# Patient Record
Sex: Male | Born: 1960 | Race: Black or African American | Hispanic: No | Marital: Married | Smoking: Never smoker
Health system: Southern US, Community
[De-identification: ages and names within clinical notes are randomized; demographics above are authoritative.]

## PROBLEM LIST (undated history)

## (undated) DIAGNOSIS — F419 Anxiety disorder, unspecified: Secondary | ICD-10-CM

## (undated) DIAGNOSIS — I10 Essential (primary) hypertension: Secondary | ICD-10-CM

## (undated) DIAGNOSIS — E669 Obesity, unspecified: Secondary | ICD-10-CM

## (undated) DIAGNOSIS — E785 Hyperlipidemia, unspecified: Secondary | ICD-10-CM

## (undated) DIAGNOSIS — E119 Type 2 diabetes mellitus without complications: Secondary | ICD-10-CM

## (undated) DIAGNOSIS — E291 Testicular hypofunction: Secondary | ICD-10-CM

## (undated) DIAGNOSIS — F329 Major depressive disorder, single episode, unspecified: Secondary | ICD-10-CM

## (undated) HISTORY — PX: HERNIA REPAIR: SHX51

## (undated) HISTORY — DX: Hyperlipidemia, unspecified: E78.5

## (undated) HISTORY — DX: Testicular hypofunction: E29.1

## (undated) HISTORY — DX: Essential (primary) hypertension: I10

## (undated) HISTORY — DX: Anxiety disorder, unspecified: F41.9

## (undated) HISTORY — DX: Major depressive disorder, single episode, unspecified: F32.9

## (undated) HISTORY — PX: VASECTOMY: SHX75

---
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
## 2001-06-22 ENCOUNTER — Emergency Department (HOSPITAL_COMMUNITY): Admission: EM | Admit: 2001-06-22 | Discharge: 2001-06-22 | Payer: Self-pay | Admitting: Emergency Medicine

## 2001-07-15 ENCOUNTER — Emergency Department (HOSPITAL_COMMUNITY): Admission: EM | Admit: 2001-07-15 | Discharge: 2001-07-15 | Payer: Self-pay | Admitting: *Deleted

## 2001-07-15 ENCOUNTER — Encounter: Payer: Self-pay | Admitting: *Deleted

## 2002-10-01 ENCOUNTER — Emergency Department (HOSPITAL_COMMUNITY): Admission: EM | Admit: 2002-10-01 | Discharge: 2002-10-01 | Payer: Self-pay | Admitting: Emergency Medicine

## 2003-01-02 ENCOUNTER — Encounter: Payer: Self-pay | Admitting: Emergency Medicine

## 2003-01-02 ENCOUNTER — Emergency Department (HOSPITAL_COMMUNITY): Admission: EM | Admit: 2003-01-02 | Discharge: 2003-01-02 | Payer: Self-pay | Admitting: Emergency Medicine

## 2003-02-26 ENCOUNTER — Emergency Department (HOSPITAL_COMMUNITY): Admission: EM | Admit: 2003-02-26 | Discharge: 2003-02-26 | Payer: Self-pay | Admitting: Emergency Medicine

## 2003-04-08 ENCOUNTER — Ambulatory Visit (HOSPITAL_COMMUNITY): Admission: RE | Admit: 2003-04-08 | Discharge: 2003-04-08 | Payer: Self-pay | Admitting: General Surgery

## 2004-07-09 ENCOUNTER — Ambulatory Visit (HOSPITAL_COMMUNITY): Admission: RE | Admit: 2004-07-09 | Discharge: 2004-07-09 | Payer: Self-pay | Admitting: Family Medicine

## 2005-06-20 DIAGNOSIS — F32A Depression, unspecified: Secondary | ICD-10-CM

## 2005-06-20 HISTORY — DX: Depression, unspecified: F32.A

## 2005-11-18 ENCOUNTER — Emergency Department (HOSPITAL_COMMUNITY): Admission: EM | Admit: 2005-11-18 | Discharge: 2005-11-18 | Payer: Self-pay | Admitting: Emergency Medicine

## 2005-11-21 ENCOUNTER — Emergency Department (HOSPITAL_COMMUNITY): Admission: EM | Admit: 2005-11-21 | Discharge: 2005-11-21 | Payer: Self-pay | Admitting: Emergency Medicine

## 2006-03-24 ENCOUNTER — Emergency Department (HOSPITAL_COMMUNITY): Admission: EM | Admit: 2006-03-24 | Discharge: 2006-03-24 | Payer: Self-pay | Admitting: Emergency Medicine

## 2008-08-21 ENCOUNTER — Emergency Department (HOSPITAL_COMMUNITY): Admission: EM | Admit: 2008-08-21 | Discharge: 2008-08-22 | Payer: Self-pay | Admitting: Emergency Medicine

## 2009-08-18 ENCOUNTER — Ambulatory Visit: Payer: Self-pay | Admitting: Family Medicine

## 2009-08-18 DIAGNOSIS — F418 Other specified anxiety disorders: Secondary | ICD-10-CM | POA: Insufficient documentation

## 2009-08-18 DIAGNOSIS — F324 Major depressive disorder, single episode, in partial remission: Secondary | ICD-10-CM | POA: Insufficient documentation

## 2009-08-18 DIAGNOSIS — E782 Mixed hyperlipidemia: Secondary | ICD-10-CM | POA: Insufficient documentation

## 2009-08-18 DIAGNOSIS — I10 Essential (primary) hypertension: Secondary | ICD-10-CM | POA: Insufficient documentation

## 2009-09-14 ENCOUNTER — Encounter: Payer: Self-pay | Admitting: Physician Assistant

## 2009-09-14 LAB — CONVERTED CEMR LAB
ALT: 44 units/L (ref 0–53)
AST: 31 units/L (ref 0–37)
Albumin: 4.2 g/dL (ref 3.5–5.2)
Alkaline Phosphatase: 101 units/L (ref 39–117)
BUN: 11 mg/dL (ref 6–23)
CO2: 26 meq/L (ref 19–32)
Calcium: 9.3 mg/dL (ref 8.4–10.5)
Chloride: 103 meq/L (ref 96–112)
Cholesterol: 200 mg/dL (ref 0–200)
Creatinine, Ser: 1.05 mg/dL (ref 0.40–1.50)
Glucose, Bld: 112 mg/dL — ABNORMAL HIGH (ref 70–99)
HCT: 41.5 % (ref 39.0–52.0)
HDL: 36 mg/dL — ABNORMAL LOW (ref >39)
Hemoglobin: 13.6 g/dL (ref 13.0–17.0)
LDL Cholesterol: 150 mg/dL — ABNORMAL HIGH (ref 0–99)
MCHC: 32.8 g/dL (ref 30.0–36.0)
MCV: 94.7 fL (ref 78.0–100.0)
PSA: 0.56 ng/mL (ref 0.10–4.00)
PSA: NORMAL ng/mL
Platelets: 322 10*3/uL (ref 150–400)
Potassium: 4.3 meq/L (ref 3.5–5.3)
RBC: 4.38 M/uL (ref 4.22–5.81)
RDW: 12.9 % (ref 11.5–15.5)
Sex Hormone Binding: 25 nmol/L (ref 13–71)
Sodium: 139 meq/L (ref 135–145)
TSH: 2.406 microintl units/mL (ref 0.350–4.500)
Testosterone Free: 84.5 pg/mL (ref 47.0–244.0)
Testosterone-% Free: 2.3 % (ref 1.6–2.9)
Testosterone: 361.66 ng/dL (ref 350–890)
Total Bilirubin: 0.6 mg/dL (ref 0.3–1.2)
Total CHOL/HDL Ratio: 5.6
Total Protein: 7.5 g/dL (ref 6.0–8.3)
Triglycerides: 68 mg/dL (ref <150)
VLDL: 14 mg/dL (ref 0–40)
Vit D, 25-Hydroxy: 13 ng/mL — ABNORMAL LOW (ref 30–89)
WBC: 6.4 10*3/uL (ref 4.0–10.5)

## 2009-09-15 ENCOUNTER — Ambulatory Visit: Payer: Self-pay | Admitting: Family Medicine

## 2009-09-15 DIAGNOSIS — E1165 Type 2 diabetes mellitus with hyperglycemia: Secondary | ICD-10-CM | POA: Insufficient documentation

## 2009-09-15 DIAGNOSIS — E669 Obesity, unspecified: Secondary | ICD-10-CM

## 2009-09-15 DIAGNOSIS — E1169 Type 2 diabetes mellitus with other specified complication: Secondary | ICD-10-CM

## 2009-09-15 DIAGNOSIS — E291 Testicular hypofunction: Secondary | ICD-10-CM

## 2009-12-14 ENCOUNTER — Encounter: Payer: Self-pay | Admitting: Physician Assistant

## 2009-12-14 LAB — CONVERTED CEMR LAB
Hgb A1c MFr Bld: 5.3 % (ref <5.7)
Testosterone: 343.29 ng/dL — ABNORMAL LOW (ref 350–890)

## 2009-12-16 ENCOUNTER — Ambulatory Visit: Payer: Self-pay | Admitting: Family Medicine

## 2009-12-16 LAB — CONVERTED CEMR LAB
HDL: 41 mg/dL (ref >39)
LDL Cholesterol: 171 mg/dL — ABNORMAL HIGH (ref 0–99)
Total CHOL/HDL Ratio: 5.8
Triglycerides: 128 mg/dL (ref <150)

## 2010-02-15 ENCOUNTER — Ambulatory Visit: Payer: Self-pay | Admitting: Family Medicine

## 2010-03-18 ENCOUNTER — Ambulatory Visit: Payer: Self-pay | Admitting: Family Medicine

## 2010-03-19 LAB — CONVERTED CEMR LAB
AST: 24 units/L (ref 0–37)
CO2: 25 meq/L (ref 19–32)
Calcium: 9.4 mg/dL (ref 8.4–10.5)
Creatinine, Ser: 1.01 mg/dL (ref 0.40–1.50)
Glucose, Bld: 118 mg/dL — ABNORMAL HIGH (ref 70–99)
HDL: 44 mg/dL (ref >39)
Sodium: 140 meq/L (ref 135–145)
Total Bilirubin: 0.6 mg/dL (ref 0.3–1.2)
Triglycerides: 75 mg/dL (ref <150)

## 2010-07-02 ENCOUNTER — Ambulatory Visit
Admission: RE | Admit: 2010-07-02 | Discharge: 2010-07-02 | Payer: Self-pay | Source: Home / Self Care | Attending: Family Medicine | Admitting: Family Medicine

## 2010-07-02 ENCOUNTER — Encounter: Payer: Self-pay | Admitting: Family Medicine

## 2010-07-20 NOTE — Assessment & Plan Note (Signed)
Summary: follow up - room 2   Vital Signs:  Patient profile:   50 year old male Height:      68 inches Weight:      189.25 pounds BMI:     28.88 O2 Sat:      100 % on Room air Pulse rate:   98 / minute Resp:     16 per minute BP sitting:   136 / 80  (left arm)  Vitals Entered By: Adella Hare LPN (December 16, 2009 9:08 AM)  Serial Vital Signs/Assessments:  Time      Position  BP       Pulse  Resp  Temp     By                     154/92                         Esperanza Sheets PA  CC: follow-up visit Is Patient Diabetic? No Pain Assessment Patient in pain? no        CC:  follow-up visit.  History of Present Illness: Pt is here today for a check up and to discuss recent lab test results.  Hx of depression.  He feels that it has worsened in the last 2-3 weeks.  Feeling sad and no interest or energy to do anything.  Not sleeping well.  With Xanax only sleeps about 3 hrs. He states he was on a higher dose in the past & could sleep about 7 hrs.  No SI or HI thoughts.  Hx of hypogonadism. Was not able to start testosterone prescription due to expense with copay.  Hx of hyperlipidemia and BP elevation.  He has been trying to eat healthier and exercise more regularly.  He does admit to eating alot of fried fish.     Current Medications (verified): 1)  Vitamin D (Ergocalciferol) 50000 Unit Caps (Ergocalciferol) .... Take 1 Weekly 2)  Testim 1 % Gel (Testosterone) .... Apply 5 Grams ( 1 Pkt) Once Daily As Directed. 3)  Alprazolam 0.5 Mg Tabs (Alprazolam) .... Take 1 Tab At [facility] As Needed For Anxiety & Insomnia 4)  Effexor Xr 150 Mg Xr24h-Cap (Venlafaxine Hcl) .... Take 1 Daily  Allergies (verified): No Known Drug Allergies  Past History:  Past medical history reviewed for relevance to current acute and chronic problems.  Past Medical History: Depression Hyperlipidemia Hypertension Hypogonadism  Review of Systems General:  Complains of fatigue. CV:  Denies chest pain or  discomfort and palpitations. Resp:  Denies cough and shortness of breath. GU:  Complains of decreased libido. Psych:  Complains of depression; denies anxiety, suicidal thoughts/plans, and thoughts /plans of harming others.  Physical Exam  General:  Well-developed,well-nourished,in no acute distress; alert,appropriate and cooperative throughout examination Head:  Normocephalic and atraumatic without obvious abnormalities. No apparent alopecia or balding. Ears:  External ear exam shows no significant lesions or deformities.  Otoscopic examination reveals clear canals, tympanic membranes are intact bilaterally without bulging, retraction, inflammation or discharge. Hearing is grossly normal bilaterally. Nose:  External nasal examination shows no deformity or inflammation. Nasal mucosa are pink and moist without lesions or exudates. Mouth:  Oral mucosa and oropharynx without lesions or exudates.  poor dentition and teeth missing.   Neck:  No deformities, masses, or tenderness noted. Lungs:  Normal respiratory effort, chest expands symmetrically. Lungs are clear to auscultation, no crackles or wheezes. Heart:  Normal rate and regular rhythm.  S1 and S2 normal without gallop, murmur, click, rub or other extra sounds. Cervical Nodes:  No lymphadenopathy noted Psych:  Cognition and judgment appear intact. Alert and cooperative with normal attention span and concentration. No apparent delusions, illusions, hallucinations   Impression & Recommendations:  Problem # 1:  DEPRESSION (ICD-311) Assessment Deteriorated  The following medications were removed from the medication list:    Alprazolam 0.5 Mg Tabs (Alprazolam) .[name]... Take 1 tab at hs as needed for anxiety & insomnia    Effexor Xr 150 Mg Xr24h-cap (Venlafaxine hcl) .[name]... Take 1 daily His updated medication list for this problem includes:    Effexor Xr 75 Mg Xr24h-cap (Venlafaxine hcl) .[name]... Take 3 daily for depression    Alprazolam 1 Mg Tabs  (Alprazolam) .[name]... Take 1 at bedime  Problem # 2:  HYPOGONADISM (ICD-257.2) Assessment: Unchanged  Orders: T-Testosterone; Total [phone])  Problem # 3:  IMPAIRED FASTING GLUCOSE (ICD-790.21) Assessment: Improved  Problem # 4:  HYPERTENSION (ICD-401.9) Assessment: Comment Only  Orders: T-Comprehensive Metabolic Panel ([id number])  BP today: 136/80 Prior BP: 140/82 (09/15/2009)  Labs Reviewed: K+: 4.3 (09/14/2009) Creat: : 1.05 (09/14/2009)   Chol: 200 (09/14/2009)   HDL: 36 (09/14/2009)   LDL: 150 (09/14/2009)   TG: 68 (09/14/2009)  Problem # 5:  HYPERLIPIDEMIA (ICD-272.4) Assessment: Deteriorated  His updated medication list for this problem includes:    Simvastatin 20 Mg Tabs (Simvastatin) .[name]... Take 1 at bedtime for cholesterol  Orders: T-Lipid Profile [phone]) T-Comprehensive Metabolic Panel [phone])  Labs Reviewed: SGOT: 31 (09/14/2009)   SGPT: 44 (09/14/2009)   HDL:36 (09/14/2009)  LDL:150 (09/14/2009)  Chol:200 (09/14/2009)  Trig:68 (09/14/2009)  Complete Medication List: 1)  Simvastatin 20 Mg Tabs (Simvastatin) .... Take 1 at bedtime for cholesterol 2)  Effexor Xr 75 Mg Xr24h-cap (Venlafaxine hcl) .... Take 3 daily for depression 3)  Alprazolam 1 Mg Tabs (Alprazolam) .... Take 1 at bedime 4)  Depo-testosterone 100 Mg/ml Oil (Testosterone cypionate) .... Inject every two to four weeks as directed  Patient Instructions: 1)  Please schedule a follow-up appointment in 2 months. 2)  I have increased your Effexor XR dose to a total of 225 mg a day.  You will need to take 3 of the 75 mg tabs as prescribed. 3)  I have started you on Simvastatin for cholesterol.  Continue to follow a low fat diet. 4)  I have prescribed Depo Testosterone.  You may come to the office with the medication for an injection every 2-4 weeks as discussed. 5)  You will need to have lab work done in 3 months fasting. 6)  You may start over the counter Vitamin D [phone]  international units once daily. Prescriptions: DEPO-TESTOSTERONE 100 MG/ML OIL (TESTOSTERONE CYPIONATE) inject every two to four weeks as directed  #1 vial x 1   Entered by:   Adella Hare LPN   Authorized by:   Esperanza Sheets PA   Signed by:   Adella Hare LPN on 28/41/3244   Method used:   Print then Give to Patient   RxID:   [id number] ALPRAZOLAM 1 MG TABS (ALPRAZOLAM) take 1 at bedime  #30 x 2   Entered and Authorized by:   Esperanza Sheets PA   Signed by:   Esperanza Sheets PA on 12/16/2009   Method used:   Printed then faxed to ...       [facility] [address] ([facility]ail)       [address]       [place]  [place], [place]  [address]       Ph: [phone]       Fax: [phone]   RxID:   [id number] EFFEXOR XR 75 MG XR24H-CAP (VENLAFAXINE HCL) take 3 daily for depression  #90 x 3   Entered and Authorized by:   Esperanza Sheets PA   Signed by:   Esperanza Sheets PA on 12/16/2009   Method used:   Electronically to        [facility] (retail)       [address]       [place], [place]  [address]       Ph: [phone]       Fax: [phone]   RxID:   [id number] SIMVASTATIN 20 MG TABS (SIMVASTATIN) take 1 at bedtime for cholesterol  #30 x 3   Entered and Authorized by:   Esperanza Sheets PA   Signed by:   Esperanza Sheets PA on 12/16/2009   Method used:   Electronically to        [facility] (retail)       [address]       [place], [place]  [address]       Ph: [phone]       Fax: [phone]   RxID:   [id number]

## 2010-07-20 NOTE — Assessment & Plan Note (Signed)
Summary: new patient- room 1   Vital Signs:  Patient profile:   50 year old male Weight:      199.75 pounds O2 Sat:      98 % Pulse rate:   73 / minute Resp:     16 per minute BP sitting:   120 / 82  (left arm)  Vitals Entered By: Adella Hare LPN (August 18, 1476 8:34 AM) CC: new patient Is Patient Diabetic? No Pain Assessment Patient in pain? no        CC:  new patient.  History of Present Illness: Pt here to establish with new PCP.  Has been a few yrs since he has been to the Dr.  Rock Nephew c/o depression.  Has worsened since lost job & death of son few yrs ago.  Financial difficulties.  Was on antidepressents in past & did help.  Denies suiciadal plans.  Has difficulty sleeping.  Probs falling asleep & then awakens during the night.  Doesn't enjoy things that he use to.  Doesn't want to get out of bed in the mornings & this is not like him.  Doesn't want to go anywhere or do anything.  Pt also has a hxof high chol & HTN.  Has been out of meds x few yrs.  Denies chest pain, difficulty breathing,  or palpitations.        Habits & Providers  Alcohol-Tobacco-Diet     Tobacco Status: never  Exercise-Depression-Behavior     Does Patient Exercise: no     Drug Use: no  Current Medications (verified): 1)  None  Allergies (verified): No Known Drug Allergies  Past History:  Past medical, surgical, family and social histories (including risk factors) reviewed, and no changes noted (except as noted below).  Past Medical History: Depression Hyperlipidemia Hypertension  Past Surgical History: Hernia repair  Family History: Reviewed history and no changes required. mother living- HTN, Heart disease father deceased- health status unknown 4 brothers- health status unknown 3 sisters- health status unknown  Social History: Reviewed history and no changes required. Unemployed Married 30 years One child deceased Never Smoked Alcohol use-no Drug use-no Regular  exercise-no Smoking Status:  never Drug Use:  no Does Patient Exercise:  no  Review of Systems General:  Denies fatigue and weakness. CV:  Denies chest pain or discomfort, palpitations, shortness of breath with exertion, and swelling of feet. Resp:  Denies cough and shortness of breath. Psych:  Complains of anxiety, depression, and easily tearful; denies suicidal thoughts/plans, thoughts of violence, and thoughts /plans of harming others.  Physical Exam  General:  Well-developed,well-nourished,in no acute distress; alert,appropriate and cooperative throughout examination Head:  Normocephalic and atraumatic without obvious abnormalities. No apparent alopecia or balding. Ears:  External ear exam shows no significant lesions or deformities.  Otoscopic examination reveals clear canals, tympanic membranes are intact bilaterally without bulging, retraction, inflammation or discharge. Hearing is grossly normal bilaterally. Nose:  External nasal examination shows no deformity or inflammation. Nasal mucosa are pink and moist without lesions or exudates. Mouth:  Oral mucosa and oropharynx without lesions or exudates.  Teeth in good repair. Neck:  No deformities, masses, or tenderness noted. Lungs:  Normal respiratory effort, chest expands symmetrically. Lungs are clear to auscultation, no crackles or wheezes. Heart:  Normal rate and regular rhythm. S1 and S2 normal without gallop, murmur, click, rub or other extra sounds. Abdomen:  Bowel sounds positive,abdomen soft and non-tender without masses, organomegaly or hernias noted. Cervical Nodes:  No lymphadenopathy  noted Psych:  Cognition and judgment appear intact. Alert and cooperative with normal attention span and concentration. No apparent delusions, illusions, hallucinations   Impression & Recommendations:  Problem # 1:  DEPRESSION (ICD-311)  Orders: T-TSH ([id number]) T-Testosterone, Free and Total [phone])  His updated  medication list for this problem includes:    Venlafaxine Hcl 75 Mg Xr24h-tab (Venlafaxine hcl) .[name]... 1/2 tab daily x 6 days, then 1 daily thereafter  Problem # 2:  ESSENTIAL HYPERTENSION, BENIGN (ICD-401.1) Assessment: Improved  Orders: T-Comprehensive Metabolic Panel ([id number])  Problem # 3:  HYPERLIPIDEMIA (ICD-272.4)  Orders: T-Comprehensive Metabolic Panel ([id number]) T-Lipid Profile ([id number])  Complete Medication List: 1)  Venlafaxine Hcl 75 Mg Xr24h-tab (Venlafaxine hcl) .... 1/2 tab daily x 6 days, then 1 daily thereafter  Other Orders: T-CBC No Diff ([id number]) T-PSA ([id number]) T-Vitamin D (25-Hydroxy) (51884-16606)  Patient Instructions: 1)  Follow up appt 4-6 weeks.  Will do a physical at that time. 2)  Have lab work completed before your next appt.  This should be done fasting. 3)  Restart your antidepressants. 4)  We will follow up on your blood pressure.  It was very good today. Prescriptions: VENLAFAXINE HCL 75 MG XR24H-TAB (VENLAFAXINE HCL) 1/2 tab daily x 6 days, then 1 daily thereafter  #30 x 1   Entered and Authorized by:   Esperanza Sheets PA   Signed by:   Esperanza Sheets PA on 08/18/2009   Method used:   Electronically to        [facility] (retail)       [address]       [place], [place]  [address]       Ph: [phone]       Fax: [phone]   RxID:   [id number]

## 2010-07-20 NOTE — Assessment & Plan Note (Signed)
Summary: PHY   Vital Signs:  Patient profile:   50 year old male Height:      68 inches Weight:      193.50 pounds BMI:     29.53 O2 Sat:      98 % Pulse rate:   70 / minute Resp:     16 per minute BP sitting:   140 / 82  (left arm) Cuff size:   large  Vitals Entered By: Everitt Amber LPN (September 15, 2009 8:18 AM)  Nutrition Counseling: Patient's BMI is greater than 25 and therefore counseled on weight management options. CC: Physical, c/o having some problems sleeping (falling asleep)   CC:  Physical and c/o having some problems sleeping (falling asleep).  History of Present Illness: Pt is here today for his physical.  Had labs done & need to review.  Started Venlaxafine after prev visit.  Has noted some improv of sadness/ depression.  Also having probs with sleep.  Diff falling asleep & awakens q 2-3 hrs & hard to fall back asleep. "Toss & turn".  Eye exam > couple yrs Dentist - no Tetnus - uncertain, but yrs ago.  Current Medications (verified): 1)  Venlafaxine Hcl 75 Mg Xr24h-Tab (Venlafaxine Hcl) .... 1/2 Tab Daily X 6 Days, Then 1 Daily Thereafter  Allergies (verified): No Known Drug Allergies  Past History:  Past medical, surgical, family and social histories (including risk factors) reviewed for relevance to current acute and chronic problems.  Past Medical History: Reviewed history from 08/18/2009 and no changes required. Depression Hyperlipidemia Hypertension  Past Surgical History: Reviewed history from 08/18/2009 and no changes required. Hernia repair  Family History: Reviewed history from 08/18/2009 and no changes required. mother living- HTN, Heart disease father deceased- health status unknown 4 brothers- health status unknown 3 sisters- health status unknown  Social History: Reviewed history from 08/18/2009 and no changes required. Unemployed Married 30 years One child deceased Never Smoked Alcohol use-no Drug use-no Regular  exercise-no  Review of Systems General:  Complains of fatigue and sleep disorder. Eyes:  Denies blurring and double vision. ENT:  Denies earache, nasal congestion, postnasal drainage, ringing in ears, sinus pressure, and sore throat. CV:  Denies chest pain or discomfort and palpitations. Resp:  Denies cough and shortness of breath. GI:  Complains of constipation; denies abdominal pain, bloody stools, change in bowel habits, dark tarry stools, diarrhea, indigestion, nausea, and vomiting. GU:  Denies decreased libido, discharge, dysuria, erectile dysfunction, nocturia, and urinary frequency. MS:  Denies joint pain, joint redness, low back pain, and mid back pain. Derm:  Denies rash. Neuro:  Denies headaches, numbness, sensation of room spinning, tremors, and weakness; PT HAS BEEN GETTING TWITCHING IN HIS MUSCLES FOR A FEW MOS.  VARIES LOCATION IN UE & LE BILAT.[name] Psych:  Complains of anxiety, depression, suicidal thoughts/plans, and thoughts /plans of harming others. Endo:  Denies excessive thirst. Allergy:  Denies seasonal allergies.  Physical Exam  General:  Well-developed,well-nourished,in no acute distress; alert,appropriate and cooperative throughout examination Head:  Normocephalic and atraumatic without obvious abnormalities. No apparent alopecia or balding. Ears:  External ear exam shows no significant lesions or deformities.  Otoscopic examination reveals clear canals, tympanic membranes are intact bilaterally without bulging, retraction, inflammation or discharge. Hearing is grossly normal bilaterally. Nose:  External nasal examination shows no deformity or inflammation. Nasal mucosa are pink and moist without lesions or exudates. Mouth:  Oral mucosa and oropharynx without lesions or exudates.  teeth missing.   Neck:  No  deformities, masses, or tenderness noted.no thyromegaly.   Chest Wall:  no deformities and no masses.   Lungs:  Normal respiratory effort, chest expands  symmetrically. Lungs are clear to auscultation, no crackles or wheezes. Heart:  Normal rate and regular rhythm. S1 and S2 normal without gallop, murmur, click, rub or other extra sounds. Abdomen:  Bowel sounds positive,abdomen soft and non-tender without masses, organomegaly or hernias noted. Rectal:  No external abnormalities noted. Normal sphincter tone. No rectal masses or tenderness. Genitalia:  Testes bilaterally descended without nodularity, tenderness or masses. No scrotal masses or lesions. No penis lesions or urethral discharge. Prostate:  Prostate gland firm and smooth, no enlargement, nodularity, tenderness, mass, asymmetry or induration. Msk:  normal ROM, no joint tenderness, and no muscle atrophy.   Pulses:  R posterior tibial normal and L posterior tibial normal.   Extremities:  No clubbing, cyanosis, edema, or deformity noted with normal full range of motion of all joints.   Neurologic:  alert & oriented X3, cranial nerves II-XII intact, strength normal in all extremities, sensation intact to light touch, gait normal, and DTRs symmetrical and normal.   Skin:  Intact without suspicious lesions or rashes Cervical Nodes:  No lymphadenopathy noted Psych:  Cognition and judgment appear intact. Alert and cooperative with normal attention span and concentration. No apparent delusions, illusions, hallucinations   Impression & Recommendations:  Problem # 1:  Preventive Health Care (ICD-V70.0) Encouraged pt to sched appt with dentist & eye dr.  Jeannine Kitten # 2:  HYPERTENSION (ICD-401.9) Assessment: Comment Only Pt not currently on meds.  BP today: 140/82 Prior BP: 120/82 (08/18/2009)  Problem # 3:  HYPERLIPIDEMIA (ICD-272.4) Assessment: New Chol diet info given. Discussed low fat diet, exercise & wt loss.  Problem # 4:  IMPAIRED FASTING GLUCOSE (ICD-790.21) Assessment: New Will recheck labs in4 wks. Discussed diet, exercise & wt loss.  Orders: T- Hemoglobin A1C  ([id number]) Glucose-[facility] ([address]11-91478)  Problem # 5:  HYPOGONADISM (ICD-257.2) Assessment: New  Orders: T-Testosterone; Total [phone])  Problem # 6:  DEPRESSION (ICD-311) Assessment: Improved  The following medications were removed from the medication list:    Venlafaxine Hcl 75 Mg Xr24h-tab (Venlafaxine hcl) .[name]... 1/2 tab daily x 6 days, then 1 daily thereafter His updated medication list for this problem includes:    Alprazolam 0.5 Mg Tabs (Alprazolam) .[name]... Take 1 tab at hs as needed for anxiety & insomnia    Effexor Xr 150 Mg Xr24h-cap (Venlafaxine hcl) .[name]... Take 1 daily  Complete Medication List: 1)  Vitamin D (ergocalciferol) 50000 Unit Caps (Ergocalciferol) .... Take 1 weekly 2)  Testim 1 % Gel (Testosterone) .... Apply 5 grams ( 1 pkt) once daily as directed. 3)  Alprazolam 0.5 Mg Tabs (Alprazolam) .... Take 1 tab at hs as needed for anxiety & insomnia 4)  Effexor Xr 150 Mg Xr24h-cap (Venlafaxine hcl) .... Take 1 daily  Other Orders: Tdap => 60yrs IM [phone]) Admin 1st Vaccine ([id number]) Admin 1st Vaccine [facility]) [phone]) Hemoccult Guaiac-1 spec.(in office) [phone])  Preventive Care Screening  Last Tetanus Booster:    Date:  09/15/2009    Results:  Tdap  PSA:    Date:  09/14/2009    Results:  normal ng/mL   Patient Instructions: 1)  Please schedule a follow-up appointment in 3 months. 2)  It is important that you exercise regularly at least 20 minutes 5 times a week. If you develop chest pain, have severe difficulty breathing, or feel very tired , stop exercising immediately and seek medical  attention. 3)  You need to lose weight. Consider a lower calorie diet and regular exercise.  4)  I havel increased your Venlaxafine dose. 5)  I have refilled Xanax to help you sleep.  As discussed I am hoping that this will only be for the short term 6)  I have prescribed Vitamin D for you to take once a week. 7)  I have prescribed Testosterone cream for you to use once  daily. 8)  Follow a low fat/chol diet.  Handouts were given to you. 9)  Have blood work done in 4-6 weeks fasting.  This is to recheck you blood sugar & testosterone levels. 10)  We will check you chol levels again in approx 3 mos.  I will give you an order for this in the future. 11)  You have received a Tetnus vaccine today. (Tdap) 12)  Please schedule an appt with the dentist and eye dr for evaluation. 13)  If you muscle twitching returns.  I will want to refer you to a neurologist for further evaluation. Prescriptions: EFFEXOR XR 150 MG XR24H-CAP (VENLAFAXINE HCL) take 1 daily  #30 x 5   Entered and Authorized by:   Esperanza Sheets PA   Signed by:   Esperanza Sheets PA on 09/15/2009   Method used:   Electronically to        [facility] (retail)       [address]       [place], [place]  [address]       Ph: [phone]       Fax: [phone]   RxID:   [id number] ALPRAZOLAM 0.5 MG TABS (ALPRAZOLAM) take 1 tab at [facility] as needed for anxiety & insomnia  #30 x 1   Entered and Authorized by:   Esperanza Sheets PA   Signed by:   Esperanza Sheets PA on 09/15/2009   Method used:   Printed then faxed to ...       [facility] (retail)       [address]       [place], [place]  [address]       Ph: [phone]       Fax: [phone]   RxID:   [id number] TESTIM 1 % GEL (TESTOSTERONE) apply 5 grams ( 1 pkt) once daily as directed.  #30 pts x 1   Entered and Authorized by:   Esperanza Sheets PA   Signed by:   Esperanza Sheets PA on 09/15/2009   Method used:   Printed then faxed to ...       [facility] [address] ([facility]       [address]       [place], [place]  [address]       Ph: [phone]       Fax: [phone]   RxID:   [id number] VITAMIN D (ERGOCALCIFEROL) 50000 UNIT CAPS (ERGOCALCIFEROL) take 1 weekly  #4 x 3   Entered by:   Everitt Amber LPN   Authorized by:   Esperanza Sheets PA   Signed by:   Esperanza Sheets PA on 09/15/2009   Method used:   Electronically to        [facility] (retail)       [address]       [place], [place]  [address]       Ph: [phone]       Fax: [phone]   RxID:   [id number]    Tetanus/Td Vaccine    Vaccine Type: Tdap    Site: right deltoid    Mfr: Sanofi Pasteur    Dose: 0.5 ml    Route: IM    Given by: Adella Hare LPN    Exp. Date: 09/25/2011    Lot #: [id number]    VIS given: 05/08/07 version given September 15, 2009.  Preventive Care Screening  Last Tetanus Booster:    Date:  09/15/2009    Results:  Tdap  PSA:    Date:  09/14/2009    Results:  normal ng/mL   Prevention & Chronic Care Immunizations   Influenza vaccine: Not documented   Influenza vaccine due: 02/18/2010    Tetanus booster: 09/15/2009: Tdap    Pneumococcal vaccine: Not documented  Other Screening   PSA: normal  (09/14/2009)   Smoking status: never  (08/18/2009)  Lipids   Total Cholesterol: Not documented   LDL: Not documented   LDL Direct: Not documented   HDL: Not documented   Triglycerides: Not documented    SGOT (AST): Not documented   SGPT (ALT): Not documented   Alkaline phosphatase: Not documented   Total bilirubin: Not documented  Hypertension   Last Blood Pressure: 140 / 82  (09/15/2009)   Serum creatinine: Not documented   Serum potassium Not documented  Self-Management Support :    Hypertension self-management support: Not documented    Lipid self-management support: Not documented    Appended Document: PHY Hemoccult Neg.

## 2010-07-20 NOTE — Assessment & Plan Note (Signed)
Summary: follow up- room 1   Vital Signs:  Patient profile:   50 year old male Height:      68 inches Weight:      190.25 pounds BMI:     29.03 O2 Sat:      100 % on Room air Pulse rate:   63 / minute Resp:     16 per minute BP sitting:   120 / 80  (left arm)  Vitals Entered By: Adella Hare LPN (March 18, 2010 10:40 AM) CC: follow-up visit Is Patient Diabetic? No Pain Assessment Patient in pain? no        CC:  follow-up visit.  History of Present Illness: John [name] presents today for f/u of his HTN.  Started Lisinopril.  Is taking daily without problems. No chest pain or palpitatations.  No HA.  He feels that he is still doing well with his depression and decreased energy. He is still exercising regularly.  Is taking Alprazolam at HS & is sleeping well.  States he had his labs drawn this am. Hx of hyperlipidemia and taking med daily as prescribed.    Current Medications (verified): 1)  Simvastatin 20 Mg Tabs (Simvastatin) .... Take 1 At Bedtime For Cholesterol 2)  Alprazolam 1 Mg Tabs (Alprazolam) .... Take 1 At Bedime 3)  Lisinopril 10 Mg Tabs (Lisinopril) .... Take 1 Daily For Blood Pressure  Allergies (verified): No Known Drug Allergies  Past History:  Past medical history reviewed for relevance to current acute and chronic problems.  Past Medical History: Reviewed history from 12/16/2009 and no changes required. Depression Hyperlipidemia Hypertension Hypogonadism  Review of Systems General:  Denies chills and fever. CV:  Denies chest pain or discomfort, lightheadness, and palpitations. Resp:  Denies cough and shortness of breath. Neuro:  Denies headaches.  Physical Exam  General:  Well-developed,well-nourished,in no acute distress; alert,appropriate and cooperative throughout examination Head:  Normocephalic and atraumatic without obvious abnormalities. No apparent alopecia or balding. Ears:  External ear exam shows no significant lesions or  deformities.  Otoscopic examination reveals clear canals, tympanic membranes are intact bilaterally without bulging, retraction, inflammation or discharge. Hearing is grossly normal bilaterally. Nose:  External nasal examination shows no deformity or inflammation. Nasal mucosa are pink and moist without lesions or exudates. Mouth:  Oral mucosa and oropharynx without lesions or exudates.  edentulous.   Neck:  No deformities, masses, or tenderness noted. Lungs:  Normal respiratory effort, chest expands symmetrically. Lungs are clear to auscultation, no crackles or wheezes. Heart:  Normal rate and regular rhythm. S1 and S2 normal without gallop, murmur, click, rub or other extra sounds. Cervical Nodes:  No lymphadenopathy noted Psych:  Cognition and judgment appear intact. Alert and cooperative with normal attention span and concentration. No apparent delusions, illusions, hallucinations   Impression & Recommendations:  Problem # 1:  HYPERTENSION (ICD-401.9) Assessment Improved  His updated medication list for this problem includes:    Lisinopril 10 Mg Tabs (Lisinopril) .[name]... Take 1 daily for blood pressure  BP today: 120/80 Prior BP: 148/80 (02/15/2010)  Labs Reviewed: K+: 4.3 (09/14/2009) Creat: : 1.05 (09/14/2009)   Chol: 238 (12/14/2009)   HDL: 41 (12/14/2009)   LDL: 171 (12/14/2009)   TG: 128 (12/14/2009)  Problem # 2:  DEPRESSION (ICD-311) Assessment: Comment Only  His updated medication list for this problem includes:    Alprazolam 1 Mg Tabs (Alprazolam) .[name]... Take 1 at bedime  Problem # 3:  HYPERLIPIDEMIA (ICD-272.4) Assessment: Comment Only  His updated medication list for  this problem includes:    Simvastatin 20 Mg Tabs (Simvastatin) .[name]... Take 1 at bedtime for cholesterol  Complete Medication List: 1)  Simvastatin 20 Mg Tabs (Simvastatin) .... Take 1 at bedtime for cholesterol 2)  Alprazolam 1 Mg Tabs (Alprazolam) .... Take 1 at bedime 3)  Lisinopril 10 Mg Tabs  (Lisinopril) .... Take 1 daily for blood pressure  Other Orders: Influenza Vaccine MCR ([id number])  Patient Instructions: 1)  Please schedule a follow-up appointment in 3 months. 2)  I have refilled your medications for you. 3)  Continue exercising regularly. Prescriptions: ALPRAZOLAM 1 MG TABS (ALPRAZOLAM) take 1 at bedime  #30 x 2   Entered and Authorized by:   Esperanza Sheets PA   Signed by:   Esperanza Sheets PA on 03/18/2010   Method used:   Printed then faxed to ...       [facility]       [address]       [place], [place]  [address]       Ph: [phone]       Fax: [phone]   RxID:   [id number] SIMVASTATIN 20 MG TABS (SIMVASTATIN) take 1 at bedtime for cholesterol  #30 x 3   Entered and Authorized by:   Esperanza Sheets PA   Signed by:   Esperanza Sheets PA on 03/18/2010   Method used:   Electronically to        [facility] (retail)       [address]       [place], [place]  [address]       Ph: [phone]       Fax: [phone]   RxID:   [id number] LISINOPRIL 10 MG TABS (LISINOPRIL) take 1 daily for blood pressure  #30 Tablet x 5   Entered and Authorized by:   Esperanza Sheets PA   Signed by:   Esperanza Sheets PA on 03/18/2010   Method used:   Electronically to        [facility] (retail)       [address]       [place], [place]  [address]       Ph: [phone]       Fax: [phone]   RxID:   [id number]    Influenza Vaccine    Vaccine Type: Fluvax MCR    Site: right deltoid    Mfr: novartis    Dose: 0.5 ml    Route: IM    Given by: Adella Hare LPN    Exp. Date: 10/2010    Lot #: [id number]    VIS given: 01/12/10 version given March 18, 2010.

## 2010-07-20 NOTE — Assessment & Plan Note (Signed)
Summary: follow up- room 2   Vital Signs:  Patient profile:   50 year old male Height:      68 inches Weight:      190.25 pounds BMI:     29.03 O2 Sat:      100 % on Room air Pulse rate:   88 / minute Resp:     16 per minute BP sitting:   148 / 80  (left arm)  Vitals Entered By: Adella Hare LPN (February 15, 2010 10:10 AM)  Serial Vital Signs/Assessments:  Time      Position  BP       Pulse  Resp  Temp     By                     172/94                         Esperanza Sheets PA  CC: follow-up visit Is Patient Diabetic? No Pain Assessment Patient in pain? no      Comments did not bring meds to ov   CC:  follow-up visit.  History of Present Illness: Pt presents today for check up.  He states that he if feeling much better physically and emotionally.  He had his teeth pulled and this helped alot.  He is waiting for dentures. Not sure where or when he will be able to get these.  He states he is unable to afford the Effexor so he stopped it.  But feels that his depression is much better. States he is staying active, and sleeping well. Uses Alprazolam at night to help with sleep.  Exercises daily.  Pt was unable to get Testosterone cream or Depotestosterone inj due to insurance.  States his energy is better now and he wishes to wait on this.  Hx of BP elevation and hyperlipidemia.  Is taking his chol med as prescribed.  Due in one mos for f/u labs.  No prev BP meds, was tring lifestyle modification.      Allergies (verified): No Known Drug Allergies  Past History:  Past medical history reviewed for relevance to current acute and chronic problems.  Past Medical History: Reviewed history from 12/16/2009 and no changes required. Depression Hyperlipidemia Hypertension Hypogonadism  Review of Systems General:  Denies chills, fever, and loss of appetite. ENT:  Denies earache and nasal congestion. CV:  Denies chest pain or discomfort and palpitations. Resp:  Denies  cough. Psych:  Denies anxiety, depression, suicidal thoughts/plans, and thoughts /plans of harming others.  Physical Exam  General:  Well-developed,well-nourished,in no acute distress; alert,appropriate and cooperative throughout examination Head:  Normocephalic and atraumatic without obvious abnormalities. No apparent alopecia or balding. Ears:  External ear exam shows no significant lesions or deformities.  Otoscopic examination reveals clear canals, tympanic membranes are intact bilaterally without bulging, retraction, inflammation or discharge. Hearing is grossly normal bilaterally. Nose:  External nasal examination shows no deformity or inflammation. Nasal mucosa are pink and moist without lesions or exudates. Mouth:  Oral mucosa and oropharynx without lesions or exudates.  edentulous.   Neck:  No deformities, masses, or tenderness noted. Lungs:  Normal respiratory effort, chest expands symmetrically. Lungs are clear to auscultation, no crackles or wheezes. Heart:  Normal rate and regular rhythm. S1 and S2 normal without gallop, murmur, click, rub or other extra sounds.   Impression & Recommendations:  Problem # 1:  HYPERTENSION (ICD-401.9) Assessment Deteriorated  His updated medication  list for this problem includes:    Lisinopril 10 Mg Tabs (Lisinopril) .[name]... Take 1 daily for blood pressure  Orders: T-Comprehensive Metabolic Panel ([id number])  Problem # 2:  DEPRESSION (ICD-311) Assessment: Improved  The following medications were removed from the medication list:    Effexor Xr 75 Mg Xr24h-cap (Venlafaxine hcl) .[name]... Take 3 daily for depression His updated medication list for this problem includes:    Alprazolam 1 Mg Tabs (Alprazolam) .[name]... Take 1 at bedime  Problem # 3:  HYPERLIPIDEMIA (ICD-272.4) Assessment: Comment Only  His updated medication list for this problem includes:    Simvastatin 20 Mg Tabs (Simvastatin) .[name]... Take 1 at bedtime for  cholesterol  Orders: T-Comprehensive Metabolic Panel [id number]) T-Lipid Profile ([id number])  Problem # 4:  HYPOGONADISM (ICD-257.2) Assessment: Comment Only No current treatment due to ins coverage issues. Pt declines current treatment.  Complete Medication List: 1)  Simvastatin 20 Mg Tabs (Simvastatin) .... Take 1 at bedtime for cholesterol 2)  Alprazolam 1 Mg Tabs (Alprazolam) .... Take 1 at bedime 3)  Lisinopril 10 Mg Tabs (Lisinopril) .... Take 1 daily for blood pressure  Patient Instructions: 1)  Please schedule a follow-up appointment in 1 month. 2)  I have prescribed Lisinopril for your blood pressure. 3)  I have ordered blood work.  Have this drawn fasting in 1 mos before your next appt. 4)  Continue taking your cholesterol medication. Prescriptions: LISINOPRIL 10 MG TABS (LISINOPRIL) take 1 daily for blood pressure  #30 x 0   Entered and Authorized by:   Esperanza Sheets PA   Signed by:   Esperanza Sheets PA on 02/15/2010   Method used:   Electronically to        [facility] (retail)       [address]       [place], [place]  [address]       Ph: [phone]       Fax: [phone]   RxID:   [id number]

## 2010-07-22 NOTE — Assessment & Plan Note (Signed)
Summary: f up   Vital Signs:  Patient profile:   50 year old male Height:      68 inches Weight:      198.75 pounds BMI:     30.33 O2 Sat:      97 % on Room air Pulse rate:   82 / minute Pulse rhythm:   regular Resp:     16 per minute BP sitting:   170 / 100  (left arm) Cuff size:   large  Vitals Entered By: Everitt Amber LPN (July 02, 2010 11:10 AM)  Nutrition Counseling: Patient's BMI is greater than 25 and therefore counseled on weight management options.  O2 Flow:  Room air CC: Follow up chronic problems   CC:  Follow up chronic problems.  History of Present Illness: Reports  that he has been doing well, he is here for f/u and to establishe care with me Denies recent fever or chills. Denies sinus pressure, nasal congestion , ear pain or sore throat. Denies chest congestion, or cough productive of sputum. Denies chest pain, palpitations, PND, orthopnea or leg swelling. Denies abdominal pain, nausea, vomitting, diarrhea or constipation. Denies change in bowel movements or bloody stool. Denies dysuria , frequency, incontinence or hesitancy. Denies  joint pain, swelling, or reduced mobility. Denies headaches, vertigo, seizures. Denies depression, anxiety or insomnia. Denies  rash, lesions, or itch.     Current Medications (verified): 1)  Simvastatin 20 Mg Tabs (Simvastatin) .... Take 1 At Bedtime For Cholesterol 2)  Alprazolam 1 Mg Tabs (Alprazolam) .... Take 1 At Bedime 3)  Lisinopril 10 Mg Tabs (Lisinopril) .... Take 1 Daily For Blood Pressure  Allergies (verified): No Known Drug Allergies  Past History:  Past medical, surgical, family and social histories (including risk factors) reviewed for relevance to current acute and chronic problems.  Past Medical History: Depression  since 2007 Hyperlipidemia  since 2011 Hypertension  since 2011 Hypogonadism  Past Surgical History: Reviewed history from 08/18/2009 and no changes required. Hernia  repair  Family History: Reviewed history from 08/18/2009 and no changes required. mother living- HTN, Heart disease father deceased- health status unknown 4 brothers- health status unknown 3 sisters- health status unknown  Social History: Reviewed history from 08/18/2009 and no changes required. employed in the [employer] x 3 months Married 30 years One child deceased at age 14, unknown cause, since 2007  Never Smoked Alcohol use-no Drug use-no Regular exercise-no  Review of Systems      See HPI General:  Complains of fatigue. Eyes:  Denies discharge, eye pain, and red eye. MS:  Denies joint pain, low back pain, mid back pain, and stiffness. Endo:  Denies cold intolerance, excessive hunger, excessive thirst, and excessive urination. Heme:  Denies abnormal bruising and bleeding. Allergy:  Denies hives or rash and itching eyes.  Physical Exam  General:  Well-developed,well-nourished,in no acute distress; alert,appropriate and cooperative throughout examination HEENT: No facial asymmetry,  EOMI, No sinus tenderness, TM's Clear, oropharynx  pink and moist.   Chest: Clear to auscultation bilaterally.  CVS: S1, S2, No murmurs, No S3.   Abd: Soft, Nontender.  MS: Adequate ROM spine, hips, shoulders and knees.  Ext: No edema.   CNS: CN 2-12 intact, power tone and sensation normal throughout.   Skin: Intact, no visible lesions or rashes.  Psych: Good eye contact, normal affect.  Memory intact, not anxious or depressed appearing.    Impression & Recommendations:  Problem # 1:  IMPAIRED FASTING GLUCOSE (ICD-790.21) Assessment Comment Only  Orders: T- Hemoglobin A1C ([id number]) Pt advised to reduce carbohydrate intake, espescially sweets, and to start regular physical activity, at least 30 minutes 5 days weekly, to enable weight loss, and reduce the risk of becoming diabetic    Labs Reviewed: Creat: 1.01 (03/18/2010)     Problem # 2:  HYPERTENSION  (ICD-401.9) Assessment: Deteriorated  The following medications were removed from the medication list:    Lisinopril 10 Mg Tabs (Lisinopril) .[name]... Take 1 daily for blood pressure His updated medication list for this problem includes:    Triamterene-hctz 37.5-25 Mg Tabs (Triamterene-hctz) .[name]... Take 1 tablet by mouth once a day Patient advised to follow low sodium diet rich in fruit and vegetables, and to commit to at least 30 minutes 5 days per week of regular exercise , to improve blood presure control.   BP today: 170/100 Prior BP: 120/80 (03/18/2010)  Labs Reviewed: K+: 4.5 (03/18/2010) Creat: : 1.01 (03/18/2010)   Chol: 185 (03/18/2010)   HDL: 44 (03/18/2010)   LDL: 126 (03/18/2010)   TG: 75 (03/18/2010)  Problem # 3:  HYPERLIPIDEMIA (ICD-272.4) Assessment: Comment Only  His updated medication list for this problem includes:    Simvastatin 20 Mg Tabs (Simvastatin) .[name]... Take 1 at bedtime for cholesterol Low fat dietdiscussed and encouraged  Orders: T-Hepatic Function [id number]) T-Lipid Profile [id number])  Labs Reviewed: SGOT: 24 (03/18/2010)   SGPT: 46 (03/18/2010)   HDL:44 (03/18/2010), 41 (12/14/2009)  LDL:126 (03/18/2010), 171 (12/14/2009)  Chol:185 (03/18/2010), 238 (12/14/2009)  Trig:75 (03/18/2010), 128 (12/14/2009)  Complete Medication List: 1)  Simvastatin 20 Mg Tabs (Simvastatin) .... Take 1 at bedtime for cholesterol 2)  Alprazolam 1 Mg Tabs (Alprazolam) .... Take 1 at bedime 3)  Triamterene-hctz 37.5-25 Mg Tabs (Triamterene-hctz) .... Take 1 tablet by mouth once a day 4)  Fluoxetine Hcl 10 Mg Caps (Fluoxetine hcl) .... Take 1 capsule by mouth once a day  Other Orders: T-Basic Metabolic Panel [id number])  Patient Instructions: 1)  f/U 5 weeks 2)  bP is too high. 3)  pls stop lisinopril effective tomorrow. 4)  New med for bp start today, is sent to your pharmacy. 5)  pls follow a low fat diet. 6)  new med for depression 7)  BMP prior to visit,  ICD-9: 8)  Hepatic Panel prior to visit, ICD-9: 9)  Lipid Panel prior to visit, ICD-9:  fasting today 10)  HbgA1C prior to visit, ICD-9: 11)  You will be given the dASH diet, pls try and follow Prescriptions: FLUOXETINE HCL 10 MG CAPS (FLUOXETINE HCL) Take 1 capsule by mouth once a day  #30 x 2   Entered and Authorized by:   Syliva Overman MD   Signed by:   Syliva Overman MD on 07/02/2010   Method used:   Printed then faxed to ...       [facility] (retail)       [address]       [place], [place]  [address]       Ph: [phone]       Fax: [phone]   RxID:   [id number] TRIAMTERENE-HCTZ 37.5-25 MG TABS (TRIAMTERENE-HCTZ) Take 1 tablet by mouth once a day  #30 x 1   Entered and Authorized by:   Syliva Overman MD   Signed by:   Syliva Overman MD on 07/02/2010   Method used:   Electronically to        [facility] (retail)  [address]       [place], [place]  [address]       Ph: [phone]       Fax: [phone]   RxID:   [id number]    Orders Added: 1)  Est. Patient Level IV [[address]] 2)  T-Basic Metabolic Panel [id number] 3)  T-Hepatic Function [[id number]] 4)  T-Lipid Profile [[id number]] 5)  T- Hemoglobin A1C [[id number]]

## 2010-07-22 NOTE — Letter (Signed)
Summary: D/C [facility]  D/C [facility]   Imported By: Lind Guest 07/05/2010 15:02:35  _____________________________________________________________________  External Attachment:    Type:   Image     Comment:   External Document

## 2010-08-11 ENCOUNTER — Encounter: Payer: Self-pay | Admitting: Family Medicine

## 2010-08-11 ENCOUNTER — Ambulatory Visit: Payer: Self-pay | Admitting: Family Medicine

## 2010-08-12 LAB — CONVERTED CEMR LAB
BUN: 16 mg/dL (ref 6–23)
Chloride: 98 meq/L (ref 96–112)
Cholesterol: 202 mg/dL — ABNORMAL HIGH (ref 0–200)
Glucose, Bld: 95 mg/dL (ref 70–99)
Indirect Bilirubin: 0.8 mg/dL (ref 0.0–0.9)
LDL Cholesterol: 137 mg/dL — ABNORMAL HIGH (ref 0–99)
Potassium: 4.6 meq/L (ref 3.5–5.3)
Sodium: 138 meq/L (ref 135–145)
Total CHOL/HDL Ratio: 4.4

## 2010-08-17 NOTE — Letter (Signed)
Summary: 1st no show  1st no show   Imported By: Lind Guest 08/12/2010 07:55:29  _____________________________________________________________________  External Attachment:    Type:   Image     Comment:   External Document

## 2010-08-25 ENCOUNTER — Ambulatory Visit (INDEPENDENT_AMBULATORY_CARE_PROVIDER_SITE_OTHER): Payer: PRIVATE HEALTH INSURANCE | Admitting: Family Medicine

## 2010-08-25 ENCOUNTER — Encounter: Payer: Self-pay | Admitting: Family Medicine

## 2010-08-25 DIAGNOSIS — I10 Essential (primary) hypertension: Secondary | ICD-10-CM

## 2010-08-25 DIAGNOSIS — F329 Major depressive disorder, single episode, unspecified: Secondary | ICD-10-CM

## 2010-08-25 DIAGNOSIS — E785 Hyperlipidemia, unspecified: Secondary | ICD-10-CM

## 2010-09-07 NOTE — Assessment & Plan Note (Signed)
Summary: office visit   Vital Signs:  Patient profile:   51 year old male Height:      68 inches Weight:      193 pounds BMI:     29.45 O2 Sat:      97 % Pulse rate:   80 / minute Pulse rhythm:   regular Resp:     16 per minute BP sitting:   130 / 92  (left arm) Cuff size:   large  Vitals Entered By: Everitt Amber LPN (August 24, 1608 9:06 AM)  Nutrition Counseling: Patient's BMI is greater than 25 and therefore counseled on weight management options. CC: Follow up chronic problems   Primary Care Provider:  Syliva Overman MD  CC:  Follow up chronic problems.  History of Present Illness: Reports  that he is doing well. he has had no adverse side effects on his bP med, and reports feelingbetter overall Denies recent fever or chills. Denies sinus pressure, nasal congestion , ear pain or sore throat. Denies chest congestion, or cough productive of sputum. Denies chest pain, palpitations, PND, orthopnea or leg swelling. Denies abdominal pain, nausea, vomitting, diarrhea or constipation. Denies change in bowel movements or bloody stool. Denies dysuria , frequency, incontinence or hesitancy. Denies  joint pain, swelling, or reduced mobility. Denies headaches, vertigo, seizures. Denies depression, anxiety or insomnia. Denies  rash, lesions, or itch.     Current Medications (verified): 1)  Simvastatin 20 Mg Tabs (Simvastatin) .... Take 1 At Bedtime For Cholesterol 2)  Alprazolam 1 Mg Tabs (Alprazolam) .... Take 1 At Bedime 3)  Triamterene-Hctz 37.5-25 Mg Tabs (Triamterene-Hctz) .... Take 1 Tablet By Mouth Once A Day 4)  Fluoxetine Hcl 10 Mg Caps (Fluoxetine Hcl) .... Take 1 Capsule By Mouth Once A Day  Allergies (verified): No Known Drug Allergies  Review of Systems      See HPI General:  Denies chills, fatigue, and fever. Eyes:  Denies discharge, eye pain, and red eye. CV:  Denies chest pain or discomfort, difficulty breathing while lying down, lightheadness,  palpitations, shortness of breath with exertion, and swelling of feet. Psych:  Complains of anxiety and depression; denies mental problems, suicidal thoughts/plans, thoughts of violence, and unusual visions or sounds; improved on medications. Endo:  Denies cold intolerance, excessive hunger, excessive thirst, and excessive urination. Heme:  Denies abnormal bruising, bleeding, enlarge lymph nodes, and fevers. Allergy:  Denies hives or rash, itching eyes, persistent infections, and seasonal allergies.  Physical Exam  General:  Well-developed,well-nourished,in no acute distress; alert,appropriate and cooperative throughout examination HEENT: No facial asymmetry,  EOMI, No sinus tenderness, TM's Clear, oropharynx  pink and moist.   Chest: Clear to auscultation bilaterally.  CVS: S1, S2, No murmurs, No S3.   Abd: Soft, Nontender.  MS: Adequate ROM spine, hips, shoulders and knees.  Ext: No edema.   CNS: CN 2-12 intact, power tone and sensation normal throughout.   Skin: Intact, no visible lesions or rashes.  Psych: Good eye contact, normal affect.  Memory intact, not anxious or depressed appearing.    Impression & Recommendations:  Problem # 1:  HYPERTENSION (ICD-401.9) Assessment Improved  His updated medication list for this problem includes:    Triamterene-hctz 37.5-25 Mg Tabs (Triamterene-hctz) .[name]... Take 1 tablet by mouth once a day Patient advised to follow low sodium diet rich in fruit and vegetables, and to commit to at least 30 minutes 5 days per week of regular exercise , to improve blood presure control.   Orders: EKG  w/ Interpretation (93000)  BP today: 130/92 Prior BP: 170/100 (07/02/2010)  Labs Reviewed: K+: 4.6 (08/10/2010) Creat: : 1.14 (08/10/2010)   Chol: 202 (08/10/2010)   HDL: 46 (08/10/2010)   LDL: 137 (08/10/2010)   TG: 96 (08/10/2010)  Problem # 2:  DEPRESSION (ICD-311) Assessment: Improved  His updated medication list for this problem includes:     Alprazolam 1 Mg Tabs (Alprazolam) .[name]... Take 1 at bedime    Fluoxetine Hcl 10 Mg Caps (Fluoxetine hcl) .[name]... Take 1 capsule by mouth once a day  Problem # 3:  IMPAIRED FASTING GLUCOSE (ICD-790.21) Assessment: Deteriorated Pt advised to reduce carbohydrate intake, espescially sweets, and to start regular physical activity, at least 30 minutes 5 days weekly, to enable weight loss, and reduce the risk of becoming diabetic  hBa1c 5.7  Problem # 4:  HYPERLIPIDEMIA (ICD-272.4) Assessment: Deteriorated  The following medications were removed from the medication list:    Simvastatin 20 Mg Tabs (Simvastatin) .[name]... Take 1 at bedtime for cholesterol His updated medication list for this problem includes:    Pravastatin Sodium 80 Mg Tabs (Pravastatin sodium) .[name]... Take 1 tab by mouth at bedtime stop the simvastatin once completed Low fat dietdiscussed and encouraged  Orders: T-Lipid Profile ([id number]) T-Hepatic Function [phone])  Labs Reviewed: SGOT: 32 (08/10/2010)   SGPT: 47 (08/10/2010)   HDL:46 (08/10/2010), 44 (03/18/2010)  LDL:137 (08/10/2010), 126 (03/18/2010)  Chol:202 (08/10/2010), 185 (03/18/2010)  Trig:96 (08/10/2010), 75 (03/18/2010)  Complete Medication List: 1)  Alprazolam 1 Mg Tabs (Alprazolam) .... Take 1 at bedime 2)  Triamterene-hctz 37.5-25 Mg Tabs (Triamterene-hctz) .... Take 1 tablet by mouth once a day 3)  Fluoxetine Hcl 10 Mg Caps (Fluoxetine hcl) .... Take 1 capsule by mouth once a day 4)  Pravastatin Sodium 80 Mg Tabs (Pravastatin sodium) .... Take 1 tab by mouth at bedtime stop the simvastatin once completed  Other Orders: T-Basic Metabolic Panel [phone]) T-PSA [phone]) T-Vitamin D (25-Hydroxy) ([phone]) Gastroenterology Referral (GI)  Patient Instructions: 1)  CPE in 4 months. 2)  Congrats omn increased exercise and weight loss. 3)  Your bP is better, and i am happy you feel better. 4)  Stop simvastatin when next due, new med for  cholesterol , pravastatin. 5)  BMP prior to visit, ICD-9: 6)  Hepatic Panel prior to visit, ICD-9: 7)  Lipid Panel prior to visit, ICD-9:   fasting in 3.5 months. 8)  PSA prior to visit, ICD-9: 9)  Vitamin D 10)  You are being referred for a colonscopy Prescriptions: PRAVASTATIN SODIUM 80 MG TABS (PRAVASTATIN SODIUM) Take 1 tab by mouth at bedtime stop the simvastatin once completed  #30 x 3   Entered and Authorized by:   Syliva Overman MD   Signed by:   Syliva Overman MD on 08/25/2010   Method used:   Printed then faxed to ...       [facility] (retail)       [address]       [place], [place]  [address]       Ph: [phone]       Fax: [phone]   RxID:   [id number]    Orders Added: 1)  Est. Patient Level IV [99214] 2)  T-Basic Metabolic Panel [phone] 3)  T-Lipid Profile [[id number]] 4)  T-Hepatic Function [[id number]] 5)  T-PSA [[id number]] 6)  T-Vitamin D (25-Hydroxy) [phone] 7)  EKG w/ Interpretation [93000] 8)  Gastroenterology Referral [GI]

## 2010-09-13 ENCOUNTER — Other Ambulatory Visit: Payer: Self-pay | Admitting: Family Medicine

## 2010-09-26 ENCOUNTER — Other Ambulatory Visit: Payer: Self-pay | Admitting: Family Medicine

## 2010-09-30 LAB — DIFFERENTIAL
Basophils Absolute: 0 10*3/uL (ref 0.0–0.1)
Basophils Relative: 0 % (ref 0–1)
Monocytes Absolute: 0.6 10*3/uL (ref 0.1–1.0)
Monocytes Relative: 8 % (ref 3–12)
Neutro Abs: 3.9 10*3/uL (ref 1.7–7.7)

## 2010-09-30 LAB — CBC
HCT: 40.9 % (ref 39.0–52.0)
Hemoglobin: 13.9 g/dL (ref 13.0–17.0)
MCV: 94.6 fL (ref 78.0–100.0)
Platelets: 288 10*3/uL (ref 150–400)
WBC: 7.1 10*3/uL (ref 4.0–10.5)

## 2010-09-30 LAB — BASIC METABOLIC PANEL
BUN: 7 mg/dL (ref 6–23)
Calcium: 9.6 mg/dL (ref 8.4–10.5)
Chloride: 102 mEq/L (ref 96–112)
Creatinine, Ser: 0.83 mg/dL (ref 0.4–1.5)
GFR calc Af Amer: >60 mL/min (ref >60)
Glucose, Bld: 106 mg/dL — ABNORMAL HIGH (ref 70–99)
Sodium: 138 mEq/L (ref 135–145)

## 2010-09-30 LAB — URINALYSIS, ROUTINE W REFLEX MICROSCOPIC
Hgb urine dipstick: NEGATIVE
Protein, ur: NEGATIVE mg/dL

## 2010-11-05 NOTE — Op Note (Signed)
   NAME:  John [name], John [name]                         ACCOUNT NO.:  [id number]   MEDICAL RECORD NO.:  [id number]                   PATIENT TYPE:  AMB   LOCATION:  DAY                                  FACILITY:  [facility]   PHYSICIAN:  Jerolyn Shin C. Katrinka Blazing, M.D.                DATE OF BIRTH:  [birth year]   DATE OF PROCEDURE:  DATE OF DISCHARGE:                                 OPERATIVE REPORT   PREOPERATIVE DIAGNOSIS:  Incarcerated umbilical hernia.   POSTOPERATIVE DIAGNOSIS:  Incarcerated umbilical hernia.   PROCEDURE:  Umbilical hernia repair.   SURGEON:  Dirk Dress. Katrinka Blazing, M.D.   DESCRIPTION OF PROCEDURE:  Under general LMA anesthesia, the abdomen was  prepped and draped in the a sterile field.   An infraumbilical curvilinear incision was made.  The incision was extended  down to the hernia sac.  Using blunt dissection, the sac was dissected from  the surrounding tissue circumferentially.  The sac was then dissected from  the overlying skin and subcutaneous tissue.  The edges of the sac were  incised, and the fat that was in the sac and the sac was then pushed into  the peritoneal cavity.  The fascial edges were very good.  The fascia was  closed transversely using interrupted sutures of 0 Prolene.  About six  suture ties were made.  This was felt to be a good closure.  There was not a  lot of tension.  The umbilical skin and subcutaneous tissue was then sutured  down to the fascia with 3-0 Biosyn.  The dead space was closed with  interrupted 3-0 Biosyn.  The skin was closed with subcuticular 4-0 Vicryl.  The wound was locally infiltrated with 0.5% Marcaine.  A cotton ball was  placed in the umbilicus and was covered with 4 x 4 and OpSite.   The patient tolerated the procedure well.  He was awakened from anesthesia  unremarkable, transferred to a bed, and taken to the postanesthetic care  unit.      ___________________________________________                                            Dirk Dress. Katrinka Blazing, M.D.   LCS/MEDQ  D:  04/08/2003  T:  04/08/2003  Job:  [id number]

## 2010-11-05 NOTE — H&P (Signed)
   NAME:  John [name], John [name]                         ACCOUNT NO.:  [id number]   MEDICAL RECORD NO.:  [id number]                   PATIENT TYPE:  AMB   LOCATION:  DAY                                  FACILITY:  [facility]   PHYSICIAN:  Jerolyn Shin C. Katrinka Blazing, M.D.                DATE OF BIRTH:  10/10/1960   DATE OF ADMISSION:  DATE OF DISCHARGE:                                HISTORY & PHYSICAL   HISTORY OF PRESENT ILLNESS:  [age]-year-old male with history of  gradual enlargement of an umbilical hernia.  He has had significant  tenderness over the past four months.  He now has tenderness with simple  touching of the area.  He has an obvious incarceration of an umbilical  hernia and is scheduled for repair.   PAST HISTORY:  Past history is positive for hypertension, panic attacks and  hyperlipidemia.   SURGERY:  None.   MEDICATIONS:  1. Xanax 1 mg nightly.  2. Terazosin 5 mg daily.  3. Hyzaar 50/12.5 mg daily.   REVIEW OF SYSTEMS:  Review of systems is negative otherwise.   PHYSICAL EXAMINATION:  VITAL SIGNS:  Blood pressure 150/90, pulse 66,  respirations 16, weight 193 pounds.  HEENT:  Unremarkable.  NECK:  Neck is supple.  CHEST:  Chest clear.  HEART:  Regular rate and rhythm without murmur, gallop or rub.  ABDOMEN:  Abdomen soft and nontender except for a large umbilical hernia  with suspected incarcerated peritoneal fat with significant tenderness.  There is no erythema.  There are no hyperactive bowel sounds.  EXTREMITIES:  No cyanosis, clubbing or edema.  NEUROLOGIC:  No focal deficits.   IMPRESSION:  1. Umbilical hernia with incarceration.  2. Hypertension.  3.     History of panic disorder.  4. Hyperlipidemia.   PLAN:  Umbilical hernia repair.    ___________________________________________                                         Dirk Dress. Katrinka Blazing, M.D.   LCS/MEDQ  D:  04/08/2003  T:  04/08/2003  Job:  [id number]

## 2010-11-24 ENCOUNTER — Other Ambulatory Visit: Payer: Self-pay | Admitting: Family Medicine

## 2010-11-24 LAB — BASIC METABOLIC PANEL
CO2: 32 mEq/L (ref 19–32)
Chloride: 98 mEq/L (ref 96–112)
Potassium: 4.3 mEq/L (ref 3.5–5.3)
Sodium: 137 mEq/L (ref 135–145)

## 2010-11-24 LAB — HEPATIC FUNCTION PANEL
ALT: 54 U/L — ABNORMAL HIGH (ref 0–53)
Albumin: 4.6 g/dL (ref 3.5–5.2)
Alkaline Phosphatase: 68 U/L (ref 39–117)
Indirect Bilirubin: 0.7 mg/dL (ref 0.0–0.9)
Total Bilirubin: 0.9 mg/dL (ref 0.3–1.2)

## 2010-11-24 LAB — LIPID PANEL: LDL Cholesterol: 164 mg/dL — ABNORMAL HIGH (ref 0–99)

## 2010-11-24 LAB — PSA: PSA: 0.56 ng/mL (ref <=4.00)

## 2010-11-26 LAB — HEPATITIS PANEL, ACUTE
HCV Ab: NEGATIVE
Hep A IgM: NEGATIVE

## 2010-12-29 ENCOUNTER — Encounter: Payer: Self-pay | Admitting: Family Medicine

## 2010-12-29 ENCOUNTER — Other Ambulatory Visit: Payer: Self-pay | Admitting: Family Medicine

## 2011-01-04 ENCOUNTER — Ambulatory Visit (INDEPENDENT_AMBULATORY_CARE_PROVIDER_SITE_OTHER): Payer: PRIVATE HEALTH INSURANCE | Admitting: Family Medicine

## 2011-01-04 ENCOUNTER — Encounter: Payer: Self-pay | Admitting: Family Medicine

## 2011-01-04 ENCOUNTER — Telehealth: Payer: Self-pay | Admitting: Family Medicine

## 2011-01-04 VITALS — BP 160/90 | HR 64 | Resp 16 | Ht 68.0 in | Wt 201.1 lb

## 2011-01-04 DIAGNOSIS — Z1211 Encounter for screening for malignant neoplasm of colon: Secondary | ICD-10-CM

## 2011-01-04 DIAGNOSIS — F3289 Other specified depressive episodes: Secondary | ICD-10-CM

## 2011-01-04 DIAGNOSIS — I10 Essential (primary) hypertension: Secondary | ICD-10-CM

## 2011-01-04 DIAGNOSIS — E785 Hyperlipidemia, unspecified: Secondary | ICD-10-CM

## 2011-01-04 DIAGNOSIS — Z0000 Encounter for general adult medical examination without abnormal findings: Secondary | ICD-10-CM

## 2011-01-04 DIAGNOSIS — R195 Other fecal abnormalities: Secondary | ICD-10-CM

## 2011-01-04 DIAGNOSIS — F329 Major depressive disorder, single episode, unspecified: Secondary | ICD-10-CM

## 2011-01-04 LAB — POC HEMOCCULT BLD/STL (OFFICE/1-CARD/DIAGNOSTIC): Fecal Occult Blood, POC: POSITIVE

## 2011-01-04 MED ORDER — ALPRAZOLAM 1 MG PO TABS: 1.0000 mg | ORAL_TABLET | Freq: Every evening | ORAL | Status: DC | PRN

## 2011-01-04 NOTE — Telephone Encounter (Signed)
Med sent as requested 

## 2011-01-04 NOTE — Patient Instructions (Signed)
F/u in 3 months.  It is vital that you take your prescribed meds every day, your bP is high.  You have hidden blood in your stool and will be referred  for a colonscopy  Fasting lipid, hepatic and chem 7 in 3 months, BEFORE next visit.  EKG today

## 2011-01-05 NOTE — Assessment & Plan Note (Signed)
Uncontrolled. Medication compliance addressed. Commitment to regular exercise, and healthy  eating habits with portion control discussed. DASH diet, and low fat diet discussed, and literature offered. No changes in medication at this time. Non compliance is the problem

## 2011-01-05 NOTE — Assessment & Plan Note (Signed)
Deteriorated and uncontrolled, low fat diet discussed and encouraged, pt needs to take med as prescribed also

## 2011-01-05 NOTE — Assessment & Plan Note (Signed)
New diagnosis, pt needs screening colonoscopy, this was discussed, and he understands

## 2011-01-05 NOTE — Assessment & Plan Note (Signed)
Improved on medication, not suicidal or homicidal, he will continue same meds

## 2011-01-05 NOTE — Progress Notes (Signed)
  Subjective:    Patient ID: John [name], male    DOB: 1960/10/15, 50 y.o.   MRN: [id number]  HPI The PT is here for annual exam and re-evaluation of chronic medical conditions, medication management and review of any recent  recent lab and radiology data.  He has been off of his BP meds for approx 1 week, states he had no money to buy them, his BP is elevated, and he is asymptomatic. The risks of doing this were discussed, and he is encouraged to be faithful in taking his medication. Preventive health is updated, specifically  Cancer screening, Osteoporosis screening and Immunization.   Questions or concerns regarding consultations or procedures which the PT has had in the interim are  addressed. The PT denies any adverse reactions to current medications since the last visit.  There are no new concerns.      Review of Systems Denies recent fever or chills. Denies sinus pressure, nasal congestion, ear pain or sore throat. Denies chest congestion, productive cough or wheezing. Denies chest pains, palpitations, paroxysmal nocturnal dyspnea, orthopnea and leg swelling Denies abdominal pain, nausea, vomiting,diarrhea or constipation.  Denies rectal bleeding or change in bowel movement. Denies dysuria, frequency, hesitancy or incontinence. Denies joint pain, swelling and limitation in mobility. Denies headaches, seizure, numbness, or tingling. Denies depression, anxiety or insomnia. Denies skin break down or rash.        Objective:   Physical Exam Pleasant well nourished male, alert and oriented x 3, in no cardio-pulmonary distress. Afebrile. HEENT No facial trauma or asymetry.   EOMI, PERTL, fundoscopic exam is negative for hemorhages or exudates. External ears normal, tympanic membranes clear. Oropharynx moist, no exudate,poor dentition. Neck: supple, no adenopathy,JVD or thyromegaly.No bruits.  Chest: Clear to ascultation bilaterally.No crackles or wheezes. Non tender to  palpation  Breast: No asymetry,no masses. No nipple discharge or inversion. No axillary or supraclavicular adenopathy  Cardiovascular system; Heart sounds normal,  S1 and  S2 ,no S3.  No murmur, or thrill. Apical beat not displaced Peripheral pulses normal.  Abdomen: Soft, non tender, no organomegaly or masses. No bruits. Bowel sounds normal. No guarding, tenderness or rebound.  Rectal:  No mass. guaic positive  stool. Prostate smooth and firm  GU: No penile lesion or discharge. No testicular mass.  Musculoskeletal exam: Full ROM of spine, hips , shoulders and knees. No deformity ,swelling or crepitus noted. No muscle wasting or atrophy.   Neurologic: Cranial nerves 2 to 12 intact. Power, tone ,sensation and reflexes normal throughout. No disturbance in gait. No tremor.  Skin: Intact, no ulceration, erythema , scaling or rash noted. Pigmentation normal throughout  Psych; Normal mood and affect. Judgement and concentration normal         Assessment & Plan:

## 2011-01-06 ENCOUNTER — Encounter: Payer: Self-pay | Admitting: Family Medicine

## 2011-01-13 ENCOUNTER — Ambulatory Visit (INDEPENDENT_AMBULATORY_CARE_PROVIDER_SITE_OTHER): Payer: PRIVATE HEALTH INSURANCE | Admitting: Gastroenterology

## 2011-01-13 ENCOUNTER — Encounter: Payer: Self-pay | Admitting: Gastroenterology

## 2011-01-13 DIAGNOSIS — Z1211 Encounter for screening for malignant neoplasm of colon: Secondary | ICD-10-CM

## 2011-01-13 NOTE — Progress Notes (Signed)
Cc to PCP 

## 2011-01-13 NOTE — Progress Notes (Signed)
Referring Provider: Syliva Overman, MD Primary Care Physician:  Syliva Overman, MD, MD Primary Gastroenterologist:  Dr. Darrick Penna   Chief Complaint  Patient presents with  . Rectal Bleeding    HPI:   John [name] is a 50 year old African-American male who presents today for a visit prior to initial screening colonoscopy. During a routine physical with Dr. Lodema Hong, it was noted he was heme +. He denies any melena or brbpr. He has had no change in bowel habits, although he does feel occasionally constipated. He averages once per day. Denies bloating or abdominal pain. No N/V. He believes he weighed around 200 when he saw Dr. Lodema Hong. Today on our scales, he is 192. He does exercise. Denies any weakness or fatigue. He has no FH of colon cancer.   Past Medical History  Diagnosis Date  . Depression 2007  . Hyperlipidemia   . Hypertension   . Hypogonadism male   . Anxiety     Past Surgical History  Procedure Date  . Hernia repair     ventral hernia repair     Current Outpatient Prescriptions  Medication Sig Dispense Refill  . ALPRAZolam (XANAX) 1 MG tablet Take 1 tablet (1 mg total) by mouth at bedtime as needed for sleep.  30 tablet  3  . FLUoxetine (PROZAC) 10 MG capsule take 1 capsule by mouth once daily  30 capsule  0  . simvastatin (ZOCOR) 20 MG tablet Take 20 mg by mouth at bedtime. Take one tablet by mouth at bedtime for Cholesterol       . triamterene-hydrochlorothiazide (MAXZIDE-25) 37.5-25 MG per tablet Take 1 tablet by mouth daily. Take one tablet by mouth once a day         Allergies as of 01/13/2011  . (No Known Allergies)    Family History  Problem Relation Age of Onset  . Heart failure Mother     living   . Hypertension Mother   . Colon cancer Neg Hx     History   Social History  . Marital Status: Married    Spouse Name: N/A    Number of Children: 1  . Years of Education: N/A   Occupational History  . Dispensing optician    Social History Main Topics    . Smoking status: Never Smoker   . Smokeless tobacco: Not on file  . Alcohol Use: Yes     occasional beer socially.  . Drug Use: No  . Sexually Active: Not on file    Review of Systems: Gen: Denies any fever, chills, loss of appetite, fatigue, self-reports 8 lbs lost since visit with PCP. Exercising.  CV: Denies chest pain, heart palpitations, syncope, peripheral edema. Resp: Denies shortness of breath with rest, cough, wheezing GI: Denies dysphagia or odynophagia. Denies hematemesis, fecal incontinence, or jaundice.  GU : Denies urinary burning, urinary frequency, urinary incontinence.  MS: Denies joint pain, muscle weakness, cramps, limited movement Derm: Denies rash, itching, dry skin Psych: Denies depression, anxiety, confusion or memory loss  Heme: Denies bruising, bleeding, and enlarged lymph nodes.  Physical Exam: BP 157/87  Pulse 69  Temp(Src) 97.9 F (36.6 C) (Temporal)  Ht 5\' 8"  (1.727 m)  Wt 192 lb 12.8 oz (87.454 kg)  BMI 29.32 kg/m2 General:   Alert and oriented. Well-developed, well-nourished, pleasant and cooperative. Head:  Normocephalic and atraumatic. Eyes:  Conjunctiva pink, sclera clear, no icterus.   Conjunctiva pink. Ears:  Normal auditory acuity. Nose:  No deformity, discharge,  or lesions. Mouth:  No deformity or lesions, mucosa pink and moist.  Neck:  Supple, without mass or thyromegaly. Lungs:  Clear to auscultation bilaterally, without wheezing, rales, or rhonchi.  Heart:  S1, S2 present without murmurs noted.  Abdomen:  +BS, soft, non-tender and non-distended. Without mass or HSM. No rebound or guarding. No hernias noted. Rectal:  Deferred  Msk:  Symmetrical without gross deformities. Normal posture. Extremities:  Without clubbing or edema. Neurologic:  Alert and  oriented x4;  grossly normal neurologically. Skin:  Intact, warm and dry without significant lesions or rashes Cervical Nodes:  No significant cervical adenopathy. Psych:  Alert and  cooperative. Normal mood and affect.

## 2011-01-13 NOTE — Assessment & Plan Note (Addendum)
50 year old African-American male presenting for initial screening colonoscopy, recently found to be heme + on routine physical exam. No abdominal pain, significant change in bowel habits. No brbpr or melena. Pt tends to deal with intermittent constipation, otherwise without complaints. Will proceed with colonoscopy and further recommendations to follow. As of note, he is devoid of any UGI symptoms at all.   Proceed with colonoscopy with Dr. Darrick Penna in the near future. The risks, benefits, and alternatives have been discussed in detail with the patient. They state understanding and desire to proceed.

## 2011-01-13 NOTE — Patient Instructions (Addendum)
We have set you up for a colonoscopy with Dr. Darrick Penna.  No changes in medications.   Further recommendations to follow.

## 2011-01-19 MED ORDER — SODIUM CHLORIDE 0.45 % IV SOLN
Freq: Once | INTRAVENOUS | Status: AC
  Administered 2011-01-20: 1000 mL via INTRAVENOUS

## 2011-01-20 ENCOUNTER — Encounter: Payer: PRIVATE HEALTH INSURANCE | Admitting: Gastroenterology

## 2011-01-20 ENCOUNTER — Ambulatory Visit (HOSPITAL_COMMUNITY)
Admission: RE | Admit: 2011-01-20 | Discharge: 2011-01-20 | Disposition: A | Discharge status: Home / Self Care | Payer: PRIVATE HEALTH INSURANCE | Source: Ambulatory Visit | Attending: Gastroenterology | Admitting: Gastroenterology

## 2011-01-20 ENCOUNTER — Encounter (HOSPITAL_COMMUNITY): Payer: Self-pay | Admitting: *Deleted

## 2011-01-20 ENCOUNTER — Encounter (HOSPITAL_COMMUNITY)
Admission: RE | Disposition: A | Discharge status: Home / Self Care | Payer: Self-pay | Source: Ambulatory Visit | Attending: Gastroenterology

## 2011-01-20 DIAGNOSIS — Z1211 Encounter for screening for malignant neoplasm of colon: Secondary | ICD-10-CM | POA: Insufficient documentation

## 2011-01-20 DIAGNOSIS — K648 Other hemorrhoids: Secondary | ICD-10-CM | POA: Insufficient documentation

## 2011-01-20 DIAGNOSIS — E785 Hyperlipidemia, unspecified: Secondary | ICD-10-CM | POA: Insufficient documentation

## 2011-01-20 DIAGNOSIS — Z79899 Other long term (current) drug therapy: Secondary | ICD-10-CM | POA: Insufficient documentation

## 2011-01-20 DIAGNOSIS — I10 Essential (primary) hypertension: Secondary | ICD-10-CM | POA: Insufficient documentation

## 2011-01-20 HISTORY — PX: COLONOSCOPY: SHX5424

## 2011-01-20 SURGERY — COLONOSCOPY
Anesthesia: Moderate Sedation

## 2011-01-20 MED ORDER — MEPERIDINE HCL 100 MG/ML IJ SOLN
INTRAMUSCULAR | Status: DC | PRN
  Administered 2011-01-20: 50 mg via INTRAVENOUS
  Administered 2011-01-20: 25 mg via INTRAVENOUS
  Administered 2011-01-20: 50 mg via INTRAVENOUS

## 2011-01-20 MED ORDER — MEPERIDINE HCL 100 MG/ML IJ SOLN
INTRAMUSCULAR | Status: AC
  Filled 2011-01-20: qty 2

## 2011-01-20 MED ORDER — PROMETHAZINE HCL 25 MG/ML IJ SOLN
INTRAMUSCULAR | Status: DC | PRN
  Administered 2011-01-20: 12.5 mg via INTRAVENOUS

## 2011-01-20 MED ORDER — PROMETHAZINE HCL 25 MG/ML IJ SOLN
INTRAMUSCULAR | Status: AC
  Filled 2011-01-20: qty 1

## 2011-01-20 MED ORDER — STERILE WATER FOR IRRIGATION IR SOLN
Status: DC | PRN
  Administered 2011-01-20: 14:00:00

## 2011-01-20 MED ORDER — MIDAZOLAM HCL 5 MG/5ML IJ SOLN
INTRAMUSCULAR | Status: AC
  Filled 2011-01-20: qty 10

## 2011-01-20 MED ORDER — MIDAZOLAM HCL 5 MG/5ML IJ SOLN
INTRAMUSCULAR | Status: DC | PRN
  Administered 2011-01-20 (×3): 2 mg via INTRAVENOUS

## 2011-01-20 MED ORDER — HYDROCORTISONE ACETATE 25 MG RE SUPP: 25.0000 mg | Freq: Two times a day (BID) | RECTAL | Status: AC

## 2011-01-20 NOTE — Interval H&P Note (Signed)
History and Physical Interval Note:  heme pos stools, no overt gib  01/20/2011   1:54 PM   [name]  has presented today for surgery, with the diagnosis of screening   The various methods of treatment have been discussed with the patient and family. After consideration of risks, benefits and other options for treatment, the patient has consented to  Procedure(s): COLONOSCOPY as a surgical intervention .  I have reviewed the patients' chart and labs.  Questions were answered to the patient's satisfaction.     Jonette Eva  MD

## 2011-01-20 NOTE — H&P (Signed)
Reason for Visit     Rectal Bleeding        Current Vitals       Recorded User        01/13/2011  8:24 AM  John [name], NT           BP Pulse Temp (Src) Resp Ht Wt    157/87  69  97.9 F (36.6 C) (Temporal)  N/A  5\' 8"  (1.727 m)  192 lb 12.8 oz (87.454 kg)       BMI SpO2 PF    29.32 kg/m2  N/A  N/A          Progress Notes     John Halls, NP  01/13/2011  1:52 PM  Signed   Referring Provider: Syliva Overman, MD Primary Care Physician:  John Overman, MD, MD Primary Gastroenterologist:  Dr. Darrick [name]     Chief Complaint   Patient presents with   .  Rectal Bleeding      HPI:    John [name] is a 50 year old African-American male who presents today for a visit prior to initial screening colonoscopy. During a routine physical with Dr. Lodema [name], it was noted he was heme +. He denies any melena or brbpr. He has had no change in bowel habits, although he does feel occasionally constipated. He averages once per day. Denies bloating or abdominal pain. No N/V. He believes he weighed around 200 when he saw Dr. Lodema [name]. Today on our scales, he is 192. He does exercise. Denies any weakness or fatigue. He has no FH of colon cancer.     Past Medical History   Diagnosis  Date   .  Depression  2007   .  Hyperlipidemia     .  Hypertension     .  Hypogonadism male     .  Anxiety         Past Surgical History   Procedure  Date   .  Hernia repair         ventral hernia repair        Current Outpatient Prescriptions   Medication  Sig  Dispense  Refill   .  ALPRAZolam (XANAX) 1 MG tablet  Take 1 tablet (1 mg total) by mouth at bedtime as needed for sleep.   30 tablet   3   .  FLUoxetine (PROZAC) 10 MG capsule  take 1 capsule by mouth once daily   30 capsule   0   .  simvastatin (ZOCOR) 20 MG tablet  Take 20 mg by mouth at bedtime. Take one tablet by mouth at bedtime for Cholesterol          .  triamterene-hydrochlorothiazide (MAXZIDE-25) 37.5-25 MG per tablet  Take 1  tablet by mouth daily. Take one tablet by mouth once a day              Allergies as of 01/13/2011   .  (No Known Allergies)       Family History   Problem  Relation  Age of Onset   .  Heart failure  Mother         living    .  Hypertension  Mother     .  Colon cancer  Neg Hx         History       Social History   .  Marital Status:  Married       Spouse Name:  N/A  Number of Children:  1   .  Years of Education:  N/A       Occupational History   .  Dispensing optician         Social History Main Topics   .  Smoking status:  Never Smoker    .  Smokeless tobacco:  Not on file   .  Alcohol Use:  Yes         occasional beer socially.   .  Drug Use:  No   .  Sexually Active:  Not on file        Review of Systems: Gen: Denies any fever, chills, loss of appetite, fatigue, self-reports 8 lbs lost since visit with PCP. Exercising.  CV: Denies chest pain, heart palpitations, syncope, peripheral edema. Resp: Denies shortness of breath with rest, cough, wheezing GI: Denies dysphagia or odynophagia. Denies hematemesis, fecal incontinence, or jaundice.   GU : Denies urinary burning, urinary frequency, urinary incontinence.   MS: Denies joint pain, muscle weakness, cramps, limited movement Derm: Denies rash, itching, dry skin Psych: Denies depression, anxiety, confusion or memory loss   Heme: Denies bruising, bleeding, and enlarged lymph nodes.   Physical Exam: BP 157/87  Pulse 69  Temp(Src) 97.9 F (36.6 C) (Temporal)  Ht 5\' 8"  (1.727 m)  Wt 192 lb 12.8 oz (87.454 kg)  BMI 29.32 kg/m2 General:   Alert and oriented. Well-developed, well-nourished, pleasant and cooperative. Head:  Normocephalic and atraumatic. Eyes:  Conjunctiva pink, sclera clear, no icterus.   Conjunctiva pink. Ears:  Normal auditory acuity. Nose:  No deformity, discharge,  or lesions. Mouth:  No deformity or lesions, mucosa pink and moist.   Neck:  Supple, without mass or thyromegaly. Lungs:   Clear to auscultation bilaterally, without wheezing, rales, or rhonchi.   Heart:  S1, S2 present without murmurs noted.   Abdomen:  +BS, soft, non-tender and non-distended. Without mass or HSM. No rebound or guarding. No hernias noted. Rectal:  Deferred   Msk:  Symmetrical without gross deformities. Normal posture. Extremities:  Without clubbing or edema. Neurologic:  Alert and  oriented x4;  grossly normal neurologically. Skin:  Intact, warm and dry without significant lesions or rashes Cervical Nodes:  No significant cervical adenopathy. Psych:  Alert and cooperative. Normal mood and affect.       Glendora Score  01/13/2011  2:17 PM  Signed Cc to PCP        Encounter for screening colonoscopy - John Halls, NP  01/13/2011  1:52 PM  Addendum 49 year old African-American male presenting for initial screening colonoscopy, recently found to be heme + on routine physical exam. No abdominal pain, significant change in bowel habits. No brbpr or melena. Pt tends to deal with intermittent constipation, otherwise without complaints. Will proceed with colonoscopy and further recommendations to follow. As of note, he is devoid of any UGI symptoms at all.    Proceed with colonoscopy with Dr. Darrick [name] in the near future. The risks, benefits, and alternatives have been discussed in detail with the patient. They state understanding and desire to proceed.

## 2011-01-24 NOTE — Progress Notes (Signed)
MAY NEED PHENERGAN. AGREE

## 2011-01-26 ENCOUNTER — Encounter (HOSPITAL_COMMUNITY): Payer: Self-pay | Admitting: Gastroenterology

## 2011-02-02 ENCOUNTER — Other Ambulatory Visit: Payer: Self-pay | Admitting: Family Medicine

## 2011-04-12 ENCOUNTER — Encounter: Payer: Self-pay | Admitting: Family Medicine

## 2011-04-13 ENCOUNTER — Encounter: Payer: Self-pay | Admitting: Family Medicine

## 2011-04-13 ENCOUNTER — Ambulatory Visit (INDEPENDENT_AMBULATORY_CARE_PROVIDER_SITE_OTHER): Payer: PRIVATE HEALTH INSURANCE | Admitting: Family Medicine

## 2011-04-13 DIAGNOSIS — I10 Essential (primary) hypertension: Secondary | ICD-10-CM

## 2011-04-13 DIAGNOSIS — Z23 Encounter for immunization: Secondary | ICD-10-CM

## 2011-04-13 DIAGNOSIS — R5383 Other fatigue: Secondary | ICD-10-CM

## 2011-04-13 DIAGNOSIS — F329 Major depressive disorder, single episode, unspecified: Secondary | ICD-10-CM

## 2011-04-13 DIAGNOSIS — R7301 Impaired fasting glucose: Secondary | ICD-10-CM

## 2011-04-13 DIAGNOSIS — E785 Hyperlipidemia, unspecified: Secondary | ICD-10-CM

## 2011-04-13 MED ORDER — ALPRAZOLAM 1 MG PO TABS: 1.0000 mg | ORAL_TABLET | Freq: Every evening | ORAL | Status: DC | PRN

## 2011-04-13 MED ORDER — INFLUENZA VAC TYPES A & B PF IM SUSP: 0.5000 mL | Freq: Once | INTRAMUSCULAR | Status: DC

## 2011-04-13 NOTE — Patient Instructions (Signed)
F/U early to mid January  Flu vaccine today.  You need to reduce calories and exercise more ,because you should try to lose even 6 pounds before you get back  PLEASE remember to take all meds every day.  No TV or radio during sleep  It is important that you exercise regularly at least 30 minutes 5 times a week. If you develop chest pain, have severe difficulty breathing, or feel very tired, stop exercising immediately and seek medical attention  A healthy diet is rich in fruit, vegetables and whole grains. Poultry fish, nuts and beans are a healthy choice for protein rather then red meat. A low sodium diet and drinking 64 ounces of water daily is generally recommended. Oils and sweet should be limited. Carbohydrates especially for those who are diabetic or overweight, should be limited to 34-45 gram per meal. It is important to eat on a regular schedule, at least 3 times daily. Snacks should be primarily fruits, vegetables or nuts.  Fasting lipid, hepatic, chem 7, TSH in January

## 2011-04-13 NOTE — Progress Notes (Signed)
  Subjective:    Patient ID: John [name], male    DOB: Jun 03, 1961, 50 y.o.   MRN: [id number]  HPI The PT is here for follow up and re-evaluation of chronic medical conditions, medication management and review of any available recent lab and radiology data.  Preventive health is updated, specifically  Cancer screening and Immunization.   Questions or concerns regarding consultations or procedures which the PT has had in the interim are  addressed. The PT denies any adverse reactions to current medications since the last visit. He often forgets to take  His medication unfortunately.repors out of BP med , had no money Three weeks ago he became very depressed,had a son who died around this time and this is a recurrent problem at this time of the year.He  also c/o insomnia, difficulty both falling and staying asleep      Review of Systems See HPI Denies recent fever or chills. Denies sinus pressure, nasal congestion, ear pain or sore throat. Denies chest congestion, productive cough or wheezing. Denies chest pains, palpitations and leg swelling Denies abdominal pain, nausea, vomiting,diarrhea or constipation.   Denies dysuria, frequency, hesitancy or incontinence. Denies joint pain, swelling and limitation in mobility. Denies headaches, seizures, numbness, or tingling.  Denies skin break down or rash.        Objective:   Physical Exam  Patient alert and oriented and in no cardiopulmonary distress.  HEENT: No facial asymmetry, EOMI, no sinus tenderness,  oropharynx pink and moist.  Neck supple no adenopathy.  Chest: Clear to auscultation bilaterally.  CVS: S1, S2 no murmurs, no S3.  ABD: Soft non tender. Bowel sounds normal.  Ext: No edema  MS: Adequate ROM spine, shoulders, hips and knees.  Skin: Intact, no ulcerations or rash noted.  Psych: Good eye contact, normal affect. Memory intact not anxious or depressed appearing.  CNS: CN 2-12 intact, power, tone and  sensation normal throughout.       Assessment & Plan:

## 2011-04-25 NOTE — Assessment & Plan Note (Signed)
Uncontrolled due to non compliance,i mportance of same stressed 

## 2011-04-25 NOTE — Assessment & Plan Note (Signed)
Low carb diet discussed and encouraged

## 2011-04-25 NOTE — Assessment & Plan Note (Signed)
Hyperlipidemia:Low fat diet discussed and encouraged.  Due to elevated liver enzymes pt needs to rely primarily on diet at this time, needs rept labs asap

## 2011-04-25 NOTE — Assessment & Plan Note (Signed)
Improved on medication, pt to continue same 

## 2011-06-07 ENCOUNTER — Other Ambulatory Visit: Payer: Self-pay | Admitting: Family Medicine

## 2011-06-30 ENCOUNTER — Other Ambulatory Visit: Payer: Self-pay

## 2011-06-30 MED ORDER — ALPRAZOLAM 1 MG PO TABS: 1.0000 mg | ORAL_TABLET | Freq: Every evening | ORAL | Status: DC | PRN

## 2011-07-06 ENCOUNTER — Other Ambulatory Visit: Payer: Self-pay | Admitting: Family Medicine

## 2011-07-08 ENCOUNTER — Encounter: Payer: Self-pay | Admitting: Family Medicine

## 2011-07-12 ENCOUNTER — Encounter: Payer: Self-pay | Admitting: Family Medicine

## 2011-07-12 ENCOUNTER — Ambulatory Visit (INDEPENDENT_AMBULATORY_CARE_PROVIDER_SITE_OTHER): Payer: PRIVATE HEALTH INSURANCE | Admitting: Family Medicine

## 2011-07-12 VITALS — BP 140/82 | HR 96 | Resp 18 | Ht 68.0 in | Wt 196.1 lb

## 2011-07-12 DIAGNOSIS — I10 Essential (primary) hypertension: Secondary | ICD-10-CM

## 2011-07-12 DIAGNOSIS — F3289 Other specified depressive episodes: Secondary | ICD-10-CM

## 2011-07-12 DIAGNOSIS — E785 Hyperlipidemia, unspecified: Secondary | ICD-10-CM

## 2011-07-12 DIAGNOSIS — F329 Major depressive disorder, single episode, unspecified: Secondary | ICD-10-CM

## 2011-07-12 DIAGNOSIS — R7301 Impaired fasting glucose: Secondary | ICD-10-CM

## 2011-07-12 MED ORDER — PRAVASTATIN SODIUM 80 MG PO TABS: 80.0000 mg | ORAL_TABLET | Freq: Every day | ORAL | Status: DC

## 2011-07-12 MED ORDER — FLUOXETINE HCL 10 MG PO CAPS: 10.0000 mg | ORAL_CAPSULE | Freq: Every day | ORAL | Status: DC

## 2011-07-12 NOTE — Patient Instructions (Addendum)
CPE in 5.5 months.   Congrats on weight loss and regular exercise, pls keep it up.  It is important that you exercise regularly at least 30 minutes 5 times a week. If you develop chest pain, have severe difficulty breathing, or feel very tired, stop exercising immediately and seek medical attention  A healthy diet is rich in fruit, vegetables and whole grains. Poultry fish, nuts and beans are a healthy choice for protein rather then red meat. A low sodium diet and drinking 64 ounces of water daily is generally recommended. Oils and sweet should be limited. Carbohydrates especially for those who are diabetic or overweight, should be limited to 34-45 gram per meal. It is important to eat on a regular schedule, at least 3 times daily. Snacks should be primarily fruits, vegetables or nuts.  Weight loss goal of approx 12 pounds in the next 5.5 months.  Blood pressure is improved though still not at goal,BPis 140/80.  Increase fruit and veg, daily exercise and weight loss will correct this.  Continue all current medication

## 2011-07-13 LAB — BASIC METABOLIC PANEL
BUN: 15 mg/dL (ref 6–23)
CO2: 31 mEq/L (ref 19–32)
Glucose, Bld: 97 mg/dL (ref 70–99)
Potassium: 3.8 mEq/L (ref 3.5–5.3)
Sodium: 137 mEq/L (ref 135–145)

## 2011-07-13 LAB — HEPATIC FUNCTION PANEL
ALT: 41 U/L (ref 0–53)
AST: 34 U/L (ref 0–37)
Bilirubin, Direct: 0.2 mg/dL (ref 0.0–0.3)
Indirect Bilirubin: 0.6 mg/dL (ref 0.0–0.9)
Total Bilirubin: 0.8 mg/dL (ref 0.3–1.2)

## 2011-07-13 LAB — LIPID PANEL
Cholesterol: 201 mg/dL — ABNORMAL HIGH (ref 0–200)
Total CHOL/HDL Ratio: 4.3 Ratio
VLDL: 17 mg/dL (ref 0–40)

## 2011-07-13 LAB — TSH: TSH: 2.268 u[IU]/mL (ref 0.350–4.500)

## 2011-07-17 NOTE — Assessment & Plan Note (Signed)
Improved though still not at goal, no med change, lifestyle change only

## 2011-07-17 NOTE — Progress Notes (Signed)
  Subjective:    Patient ID: John [name], male    DOB: 1961/02/24, 51 y.o.   MRN: [id number]  HPI The PT is here for follow up and re-evaluation of chronic medical conditions, medication management and review of any available recent lab and radiology data.  Preventive health is updated, specifically  Cancer screening and Immunization.   Questions or concerns regarding consultations or procedures which the PT has had in the interim are  addressed. The PT denies any adverse reactions to current medications since the last visit.  There are no new concerns.  There are no specific complaints       Review of Systems See HPI Denies recent fever or chills. Denies sinus pressure, nasal congestion, ear pain or sore throat. Denies chest congestion, productive cough or wheezing. Denies chest pains, palpitations and leg swelling Denies abdominal pain, nausea, vomiting,diarrhea or constipation.   Denies dysuria, frequency, hesitancy or incontinence. Denies joint pain, swelling and limitation in mobility. Denies headaches, seizures, numbness, or tingling. Denies depression, anxiety or insomnia. Denies skin break down or rash.        Objective:   Physical Exam Patient alert and oriented and in no cardiopulmonary distress.  HEENT: No facial asymmetry, EOMI, no sinus tenderness,  oropharynx pink and moist.  Neck supple no adenopathy.  Chest: Clear to auscultation bilaterally.  CVS: S1, S2 no murmurs, no S3.  ABD: Soft non tender. Bowel sounds normal.  Ext: No edema  MS: Adequate ROM spine, shoulders, hips and knees.  Skin: Intact, no ulcerations or rash noted.  Psych: Good eye contact, normal affect. Memory intact not anxious or depressed appearing.  CNS: CN 2-12 intact, power, tone and sensation normal throughout.        Assessment & Plan:

## 2011-07-17 NOTE — Assessment & Plan Note (Signed)
Controlled on medication continue same 

## 2011-07-17 NOTE — Assessment & Plan Note (Signed)
Improved, also liver enz normal continue meds

## 2011-07-17 NOTE — Assessment & Plan Note (Signed)
Importance lo lifestyle change to stave off diabetes is discussed, pt is engaged with this and doing well

## 2011-07-21 ENCOUNTER — Other Ambulatory Visit: Payer: Self-pay | Admitting: Family Medicine

## 2011-08-22 ENCOUNTER — Other Ambulatory Visit: Payer: Self-pay | Admitting: Family Medicine

## 2011-08-25 ENCOUNTER — Other Ambulatory Visit: Payer: Self-pay | Admitting: Family Medicine

## 2011-12-22 ENCOUNTER — Other Ambulatory Visit: Payer: Self-pay | Admitting: Family Medicine

## 2012-01-09 ENCOUNTER — Encounter: Payer: Self-pay | Admitting: Family Medicine

## 2012-01-09 ENCOUNTER — Ambulatory Visit (INDEPENDENT_AMBULATORY_CARE_PROVIDER_SITE_OTHER): Payer: PRIVATE HEALTH INSURANCE | Admitting: Family Medicine

## 2012-01-09 VITALS — BP 140/82 | HR 64 | Resp 16 | Ht 68.0 in | Wt 197.0 lb

## 2012-01-09 DIAGNOSIS — I10 Essential (primary) hypertension: Secondary | ICD-10-CM

## 2012-01-09 DIAGNOSIS — F329 Major depressive disorder, single episode, unspecified: Secondary | ICD-10-CM

## 2012-01-09 DIAGNOSIS — Z1211 Encounter for screening for malignant neoplasm of colon: Secondary | ICD-10-CM

## 2012-01-09 DIAGNOSIS — Z0000 Encounter for general adult medical examination without abnormal findings: Secondary | ICD-10-CM

## 2012-01-09 DIAGNOSIS — R7301 Impaired fasting glucose: Secondary | ICD-10-CM

## 2012-01-09 DIAGNOSIS — E785 Hyperlipidemia, unspecified: Secondary | ICD-10-CM

## 2012-01-09 DIAGNOSIS — Z125 Encounter for screening for malignant neoplasm of prostate: Secondary | ICD-10-CM

## 2012-01-09 LAB — COMPREHENSIVE METABOLIC PANEL
ALT: 21 U/L (ref 0–53)
AST: 22 U/L (ref 0–37)
Albumin: 4.6 g/dL (ref 3.5–5.2)
Alkaline Phosphatase: 61 U/L (ref 39–117)
Potassium: 4.6 mEq/L (ref 3.5–5.3)
Sodium: 140 mEq/L (ref 135–145)
Total Bilirubin: 0.8 mg/dL (ref 0.3–1.2)
Total Protein: 7.7 g/dL (ref 6.0–8.3)

## 2012-01-09 LAB — CBC
HCT: 41.4 % (ref 39.0–52.0)
Hemoglobin: 14.3 g/dL (ref 13.0–17.0)
MCH: 31.9 pg (ref 26.0–34.0)
MCHC: 34.5 g/dL (ref 30.0–36.0)
RDW: 13 % (ref 11.5–15.5)

## 2012-01-09 LAB — LIPID PANEL
LDL Cholesterol: 169 mg/dL — ABNORMAL HIGH (ref 0–99)
VLDL: 29 mg/dL (ref 0–40)

## 2012-01-09 LAB — HEMOGLOBIN A1C
Hgb A1c MFr Bld: 5.6 % (ref <5.7)
Mean Plasma Glucose: 114 mg/dL (ref <117)

## 2012-01-09 MED ORDER — TRIAMTERENE-HCTZ 37.5-25 MG PO TABS: 1.0000 | ORAL_TABLET | Freq: Every day | ORAL | Status: DC

## 2012-01-09 MED ORDER — ALPRAZOLAM 1 MG PO TABS: 1.0000 mg | ORAL_TABLET | Freq: Every evening | ORAL | Status: DC | PRN

## 2012-01-09 NOTE — Assessment & Plan Note (Signed)
systolic elevated, increase fruit and veg, lower sodium, regular exercise and weight loss stressed, no med change

## 2012-01-09 NOTE — Assessment & Plan Note (Signed)
Hyperlipidemia:Low fat diet discussed and encouraged.  Updated lab today 

## 2012-01-09 NOTE — Assessment & Plan Note (Signed)
Controlled, no change in medication  

## 2012-01-09 NOTE — Progress Notes (Signed)
  Subjective:    Patient ID: John [name], male    DOB: 07-31-1960, 51 y.o.   MRN: [id number]  HPI The PT is here for annual exam, and re-evaluation of chronic medical conditions, medication management and review of any available recent lab and radiology data.  Preventive health is updated, specifically  Cancer screening and Immunization.   Questions or concerns regarding consultations or procedures which the PT has had in the interim are  addressed. The PT denies any adverse reactions to current medications since the last visit.  There are no new concerns.  There are no specific complaints       Review of Systems See HPI Denies recent fever or chills. Denies sinus pressure, nasal congestion, ear pain or sore throat. Denies chest congestion, productive cough or wheezing. Denies chest pains, palpitations and leg swelling Denies abdominal pain, nausea, vomiting,diarrhea or constipation.   Denies dysuria, frequency, hesitancy or incontinence. Denies joint pain, swelling and limitation in mobility. Denies headaches, seizures, numbness, or tingling. Denies depression, anxiety or insomnia. Denies skin break down or rash.        Objective:   Physical Exam Pleasant well nourished male, alert and oriented x 3, in no cardio-pulmonary distress. Afebrile. HEENT No facial trauma or asymetry. Sinuses non tender. EOMI, PERTL, fundoscopic exam is negative for hemorhages or exudates. External ears normal, tympanic membranes clear. Oropharynx moist, no exudate, good dentition. Neck: supple, no adenopathy,JVD or thyromegaly.No bruits.  Chest: Clear to ascultation bilaterally.No crackles or wheezes. Non tender to palpation  Breast: No asymetry,no masses. No nipple discharge or inversion. No axillary or supraclavicular adenopathy  Cardiovascular system; Heart sounds normal,  S1 and  S2 ,no S3.  No murmur, or thrill. Apical beat not displaced Peripheral pulses  normal.  Abdomen: Soft, non tender, no organomegaly or masses. No bruits. Bowel sounds normal. No guarding, tenderness or rebound.  Rectal:  No mass. guaiac negative stool. Prostate smooth and firm, not enlarged, no nodules   Musculoskeletal exam: Full ROM of spine, hips , shoulders and knees. No deformity ,swelling or crepitus noted. No muscle wasting or atrophy.   Neurologic: Cranial nerves 2 to 12 intact. Power, tone ,sensation and reflexes normal throughout. No disturbance in gait. No tremor.  Skin: Intact, no ulceration, erythema , scaling or rash noted. Pigmentation normal throughout  Psych; Normal mood and affect. Judgement and concentration normal          Assessment & Plan:

## 2012-01-09 NOTE — Assessment & Plan Note (Signed)
Discussed with pt the need for weight loss, regular physical activity, dietary change   Weight loss goal to improve blood pressure established  at 6 pounds

## 2012-01-09 NOTE — Patient Instructions (Addendum)
F/u in 6 month  Please stop smoking cigarettes, this will improve your health , and reduce your risk of stroke and heart disease and cancer.  CBc, fasting lipid, cmp, HBA1C and TSh and PSA today also vit D  Fasting lipid and cmp in  5 months and 2 weeks.  Blood pressure slightly high, cut back on [pasta, bread and potato, increase vegetables. You need to work on weight loss.  It is important that you exercise regularly at least 30 minutes 7 times a week. If you develop chest pain, have severe difficulty breathing, or feel very tired, stop exercising immediately and seek medical attention   Please start aspirin 81mg  daily for protection against heart disease

## 2012-01-10 LAB — VITAMIN D 25 HYDROXY (VIT D DEFICIENCY, FRACTURES): Vit D, 25-Hydroxy: 35 ng/mL (ref 30–89)

## 2012-01-10 MED ORDER — ROSUVASTATIN CALCIUM 20 MG PO TABS
20.0000 mg | ORAL_TABLET | Freq: Every day | ORAL | Status: DC
Start: 2012-01-10 — End: 2013-01-12

## 2012-01-10 NOTE — Addendum Note (Signed)
Addended by: Abner Greenspan on: 01/10/2012 02:03 PM   Modules accepted: Orders, Medications

## 2012-04-12 ENCOUNTER — Emergency Department (HOSPITAL_COMMUNITY)
Admission: EM | Admit: 2012-04-12 | Discharge: 2012-04-12 | Disposition: A | Discharge status: Home / Self Care | Payer: PRIVATE HEALTH INSURANCE | Attending: Emergency Medicine | Admitting: Emergency Medicine

## 2012-04-12 ENCOUNTER — Encounter (HOSPITAL_COMMUNITY): Payer: Self-pay | Admitting: Emergency Medicine

## 2012-04-12 ENCOUNTER — Emergency Department (HOSPITAL_COMMUNITY): Payer: PRIVATE HEALTH INSURANCE

## 2012-04-12 DIAGNOSIS — F329 Major depressive disorder, single episode, unspecified: Secondary | ICD-10-CM | POA: Insufficient documentation

## 2012-04-12 DIAGNOSIS — F411 Generalized anxiety disorder: Secondary | ICD-10-CM | POA: Insufficient documentation

## 2012-04-12 DIAGNOSIS — M65839 Other synovitis and tenosynovitis, unspecified forearm: Secondary | ICD-10-CM | POA: Insufficient documentation

## 2012-04-12 DIAGNOSIS — M779 Enthesopathy, unspecified: Secondary | ICD-10-CM

## 2012-04-12 DIAGNOSIS — I10 Essential (primary) hypertension: Secondary | ICD-10-CM | POA: Insufficient documentation

## 2012-04-12 DIAGNOSIS — Z8659 Personal history of other mental and behavioral disorders: Secondary | ICD-10-CM | POA: Insufficient documentation

## 2012-04-12 DIAGNOSIS — M65849 Other synovitis and tenosynovitis, unspecified hand: Secondary | ICD-10-CM | POA: Diagnosis not present

## 2012-04-12 DIAGNOSIS — F17200 Nicotine dependence, unspecified, uncomplicated: Secondary | ICD-10-CM | POA: Diagnosis not present

## 2012-04-12 DIAGNOSIS — Z79899 Other long term (current) drug therapy: Secondary | ICD-10-CM | POA: Insufficient documentation

## 2012-04-12 DIAGNOSIS — E785 Hyperlipidemia, unspecified: Secondary | ICD-10-CM | POA: Diagnosis not present

## 2012-04-12 DIAGNOSIS — E291 Testicular hypofunction: Secondary | ICD-10-CM | POA: Diagnosis not present

## 2012-04-12 DIAGNOSIS — F3289 Other specified depressive episodes: Secondary | ICD-10-CM | POA: Insufficient documentation

## 2012-04-12 DIAGNOSIS — M79609 Pain in unspecified limb: Secondary | ICD-10-CM | POA: Diagnosis present

## 2012-04-12 MED ORDER — NAPROXEN 500 MG PO TABS: 500.0000 mg | ORAL_TABLET | Freq: Two times a day (BID) | ORAL | Status: DC

## 2012-04-12 NOTE — ED Provider Notes (Signed)
History     CSN: [id number]  Arrival date & time 04/12/12  4098   First MD Initiated Contact with Patient 04/12/12 [phone]      Chief Complaint  Patient presents with  . Hand Pain    (Consider location/radiation/quality/duration/timing/severity/associated sxs/prior treatment) HPI Comments: John [name] since with acute on chronic right hand and thumb pain which has worsened since yesterday evening.  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  John [name] denies any injury and has tried no treatment prior to arrival.  John [name] denies weakness in his thumb or fingers and has had no numbness or other deficit.  The history is provided by the patient.    Past Medical History  Diagnosis Date  . Depression 2007  . Hyperlipidemia   . Hypertension   . Hypogonadism male   . Anxiety     Past Surgical History  Procedure Date  . Hernia repair     ventral hernia repair   . Colonoscopy 01/20/2011    Procedure: COLONOSCOPY;  Surgeon: Arlyce Harman, MD;  Location: [facility];  Service: Endoscopy;  Laterality: N/A;    Family History  Problem Relation Age of Onset  . Heart failure Mother     living   . Hypertension Mother   . Colon cancer Neg Hx     History  Substance Use Topics  . Smoking status: Current Some Day Smoker -- 0.2 packs/day    Types: Cigarettes  . Smokeless tobacco: Not on file  . Alcohol Use: 0.6 oz/week    1 Cans of beer per week     occasional beer socially.      Review of Systems  Constitutional: Negative for fever.  Musculoskeletal: Positive for arthralgias. Negative for joint swelling.  Skin: Negative for wound.  Neurological: Negative for weakness and numbness.    Allergies  Review of patient's allergies indicates no known allergies.  Home Medications   Current Outpatient  Rx  Name Route Sig Dispense Refill  . ALPRAZOLAM 1 MG PO TABS Oral Take 1 tablet (1 mg total) by mouth at bedtime as needed for sleep. 30 tablet 4  . FLUOXETINE HCL 10 MG PO CAPS Oral Take 1 capsule (10 mg total) by mouth daily. 30 capsule 5  . ROSUVASTATIN CALCIUM 20 MG PO TABS Oral Take 1 tablet (20 mg total) by mouth at bedtime. D/c pravastatin 30 tablet 11  . TRIAMTERENE-HCTZ 37.5-25 MG PO TABS Oral Take 1 each (1 tablet total) by mouth daily. 30 tablet 4  . NAPROXEN 500 MG PO TABS Oral Take 1 tablet (500 mg total) by mouth 2 (two) times daily. 30 tablet 0    BP 133/67  Pulse 76  Temp 97.9 F (36.6 C) (Oral)  Resp 18  Wt 185 lb (83.915 kg)  SpO2 98%  Physical Exam  Constitutional: John [name] appears well-developed and well-nourished.  HENT:  Head: Atraumatic.  Neck: Normal range of motion.  Cardiovascular:       Pulses equal bilaterally  Musculoskeletal: John [name] exhibits tenderness. John [name] exhibits no edema.       Right hand: John [name] exhibits tenderness and swelling. John [name] exhibits normal two-point discrimination, normal capillary refill and no deformity. normal sensation noted. Normal strength noted. John [name] exhibits no thumb/finger opposition.       Pain with resisted right  thumb extension.  Tender to palpation along extensor tendon with slight edema only appreciable when compared to left thumb.  No erythema, skin intact.  Neurological: John [name] is alert. John [name] has normal strength. John [name] displays normal reflexes. No sensory deficit.       Equal strength  Skin: Skin is warm and dry.  Psychiatric: John [name] has a normal mood and affect.    ED Course  Procedures (including critical care time)  Labs Reviewed - No data to display Dg Hand Complete Right  04/12/2012  *RADIOLOGY REPORT*  Clinical Data: 51 year old male pain at the base of the thumb radiating to the second metacarpal area.  RIGHT HAND - COMPLETE 3+ VIEW  Comparison: None.  Findings: Bone mineralization is within normal limits.  Distal radius and ulna appear  intact.  Carpal bone alignment within normal limits.  Carpal joint spaces preserved.  Metacarpals intact.  MCP and distal joint spaces within normal limits.  Chronic deformity of the fourth distal phalanx tuft.  No acute fracture or dislocation.  IMPRESSION: No acute osseous abnormality identified about the right hand.   Original Report Authenticated By: Harley Hallmark, M.D.      1. Tendonitis       MDM  Patients labs and/or radiological studies were reviewed during the medical decision making and disposition process. Patient placed in right thumb spica splint.  John [name] was encouraged to use ice therapy followed by heat therapy and was prescribed Naprosyn.  Work restrictions given for the next week with instructions to followup with his PCP for recheck in one week for further management if his symptoms are not improved.        Burgess Amor, PA 04/12/12 1011

## 2012-04-12 NOTE — ED Notes (Signed)
Has had trouble with hand in past, last night hand started to swell and increased pain

## 2012-04-13 NOTE — ED Provider Notes (Signed)
Medical screening examination/treatment/procedure(s) were performed by non-physician practitioner and as supervising physician I was immediately available for consultation/collaboration.   Joseeduardo Brix III, MD 04/13/12 1654 

## 2012-06-03 ENCOUNTER — Other Ambulatory Visit: Payer: Self-pay | Admitting: Family Medicine

## 2012-07-05 ENCOUNTER — Other Ambulatory Visit: Payer: Self-pay | Admitting: Family Medicine

## 2012-07-11 ENCOUNTER — Encounter: Payer: Self-pay | Admitting: Family Medicine

## 2012-07-11 ENCOUNTER — Ambulatory Visit (INDEPENDENT_AMBULATORY_CARE_PROVIDER_SITE_OTHER): Payer: PRIVATE HEALTH INSURANCE | Admitting: Family Medicine

## 2012-07-11 VITALS — BP 142/90 | HR 86 | Resp 18 | Ht 68.0 in | Wt 188.0 lb

## 2012-07-11 DIAGNOSIS — M792 Neuralgia and neuritis, unspecified: Secondary | ICD-10-CM | POA: Insufficient documentation

## 2012-07-11 DIAGNOSIS — I10 Essential (primary) hypertension: Secondary | ICD-10-CM

## 2012-07-11 DIAGNOSIS — Z125 Encounter for screening for malignant neoplasm of prostate: Secondary | ICD-10-CM

## 2012-07-11 DIAGNOSIS — E785 Hyperlipidemia, unspecified: Secondary | ICD-10-CM

## 2012-07-11 DIAGNOSIS — F329 Major depressive disorder, single episode, unspecified: Secondary | ICD-10-CM

## 2012-07-11 DIAGNOSIS — M5412 Radiculopathy, cervical region: Secondary | ICD-10-CM

## 2012-07-11 DIAGNOSIS — R0789 Other chest pain: Secondary | ICD-10-CM

## 2012-07-11 LAB — COMPREHENSIVE METABOLIC PANEL
Albumin: 5 g/dL (ref 3.5–5.2)
Alkaline Phosphatase: 80 U/L (ref 39–117)
BUN: 22 mg/dL (ref 6–23)
CO2: 30 mEq/L (ref 19–32)
Glucose, Bld: 95 mg/dL (ref 70–99)
Potassium: 4.2 mEq/L (ref 3.5–5.3)
Total Bilirubin: 0.7 mg/dL (ref 0.3–1.2)
Total Protein: 8.2 g/dL (ref 6.0–8.3)

## 2012-07-11 LAB — LIPID PANEL
Cholesterol: 181 mg/dL (ref 0–200)
HDL: 58 mg/dL (ref >39)
Total CHOL/HDL Ratio: 3.1 Ratio

## 2012-07-11 MED ORDER — METHYLPREDNISOLONE ACETATE 80 MG/ML IJ SUSP
80.0000 mg | Freq: Once | INTRAMUSCULAR | Status: AC
  Administered 2012-07-11: 80 mg via INTRAMUSCULAR

## 2012-07-11 MED ORDER — PREDNISONE (PAK) 5 MG PO TABS: 5.0000 mg | ORAL_TABLET | ORAL | Status: DC

## 2012-07-11 MED ORDER — ALPRAZOLAM 1 MG PO TABS: 1.0000 mg | ORAL_TABLET | Freq: Every evening | ORAL | Status: DC | PRN

## 2012-07-11 MED ORDER — KETOROLAC TROMETHAMINE 60 MG/2ML IJ SOLN
60.0000 mg | Freq: Once | INTRAMUSCULAR | Status: AC
  Administered 2012-07-11: 60 mg via INTRAMUSCULAR

## 2012-07-11 MED ORDER — IBUPROFEN 800 MG PO TABS: 800.0000 mg | ORAL_TABLET | Freq: Three times a day (TID) | ORAL | Status: DC | PRN

## 2012-07-11 NOTE — Progress Notes (Signed)
  Subjective:    Patient ID: John [name], male    DOB: 1960/08/09, 52 y.o.   MRN: [id number]  HPI 3 week h/o right chest pain radiating down right upper ext to fingers, occurs at rest.no associated nausea, diaphoresis or light headedness, no specific aggravating or relieving factors noted. Still smokes cigars , unwilling to quit though not a regular habit. Denies recent fever, chills , head or chest congestion Unfortunately admits to forgetting medications at times this is evident today, blood pressure is elevated   Review of Systems See HPI Denies recent fever or chills. Denies sinus pressure, nasal congestion, ear pain or sore throat. Denies chest congestion, productive cough or wheezing. Denies PND, orthopnea palpitations and leg swelling Denies abdominal pain, nausea, vomiting,diarrhea or constipation.   Denies dysuria, frequency, hesitancy or incontinence. Denies joint pain, swelling and limitation in mobility. Denies headaches, seizures, numbness, or tingling. Denies uncontrolled  depression, anxiety or insomnia. Denies skin break down or rash.        Objective:   Physical Exam  Patient alert and oriented and in no cardiopulmonary distress.  HEENT: No facial asymmetry, EOMI, no sinus tenderness,  oropharynx pink and moist.  Neck supple no adenopathy.  Chest: Clear to auscultation bilaterally.no reproducible chest wall pain  CVS: S1, S2 no murmurs, no S3.  ABD: Soft non tender. Bowel sounds normal.  Ext: No edema  MS: Adequate ROM spine, shoulders, hips and knees.  Skin: Intact, no ulcerations or rash noted.  Psych: Good eye contact, normal affect. Memory intact not anxious or depressed appearing.  CNS: CN 2-12 intact, power, tone and sensation normal throughout.       Assessment & Plan:

## 2012-07-11 NOTE — Assessment & Plan Note (Signed)
Controlled, no change in medication  

## 2012-07-11 NOTE — Patient Instructions (Addendum)
CPE July 30 or after.  You are being treated for nerve irritation causing chest pain, you will get toradol 60 mg and depo medrol 80 mg IM in the office, also medications are sent to your pharmacy.If pain persists, please call, you will be referred for further investigation.  EKG today shows no sign of heart damage. OK to take all meds at the same time, so you do not forget to take any.  Blood pressure today is high, and it is clear you are not taking the meds every day as prescribed   Lipid, cmp today  Fasting cbc, lipid, cmp PSA July 23 or after

## 2012-07-11 NOTE — Assessment & Plan Note (Signed)
New onset right chest and right upper extremity pain, anti inflammatories in office and prescribed. Pt to call back if unchanged for referral for further eval

## 2012-07-11 NOTE — Assessment & Plan Note (Signed)
Improved, no med change, commitment to taking daily is stressed. Hyperlipidemia:Low fat diet discussed and encouraged.

## 2012-07-11 NOTE — Assessment & Plan Note (Signed)
Uncontrolled, pt forgets to take medications at times DASH diet and commitment to daily physical activity for a minimum of 30 minutes discussed and encouraged, as a part of hypertension management. The importance of attaining a healthy weight is also discussed.

## 2012-07-11 NOTE — Assessment & Plan Note (Addendum)
Office EKG done, nSR, no ischemic changes, pt reassured, still advised to stop smoking entirely

## 2012-07-20 ENCOUNTER — Encounter: Payer: Self-pay | Admitting: Family Medicine

## 2012-10-19 ENCOUNTER — Other Ambulatory Visit: Payer: Self-pay | Admitting: Family Medicine

## 2012-12-21 ENCOUNTER — Other Ambulatory Visit: Payer: Self-pay | Admitting: Family Medicine

## 2013-01-12 ENCOUNTER — Other Ambulatory Visit: Payer: Self-pay | Admitting: Family Medicine

## 2013-01-16 ENCOUNTER — Encounter: Payer: Self-pay | Admitting: Family Medicine

## 2013-01-17 ENCOUNTER — Ambulatory Visit (INDEPENDENT_AMBULATORY_CARE_PROVIDER_SITE_OTHER): Payer: PRIVATE HEALTH INSURANCE | Admitting: Family Medicine

## 2013-01-17 ENCOUNTER — Encounter: Payer: Self-pay | Admitting: Family Medicine

## 2013-01-17 VITALS — BP 140/90 | HR 93 | Resp 16 | Ht 68.0 in | Wt 194.0 lb

## 2013-01-17 DIAGNOSIS — Z1211 Encounter for screening for malignant neoplasm of colon: Secondary | ICD-10-CM

## 2013-01-17 DIAGNOSIS — Z125 Encounter for screening for malignant neoplasm of prostate: Secondary | ICD-10-CM

## 2013-01-17 DIAGNOSIS — E785 Hyperlipidemia, unspecified: Secondary | ICD-10-CM

## 2013-01-17 DIAGNOSIS — G4700 Insomnia, unspecified: Secondary | ICD-10-CM | POA: Insufficient documentation

## 2013-01-17 DIAGNOSIS — Z0000 Encounter for general adult medical examination without abnormal findings: Secondary | ICD-10-CM

## 2013-01-17 DIAGNOSIS — R7301 Impaired fasting glucose: Secondary | ICD-10-CM

## 2013-01-17 DIAGNOSIS — I10 Essential (primary) hypertension: Secondary | ICD-10-CM

## 2013-01-17 DIAGNOSIS — F329 Major depressive disorder, single episode, unspecified: Secondary | ICD-10-CM

## 2013-01-17 LAB — POC HEMOCCULT BLD/STL (OFFICE/1-CARD/DIAGNOSTIC): Fecal Occult Blood, POC: NEGATIVE

## 2013-01-17 MED ORDER — PRAVASTATIN SODIUM 40 MG PO TABS: 40.0000 mg | ORAL_TABLET | Freq: Every evening | ORAL | Status: AC

## 2013-01-17 MED ORDER — ASPIRIN EC 81 MG PO TBEC: 81.0000 mg | DELAYED_RELEASE_TABLET | Freq: Every day | ORAL | Status: DC

## 2013-01-17 MED ORDER — FLUOXETINE HCL 10 MG PO CAPS: ORAL_CAPSULE | ORAL | Status: DC

## 2013-01-17 MED ORDER — TEMAZEPAM 7.5 MG PO CAPS: 7.5000 mg | ORAL_CAPSULE | Freq: Every evening | ORAL | Status: DC | PRN

## 2013-01-17 MED ORDER — ALPRAZOLAM 0.5 MG PO TABS: ORAL_TABLET | ORAL | Status: AC

## 2013-01-17 NOTE — Progress Notes (Signed)
  Subjective:    Patient ID: John [name], male    DOB: 1960-10-05, 52 y.o.   MRN: [id number]  HPI Pt in for annual exam C/o poor sleep, needing to use increased xanax, which he was started on years ago when he lost his son. Discussed sleep hygiene and switching to a more appropriate less addictive medication, and he is definitely interested. Also has been sleeping with the TV on which needs to be discontinued. Unable to afford crestor , so taking it inconsistently No regular commitment to exercise, needs to do this as his weight is increased and BP not at goal   Review of Systems See HPI Denies recent fever or chills. Denies sinus pressure, nasal congestion, ear pain or sore throat. Denies chest congestion, productive cough or wheezing. Denies chest pains, palpitations and leg swelling Denies abdominal pain, nausea, vomiting,diarrhea or constipation.   Denies dysuria, frequency, hesitancy or incontinence. Denies joint pain, swelling and limitation in mobility. Denies headaches, seizures, numbness, or tingling.  Denies skin break down or rash.        Objective:   Physical Exam Pleasant well nourished male, alert and oriented x 3, in no cardio-pulmonary distress. Afebrile. HEENT No facial trauma or asymetry. Sinuses non tender. EOMI, PERTL, fundoscopic exam is negative for hemorhages or exudates. External ears normal, tympanic membranes clear. Oropharynx moist, no exudate,no teeth Neck: supple, no adenopathy,JVD or thyromegaly.No bruits.  Chest: Clear to ascultation bilaterally.No crackles or wheezes. Non tender to palpation  Breast: No asymetry,no masses. No nipple discharge or inversion. No axillary or supraclavicular adenopathy  Cardiovascular system; Heart sounds normal,  S1 and  S2 ,no S3.  No murmur, or thrill. Apical beat not displaced Peripheral pulses normal.  Abdomen: Soft, non tender, no organomegaly or masses. No bruits. Bowel sounds normal. No  guarding, tenderness or rebound.  Rectal:  No mass. guaiac negative stool. Prostate smooth and firm  Musculoskeletal exam: Full ROM of spine, hips , shoulders and knees. No deformity ,swelling or crepitus noted. No muscle wasting or atrophy.   Neurologic: Cranial nerves 2 to 12 intact. Power, tone ,sensation and reflexes normal throughout. No disturbance in gait. No tremor.  Skin: Intact, no ulceration, erythema , scaling or rash noted. Pigmentation normal throughout  Psych; Normal mood and affect. Judgement and concentration normal         Assessment & Plan:

## 2013-01-17 NOTE — Patient Instructions (Addendum)
F/u in 4 month, call if you need me before  Fasting lipid, cmp, pSA and CBc today/asap  Fasting lipid and cmp in 4 month  Cholesterol pill is changed due to cost.Stop crestor, new is pravastatin.Please cut back on fried and fatty foods  New for sleep is restoril at bedtime, xanax dose is cut back and use only if needed.  Start aspirin 81mg  one daily to reduce risk of heart disease  Insomnia Insomnia is frequent trouble falling and/or staying asleep. Insomnia can be a long term problem or a short term problem. Both are common. Insomnia can be a short term problem when the wakefulness is related to a certain stress or worry. Long term insomnia is often related to ongoing stress during waking hours and/or poor sleeping habits. Overtime, sleep deprivation itself can make the problem worse. Every little thing feels more severe because you are overtired and your ability to cope is decreased. CAUSES   Stress, anxiety, and depression.  Poor sleeping habits.  Distractions such as TV in the bedroom.  Naps close to bedtime.  Engaging in emotionally charged conversations before bed.  Technical reading before sleep.  Alcohol and other sedatives. They may make the problem worse. They can hurt normal sleep patterns and normal dream activity.  Stimulants such as caffeine for several hours prior to bedtime.  Pain syndromes and shortness of breath can cause insomnia.  Exercise late at night.  Changing time zones may cause sleeping problems (jet lag). It is sometimes helpful to have someone observe your sleeping patterns. They should look for periods of not breathing during the night (sleep apnea). They should also look to see how long those periods last. If you live alone or observers are uncertain, you can also be observed at a sleep clinic where your sleep patterns will be professionally monitored. Sleep apnea requires a checkup and treatment. Give your caregivers your medical history. Give your  caregivers observations your family has made about your sleep.  SYMPTOMS   Not feeling rested in the morning.  Anxiety and restlessness at bedtime.  Difficulty falling and staying asleep. TREATMENT   Your caregiver may prescribe treatment for an underlying medical disorders. Your caregiver can give advice or help if you are using alcohol or other drugs for self-medication. Treatment of underlying problems will usually eliminate insomnia problems.  Medications can be prescribed for short time use. They are generally not recommended for lengthy use.  Over-the-counter sleep medicines are not recommended for lengthy use. They can be habit forming.  You can promote easier sleeping by making lifestyle changes such as:  Using relaxation techniques that help with breathing and reduce muscle tension.  Exercising earlier in the day.  Changing your diet and the time of your last meal. No night time snacks.  Establish a regular time to go to bed.  Counseling can help with stressful problems and worry.  Soothing music and white noise may be helpful if there are background noises you cannot remove.  Stop tedious detailed work at least one hour before bedtime. HOME CARE INSTRUCTIONS   Keep a diary. Inform your caregiver about your progress. This includes any medication side effects. See your caregiver regularly. Take note of:  Times when you are asleep.  Times when you are awake during the night.  The quality of your sleep.  How you feel the next day. This information will help your caregiver care for you.  Get out of bed if you are still awake after 15 minutes.  Read or do some quiet activity. Keep the lights down. Wait until you feel sleepy and go back to bed.  Keep regular sleeping and waking hours. Avoid naps.  Exercise regularly.  Avoid distractions at bedtime. Distractions include watching television or engaging in any intense or detailed activity like attempting to balance  the household checkbook.  Develop a bedtime ritual. Keep a familiar routine of bathing, brushing your teeth, climbing into bed at the same time each night, listening to soothing music. Routines increase the success of falling to sleep faster.  Use relaxation techniques. This can be using breathing and muscle tension release routines. It can also include visualizing peaceful scenes. You can also help control troubling or intruding thoughts by keeping your mind occupied with boring or repetitive thoughts like the old concept of counting sheep. You can make it more creative like imagining planting one beautiful flower after another in your backyard garden.  During your day, work to eliminate stress. When this is not possible use some of the previous suggestions to help reduce the anxiety that accompanies stressful situations. MAKE SURE YOU:   Understand these instructions.  Will watch your condition.  Will get help right away if you are not doing well or get worse. Document Released: 06/03/2000 Document Revised: 08/29/2011 Document Reviewed: 07/04/2007 [employer] Patient Information 2014 [place], [place].

## 2013-01-18 ENCOUNTER — Telehealth: Payer: Self-pay | Admitting: Family Medicine

## 2013-01-19 NOTE — Assessment & Plan Note (Signed)
Annual exam completed as documented Importance of regular exercise, with low fat, low carb diet and attention to weight loss discussed. Importance of sufficient good quality sleep also discussed

## 2013-01-19 NOTE — Assessment & Plan Note (Signed)
Hyperlipidemia:Low fat diet discussed and encouraged.  Updated lab needed. Change medication due to cost, has not been consistent in current script of crestor

## 2013-01-19 NOTE — Assessment & Plan Note (Signed)
Controlled, no change in medication  

## 2013-01-19 NOTE — Assessment & Plan Note (Signed)
Uncontrolled Sleep hygiene discussed. New is restoril, plan to d/c xanax

## 2013-01-19 NOTE — Assessment & Plan Note (Signed)
Uncontrolled, no med change DASH diet and commitment to daily physical activity for a minimum of 30 minutes discussed and encouraged, as a part of hypertension management. The importance of attaining a healthy weight is also discussed.  

## 2013-01-19 NOTE — Assessment & Plan Note (Signed)
Patient educated about the importance of limiting  Carbohydrate intake , the need to commit to daily physical activity for a minimum of 30 minutes , and to commit weight loss. The fact that changes in all these areas will reduce or eliminate all together the development of diabetes is stressed.   Updated lab needed 

## 2013-01-23 ENCOUNTER — Other Ambulatory Visit: Payer: Self-pay | Admitting: Family Medicine

## 2013-01-25 NOTE — Telephone Encounter (Signed)
Returned patient call and he has no further questions.

## 2013-01-30 ENCOUNTER — Telehealth: Payer: Self-pay | Admitting: Family Medicine

## 2013-01-30 NOTE — Telephone Encounter (Signed)
Is this ok?

## 2013-04-03 ENCOUNTER — Other Ambulatory Visit: Payer: Self-pay | Admitting: Family Medicine

## 2013-05-20 ENCOUNTER — Encounter (INDEPENDENT_AMBULATORY_CARE_PROVIDER_SITE_OTHER): Payer: Self-pay

## 2013-05-20 ENCOUNTER — Ambulatory Visit (INDEPENDENT_AMBULATORY_CARE_PROVIDER_SITE_OTHER): Payer: PRIVATE HEALTH INSURANCE | Admitting: Family Medicine

## 2013-05-20 ENCOUNTER — Encounter: Payer: Self-pay | Admitting: Family Medicine

## 2013-05-20 VITALS — BP 140/90 | HR 84 | Resp 16 | Wt 207.0 lb

## 2013-05-20 DIAGNOSIS — I10 Essential (primary) hypertension: Secondary | ICD-10-CM

## 2013-05-20 DIAGNOSIS — E785 Hyperlipidemia, unspecified: Secondary | ICD-10-CM

## 2013-05-20 DIAGNOSIS — Z23 Encounter for immunization: Secondary | ICD-10-CM

## 2013-05-20 DIAGNOSIS — E669 Obesity, unspecified: Secondary | ICD-10-CM

## 2013-05-20 DIAGNOSIS — F329 Major depressive disorder, single episode, unspecified: Secondary | ICD-10-CM

## 2013-05-20 DIAGNOSIS — G4700 Insomnia, unspecified: Secondary | ICD-10-CM

## 2013-05-20 MED ORDER — FLUOXETINE HCL (PMDD) 20 MG PO CAPS: 1.0000 | ORAL_CAPSULE | Freq: Every day | ORAL | Status: DC

## 2013-05-20 MED ORDER — TEMAZEPAM 15 MG PO CAPS: 15.0000 mg | ORAL_CAPSULE | Freq: Every evening | ORAL | Status: DC | PRN

## 2013-05-20 MED ORDER — ALPRAZOLAM 0.5 MG PO TABS: 0.5000 mg | ORAL_TABLET | Freq: Every evening | ORAL | Status: DC | PRN

## 2013-05-20 NOTE — Patient Instructions (Addendum)
F/u in 2 month, call if you need me before  Increase in fluoxetine to 20mg  daily, OK to take TWO 10mg  capsules daily till done  Increase in restoril to 15 mg daily, new script at pharmacy  Start daily exercise.  Star working on [employer] as discussed.  Yo are referred to therapy  Blood pressure elevated due to weight gain and inactivity, no change in medication , change eating as discussed  Flu vaccine today

## 2013-05-20 NOTE — Assessment & Plan Note (Addendum)
Uncontrolled, needs increased med dose. Refer to therapy, dose increase in medication, not suicidal or homicidal

## 2013-05-20 NOTE — Assessment & Plan Note (Signed)
Uncontrolled, no change in med dose  .DASH diet and commitment to daily physical activity for a minimum of 30 minutes discussed and encouraged, as a part of hypertension management. The importance of attaining a healthy weight is also discussed.

## 2013-05-20 NOTE — Progress Notes (Signed)
   Subjective:    Patient ID: John [name], male    DOB: Jan 01, 1961, 52 y.o.   MRN: [id number]  HPI The PT is here for follow up and re-evaluation of chronic medical conditions, medication management and review of any available recent lab and radiology data.  Preventive health is updated, specifically  Cancer screening and Immunization.   . The PT denies any adverse reactions to current medications since the last visit.  C/o deterioration in depression, was "fired" due to poor performance on the job approx 2.5 months ago, still recalls clearly the untimely death of his son and has never had therapy for this. Overeating, withdrawn and unable to focus, not suicidal or homicidal    Review of Systems See HPI Denies recent fever or chills. Denies sinus pressure, nasal congestion, ear pain or sore throat. Denies chest congestion, productive cough or wheezing. Denies chest pains, palpitations and leg swelling Denies abdominal pain, nausea, vomiting,diarrhea or constipation.   Denies dysuria, frequency, hesitancy or incontinence. Denies joint pain, swelling and limitation in mobility. Denies headaches, seizures, numbness, or tingling.  Denies skin break down or rash.        Objective:   Physical Exam  Patient alert and oriented and in no cardiopulmonary distress.  HEENT: No facial asymmetry, EOMI, no sinus tenderness,  oropharynx pink and moist.  Neck supple no adenopathy.  Chest: Clear to auscultation bilaterally.  CVS: S1, S2 no murmurs, no S3.  ABD: Soft non tender. Bowel sounds normal.  Ext: No edema  MS: Adequate ROM spine, shoulders, hips and knees.  Skin: Intact, no ulcerations or rash noted.  Psych: Good eye contact,flat  affect. Memory intact not anxious but  depressed appearing.  CNS: CN 2-12 intact, power, tone and sensation normal throughout.       Assessment & Plan:

## 2013-05-26 DIAGNOSIS — E6609 Other obesity due to excess calories: Secondary | ICD-10-CM | POA: Insufficient documentation

## 2013-05-26 NOTE — Assessment & Plan Note (Signed)
Deteriorated. Patient re-educated about  the importance of commitment to a  minimum of 150 minutes of exercise per week. The importance of healthy food choices with portion control discussed. Encouraged to start a food diary, count calories and to consider  joining a support group. Sample diet sheets offered. Goals set by the patient for the next several months.    

## 2013-05-26 NOTE — Assessment & Plan Note (Signed)
Uncontrolled, increase dose of restoril Sleep hygiene discussed also

## 2013-05-26 NOTE — Assessment & Plan Note (Signed)
Uncontrolled, updated lab needed Hyperlipidemia:Low fat diet discussed and encouraged.   

## 2013-06-18 ENCOUNTER — Ambulatory Visit (HOSPITAL_COMMUNITY): Payer: Self-pay | Admitting: Psychiatry

## 2013-07-21 ENCOUNTER — Other Ambulatory Visit: Payer: Self-pay | Admitting: Family Medicine

## 2013-07-22 ENCOUNTER — Ambulatory Visit (INDEPENDENT_AMBULATORY_CARE_PROVIDER_SITE_OTHER): Payer: PRIVATE HEALTH INSURANCE | Admitting: Family Medicine

## 2013-07-22 ENCOUNTER — Encounter (INDEPENDENT_AMBULATORY_CARE_PROVIDER_SITE_OTHER): Payer: Self-pay

## 2013-07-22 ENCOUNTER — Encounter: Payer: Self-pay | Admitting: Family Medicine

## 2013-07-22 VITALS — BP 160/100 | HR 82 | Resp 18 | Ht 68.0 in | Wt 206.0 lb

## 2013-07-22 DIAGNOSIS — F3289 Other specified depressive episodes: Secondary | ICD-10-CM

## 2013-07-22 DIAGNOSIS — I10 Essential (primary) hypertension: Secondary | ICD-10-CM

## 2013-07-22 DIAGNOSIS — E785 Hyperlipidemia, unspecified: Secondary | ICD-10-CM

## 2013-07-22 DIAGNOSIS — F329 Major depressive disorder, single episode, unspecified: Secondary | ICD-10-CM

## 2013-07-22 DIAGNOSIS — G4700 Insomnia, unspecified: Secondary | ICD-10-CM

## 2013-07-22 DIAGNOSIS — Z125 Encounter for screening for malignant neoplasm of prostate: Secondary | ICD-10-CM

## 2013-07-22 DIAGNOSIS — E669 Obesity, unspecified: Secondary | ICD-10-CM

## 2013-07-22 DIAGNOSIS — R7301 Impaired fasting glucose: Secondary | ICD-10-CM

## 2013-07-22 MED ORDER — TEMAZEPAM 30 MG PO CAPS: 30.0000 mg | ORAL_CAPSULE | Freq: Every evening | ORAL | Status: DC | PRN

## 2013-07-22 MED ORDER — TRIAMTERENE-HCTZ 75-50 MG PO TABS: 1.0000 | ORAL_TABLET | Freq: Every day | ORAL | Status: DC

## 2013-07-22 NOTE — Patient Instructions (Signed)
F/u in 3 month, call if you need me before  Dose increase in blood pressure medication, also in medication for sleep, collect new scripts today for both please.  I am happy that you are doing much better.  Please commit to exercise 7 days per week , for a minimum of 30 minutes daily  Please cut back on intake , you need to aim to lose 2 pounds per month, you have gained 20 pounds in the past year, and you need to lose this   Labs today, CBC, lipid, CMP, HBA1C TSH , PSA

## 2013-07-22 NOTE — Assessment & Plan Note (Signed)
Uncontrolled DASH diet and commitment to daily physical activity for a minimum of 30 minutes discussed and encouraged, as a part of hypertension management. The importance of attaining a healthy weight is also discussed.  

## 2013-08-06 NOTE — Assessment & Plan Note (Signed)
Uncontrolled. Sleep hygiene reviewed and written information offered also. Prescription sent for  medication needed.Dose increased

## 2013-08-06 NOTE — Assessment & Plan Note (Signed)
Patient educated about the importance of limiting  Carbohydrate intake , the need to commit to daily physical activity for a minimum of 30 minutes , and to commit weight loss. The fact that changes in all these areas will reduce or eliminate all together the development of diabetes is stressed.   Updated lab needed at/ before next visit.  

## 2013-08-06 NOTE — Progress Notes (Signed)
   Subjective:    Patient ID: Marye RoundGary K Tribby, male    DOB: Dec 30, 1960, 53 y.o.   MRN: [id number]  HPI The PT is here for follow up and re-evaluation of chronic medical conditions, medication management and review of any available recent lab and radiology data.  Preventive health is updated, specifically  Cancer screening and Immunization.    The PT denies any adverse reactions to current medications since the last visit. Very pleased with prozac, no adverse s/e, denies any symptoms of depression. Does state sleep is not being adequately addressed and verifies the need to continue bedtime xanax There are no new concerns.       Review of Systems See HPI Denies recent fever or chills. Denies sinus pressure, nasal congestion, ear pain or sore throat. Denies chest congestion, productive cough or wheezing. Denies chest pains, palpitations and leg swelling Denies abdominal pain, nausea, vomiting,diarrhea or constipation.   Denies dysuria, frequency, hesitancy or incontinence. Denies joint pain, swelling and limitation in mobility. Denies headaches, seizures, numbness, or tingling. Denies skin break down or rash.        Objective:   Physical Exam  BP 160/100  Pulse 82  Resp 18  Ht 5\' 8"  (1.727 m)  Wt 206 lb (93.441 kg)  BMI 31.33 kg/m2  SpO2 97% Patient alert and oriented and in no cardiopulmonary distress.  HEENT: No facial asymmetry, EOMI, no sinus tenderness,  oropharynx pink and moist.  Neck supple no adenopathy.  Chest: Clear to auscultation bilaterally.  CVS: S1, S2 no murmurs, no S3.  ABD: Soft non tender. Bowel sounds normal.  Ext: No edema  MS: Adequate ROM spine, shoulders, hips and knees.  Skin: Intact, no ulcerations or rash noted.  Psych: Good eye contact, normal affect. Memory intact not anxious or depressed appearing.  CNS: CN 2-12 intact, power, tone and sensation normal throughout.       Assessment & Plan:  HYPERTENSION Uncontrolled. DASH  diet and commitment to daily physical activity for a minimum of 30 minutes discussed and encouraged, as a part of hypertension management. The importance of attaining a healthy weight is also discussed.   IMPAIRED FASTING GLUCOSE Patient educated about the importance of limiting  Carbohydrate intake , the need to commit to daily physical activity for a minimum of 30 minutes , and to commit weight loss. The fact that changes in all these areas will reduce or eliminate all together the development of diabetes is stressed.   Updated lab needed at/ before next visit.   Insomnia Uncontrolled. Sleep hygiene reviewed and written information offered also. Prescription sent for  medication needed.Dose increased    Obesity, Class I, BMI 30-34.9 Deteriorated. Patient re-educated about  the importance of commitment to a  minimum of 150 minutes of exercise per week. The importance of healthy food choices with portion control discussed. Encouraged to start a food diary, count calories and to consider  joining a support group. Sample diet sheets offered. Goals set by the patient for the next several months.

## 2013-08-06 NOTE — Assessment & Plan Note (Signed)
Deteriorated. Patient re-educated about  the importance of commitment to a  minimum of 150 minutes of exercise per week. The importance of healthy food choices with portion control discussed. Encouraged to start a food diary, count calories and to consider  joining a support group. Sample diet sheets offered. Goals set by the patient for the next several months.    

## 2013-08-18 ENCOUNTER — Other Ambulatory Visit: Payer: Self-pay | Admitting: Family Medicine

## 2013-09-18 ENCOUNTER — Other Ambulatory Visit: Payer: Self-pay | Admitting: Family Medicine

## 2013-10-22 ENCOUNTER — Ambulatory Visit (INDEPENDENT_AMBULATORY_CARE_PROVIDER_SITE_OTHER): Payer: PRIVATE HEALTH INSURANCE | Admitting: Family Medicine

## 2013-10-22 ENCOUNTER — Encounter: Payer: Self-pay | Admitting: Family Medicine

## 2013-10-22 ENCOUNTER — Other Ambulatory Visit: Payer: Self-pay | Admitting: Family Medicine

## 2013-10-22 ENCOUNTER — Encounter (INDEPENDENT_AMBULATORY_CARE_PROVIDER_SITE_OTHER): Payer: Self-pay

## 2013-10-22 VITALS — BP 130/84 | HR 95 | Resp 16 | Ht 68.0 in | Wt 199.1 lb

## 2013-10-22 DIAGNOSIS — E785 Hyperlipidemia, unspecified: Secondary | ICD-10-CM | POA: Diagnosis not present

## 2013-10-22 DIAGNOSIS — F329 Major depressive disorder, single episode, unspecified: Secondary | ICD-10-CM | POA: Diagnosis not present

## 2013-10-22 DIAGNOSIS — I10 Essential (primary) hypertension: Secondary | ICD-10-CM | POA: Diagnosis not present

## 2013-10-22 DIAGNOSIS — R05 Cough: Secondary | ICD-10-CM

## 2013-10-22 DIAGNOSIS — F3289 Other specified depressive episodes: Secondary | ICD-10-CM | POA: Diagnosis not present

## 2013-10-22 DIAGNOSIS — R059 Cough, unspecified: Secondary | ICD-10-CM | POA: Diagnosis not present

## 2013-10-22 DIAGNOSIS — R7301 Impaired fasting glucose: Secondary | ICD-10-CM

## 2013-10-22 LAB — COMPREHENSIVE METABOLIC PANEL
ALT: 79 U/L — AB (ref 0–53)
AST: 62 U/L — AB (ref 0–37)
Albumin: 4.3 g/dL (ref 3.5–5.2)
Alkaline Phosphatase: 115 U/L (ref 39–117)
BUN: 18 mg/dL (ref 6–23)
CALCIUM: 9.9 mg/dL (ref 8.4–10.5)
CHLORIDE: 96 meq/L (ref 96–112)
CO2: 28 mEq/L (ref 19–32)
Creat: 1.64 mg/dL — ABNORMAL HIGH (ref 0.50–1.35)
Glucose, Bld: 167 mg/dL — ABNORMAL HIGH (ref 70–99)
Potassium: 3.7 mEq/L (ref 3.5–5.3)
Sodium: 134 mEq/L — ABNORMAL LOW (ref 135–145)
Total Bilirubin: 1.1 mg/dL (ref 0.2–1.2)
Total Protein: 7.8 g/dL (ref 6.0–8.3)

## 2013-10-22 LAB — LIPID PANEL
Cholesterol: 215 mg/dL — ABNORMAL HIGH (ref 0–200)
HDL: 36 mg/dL — ABNORMAL LOW (ref >39)
LDL Cholesterol: 143 mg/dL — ABNORMAL HIGH (ref 0–99)
Total CHOL/HDL Ratio: 6 Ratio
Triglycerides: 182 mg/dL — ABNORMAL HIGH (ref <150)
VLDL: 36 mg/dL (ref 0–40)

## 2013-10-22 MED ORDER — FLUOXETINE HCL 40 MG PO CAPS: 40.0000 mg | ORAL_CAPSULE | Freq: Every day | ORAL | Status: DC

## 2013-10-22 NOTE — Patient Instructions (Signed)
Annual exam mid to end July, call if you need me before  Increase fluoxetine to 40mg  daily, take two 20mg  capsules daily till done  Commit to exercise every day, 30 mins  I DO hope that you will soon find employment, keep looking  For cough, use Nyquil, if worsens in the next week with fever, chills, body aches, geren sputum call , I will prescribe an antibiotic, no indication for that now  You are doing better, congrats on weight loss, keep it up

## 2013-10-23 ENCOUNTER — Other Ambulatory Visit: Payer: Self-pay

## 2013-10-23 DIAGNOSIS — E785 Hyperlipidemia, unspecified: Secondary | ICD-10-CM

## 2013-10-23 LAB — HEPATITIS PANEL, ACUTE
HCV AB: NEGATIVE
Hep A IgM: NONREACTIVE
Hep B C IgM: NONREACTIVE
Hepatitis B Surface Ag: NEGATIVE

## 2013-10-23 MED ORDER — PRAVASTATIN SODIUM 80 MG PO TABS: 80.0000 mg | ORAL_TABLET | Freq: Every day | ORAL | Status: DC

## 2013-10-23 NOTE — Addendum Note (Signed)
Addended by: Kandis FantasiaSLADE, COURTNEY B on: 10/23/2013 11:48 AM   Modules accepted: Orders

## 2013-10-24 LAB — HEMOGLOBIN A1C
HEMOGLOBIN A1C: 7.6 % — AB (ref <5.7)
MEAN PLASMA GLUCOSE: 171 mg/dL — AB (ref <117)

## 2013-10-30 ENCOUNTER — Other Ambulatory Visit: Payer: Self-pay

## 2013-10-30 MED ORDER — GLIPIZIDE ER 5 MG PO TB24: 5.0000 mg | ORAL_TABLET | Freq: Every day | ORAL | Status: DC

## 2013-11-15 ENCOUNTER — Other Ambulatory Visit: Payer: Self-pay | Admitting: Family Medicine

## 2013-11-26 ENCOUNTER — Encounter: Payer: Self-pay | Admitting: Family Medicine

## 2013-11-26 ENCOUNTER — Other Ambulatory Visit: Payer: Self-pay

## 2013-11-26 ENCOUNTER — Ambulatory Visit (INDEPENDENT_AMBULATORY_CARE_PROVIDER_SITE_OTHER): Payer: PRIVATE HEALTH INSURANCE | Admitting: Family Medicine

## 2013-11-26 ENCOUNTER — Encounter (INDEPENDENT_AMBULATORY_CARE_PROVIDER_SITE_OTHER): Payer: Self-pay

## 2013-11-26 VITALS — BP 152/84 | HR 66 | Resp 18 | Wt 195.0 lb

## 2013-11-26 DIAGNOSIS — F341 Dysthymic disorder: Secondary | ICD-10-CM

## 2013-11-26 DIAGNOSIS — E785 Hyperlipidemia, unspecified: Secondary | ICD-10-CM

## 2013-11-26 DIAGNOSIS — F329 Major depressive disorder, single episode, unspecified: Secondary | ICD-10-CM

## 2013-11-26 DIAGNOSIS — F419 Anxiety disorder, unspecified: Secondary | ICD-10-CM

## 2013-11-26 DIAGNOSIS — E119 Type 2 diabetes mellitus without complications: Secondary | ICD-10-CM

## 2013-11-26 DIAGNOSIS — E1065 Type 1 diabetes mellitus with hyperglycemia: Secondary | ICD-10-CM

## 2013-11-26 DIAGNOSIS — Z23 Encounter for immunization: Secondary | ICD-10-CM

## 2013-11-26 DIAGNOSIS — I10 Essential (primary) hypertension: Secondary | ICD-10-CM

## 2013-11-26 DIAGNOSIS — E109 Type 1 diabetes mellitus without complications: Secondary | ICD-10-CM

## 2013-11-26 DIAGNOSIS — G4700 Insomnia, unspecified: Secondary | ICD-10-CM

## 2013-11-26 MED ORDER — ALPRAZOLAM 0.5 MG PO TABS: ORAL_TABLET | ORAL | Status: DC

## 2013-11-26 MED ORDER — TEMAZEPAM 30 MG PO CAPS: ORAL_CAPSULE | ORAL | Status: DC

## 2013-11-26 MED ORDER — TRIAMTERENE-HCTZ 75-50 MG PO TABS: 1.0000 | ORAL_TABLET | Freq: Every day | ORAL | Status: DC

## 2013-11-26 NOTE — Patient Instructions (Addendum)
F/u 08/12 or after, call if you need me before  Miocroalb and pneumonia vaccine today  You will get info to attend individual diabetic ed class and we will help as much as able also   We will help with getting test supplies, you need to test once daily  New extra medication for blood pressure benazepril 57m one daily, which also protects your kidneys  Fasting lipid, cmp and EGFR and HBa1c August 6 or after

## 2013-11-29 MED ORDER — BENAZEPRIL HCL 20 MG PO TABS: 20.0000 mg | ORAL_TABLET | Freq: Every day | ORAL | Status: DC

## 2013-11-30 LAB — MICROALBUMIN / CREATININE URINE RATIO
CREATININE, URINE: 255.6 mg/dL
MICROALB/CREAT RATIO: 4.6 mg/g (ref 0.0–30.0)
Microalb, Ur: 1.18 mg/dL (ref 0.00–1.89)

## 2013-12-02 ENCOUNTER — Telehealth: Payer: Self-pay

## 2013-12-02 NOTE — Telephone Encounter (Signed)
Patient aware that he should be taking maxzide and benazepril.

## 2014-01-29 ENCOUNTER — Ambulatory Visit: Payer: BC Managed Care – PPO | Admitting: Family Medicine

## 2014-01-31 ENCOUNTER — Encounter (INDEPENDENT_AMBULATORY_CARE_PROVIDER_SITE_OTHER): Payer: Self-pay

## 2014-01-31 ENCOUNTER — Ambulatory Visit (INDEPENDENT_AMBULATORY_CARE_PROVIDER_SITE_OTHER): Payer: BC Managed Care – PPO | Admitting: Family Medicine

## 2014-01-31 VITALS — BP 152/90 | HR 92 | Resp 14 | Ht 68.0 in | Wt 202.1 lb

## 2014-01-31 DIAGNOSIS — Z125 Encounter for screening for malignant neoplasm of prostate: Secondary | ICD-10-CM

## 2014-01-31 DIAGNOSIS — Z0000 Encounter for general adult medical examination without abnormal findings: Secondary | ICD-10-CM

## 2014-01-31 DIAGNOSIS — Z91199 Patient's noncompliance with other medical treatment and regimen due to unspecified reason: Secondary | ICD-10-CM

## 2014-01-31 DIAGNOSIS — E785 Hyperlipidemia, unspecified: Secondary | ICD-10-CM

## 2014-01-31 DIAGNOSIS — Z1211 Encounter for screening for malignant neoplasm of colon: Secondary | ICD-10-CM

## 2014-01-31 DIAGNOSIS — I10 Essential (primary) hypertension: Secondary | ICD-10-CM

## 2014-01-31 DIAGNOSIS — E1165 Type 2 diabetes mellitus with hyperglycemia: Secondary | ICD-10-CM

## 2014-01-31 DIAGNOSIS — F3289 Other specified depressive episodes: Secondary | ICD-10-CM

## 2014-01-31 DIAGNOSIS — Z9119 Patient's noncompliance with other medical treatment and regimen: Secondary | ICD-10-CM

## 2014-01-31 DIAGNOSIS — F329 Major depressive disorder, single episode, unspecified: Secondary | ICD-10-CM

## 2014-01-31 LAB — POC HEMOCCULT BLD/STL (OFFICE/1-CARD/DIAGNOSTIC): Fecal Occult Blood, POC: NEGATIVE

## 2014-01-31 MED ORDER — BENAZEPRIL HCL 10 MG PO TABS: 10.0000 mg | ORAL_TABLET | Freq: Every day | ORAL | Status: DC

## 2014-01-31 MED ORDER — LOVASTATIN 20 MG PO TABS: 20.0000 mg | ORAL_TABLET | Freq: Every day | ORAL | Status: DC

## 2014-01-31 MED ORDER — TEMAZEPAM 30 MG PO CAPS: ORAL_CAPSULE | ORAL | Status: DC

## 2014-01-31 MED ORDER — FLUOXETINE HCL 40 MG PO CAPS: 40.0000 mg | ORAL_CAPSULE | Freq: Every day | ORAL | Status: DC

## 2014-01-31 MED ORDER — ALPRAZOLAM 0.5 MG PO TABS: ORAL_TABLET | ORAL | Status: DC

## 2014-01-31 MED ORDER — TRIAMTERENE-HCTZ 75-50 MG PO TABS: 1.0000 | ORAL_TABLET | Freq: Every day | ORAL | Status: DC

## 2014-01-31 MED ORDER — ONETOUCH DELICA LANCETS 33G MISC: Status: DC

## 2014-01-31 MED ORDER — GLUCOSE BLOOD VI STRP: ORAL_STRIP | Status: DC

## 2014-01-31 MED ORDER — GLIPIZIDE 5 MG PO TABS: 5.0000 mg | ORAL_TABLET | Freq: Two times a day (BID) | ORAL | Status: DC

## 2014-01-31 NOTE — Patient Instructions (Addendum)
F/u early October, call if you need me before  You need to fill these prescriptions where you can pay $4 , we will print the scripts and give them to you They are for your blood pressure, blood sugar , and  Your cholesterol and depression. You DO nEED ALL of these  I am uncertain of the cost of the xanax and the restoril  Fasting lipid, cmp and eGFR, HBA1C and PSa early October , before f/u   It is important that you exercise regularly at least 30 minutes 5 times a week. If you develop chest pain, have severe difficulty breathing, or feel very tired, stop exercising immediately and seek medical attention

## 2014-01-31 NOTE — Assessment & Plan Note (Signed)
Annual exam as documented. Counseling done  re healthy lifestyle involving commitment to 150 minutes exercise per week, heart healthy diet, and attaining healthy weight.The importance of adequate sleep also discussed. Regular seat belt use and safe storage  of firearms if patient has them, is also discussed. Changes in health habits are decided on by the patient with goals and time frames  set for achieving them. Immunization and cancer screening needs are specifically addressed at this visit.  

## 2014-02-02 ENCOUNTER — Encounter: Payer: Self-pay | Admitting: Family Medicine

## 2014-02-02 DIAGNOSIS — F329 Major depressive disorder, single episode, unspecified: Secondary | ICD-10-CM | POA: Insufficient documentation

## 2014-02-02 DIAGNOSIS — Z91199 Patient's noncompliance with other medical treatment and regimen due to unspecified reason: Secondary | ICD-10-CM | POA: Insufficient documentation

## 2014-02-02 DIAGNOSIS — F419 Anxiety disorder, unspecified: Secondary | ICD-10-CM

## 2014-02-02 DIAGNOSIS — F32A Depression, unspecified: Secondary | ICD-10-CM | POA: Insufficient documentation

## 2014-02-02 DIAGNOSIS — Z9119 Patient's noncompliance with other medical treatment and regimen: Secondary | ICD-10-CM | POA: Insufficient documentation

## 2014-02-02 NOTE — Progress Notes (Signed)
   Subjective:    Patient ID: John [name], male    DOB: 1961/01/18, 53 y.o.   MRN: [id number]  HPI Pt brought in as a new diabetic, to review his meter and log book having been diagnosed for the first time, in May, 2015, with diabetes and started on meds He has been doing fairly well, states he feels better on meds but out of strips currently and will seek more affordable supplies. He denies polyuria, polydipsia or hypoglycemic episodes   Review of Systems See HPI Denies recent fever or chills. Denies sinus pressure, nasal congestion, ear pain or sore throat. Denies chest congestion, productive cough or wheezing. Denies chest pains, palpitations and leg swelling Denies abdominal pain, nausea, vomiting,diarrhea or constipation.   Denies dysuria, frequency, hesitancy or incontinence. Denies joint pain, swelling and limitation in mobility. Denies headaches, seizures, numbness, or tingling. Denies depression, uncontrolled  anxiety or insomnia. Denies skin break down or rash.        Objective:   Physical Exam BP 152/84  Pulse 66  Resp 18  Wt 195 lb (88.451 kg)  SpO2 96% Patient alert and oriented and in no cardiopulmonary distress.  HEENT: No facial asymmetry, EOMI,   oropharynx pink and moist.  Neck supple no JVD, no mass.  Chest: Clear to auscultation bilaterally.  CVS: S1, S2 no murmurs, no S3.Regular rate.  ABD: Soft non tender.   Ext: No edema  MS: Adequate ROM spine, shoulders, hips and knees.  Skin: Intact, no ulcerations or rash noted.  Psych: Good eye contact, normal affect. Memory intact not anxious or depressed appearing.  CNS: CN 2-12 intact, power,  normal throughout.no focal deficits noted.        Assessment & Plan:  Type 2 diabetes mellitus Improved blood sugars on medication , but pt needs to get supplies and renew medication, Also referred to diabetic ed Importance orf daily execise and carb countinng also discussed , both by myself and  nursing  HYPERTENSION Uncontrolled, add benazepril DASH diet and commitment to daily physical activity for a minimum of 30 minutes discussed and encouraged, as a part of hypertension management. The importance of attaining a healthy weight is also discussed.   Insomnia Sleep hygiene reviewed and written information offered also. Prescription sent for  medication needed.   Anxiety and depression Continue bedtime xanax, for help with sleep Sleep hygiene reviewed and written information offered also. Prescription sent for  medication needed.   HYPERLIPIDEMIA Uncontrolled Hyperlipidemia:Low fat diet discussed and encouraged.  Start statin therapy

## 2014-02-02 NOTE — Assessment & Plan Note (Signed)
non compliant with most of his chronic medications, this has occurred in the past , again I am redirecting pt to affordable source for these medciations

## 2014-02-02 NOTE — Assessment & Plan Note (Signed)
Uncontrolled, add benazepril DASH diet and commitment to daily physical activity for a minimum of 30 minutes discussed and encouraged, as a part of hypertension management. The importance of attaining a healthy weight is also discussed.

## 2014-02-02 NOTE — Assessment & Plan Note (Signed)
Sleep hygiene reviewed and written information offered also. Prescription sent for  medication needed.  

## 2014-02-02 NOTE — Assessment & Plan Note (Signed)
Improved blood sugars on medication , but pt needs to get supplies and renew medication, Also referred to diabetic ed Importance orf daily execise and carb countinng also discussed , both by myself and nursing

## 2014-02-02 NOTE — Progress Notes (Signed)
   Subjective:    Patient ID: John [name], male    DOB: [birth year], 53 y.o.   MRN: [id number]  HPI Patient is in for annual physical exam. He states he feels well, but unfortunately he has not been taking regular medications prescribed for hypertension, diabetes or hyperlipidemia, states he has not got the money to buy his medication. Time is spent by myself and nursing staff diecting him to affordable $4 meds which he is on , at a local pharmacy   Review of Systems See HPI     Objective:   Physical Exam  BP 152/90  Pulse 92  Resp 14  Ht 5\' 8"  (1.727 m)  Wt 202 lb 1.9 oz (91.681 kg)  BMI 30.74 kg/m2  SpO2 96%  Pleasant obese male, alert and oriented x 3, in no cardio-pulmonary distress. Afebrile. HEENT No facial trauma or asymetry. Sinuses non tender. EOMI, PERTL,  External ears normal, tympanic membranes clear. Oropharynx moist, no exudate,fairly  good dentition. Neck: supple, no adenopathy,JVD or thyromegaly.No bruits.  Chest: Clear to ascultation bilaterally.No crackles or wheezes. Non tender to palpation  Breast: No asymetry,no masses. No nipple discharge or inversion. No axillary or supraclavicular adenopathy  Cardiovascular system; Heart sounds normal,  S1 and  S2 ,no S3.  No murmur, or thrill. Apical beat not displaced Peripheral pulses normal.  Abdomen: Soft, non tender, no organomegaly or masses. No bruits. Bowel sounds normal. No guarding, tenderness or rebound.  Rectal:  Normal sphincter tone. No hemorrhoids or  masses. guaiac negative stool. Prostate smooth and firm    Musculoskeletal exam: Full ROM of spine, hips , shoulders and knees. No deformity ,swelling or crepitus noted. No muscle wasting or atrophy.   Neurologic: Cranial nerves 2 to 12 intact. Power, tone ,sensation and reflexes normal throughout. No disturbance in gait. No tremor.  Skin: Intact, no ulceration, erythema , scaling or rash noted. Pigmentation normal  throughout  Psych; Normal mood and affect. Judgement and concentration normal         Assessment & Plan:  Routine general medical examination at a health care facility Annual exam as documented. Counseling done  re healthy lifestyle involving commitment to 150 minutes exercise per week, heart healthy diet, and attaining healthy weight.The importance of adequate sleep also discussed. Regular seat belt use and safe storage  of firearms if patient has them, is also discussed. Changes in health habits are decided on by the patient with goals and time frames  set for achieving them. Immunization and cancer screening needs are specifically addressed at this visit.   Medical non-compliance non compliant with most of his chronic medications, this has occurred in the past , again I am redirecting pt to affordable source for these medciations

## 2014-02-02 NOTE — Assessment & Plan Note (Signed)
Uncontrolled Hyperlipidemia:Low fat diet discussed and encouraged.  Start statin therapy

## 2014-02-02 NOTE — Assessment & Plan Note (Signed)
Continue bedtime xanax, for help with sleep Sleep hygiene reviewed and written information offered also. Prescription sent for  medication needed.

## 2014-02-20 ENCOUNTER — Emergency Department (HOSPITAL_COMMUNITY): Payer: BC Managed Care – PPO

## 2014-02-20 ENCOUNTER — Emergency Department (HOSPITAL_COMMUNITY)
Admission: EM | Admit: 2014-02-20 | Discharge: 2014-02-20 | Disposition: A | Discharge status: Home / Self Care | Payer: BC Managed Care – PPO | Attending: Emergency Medicine | Admitting: Emergency Medicine

## 2014-02-20 ENCOUNTER — Encounter (HOSPITAL_COMMUNITY): Payer: Self-pay | Admitting: Emergency Medicine

## 2014-02-20 ENCOUNTER — Telehealth: Payer: Self-pay | Admitting: Family Medicine

## 2014-02-20 DIAGNOSIS — E119 Type 2 diabetes mellitus without complications: Secondary | ICD-10-CM | POA: Insufficient documentation

## 2014-02-20 DIAGNOSIS — R52 Pain, unspecified: Secondary | ICD-10-CM | POA: Diagnosis not present

## 2014-02-20 DIAGNOSIS — I10 Essential (primary) hypertension: Secondary | ICD-10-CM | POA: Diagnosis not present

## 2014-02-20 DIAGNOSIS — M7730 Calcaneal spur, unspecified foot: Secondary | ICD-10-CM | POA: Diagnosis not present

## 2014-02-20 DIAGNOSIS — Z7982 Long term (current) use of aspirin: Secondary | ICD-10-CM | POA: Insufficient documentation

## 2014-02-20 DIAGNOSIS — F411 Generalized anxiety disorder: Secondary | ICD-10-CM | POA: Insufficient documentation

## 2014-02-20 DIAGNOSIS — Z87891 Personal history of nicotine dependence: Secondary | ICD-10-CM | POA: Diagnosis not present

## 2014-02-20 DIAGNOSIS — Z79899 Other long term (current) drug therapy: Secondary | ICD-10-CM | POA: Diagnosis not present

## 2014-02-20 DIAGNOSIS — F329 Major depressive disorder, single episode, unspecified: Secondary | ICD-10-CM | POA: Diagnosis not present

## 2014-02-20 DIAGNOSIS — E785 Hyperlipidemia, unspecified: Secondary | ICD-10-CM | POA: Diagnosis not present

## 2014-02-20 DIAGNOSIS — M7732 Calcaneal spur, left foot: Secondary | ICD-10-CM

## 2014-02-20 DIAGNOSIS — F3289 Other specified depressive episodes: Secondary | ICD-10-CM | POA: Insufficient documentation

## 2014-02-20 DIAGNOSIS — M25569 Pain in unspecified knee: Secondary | ICD-10-CM | POA: Diagnosis not present

## 2014-02-20 DIAGNOSIS — M25562 Pain in left knee: Secondary | ICD-10-CM

## 2014-02-20 HISTORY — DX: Type 2 diabetes mellitus without complications: E11.9

## 2014-02-20 MED ORDER — IBUPROFEN 800 MG PO TABS
800.0000 mg | ORAL_TABLET | Freq: Once | ORAL | Status: AC
  Administered 2014-02-20: 800 mg via ORAL
  Filled 2014-02-20: qty 1

## 2014-02-20 MED ORDER — HYDROCODONE-ACETAMINOPHEN 5-325 MG PO TABS: ORAL_TABLET | ORAL | Status: DC

## 2014-02-20 MED ORDER — NAPROXEN 500 MG PO TABS: 500.0000 mg | ORAL_TABLET | Freq: Two times a day (BID) | ORAL | Status: DC

## 2014-02-20 NOTE — ED Provider Notes (Signed)
Medical screening examination/treatment/procedure(s) were performed by non-physician practitioner and as supervising physician I was immediately available for consultation/collaboration.   EKG Interpretation None        Blessin Kanno L Alyza Artiaga, MD 02/20/14 1533 

## 2014-02-20 NOTE — ED Notes (Signed)
PT c/o left ankle pain worsening x5 days. PT denies any injury. PT ambulatory during triage.

## 2014-02-20 NOTE — ED Notes (Signed)
PA at bedside.

## 2014-02-20 NOTE — Discharge Instructions (Signed)
Knee Pain Knee pain can be a result of an injury or other medical conditions. Treatment will depend on the cause of your pain. HOME CARE  Only take medicine as told by your doctor.  Keep a healthy weight. Being overweight can make the knee hurt more.  Stretch before exercising or playing sports.  If there is constant knee pain, change the way you exercise. Ask your doctor for advice.  Make sure shoes fit well. Choose the right shoe for the sport or activity.  Protect your knees. Wear kneepads if needed.  Rest when you are tired. GET HELP RIGHT AWAY IF:   Your knee pain does not stop.  Your knee pain does not get better.  Your knee joint feels hot to the touch.  You have a fever. MAKE SURE YOU:   Understand these instructions.  Will watch this condition.  Will get help right away if you are not doing well or get worse. Document Released: 09/02/2008 Document Revised: 08/29/2011 Document Reviewed: 09/02/2008 [facility] Patient Information 2015 [place], [place]. This information is not intended to replace advice given to you by your health care provider. Make sure you discuss any questions you have with your health care provider.  Patellofemoral Syndrome If you have had pain in the front of your knee for a long time, chances are good that you have patellofemoral syndrome. The word patella refers to the kneecap. Femoral (or femur) refers to the thigh bone. That is the bone the kneecap sits on. The kneecap is shaped like a triangle. Its job is to protect the knee and to improve the efficiency of your thigh muscles (quadriceps). The underside of the kneecap is made of smooth tissue (cartilage). This lets the kneecap slide up and down as the knee moves. Sometimes this cartilage becomes soft. Your healthcare provider may say the cartilage breaks down. That is patellofemoral syndrome. It can affect one knee, or both. The condition is sometimes called patellofemoral pain syndrome. That is  because the condition is painful. The pain usually gets worse with activity. Sitting for a long time with the knee bent also makes the pain worse. It usually gets better with rest and proper treatment. CAUSES  No one is sure why some people develop this problem and others do not. Runners often get it. One name for the condition is "runner's knee." However, some people run for years and never have knee pain. Certain things seem to make patellofemoral syndrome more likely. They include:  Moving out of alignment. The kneecap is supposed to move in a straight line when the thigh muscle pulls on it. Sometimes the kneecap moves in poor alignment. That can make the knee swell and hurt. Some experts believe it also wears down the cartilage.  Injury to the kneecap.  Strain on the knee. This may occur during sports activity. Soccer, running, skiing and cycling can put excess stress on the knee.  Being flat-footed or knock-kneed. SYMPTOMS   Knee pain.  Pain under the kneecap. This is usually a dull, aching pain.  Pain in the knee when doing certain things: squatting, kneeling, going up or down stairs.  Pain in the knee when you stand up after sitting down for awhile.  Tightness in the knee.  Loss of muscle strength in the thigh.  Swelling of the knee. DIAGNOSIS  Healthcare providers often send people with knee pain to an orthopedic caregiver. This person has special training to treat problems with bones and joints. To decide what is causing  your knee pain, your caregiver will probably:  Do a physical exam. This will probably include:  Asking about symptoms you have noticed.  Asking about your activities and any injuries.  Feeling your knee. Moving it. This will help test the knee's strength. It will also check alignment (whether the knee and leg are aligned normally).  Order some tests, such as:  Imaging tests. They create pictures of the inside of the knee. Tests may  include:  X-rays.  Computed tomography (CT) scan. This uses X-rays and a computer to show more detail.  Magnetic resonance imaging (MRI). This test uses magnets, radio waves and a computer to make pictures. TREATMENT   Medication is almost always used first. It can relieve pain. It also can reduce swelling. Non-steroidal anti-inflammatory medicines (called NSAIDs) are usually suggested. Sometimes a stronger form is needed. A stronger form would require a prescription.  Other treatment may be needed after the swelling goes down. Possibilities include:  Exercise. Certain exercises can make the muscles around the knee stronger which decreases the pressure on the knee cap. This includes the thigh muscle. Certain exercises also may be suggested to increase your flexibility.  A knee brace. This gives the knee extra support and helps align the movement of the knee cap.  Orthotics. These are special shoe inserts. They can help keep your leg and knee aligned.  Surgery is sometimes needed. This is rare. Options include:  Arthroscopy. The surgeon uses a special tool to remove any damaged pieces of the kneecap. Only a few small incisions (cuts) are needed.  Realignment. This is open surgery. The goals are to reduce pressure and fix the way the kneecap moves. HOME CARE INSTRUCTIONS   Take any medication prescribed by your healthcare provider. Follow the directions carefully.  If your knee is swollen:  Put ice or cold packs on it. Do this for 20 to 30 minutes, 3 to 4 times a day.  Keep the knee raised. Make sure it is supported. Put a pillow under it.  Rest your knee. For example, take the elevator instead of the stairs for awhile. Or, take a break from sports activity that strain your knee. Try walking or swimming instead.  Whenever you are active:  Use an elastic bandage on your knee. This gives it support.  After any activity, put ice or cold packs on your knees. Do this for about 10 to  20 minutes.  Make sure you wear shoes that give good support. Make sure they are not worn down. The heels should not slant in or out. SEEK MEDICAL CARE IF:   Knee pain gets worse. Or it does not go away, even after taking pain medicine.  Swelling does not go down.  Your thigh muscle becomes weak.  You have an oral temperature above 102 F (38.9 C). SEEK IMMEDIATE MEDICAL CARE IF:  You have an oral temperature above 102 F (38.9 C), not controlled by medicine. Document Released: 05/25/2009 Document Revised: 08/29/2011 Document Reviewed: 08/26/2013 [facility] Patient Information 2015 [place], [place]. This information is not intended to replace advice given to you by your health care provider. Make sure you discuss any questions you have with your health care provider.

## 2014-02-20 NOTE — ED Provider Notes (Signed)
CSN: [id number]     Arrival date & time 02/20/14  0847 History   First MD Initiated Contact with Patient 02/20/14 0848     Chief Complaint  Patient presents with  . Knee Pain   John [name] is a 53 y.o. male  who presents to the Emergency Department complaining of left knee pain for five days.  He states pain began in his left ankle and has now "spread" to his knee. Denies known injury or ankle pain at this time.   (Consider location/radiation/quality/duration/timing/severity/associated sxs/prior Treatment) Patient is a 53 y.o. male presenting with knee pain.  Knee Pain Location:  Knee Time since incident:  5 days Injury: no   Knee location:  L knee Pain details:    Quality:  Aching and throbbing   Radiates to:  Does not radiate   Severity:  Moderate   Onset quality:  Gradual   Timing:  Constant   Progression:  Unchanged Chronicity:  New Dislocation: no   Foreign body present:  No foreign bodies Prior injury to area:  No Relieved by:  Nothing Worsened by:  Flexion and bearing weight Ineffective treatments:  NSAIDs Associated symptoms: decreased ROM, stiffness and swelling   Associated symptoms: no back pain, no fever, no muscle weakness, no neck pain, no numbness and no tingling    John [name] is a 53 y.o. male   Past Medical History  Diagnosis Date  . Depression 2007  . Hyperlipidemia   . Hypertension   . Hypogonadism male   . Anxiety   . Diabetes mellitus without complication    Past Surgical History  Procedure Laterality Date  . Hernia repair      ventral hernia repair   . Colonoscopy  01/20/2011    Procedure: COLONOSCOPY;  Surgeon: Arlyce Harman, MD;  Location: [facility];  Service: Endoscopy;  Laterality: N/A;   Family History  Problem Relation Age of Onset  . Heart failure Mother     living   . Hypertension Mother   . Colon cancer Neg Hx    History  Substance Use Topics  . Smoking status: Former Smoker -- 0.25 packs/day    Types: Cigarettes   . Smokeless tobacco: Not on file  . Alcohol Use: 0.6 oz/week    1 Cans of beer per week     Comment: occasional beer socially.    Review of Systems  Constitutional: Negative for fever and chills.  Genitourinary: Negative for dysuria and difficulty urinating.  Musculoskeletal: Positive for arthralgias, joint swelling and stiffness. Negative for back pain and neck pain.  Skin: Negative for color change and wound.  Neurological: Negative for weakness and numbness.  All other systems reviewed and are negative.     Allergies  Review of patient's allergies indicates no known allergies.  Home Medications   Prior to Admission medications   Medication Sig Start Date End Date Taking? Authorizing Provider  ALPRAZolam Prudy Feeler) 0.5 MG tablet TAKE 1 TABLET AT BEDTIME AS NEEDED FOR ANXIETY 01/31/14   Kerri Perches, MD  benazepril (LOTENSIN) 10 MG tablet Take 1 tablet (10 mg total) by mouth daily. 01/31/14   Kerri Perches, MD  FLUoxetine (PROZAC) 40 MG capsule Take 1 capsule (40 mg total) by mouth daily. 01/31/14   Kerri Perches, MD  glipiZIDE (GLUCOTROL) 5 MG tablet Take 1 tablet (5 mg total) by mouth 2 (two) times daily before a meal. 01/31/14   Kerri Perches, MD  glucose blood test strip  Once daily testing (one touch verio) Dx 250.00 01/31/14   Kerri Perches, MD  lovastatin (MEVACOR) 20 MG tablet Take 1 tablet (20 mg total) by mouth at bedtime. 01/31/14   Kerri Perches, MD  [facility] DELICA LANCETS 33G MISC Once daily testing dx 250.00 01/31/14   Kerri Perches, MD  RA ASPIRIN EC ADULT LOW ST 81 MG EC tablet take 1 tablet by mouth once daily 09/18/13   Kerri Perches, MD  temazepam (RESTORIL) 30 MG capsule TAKE 1 CAPSULE BY MOUTH AT BEDTIME AS NEEDED FOR SLEEP 01/31/14   Kerri Perches, MD  triamterene-hydrochlorothiazide (MAXZIDE) 75-50 MG per tablet Take 1 tablet by mouth daily. 01/31/14 01/31/15  Kerri Perches, MD   BP 174/86  Pulse 86  Temp(Src) 98.7 F  (37.1 C) (Oral)  Resp 16  Ht  (1.727 m)  Wt 185 lb (83.915 kg)  BMI 28.14 kg/m2  SpO2 100% Physical Exam  Nursing note and vitals reviewed. Constitutional: He is oriented to person, place, and time. He appears well-developed and well-nourished. No distress.  HENT:  Head: Normocephalic and atraumatic.  Cardiovascular: Normal rate, regular rhythm, normal heart sounds and intact distal pulses.   No murmur heard. Pulmonary/Chest: Effort normal and breath sounds normal. No respiratory distress.  Musculoskeletal: Normal range of motion. He exhibits edema and tenderness.  ttp of the medial left knee. Mild edema and patellar crepitus.   No erythema, effusion, or step-off deformity.  DP pulse brisk, distal sensation intact. Compartments fo the left LE are soft.    Neurological: He is alert and oriented to person, place, and time. He exhibits normal muscle tone. Coordination normal.  Skin: Skin is warm and dry. No erythema.    ED Course  Procedures (including critical care time) Labs Review Labs Reviewed - No data to display  Imaging Review Dg Ankle Complete Left  02/20/2014   CLINICAL DATA:  Left ankle pain and swelling for 5 days  EXAM: LEFT ANKLE COMPLETE - 3+ VIEW  COMPARISON:  None.  FINDINGS: The ankle joint mortise is preserved. The talar dome is intact. There is no acute or healing fracture. There are minimal degenerative changes of the ankle. There is small plantar calcaneal spur. The soft tissues are unremarkable.  IMPRESSION: There is no acute bony abnormality of the left ankle. Mild degenerative changes are present.   Electronically Signed   By: David  [place]   On: 02/20/2014 09:50   Dg Knee Complete 4 Views Left  02/20/2014   CLINICAL DATA:  Atraumatic left knee pain and swelling  EXAM: LEFT KNEE - COMPLETE 4+ VIEW  COMPARISON:  None.  FINDINGS: The bones of the left knee are adequately mineralized. There is no acute or healing fracture. There is no dislocation. The soft tissues  are unremarkable.  IMPRESSION: There is no acute bony abnormality of the left knee.   Electronically Signed   By: David  [place]   On: 02/20/2014 09:49     EKG Interpretation None      MDM   Final diagnoses:  None   Knee immob applied, pain improved and remains NV intact.    Pt is well appearing and ambulatory.  Discussed XR findings with patient.  He agrees to symptomatic treatment with pain medications, anti-inflammatory and orthopedic f/u if not improving.   No concerning sx's for septic joint.      Maela Takeda L. Athleen Feltner, PA-C 02/20/14 1037

## 2014-02-22 NOTE — Telephone Encounter (Signed)
Pt was seen in the Ed for the problem

## 2014-03-19 ENCOUNTER — Other Ambulatory Visit: Payer: Self-pay

## 2014-03-19 MED ORDER — TEMAZEPAM 30 MG PO CAPS: ORAL_CAPSULE | ORAL | Status: DC

## 2014-04-23 ENCOUNTER — Other Ambulatory Visit: Payer: Self-pay | Admitting: Family Medicine

## 2014-04-28 ENCOUNTER — Other Ambulatory Visit: Payer: Self-pay

## 2014-04-28 ENCOUNTER — Other Ambulatory Visit: Payer: Self-pay | Admitting: Family Medicine

## 2014-04-28 MED ORDER — ALPRAZOLAM 0.5 MG PO TABS: ORAL_TABLET | ORAL | Status: DC

## 2014-05-07 LAB — COMPLETE METABOLIC PANEL WITH GFR
ALT: 33 U/L (ref 0–53)
AST: 30 U/L (ref 0–37)
Albumin: 4.1 g/dL (ref 3.5–5.2)
Alkaline Phosphatase: 65 U/L (ref 39–117)
BUN: 20 mg/dL (ref 6–23)
CALCIUM: 9.5 mg/dL (ref 8.4–10.5)
CHLORIDE: 102 meq/L (ref 96–112)
CO2: 27 mEq/L (ref 19–32)
CREATININE: 1.42 mg/dL — AB (ref 0.50–1.35)
GFR, EST AFRICAN AMERICAN: 64 mL/min
GFR, Est Non African American: 56 mL/min — ABNORMAL LOW
Glucose, Bld: 118 mg/dL — ABNORMAL HIGH (ref 70–99)
Potassium: 4 mEq/L (ref 3.5–5.3)
Sodium: 138 mEq/L (ref 135–145)
Total Bilirubin: 0.5 mg/dL (ref 0.2–1.2)
Total Protein: 7 g/dL (ref 6.0–8.3)

## 2014-05-07 LAB — LIPID PANEL
Cholesterol: 173 mg/dL (ref 0–200)
HDL: 37 mg/dL — AB (ref >39)
LDL CALC: 114 mg/dL — AB (ref 0–99)
Total CHOL/HDL Ratio: 4.7 Ratio
Triglycerides: 110 mg/dL (ref <150)
VLDL: 22 mg/dL (ref 0–40)

## 2014-05-07 LAB — HEMOGLOBIN A1C
HEMOGLOBIN A1C: 5.9 % — AB (ref <5.7)
Mean Plasma Glucose: 123 mg/dL — ABNORMAL HIGH (ref <117)

## 2014-05-08 ENCOUNTER — Encounter: Payer: Self-pay | Admitting: Family Medicine

## 2014-05-08 ENCOUNTER — Ambulatory Visit (INDEPENDENT_AMBULATORY_CARE_PROVIDER_SITE_OTHER): Payer: BC Managed Care – PPO

## 2014-05-08 ENCOUNTER — Ambulatory Visit (INDEPENDENT_AMBULATORY_CARE_PROVIDER_SITE_OTHER): Payer: BC Managed Care – PPO | Admitting: Family Medicine

## 2014-05-08 ENCOUNTER — Encounter (INDEPENDENT_AMBULATORY_CARE_PROVIDER_SITE_OTHER): Payer: Self-pay

## 2014-05-08 VITALS — BP 130/82 | HR 91 | Resp 16 | Ht 68.0 in | Wt 193.0 lb

## 2014-05-08 DIAGNOSIS — I10 Essential (primary) hypertension: Secondary | ICD-10-CM

## 2014-05-08 DIAGNOSIS — Z23 Encounter for immunization: Secondary | ICD-10-CM | POA: Insufficient documentation

## 2014-05-08 DIAGNOSIS — E119 Type 2 diabetes mellitus without complications: Secondary | ICD-10-CM

## 2014-05-08 DIAGNOSIS — F419 Anxiety disorder, unspecified: Secondary | ICD-10-CM

## 2014-05-08 DIAGNOSIS — E785 Hyperlipidemia, unspecified: Secondary | ICD-10-CM

## 2014-05-08 DIAGNOSIS — F418 Other specified anxiety disorders: Secondary | ICD-10-CM

## 2014-05-08 DIAGNOSIS — G5602 Carpal tunnel syndrome, left upper limb: Secondary | ICD-10-CM

## 2014-05-08 DIAGNOSIS — G4700 Insomnia, unspecified: Secondary | ICD-10-CM

## 2014-05-08 DIAGNOSIS — E669 Obesity, unspecified: Secondary | ICD-10-CM

## 2014-05-08 DIAGNOSIS — F329 Major depressive disorder, single episode, unspecified: Secondary | ICD-10-CM

## 2014-05-08 LAB — PSA: PSA: 1.19 ng/mL (ref <=4.00)

## 2014-05-08 MED ORDER — LOVASTATIN 40 MG PO TABS: 40.0000 mg | ORAL_TABLET | Freq: Every day | ORAL | Status: DC

## 2014-05-08 MED ORDER — GLIPIZIDE 5 MG PO TABS: 5.0000 mg | ORAL_TABLET | Freq: Every day | ORAL | Status: DC

## 2014-05-08 MED ORDER — FLUOXETINE HCL 20 MG PO TABS: 20.0000 mg | ORAL_TABLET | Freq: Every day | ORAL | Status: DC

## 2014-05-08 NOTE — Assessment & Plan Note (Addendum)
Improved , still not at goal, dose increase  Of lovastatin. Hyperlipidemia:Low fat diet discussed and encouraged.

## 2014-05-08 NOTE — Assessment & Plan Note (Signed)
Vaccine administered at visit.  

## 2014-05-08 NOTE — Progress Notes (Signed)
   Subjective:    Patient ID: John [name], male    DOB: June 09, 1961, 53 y.o.   MRN: [id number]  HPI The PT is here for follow up and re-evaluation of chronic medical conditions, medication management and review of any available recent lab and radiology data.  Preventive health is updated, specifically  Cancer screening and Immunization.    The PT denies any adverse reactions to current medications since the last visit.  Increased left hand pain and numbness awakens patient with tingling , denies weakness Denies polyuria, polydipsia, blurred vision , or hypoglycemic episodes.      Review of Systems See HPI Denies recent fever or chills. Denies sinus pressure, nasal congestion, ear pain or sore throat. Denies chest congestion, productive cough or wheezing. Denies chest pains, palpitations and leg swelling Denies abdominal pain, nausea, vomiting,diarrhea or constipation.   Denies dysuria, frequency, hesitancy or incontinence.  . Denies uncontrolled  depression, anxiety or insomnia. Denies skin break down or rash.        Objective:   Physical Exam BP 130/82 mmHg  Pulse 91  Resp 16  Ht 5\' 8"  (1.727 m)  Wt 193 lb (87.544 kg)  BMI 29.35 kg/m2  SpO2 100% Patient alert and oriented and in no cardiopulmonary distress.  HEENT: No facial asymmetry, EOMI,   oropharynx pink and moist.  Neck supple no JVD, no mass.  Chest: Clear to auscultation bilaterally.  CVS: S1, S2 no murmurs, no S3.Regular rate.  ABD: Soft non tender.   Ext: No edema  MS: Adequate ROM spine, shoulders, hips and knees.Left thenar wasting and positive tunnel's sign  Skin: Intact, no ulcerations or rash noted.  Psych: Good eye contact, normal affect. Memory intact not anxious or depressed appearing.  CNS: CN 2-12 intact, power,  normal throughout.no focal deficits noted.        Assessment & Plan:  Type 2 diabetes mellitus Improved, and over corrected, reduce dose to glipizide 5 mg one  daily Patient advised to reduce carb and sweets, commit to regular physical activity, take meds as prescribed, test blood as directed, and attempt to lose weight, to improve blood sugar control.   Hyperlipidemia LDL goal <100 Improved , still not at goal, dose increase  Of lovastatin. Hyperlipidemia:Low fat diet discussed and encouraged.    Need for prophylactic vaccination and inoculation against influenza Vaccine administered at visit.    Essential hypertension Controlled, no change in medication DASH diet and commitment to daily physical activity for a minimum of 30 minutes discussed and encouraged, as a part of hypertension management. The importance of attaining a healthy weight is also discussed.   Anxiety and depression Improved, needs to resume fluoxetine daily will start lower dose Reports full time employment which clearly is a positive Also briefly mentions he devastating effect of losing his only son several years ago  Insomnia Sleep hygiene reviewed and written information offered also. Prescription sent for  medication needed.   Obesity, Class I, BMI 30-34.9 Improved. Pt applauded on succesful weight loss through lifestyle change, and encouraged to continue same. Weight loss goal set for the next several months.   Carpal tunnel syndrome, left Already uses a  Splint, resumption of and SSRI will aid pain, no interest in surgical intervention or injection currently

## 2014-05-08 NOTE — Patient Instructions (Addendum)
F/u in 3.5 month, call if you need me before  Thankful you ar e working and feeling better  INCREASE lovastatin to 40mg daily (OJK to take TWO 20mg tabs every night till done)  DECREASE glipizide to 5 mg ONE DAILY  Flu vaccine today  RESUME fluoxetine 20mg one daily, may also help with the nerve pain in left hand   Fasting lipid, cmp and EGFr and HBA1c in 3.5 month  You are referred for eye exam 

## 2014-05-08 NOTE — Assessment & Plan Note (Addendum)
Improved, and over corrected, reduce dose to glipizide 5 mg one daily Patient advised to reduce carb and sweets, commit to regular physical activity, take meds as prescribed, test blood as directed, and attempt to lose weight, to improve blood sugar control.

## 2014-05-11 DIAGNOSIS — G5602 Carpal tunnel syndrome, left upper limb: Secondary | ICD-10-CM | POA: Insufficient documentation

## 2014-05-11 NOTE — Assessment & Plan Note (Signed)
Improved. Pt applauded on succesful weight loss through lifestyle change, and encouraged to continue same. Weight loss goal set for the next several months.  

## 2014-05-11 NOTE — Assessment & Plan Note (Signed)
Already uses a  Splint, resumption of and SSRI will aid pain, no interest in surgical intervention or injection currently

## 2014-05-11 NOTE — Assessment & Plan Note (Signed)
Controlled, no change in medication DASH diet and commitment to daily physical activity for a minimum of 30 minutes discussed and encouraged, as a part of hypertension management. The importance of attaining a healthy weight is also discussed.  

## 2014-05-11 NOTE — Assessment & Plan Note (Signed)
Improved, needs to resume fluoxetine daily will start lower dose Reports full time employment which clearly is a positive Also briefly mentions he devastating effect of losing his only son several years ago

## 2014-05-11 NOTE — Assessment & Plan Note (Signed)
Sleep hygiene reviewed and written information offered also. Prescription sent for  medication needed.  

## 2014-05-29 ENCOUNTER — Other Ambulatory Visit: Payer: Self-pay | Admitting: Family Medicine

## 2014-07-24 ENCOUNTER — Ambulatory Visit (INDEPENDENT_AMBULATORY_CARE_PROVIDER_SITE_OTHER): Payer: BLUE CROSS/BLUE SHIELD | Admitting: Family Medicine

## 2014-07-24 ENCOUNTER — Telehealth: Payer: Self-pay | Admitting: *Deleted

## 2014-07-24 ENCOUNTER — Encounter: Payer: Self-pay | Admitting: Family Medicine

## 2014-07-24 VITALS — BP 122/78 | HR 81 | Resp 16 | Ht 68.0 in | Wt 196.0 lb

## 2014-07-24 DIAGNOSIS — F418 Other specified anxiety disorders: Secondary | ICD-10-CM

## 2014-07-24 DIAGNOSIS — E119 Type 2 diabetes mellitus without complications: Secondary | ICD-10-CM

## 2014-07-24 DIAGNOSIS — E663 Overweight: Secondary | ICD-10-CM

## 2014-07-24 DIAGNOSIS — E785 Hyperlipidemia, unspecified: Secondary | ICD-10-CM

## 2014-07-24 DIAGNOSIS — G4700 Insomnia, unspecified: Secondary | ICD-10-CM

## 2014-07-24 DIAGNOSIS — I10 Essential (primary) hypertension: Secondary | ICD-10-CM

## 2014-07-24 MED ORDER — TEMAZEPAM 30 MG PO CAPS: ORAL_CAPSULE | ORAL | Status: DC

## 2014-07-24 MED ORDER — TRIAMTERENE-HCTZ 75-50 MG PO TABS: 1.0000 | ORAL_TABLET | Freq: Every day | ORAL | Status: DC

## 2014-07-24 MED ORDER — ALPRAZOLAM 0.5 MG PO TABS: ORAL_TABLET | ORAL | Status: DC

## 2014-07-24 NOTE — Progress Notes (Signed)
   Subjective:    Patient ID: John RoundGary K Minassian, male    DOB: [birth year], 54 y.o.   MRN: [id number]  HPI The PT is here for follow up and re-evaluation of chronic medical conditions, medication management and review of any available recent lab and radiology data.  Preventive health is updated, specifically  Cancer screening and Immunization.  Needs eye exam Pt specifically questioned about alcohol use , and he repeatedly states this is minimal The PT denies any adverse reactions to current medications since the last visit.  Had episdoe of nausea and breathing difficulty last night while on the job based on exposure to fumes, states he left work early but today at visit , he is asymptomatic I advised him to take up any health problems which are job associated with the company Doc    Review of Systems See HPI Denies recent fever or chills. Denies sinus pressure, nasal congestion, ear pain or sore throat. Denies chest congestion, productive cough or wheezing. Denies chest pains, palpitations and leg swelling Denies abdominal pain, nausea, vomiting,diarrhea or constipation.   Denies dysuria, frequency, hesitancy or incontinence. Denies joint pain, swelling and limitation in mobility. Denies headaches, seizures, numbness, or tingling. Denies depression, anxiety or insomnia. Denies skin break down or rash.        Objective:   Physical Exam BP 122/78 mmHg  Pulse 81  Resp 16  Ht 5\' 8"  (1.727 m)  Wt 196 lb (88.905 kg)  BMI 29.81 kg/m2  SpO2 97% Patient alert and oriented and in no cardiopulmonary distress.  HEENT: No facial asymmetry, EOMI,   oropharynx pink and moist.  Neck supple no JVD, no mass.  Chest: Clear to auscultation bilaterally.  CVS: S1, S2 no murmurs, no S3.Regular rate.  ABD: Soft non tender.   Ext: No edema  MS: Adequate ROM spine, shoulders, hips and knees.  Skin: Intact, no ulcerations or rash noted.  Psych: Good eye contact, normal affect. Memory intact  not anxious or depressed appearing.  CNS: CN 2-12 intact, power,  normal throughout.no focal deficits noted.       Assessment & Plan:  Hyperlipidemia LDL goal <100 Uncontrolled Hyperlipidemia:Low fat diet discussed and encouraged.  Updated lab needed at/ before next visit.    Essential hypertension Controlled, no change in medication DASH diet and commitment to daily physical activity for a minimum of 30 minutes discussed and encouraged, as a part of hypertension management. The importance of attaining a healthy weight is also discussed.    Depression with anxiety Controlled, no change in medication    Insomnia Sleep hygiene reviewed and written information offered also. Prescription sent for  medication needed.    Overweight (BMI 25.0-29.9) Unbchanged Patient re-educated about  the importance of commitment to a  minimum of 150 minutes of exercise per week. The importance of healthy food choices with portion control discussed. Encouraged to start a food diary, count calories and to consider  joining a support group. Sample diet sheets offered. Goals set by the patient for the next several months.

## 2014-07-24 NOTE — Telephone Encounter (Signed)
Error in opening chart

## 2014-07-24 NOTE — Patient Instructions (Addendum)
F/u June 17 or after , call if you need me before  You are doing well  I trust that you continue to improve  CBC, tSH, Fasting lipid,cmp and EGFR, hBA1C , microalb June 13 or after  It is important that you exercise regularly at least 30 minutes 5 times a week. If you develop chest pain, have severe difficulty breathing, or feel very tired, stop exercising immediately and seek medical attention

## 2014-07-26 NOTE — Assessment & Plan Note (Signed)
Controlled, no change in medication  

## 2014-07-26 NOTE — Assessment & Plan Note (Signed)
Uncontrolled. Hyperlipidemia:Low fat diet discussed and encouraged.  Updated lab needed at/ before next visit.  

## 2014-07-26 NOTE — Assessment & Plan Note (Signed)
Controlled, no change in medication DASH diet and commitment to daily physical activity for a minimum of 30 minutes discussed and encouraged, as a part of hypertension management. The importance of attaining a healthy weight is also discussed.  

## 2014-07-26 NOTE — Assessment & Plan Note (Signed)
Sleep hygiene reviewed and written information offered also. Prescription sent for  medication needed.  

## 2014-07-26 NOTE — Assessment & Plan Note (Signed)
Unbchanged Patient re-educated about  the importance of commitment to a  minimum of 150 minutes of exercise per week. The importance of healthy food choices with portion control discussed. Encouraged to start a food diary, count calories and to consider  joining a support group. Sample diet sheets offered. Goals set by the patient for the next several months.

## 2014-08-07 ENCOUNTER — Ambulatory Visit (INDEPENDENT_AMBULATORY_CARE_PROVIDER_SITE_OTHER): Payer: BLUE CROSS/BLUE SHIELD | Admitting: Family Medicine

## 2014-08-07 ENCOUNTER — Encounter: Payer: Self-pay | Admitting: Family Medicine

## 2014-08-07 VITALS — BP 134/80 | HR 74 | Resp 16 | Ht 68.0 in | Wt 198.0 lb

## 2014-08-07 DIAGNOSIS — E785 Hyperlipidemia, unspecified: Secondary | ICD-10-CM | POA: Diagnosis not present

## 2014-08-07 DIAGNOSIS — I10 Essential (primary) hypertension: Secondary | ICD-10-CM

## 2014-08-07 DIAGNOSIS — E663 Overweight: Secondary | ICD-10-CM

## 2014-08-07 DIAGNOSIS — G4700 Insomnia, unspecified: Secondary | ICD-10-CM

## 2014-08-07 DIAGNOSIS — F418 Other specified anxiety disorders: Secondary | ICD-10-CM | POA: Diagnosis not present

## 2014-08-07 MED ORDER — FLUOXETINE HCL 40 MG PO CAPS: 40.0000 mg | ORAL_CAPSULE | Freq: Every day | ORAL | Status: DC

## 2014-08-07 NOTE — Patient Instructions (Addendum)
F/u in 6 weeks, call if you need me before  Increase in fluoxetine dose to 40 mg daily, OK to take TWO daily  Please commit to regular exercise to help with depression  Work excuse for 2/16 to return 08/11/2014  You will be referred to psychiatry if the higher dose of medication does not helpp  I recommend marriage counselling

## 2014-08-07 NOTE — Progress Notes (Signed)
Subjective:    Patient ID: John [name], male    DOB: [birth year], 54 y.o.   MRN: [id number]  HPI Pt in with main c/o inability to function at work for past 2 days due to severe uncontrolled depression, Last worked 2/15, requests even short term leave to see if he can "get himself together"Denies suicidal or homicidal ideation, but has little interest in / drive to leave ever since son's death in 11-12-2003. Still resists therapy and psych help , but will call in if he changes his mind. Denies polyuria, polydipsia, blurred vision , or hypoglycemic episodes. No regular exercise, de motivated and fatigued all the time   Review of Systems See HPI Denies recent fever or chills. Denies sinus pressure, nasal congestion, ear pain or sore throat. Denies chest congestion, productive cough or wheezing. Denies chest pains, palpitations and leg swelling Denies abdominal pain, nausea, vomiting,diarrhea or constipation.   Denies dysuria, frequency, hesitancy or incontinence. Denies joint pain, swelling and limitation in mobility. Denies headaches, seizures, numbness, or tingling. Denies skin break down or rash.        Objective:   Physical Exam BP 134/80 mmHg  Pulse 74  Resp 16  Ht  (1.727 m)  Wt 198 lb (89.812 kg)  BMI 30.11 kg/m2  SpO2 98% Patient alert and oriented and in no cardiopulmonary distress.  HEENT: No facial asymmetry, EOMI,   oropharynx pink and moist.  Neck supple no JVD, no mass.  Chest: Clear to auscultation bilaterally.  CVS: S1, S2 no murmurs, no S3.Regular rate.  ABD: Soft non tender.   Ext: No edema  MS: Adequate ROM spine, shoulders, hips and knees.  Skin: Intact, no ulcerations or rash noted.  Psych: Good eye contact, flat affect, severely depressed appearing  CNS: CN 2-12 intact, power,  normal throughout.no focal deficits noted.        Assessment & Plan:  Depression with anxiety Not controlled, not actively suicidal or homicidal , but  ongoing deterioration in ability to function, work excuse for limited time, resists therapy and treatment at thsi time, though I believe that he needs this. If unable to return to work consistently, he agrees to see psychiatry Increase fluoxetine to 40 mg daily   Insomnia Sleep hygiene reviewed and written information offered also.Sub optimal control. Prescription sent for  medication needed.   Type 2 diabetes mellitus Patient educated about the importance of limiting  Carbohydrate intake , the need to commit to daily physical activity for a minimum of 30 minutes , and to commit weight loss. Controlled  Diabetic Labs Latest Ref Rng 09/17/2014 05/07/2014 11/29/2013 10/23/2013 10/22/2013  HbA1c <5.7 % 6.7(H) 5.9(H) - 7.6(H) -  Microalbumin <2.0 mg/dL 0.7 - 8.29 - -  Micro/Creat Ratio 0.0 - 30.0 mg/g 2.8 - 4.6 - -  Chol 0 - 200 mg/dL 562 130 - - 865(H)  HDL >=40 mg/dL 84(O) 96(E) - - 95(M)  Calc LDL 0 - 99 mg/dL 841(L) 244(W) - - 102(V)  Triglycerides <150 mg/dL 253 664 - - 403(K)  Creatinine 0.50 - 1.35 mg/dL 7.42(V) 9.56(L) - - 8.75(I)   BP/Weight 11/28/2014 10/31/2014 09/18/2014 08/07/2014 07/24/2014 05/08/2014 02/20/2014  Systolic BP 107 116 122 134 122 130 116  Diastolic BP 73 75 82 80 78 82 68  Wt. (Lbs) 200.4 200.4 196 198 196 193 185  BMI 30.48 30.48 29.81 30.11 29.81 29.35 28.14   Foot/eye exam completion dates 11/26/2013  Foot Form Completion Done  Essential hypertension Controlled, no change in medication DASH diet and commitment to daily physical activity for a minimum of 30 minutes discussed and encouraged, as a part of hypertension management. The importance of attaining a healthy weight is also discussed.  BP/Weight 11/28/2014 10/31/2014 09/18/2014 08/07/2014 07/24/2014 05/08/2014 02/20/2014  Systolic BP 107 116 122 134 122 130 116  Diastolic BP 73 75 82 80 78 82 68  Wt. (Lbs) 200.4 200.4 196 198 196 193 185  BMI 30.48 30.48 29.81 30.11 29.81 29.35 28.14         Hyperlipidemia LDL goal <100 Hyperlipidemia:Low fat diet discussed and encouraged.   Lipid Panel  Lab Results  Component Value Date   CHOL 189 09/17/2014   HDL 35* 09/17/2014   LDLCALC 131* 09/17/2014   TRIG 113 09/17/2014   CHOLHDL 5.4 09/17/2014   Uncontrolled, med adherence and lower fat diet needed     Overweight (BMI 25.0-29.9) Deteriorated. Patient re-educated about  the importance of commitment to a  minimum of 150 minutes of exercise per week.  The importance of healthy food choices with portion control discussed. Encouraged to start a food diary, count calories and to consider  joining a support group. Sample diet sheets offered. Goals set by the patient for the next several months.   Weight /BMI 11/28/2014 10/31/2014 09/18/2014  WEIGHT 200 lb 6.4 oz 200 lb 6.4 oz 196 lb  HEIGHT 5\' 8"  5\' 8"  5\' 8"   BMI 30.48 kg/m2 30.48 kg/m2 29.81 kg/m2    Current exercise per week 60 minutes.

## 2014-09-17 LAB — CBC WITH DIFFERENTIAL/PLATELET
BASOS PCT: 1 % (ref 0–1)
Basophils Absolute: 0.1 10*3/uL (ref 0.0–0.1)
Eosinophils Absolute: 0.3 10*3/uL (ref 0.0–0.7)
Eosinophils Relative: 4 % (ref 0–5)
HEMATOCRIT: 37.9 % — AB (ref 39.0–52.0)
Hemoglobin: 12.7 g/dL — ABNORMAL LOW (ref 13.0–17.0)
LYMPHS ABS: 3.2 10*3/uL (ref 0.7–4.0)
Lymphocytes Relative: 46 % (ref 12–46)
MCH: 30.8 pg (ref 26.0–34.0)
MCHC: 33.5 g/dL (ref 30.0–36.0)
MCV: 92 fL (ref 78.0–100.0)
MONOS PCT: 8 % (ref 3–12)
MPV: 10.3 fL (ref 8.6–12.4)
Monocytes Absolute: 0.6 10*3/uL (ref 0.1–1.0)
Neutro Abs: 2.9 10*3/uL (ref 1.7–7.7)
Neutrophils Relative %: 41 % — ABNORMAL LOW (ref 43–77)
Platelets: 314 10*3/uL (ref 150–400)
RBC: 4.12 MIL/uL — ABNORMAL LOW (ref 4.22–5.81)
RDW: 13 % (ref 11.5–15.5)
WBC: 7 10*3/uL (ref 4.0–10.5)

## 2014-09-18 ENCOUNTER — Telehealth: Payer: Self-pay | Admitting: *Deleted

## 2014-09-18 ENCOUNTER — Ambulatory Visit (INDEPENDENT_AMBULATORY_CARE_PROVIDER_SITE_OTHER): Payer: BLUE CROSS/BLUE SHIELD | Admitting: Family Medicine

## 2014-09-18 ENCOUNTER — Encounter: Payer: Self-pay | Admitting: Family Medicine

## 2014-09-18 VITALS — BP 122/82 | HR 82 | Resp 16 | Ht 68.0 in | Wt 196.0 lb

## 2014-09-18 DIAGNOSIS — G4700 Insomnia, unspecified: Secondary | ICD-10-CM

## 2014-09-18 DIAGNOSIS — I10 Essential (primary) hypertension: Secondary | ICD-10-CM | POA: Diagnosis not present

## 2014-09-18 DIAGNOSIS — F418 Other specified anxiety disorders: Secondary | ICD-10-CM

## 2014-09-18 DIAGNOSIS — E119 Type 2 diabetes mellitus without complications: Secondary | ICD-10-CM

## 2014-09-18 DIAGNOSIS — E785 Hyperlipidemia, unspecified: Secondary | ICD-10-CM

## 2014-09-18 DIAGNOSIS — E663 Overweight: Secondary | ICD-10-CM

## 2014-09-18 LAB — LIPID PANEL
Cholesterol: 189 mg/dL (ref 0–200)
HDL: 35 mg/dL — ABNORMAL LOW (ref >=40)
LDL Cholesterol: 131 mg/dL — ABNORMAL HIGH (ref 0–99)
Total CHOL/HDL Ratio: 5.4 Ratio
Triglycerides: 113 mg/dL (ref <150)
VLDL: 23 mg/dL (ref 0–40)

## 2014-09-18 LAB — COMPLETE METABOLIC PANEL WITH GFR
ALK PHOS: 71 U/L (ref 39–117)
ALT: 26 U/L (ref 0–53)
AST: 20 U/L (ref 0–37)
Albumin: 4.3 g/dL (ref 3.5–5.2)
BILIRUBIN TOTAL: 0.7 mg/dL (ref 0.2–1.2)
BUN: 29 mg/dL — ABNORMAL HIGH (ref 6–23)
CO2: 27 mEq/L (ref 19–32)
Calcium: 9.7 mg/dL (ref 8.4–10.5)
Chloride: 98 mEq/L (ref 96–112)
Creat: 1.93 mg/dL — ABNORMAL HIGH (ref 0.50–1.35)
GFR, Est African American: 44 mL/min — ABNORMAL LOW
GFR, Est Non African American: 38 mL/min — ABNORMAL LOW
Glucose, Bld: 147 mg/dL — ABNORMAL HIGH (ref 70–99)
Potassium: 4.2 mEq/L (ref 3.5–5.3)
SODIUM: 136 meq/L (ref 135–145)
Total Protein: 7.3 g/dL (ref 6.0–8.3)

## 2014-09-18 LAB — MICROALBUMIN / CREATININE URINE RATIO
Creatinine, Urine: 247.1 mg/dL
Microalb Creat Ratio: 2.8 mg/g (ref 0.0–30.0)
Microalb, Ur: 0.7 mg/dL (ref <2.0)

## 2014-09-18 LAB — TSH: TSH: 2.908 u[IU]/mL (ref 0.350–4.500)

## 2014-09-18 LAB — HEMOGLOBIN A1C
Hgb A1c MFr Bld: 6.7 % — ABNORMAL HIGH (ref <5.7)
Mean Plasma Glucose: 146 mg/dL — ABNORMAL HIGH (ref <117)

## 2014-09-18 MED ORDER — ALPRAZOLAM 0.5 MG PO TABS: ORAL_TABLET | ORAL | Status: DC

## 2014-09-18 MED ORDER — FLUOXETINE HCL 40 MG PO CAPS: 40.0000 mg | ORAL_CAPSULE | Freq: Every day | ORAL | Status: DC

## 2014-09-18 MED ORDER — TEMAZEPAM 30 MG PO CAPS: ORAL_CAPSULE | ORAL | Status: DC

## 2014-09-18 MED ORDER — GLIPIZIDE 5 MG PO TABS: 5.0000 mg | ORAL_TABLET | Freq: Every day | ORAL | Status: DC

## 2014-09-18 MED ORDER — BENAZEPRIL HCL 10 MG PO TABS: 10.0000 mg | ORAL_TABLET | Freq: Every day | ORAL | Status: DC

## 2014-09-18 NOTE — Patient Instructions (Signed)
F/u in 4.5 month, call if you need me before  You are referred to Dr Harrington Challenger , psychiatrist , for help with depression  Dose of fluoxetine is 44m daily, take two 262mtablets daily till done  Start fishing, doing exercise daily, going back [place] [employer] the activities that will help you to feel better, and improve  ENSURE 64 ounces water daily , No pain medication other than tylenol please  Non fast chem 7 and EGFR in 4 weeks  Hope you feel better soon

## 2014-09-18 NOTE — Telephone Encounter (Signed)
Will fax in sleep meds

## 2014-09-18 NOTE — Telephone Encounter (Signed)
Pt called requesting to speak with the nurse he spoke with this morning. Please advise

## 2014-09-20 NOTE — Assessment & Plan Note (Signed)
Deteriorated , not suicidal or ho,micidal, but unable to focus and concentrate has lost his 4th job, and states the temporary employment agency who had been working him, have told him that he is too great a liability and will no longer work with him. He has had significant decline in function since the untimely death of his son in his early 3520's over 5 years ago Pt to start fluoxetine at the dose recommended at last visit. He is also referred to psychiatry  He is encouraged to attempt to engage in loife as much as possible to help himself also

## 2014-09-20 NOTE — Progress Notes (Signed)
John [name]     MRN: [id number]      DOB: 1960-12-20   HPI John [name] is here for follow up and re-evaluation of chronic medical conditions, medication management and review of any available recent lab and radiology data.  Preventive health is updated, specifically  Cancer screening and Immunization.    The PT denies any adverse reactions to current medications since the last visit.  States he recently lost his job due to inability to focus, states this is the 4th job he has lost since the loss of his son, his depression has not been adequately treated. Unfortunately, though should have had dose increased at last visit, he never filled the new script so he has remained on same inadequate dose He denies suicidal or homicidal ideation or hallucinations, reports increased increased social withdrawal. Acknowledges the fact that he needs more help than current for mental health as he has maintained a downhill spiral over past several years. 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 Denies polyuria, polydipsia, blurred vision , or hypoglycemic episodes.   ROS Denies recent fever or chills. Denies sinus pressure, nasal congestion, ear pain or sore throat. Denies chest congestion, productive cough or wheezing. Denies chest pains, palpitations and leg swelling Denies abdominal pain, nausea, vomiting,diarrhea or constipation.   Denies dysuria, frequency, hesitancy or incontinence. Denies joint pain, swelling and limitation in mobility. Denies headaches, seizures, numbness, or tingling. Denies skin break down or rash.   PE  BP 122/82 mmHg  Pulse 82  Resp 16  Ht  (1.727 m)  Wt 196 lb (88.905 kg)  BMI 29.81 kg/m2  SpO2 100%  Patient alert and oriented and in no cardiopulmonary distress.Flat affect, very slow speech, low voice, no animation during visit  HEENT: No facial asymmetry, EOMI,   oropharynx pink and moist.  Neck supple no JVD, no  mass.  Chest: Clear to auscultation bilaterally.  CVS: S1, S2 no murmurs, no S3.Regular rate.  ABD: Soft non tender.   Ext: No edema  MS: Adequate ROM spine, shoulders, hips and knees.  Skin: Intact, no ulcerations or rash noted.  Psych: Good eye contact,flat affect. Memory intact very t  depressed appearing.  CNS: CN 2-12 intact, power,  normal throughout.no focal deficits noted.   Assessment & Plan   Depression with anxiety Deteriorated , not suicidal or ho,micidal, but unable to focus and concentrate has lost his 4th job, and states the temporary employment agency who had been working him, have told him that he is too great a liability and will no longer work with him. He has had significant decline in function since the untimely death of his son in his early 52's over 5 years ago Pt to start fluoxetine at the dose recommended at last visit. He is also referred to psychiatry  He is encouraged to attempt to engage in loife as much as possible to help himself also    Type 2 diabetes mellitus Controlled, no change in medication Patient educated about the importance of limiting  Carbohydrate intake , the need to commit to daily physical activity for a minimum of 30 minutes , and to commit weight loss. The fact that changes in all these areas will reduce or eliminate all together the development of diabetes is stressed.   Diabetic Labs Latest Ref Rng 09/17/2014 05/07/2014 11/29/2013 10/23/2013 10/22/2013  HbA1c <5.7 % 6.7(H) 5.9(H) - 7.6(H) -  Microalbumin <2.0 mg/dL 0.7 - 6.29 - -  Micro/Creat Ratio 0.0 - 30.0 mg/g 2.8 - 4.6 - -  Chol 0 - 200 mg/dL 098189 119173 - - 147(W215(H)  HDL >=40 mg/dL 29(F35(L) 62(Z37(L) - - 30(Q36(L)  Calc LDL 0 - 99 mg/dL 657(Q131(H) 469(G114(H) - - 295(M143(H)  Triglycerides <150 mg/dL 841113 324110 - - 401(U182(H)  Creatinine 0.50 - 1.35 mg/dL 2.72(Z1.93(H) 3.66(Y1.42(H) - - 4.03(K1.64(H)   BP/Weight 09/18/2014 08/07/2014 07/24/2014 05/08/2014 02/20/2014 01/31/2014 11/26/2013  Systolic BP 122 134 122 130 116 152 152   Diastolic BP 82 80 78 82 68 90 84  Wt. (Lbs) 196 198 196 193 185 202.12 195  BMI 29.81 30.11 29.81 29.35 28.14 30.74 29.66   Foot/eye exam completion dates 11/26/2013  Foot Form Completion Done        Hyperlipidemia LDL goal <100 UnControlled, no change in medication. Hyperlipidemia:Low fat diet discussed and encouraged. Updated lab needed at/ before next visit.     CMP Latest Ref Rng 09/17/2014 05/07/2014 10/22/2013  Glucose 70 - 99 mg/dL 742(V147(H) 956(L118(H) 875(I167(H)  BUN 6 - 23 mg/dL 43(P29(H) 20 18  Creatinine 0.50 - 1.35 mg/dL 2.95(J1.93(H) 8.84(Z1.42(H) 6.60(Y1.64(H)  Sodium 135 - 145 mEq/L 136 138 134(L)  Potassium 3.5 - 5.3 mEq/L 4.2 4.0 3.7  Chloride 96 - 112 mEq/L 98 102 96  CO2 19 - 32 mEq/L 27 27 28   Calcium 8.4 - 10.5 mg/dL 9.7 9.5 9.9  Total Protein 6.0 - 8.3 g/dL 7.3 7.0 7.8  Total Bilirubin 0.2 - 1.2 mg/dL 0.7 0.5 1.1  Alkaline Phos 39 - 117 U/L 71 65 115  AST 0 - 37 U/L 20 30 62(H)  ALT 0 - 53 U/L 26 33 79(H)    Lipid Panel     Component Value Date/Time   CHOL 189 09/17/2014 0834   TRIG 113 09/17/2014 0834   HDL 35* 09/17/2014 0834   CHOLHDL 5.4 09/17/2014 0834   VLDL 23 09/17/2014 0834   LDLCALC 131* 09/17/2014 0834         Insomnia Sleep hygiene reviewed and written information offered also. Prescription sent for  medication needed.    Overweight (BMI 25.0-29.9) Unchanged. Patient re-educated about  the importance of commitment to a  minimum of 150 minutes of exercise per week.  The importance of healthy food choices with portion control discussed. Encouraged to start a food diary, count calories and to consider  joining a support group. Sample diet sheets offered. Goals set by the patient for the next several months.   Wt Readings from Last 3 Encounters:  09/18/14 196 lb (88.905 kg)  08/07/14 198 lb (89.812 kg)  07/24/14 196 lb (88.905 kg)    Body mass index is 29.81 kg/(m^2).  Current exercise per week 30 minutes.

## 2014-09-20 NOTE — Assessment & Plan Note (Signed)
Controlled, no change in medication Patient educated about the importance of limiting  Carbohydrate intake , the need to commit to daily physical activity for a minimum of 30 minutes , and to commit weight loss. The fact that changes in all these areas will reduce or eliminate all together the development of diabetes is stressed.   Diabetic Labs Latest Ref Rng 09/17/2014 05/07/2014 11/29/2013 10/23/2013 10/22/2013  HbA1c <5.7 % 6.7(H) 5.9(H) - 7.6(H) -  Microalbumin <2.0 mg/dL 0.7 - 8.291.18 - -  Micro/Creat Ratio 0.0 - 30.0 mg/g 2.8 - 4.6 - -  Chol 0 - 200 mg/dL 562189 130173 - - 865(H215(H)  HDL >=40 mg/dL 84(O35(L) 96(E37(L) - - 95(M36(L)  Calc LDL 0 - 99 mg/dL 841(L131(H) 244(W114(H) - - 102(V143(H)  Triglycerides <150 mg/dL 253113 664110 - - 403(K182(H)  Creatinine 0.50 - 1.35 mg/dL 7.42(V1.93(H) 9.56(L1.42(H) - - 8.75(I1.64(H)   BP/Weight 09/18/2014 08/07/2014 07/24/2014 05/08/2014 02/20/2014 01/31/2014 11/26/2013  Systolic BP 122 134 122 130 116 152 152  Diastolic BP 82 80 78 82 68 90 84  Wt. (Lbs) 196 198 196 193 185 202.12 195  BMI 29.81 30.11 29.81 29.35 28.14 30.74 29.66   Foot/eye exam completion dates 11/26/2013  Foot Form Completion Done

## 2014-09-20 NOTE — Assessment & Plan Note (Addendum)
UnControlled, no change in medication. Hyperlipidemia:Low fat diet discussed and encouraged. Updated lab needed at/ before next visit.     CMP Latest Ref Rng 09/17/2014 05/07/2014 10/22/2013  Glucose 70 - 99 mg/dL 409(W147(H) 119(J118(H) 478(G167(H)  BUN 6 - 23 mg/dL 95(A29(H) 20 18  Creatinine 0.50 - 1.35 mg/dL 2.13(Y1.93(H) 8.65(H1.42(H) 8.46(N1.64(H)  Sodium 135 - 145 mEq/L 136 138 134(L)  Potassium 3.5 - 5.3 mEq/L 4.2 4.0 3.7  Chloride 96 - 112 mEq/L 98 102 96  CO2 19 - 32 mEq/L 27 27 28   Calcium 8.4 - 10.5 mg/dL 9.7 9.5 9.9  Total Protein 6.0 - 8.3 g/dL 7.3 7.0 7.8  Total Bilirubin 0.2 - 1.2 mg/dL 0.7 0.5 1.1  Alkaline Phos 39 - 117 U/L 71 65 115  AST 0 - 37 U/L 20 30 62(H)  ALT 0 - 53 U/L 26 33 79(H)    Lipid Panel     Component Value Date/Time   CHOL 189 09/17/2014 0834   TRIG 113 09/17/2014 0834   HDL 35* 09/17/2014 0834   CHOLHDL 5.4 09/17/2014 0834   VLDL 23 09/17/2014 0834   LDLCALC 131* 09/17/2014 0834

## 2014-09-20 NOTE — Assessment & Plan Note (Addendum)
Unchanged. Patient re-educated about  the importance of commitment to a  minimum of 150 minutes of exercise per week.  The importance of healthy food choices with portion control discussed. Encouraged to start a food diary, count calories and to consider  joining a support group. Sample diet sheets offered. Goals set by the patient for the next several months.   Wt Readings from Last 3 Encounters:  09/18/14 196 lb (88.905 kg)  08/07/14 198 lb (89.812 kg)  07/24/14 196 lb (88.905 kg)    Body mass index is 29.81 kg/(m^2).  Current exercise per week 30 minutes.

## 2014-09-20 NOTE — Assessment & Plan Note (Signed)
Sleep hygiene reviewed and written information offered also. Prescription sent for  medication needed.  

## 2014-09-21 DIAGNOSIS — R05 Cough: Secondary | ICD-10-CM | POA: Insufficient documentation

## 2014-09-21 DIAGNOSIS — R059 Cough, unspecified: Secondary | ICD-10-CM | POA: Insufficient documentation

## 2014-09-21 NOTE — Assessment & Plan Note (Signed)
Uncontrolled, no med change Hyperlipidemia:Low fat diet discussed and encouraged.  CMP Latest Ref Rng 09/17/2014 05/07/2014 10/22/2013  Glucose 70 - 99 mg/dL 324(M147(H) 010(U118(H) 725(D167(H)  BUN 6 - 23 mg/dL 66(Y29(H) 20 18  Creatinine 0.50 - 1.35 mg/dL 4.03(K1.93(H) 7.42(V1.42(H) 9.56(L1.64(H)  Sodium 135 - 145 mEq/L 136 138 134(L)  Potassium 3.5 - 5.3 mEq/L 4.2 4.0 3.7  Chloride 96 - 112 mEq/L 98 102 96  CO2 19 - 32 mEq/L 27 27 28   Calcium 8.4 - 10.5 mg/dL 9.7 9.5 9.9  Total Protein 6.0 - 8.3 g/dL 7.3 7.0 7.8  Total Bilirubin 0.2 - 1.2 mg/dL 0.7 0.5 1.1  Alkaline Phos 39 - 117 U/L 71 65 115  AST 0 - 37 U/L 20 30 62(H)  ALT 0 - 53 U/L 26 33 79(H)    Lipid Panel     Component Value Date/Time   CHOL 189 09/17/2014 0834   TRIG 113 09/17/2014 0834   HDL 35* 09/17/2014 0834   CHOLHDL 5.4 09/17/2014 0834   VLDL 23 09/17/2014 0834   LDLCALC 131* 09/17/2014 0834

## 2014-09-21 NOTE — Assessment & Plan Note (Signed)
Due to increased allergy symptoms, no symptoms or signs of infection, pt to call if worsens

## 2014-09-21 NOTE — Assessment & Plan Note (Signed)
Controlled, no change in medication DASH diet and commitment to daily physical activity for a minimum of 30 minutes discussed and encouraged, as a part of hypertension management. The importance of attaining a healthy weight is also discussed.  BP/Weight 09/18/2014 08/07/2014 07/24/2014 05/08/2014 02/20/2014 01/31/2014 11/26/2013  Systolic BP 122 134 122 130 116 152 152  Diastolic BP 82 80 78 82 68 90 84  Wt. (Lbs) 196 198 196 193 185 202.12 195  BMI 29.81 30.11 29.81 29.35 28.14 30.74 29.66    CMP Latest Ref Rng 09/17/2014 05/07/2014 10/22/2013  Glucose 70 - 99 mg/dL 409(W147(H) 119(J118(H) 478(G167(H)  BUN 6 - 23 mg/dL 95(A29(H) 20 18  Creatinine 0.50 - 1.35 mg/dL 2.13(Y1.93(H) 8.65(H1.42(H) 8.46(N1.64(H)  Sodium 135 - 145 mEq/L 136 138 134(L)  Potassium 3.5 - 5.3 mEq/L 4.2 4.0 3.7  Chloride 96 - 112 mEq/L 98 102 96  CO2 19 - 32 mEq/L 27 27 28   Calcium 8.4 - 10.5 mg/dL 9.7 9.5 9.9  Total Protein 6.0 - 8.3 g/dL 7.3 7.0 7.8  Total Bilirubin 0.2 - 1.2 mg/dL 0.7 0.5 1.1  Alkaline Phos 39 - 117 U/L 71 65 115  AST 0 - 37 U/L 20 30 62(H)  ALT 0 - 53 U/L 26 33 79(H)

## 2014-09-21 NOTE — Assessment & Plan Note (Signed)
Improved but still not adequately treated, increase fluoxetine to 40mg  daily Not suicidal or homicidal, improved level of function

## 2014-09-21 NOTE — Progress Notes (Signed)
Subjective:    Patient ID: John [name], male    DOB: 1960/10/23, 54 y.o.   MRN: [id number]  HPI The PT is here for follow up and re-evaluation of chronic medical conditions, medication management and review of any available recent lab and radiology data.  Preventive health is updated, specifically  Cancer screening and Immunization.   Questions or concerns regarding consultations or procedures which the PT has had in the interim are  addressed. The PT denies any adverse reactions to current medications since the last visit.  5 day h/o cough with clear sputum and increased post nasal drainage, ne fever or chills, uses nyquil for relief, Cough is worse when lying down     Review of Systems See HPI Denies recent fever or chills. Denies sinus pressure, nasal congestion, ear pain or sore throat.  Denies chest pains, palpitations and leg swelling Denies abdominal pain, nausea, vomiting,diarrhea or constipation.   Denies dysuria, frequency, hesitancy or incontinence. Denies joint pain, swelling and limitation in mobility. Denies headaches, seizures, numbness, or tingling. depression, anxiety and insomnia are improved on medication. Denies skin break down or rash.        Objective:   Physical Exam  BP 130/84 mmHg  Pulse 95  Resp 16  Ht  (1.727 m)  Wt 199 lb 1.9 oz (90.32 kg)  BMI 30.28 kg/m2  SpO2 96% Patient alert and oriented and in no cardiopulmonary distress.  HEENT: No facial asymmetry, EOMI,   oropharynx pink and moist.  Neck supple no JVD, no mass.  Chest: Clear to auscultation bilaterally.  CVS: S1, S2 no murmurs, no S3.Regular rate.  ABD: Soft non tender.   Ext: No edema  MS: Adequate ROM spine, shoulders, hips and knees.  Skin: Intact, no ulcerations or rash noted.  Psych: Good eye contact, normal affect. Memory intact not anxious or depressed appearing.  CNS: CN 2-12 intact, power,  normal throughout.no focal deficits noted.         Assessment & Plan:  Depression with anxiety Improved but still not adequately treated, increase fluoxetine to  daily Not suicidal or homicidal, improved level of function   Hyperlipidemia LDL goal <100 Uncontrolled, no med change Hyperlipidemia:Low fat diet discussed and encouraged.  CMP Latest Ref Rng 09/17/2014 05/07/2014 10/22/2013  Glucose 70 - 99 mg/dL 914(N) 829(F) 621(H)  BUN 6 - 23 mg/dL 08(M) 20 18  Creatinine 0.50 - 1.35 mg/dL 5.78(I) 6.96(E) 9.52(W)  Sodium 135 - 145 mEq/L 136 138 134(L)  Potassium 3.5 - 5.3 mEq/L 4.2 4.0 3.7  Chloride 96 - 112 mEq/L 98 102 96  CO2 19 - 32 mEq/L Calcium 8.4 - 10.5 mg/dL 9.7 9.5 9.9  Total Protein 6.0 - 8.3 g/dL 7.3 7.0 7.8  Total Bilirubin 0.2 - 1.2 mg/dL 0.7 0.5 1.1  Alkaline Phos 39 - 117 U/L 71 65 115  AST 0 - 37 U/L 20 30 62(H)  ALT 0 - 53 U/L 26 33 79(H)    Lipid Panel     Component Value Date/Time   CHOL 189 09/17/2014 0834   TRIG 113 09/17/2014 0834   HDL 35* 09/17/2014 0834   CHOLHDL 5.4 09/17/2014 0834   VLDL 23 09/17/2014 0834   LDLCALC 131* 09/17/2014 0834         Essential hypertension Controlled, no change in medication DASH diet and commitment to daily physical activity for a minimum of 30 minutes discussed and encouraged, as a part of hypertension management. The importance  of attaining a healthy weight is also discussed.  BP/Weight 09/18/2014 08/07/2014 07/24/2014 05/08/2014 02/20/2014 01/31/2014 11/26/2013  Systolic BP 122 134 122 130 116 152 152  Diastolic BP 82 80 78 82 68 90 84  Wt. (Lbs) 196 198 196 193 185 202.12 195  BMI 29.81 30.11 29.81 29.35 28.14 30.74 29.66    CMP Latest Ref Rng 09/17/2014 05/07/2014 10/22/2013  Glucose 70 - 99 mg/dL 956(O147(H) 130(Q118(H) 657(Q167(H)  BUN 6 - 23 mg/dL 46(N29(H) 20 18  Creatinine 0.50 - 1.35 mg/dL 6.29(B1.93(H) 2.84(X1.42(H) 3.24(M1.64(H)  Sodium 135 - 145 mEq/L 136 138 134(L)  Potassium 3.5 - 5.3 mEq/L 4.2 4.0 3.7  Chloride 96 - 112 mEq/L 98 102 96  CO2 19 - 32 mEq/L 27 27 28    Calcium 8.4 - 10.5 mg/dL 9.7 9.5 9.9  Total Protein 6.0 - 8.3 g/dL 7.3 7.0 7.8  Total Bilirubin 0.2 - 1.2 mg/dL 0.7 0.5 1.1  Alkaline Phos 39 - 117 U/L 71 65 115  AST 0 - 37 U/L 20 30 62(H)  ALT 0 - 53 U/L 26 33 79(H)        Cough Due to increased allergy symptoms, no symptoms or signs of infection, pt to call if worsens

## 2014-10-31 ENCOUNTER — Ambulatory Visit (INDEPENDENT_AMBULATORY_CARE_PROVIDER_SITE_OTHER): Payer: BLUE CROSS/BLUE SHIELD | Admitting: Psychiatry

## 2014-10-31 ENCOUNTER — Encounter (HOSPITAL_COMMUNITY): Payer: Self-pay | Admitting: Psychiatry

## 2014-10-31 VITALS — BP 116/75 | HR 66 | Ht 68.0 in | Wt 200.4 lb

## 2014-10-31 DIAGNOSIS — F4310 Post-traumatic stress disorder, unspecified: Secondary | ICD-10-CM | POA: Diagnosis not present

## 2014-10-31 DIAGNOSIS — F329 Major depressive disorder, single episode, unspecified: Secondary | ICD-10-CM

## 2014-10-31 DIAGNOSIS — F322 Major depressive disorder, single episode, severe without psychotic features: Secondary | ICD-10-CM

## 2014-10-31 MED ORDER — VENLAFAXINE HCL ER 150 MG PO CP24
300.0000 mg | ORAL_CAPSULE | Freq: Every day | ORAL | Status: DC
Start: 2014-10-31 — End: 2014-12-25

## 2014-10-31 NOTE — Patient Instructions (Signed)
Stop Fluoxetine Start Venlafaxine 150 mg, 2 in the am
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
## 2014-11-24 ENCOUNTER — Ambulatory Visit (HOSPITAL_COMMUNITY): Payer: BLUE CROSS/BLUE SHIELD | Admitting: Psychiatry

## 2014-11-28 ENCOUNTER — Ambulatory Visit (INDEPENDENT_AMBULATORY_CARE_PROVIDER_SITE_OTHER): Payer: BLUE CROSS/BLUE SHIELD | Admitting: Psychiatry

## 2014-11-28 ENCOUNTER — Encounter (HOSPITAL_COMMUNITY): Payer: Self-pay | Admitting: Psychiatry

## 2014-11-28 VITALS — BP 107/73 | HR 103 | Ht 68.0 in | Wt 200.4 lb

## 2014-11-28 DIAGNOSIS — F322 Major depressive disorder, single episode, severe without psychotic features: Secondary | ICD-10-CM | POA: Diagnosis not present

## 2014-11-28 DIAGNOSIS — F4310 Post-traumatic stress disorder, unspecified: Secondary | ICD-10-CM | POA: Diagnosis not present

## 2014-11-28 MED ORDER — ZOLPIDEM TARTRATE ER 12.5 MG PO TBCR: 12.5000 mg | EXTENDED_RELEASE_TABLET | Freq: Every day | ORAL | Status: DC

## 2014-11-28 MED ORDER — ALPRAZOLAM 0.5 MG PO TABS: 0.5000 mg | ORAL_TABLET | Freq: Every day | ORAL | Status: DC
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
## 2014-12-01 NOTE — Assessment & Plan Note (Addendum)
Not controlled, not actively suicidal or homicidal , but ongoing deterioration in ability to function, work excuse for limited time, resists therapy and treatment at thsi time, though I believe that he needs this. If unable to return to work consistently, he agrees to see psychiatry Increase fluoxetine to 40 mg daily

## 2014-12-01 NOTE — Assessment & Plan Note (Signed)
Controlled, no change in medication DASH diet and commitment to daily physical activity for a minimum of 30 minutes discussed and encouraged, as a part of hypertension management. The importance of attaining a healthy weight is also discussed.  BP/Weight 11/28/2014 10/31/2014 09/18/2014 08/07/2014 07/24/2014 05/08/2014 02/20/2014  Systolic BP 107 116 122 134 122 130 116  Diastolic BP 73 75 82 80 78 82 68  Wt. (Lbs) 200.4 200.4 196 198 196 193 185  BMI 30.48 30.48 29.81 30.11 29.81 29.35 28.14

## 2014-12-01 NOTE — Assessment & Plan Note (Signed)
Hyperlipidemia:Low fat diet discussed and encouraged.   Lipid Panel  Lab Results  Component Value Date   CHOL 189 09/17/2014   HDL 35* 09/17/2014   LDLCALC 131* 09/17/2014   TRIG 113 09/17/2014   CHOLHDL 5.4 09/17/2014   Uncontrolled, med adherence and lower fat diet needed

## 2014-12-01 NOTE — Assessment & Plan Note (Signed)
Deteriorated. Patient re-educated about  the importance of commitment to a  minimum of 150 minutes of exercise per week.  The importance of healthy food choices with portion control discussed. Encouraged to start a food diary, count calories and to consider  joining a support group. Sample diet sheets offered. Goals set by the patient for the next several months.   Weight /BMI 11/28/2014 10/31/2014 09/18/2014  WEIGHT 200 lb 6.4 oz 200 lb 6.4 oz 196 lb  HEIGHT 5\' 8"  5\' 8"  5\' 8"   BMI 30.48 kg/m2 30.48 kg/m2 29.81 kg/m2    Current exercise per week 60 minutes.

## 2014-12-01 NOTE — Assessment & Plan Note (Signed)
Sleep hygiene reviewed and written information offered also.Sub optimal control. Prescription sent for  medication needed.

## 2014-12-01 NOTE — Assessment & Plan Note (Signed)
Patient educated about the importance of limiting  Carbohydrate intake , the need to commit to daily physical activity for a minimum of 30 minutes , and to commit weight loss. Controlled  Diabetic Labs Latest Ref Rng 09/17/2014 05/07/2014 11/29/2013 10/23/2013 10/22/2013  HbA1c <5.7 % 6.7(H) 5.9(H) - 7.6(H) -  Microalbumin <2.0 mg/dL 0.7 - 1.57 - -  Micro/Creat Ratio 0.0 - 30.0 mg/g 2.8 - 4.6 - -  Chol 0 - 200 mg/dL 262 035 - - 597(C)  HDL >=40 mg/dL 16(L) 84(T) - - 36(I)  Calc LDL 0 - 99 mg/dL 680(H) 212(Y) - - 482(N)  Triglycerides <150 mg/dL 003 704 - - 888(B)  Creatinine 0.50 - 1.35 mg/dL 1.69(I) 5.03(U) - - 8.82(C)   BP/Weight 11/28/2014 10/31/2014 09/18/2014 08/07/2014 07/24/2014 05/08/2014 02/20/2014  Systolic BP 107 116 122 134 122 130 116  Diastolic BP 73 75 82 80 78 82 68  Wt. (Lbs) 200.4 200.4 196 198 196 193 185  BMI 30.48 30.48 29.81 30.11 29.81 29.35 28.14   Foot/eye exam completion dates 11/26/2013  Foot Form Completion Done

## 2014-12-03 ENCOUNTER — Ambulatory Visit (HOSPITAL_COMMUNITY): Payer: BLUE CROSS/BLUE SHIELD | Admitting: Psychiatry

## 2014-12-08 ENCOUNTER — Ambulatory Visit: Payer: Self-pay | Admitting: Family Medicine

## 2014-12-11 ENCOUNTER — Telehealth (HOSPITAL_COMMUNITY): Payer: Self-pay | Admitting: *Deleted

## 2014-12-11 NOTE — Telephone Encounter (Signed)
Prior authorization for Ambien CR received. Called [phone] spoke with Irving Burton who states it will be sent for review and decision faxed.

## 2014-12-12 NOTE — Telephone Encounter (Signed)
noted 

## 2014-12-13 ENCOUNTER — Other Ambulatory Visit: Payer: Self-pay | Admitting: Family Medicine

## 2014-12-25 ENCOUNTER — Ambulatory Visit (INDEPENDENT_AMBULATORY_CARE_PROVIDER_SITE_OTHER): Payer: BLUE CROSS/BLUE SHIELD | Admitting: Psychiatry

## 2014-12-25 ENCOUNTER — Encounter (HOSPITAL_COMMUNITY): Payer: Self-pay | Admitting: Psychiatry

## 2014-12-25 VITALS — BP 135/85 | HR 76 | Ht 68.0 in | Wt 206.6 lb

## 2014-12-25 DIAGNOSIS — F329 Major depressive disorder, single episode, unspecified: Secondary | ICD-10-CM | POA: Diagnosis not present

## 2014-12-25 DIAGNOSIS — F322 Major depressive disorder, single episode, severe without psychotic features: Secondary | ICD-10-CM

## 2014-12-25 DIAGNOSIS — F4310 Post-traumatic stress disorder, unspecified: Secondary | ICD-10-CM | POA: Diagnosis not present

## 2014-12-25 MED ORDER — TRAZODONE HCL 100 MG PO TABS: 100.0000 mg | ORAL_TABLET | Freq: Every day | ORAL | Status: DC

## 2014-12-25 MED ORDER — VENLAFAXINE HCL ER 150 MG PO CP24: 300.0000 mg | ORAL_CAPSULE | Freq: Every day | ORAL | Status: DC
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
## 2015-01-19 ENCOUNTER — Encounter: Payer: Self-pay | Admitting: Family Medicine

## 2015-01-19 ENCOUNTER — Ambulatory Visit (INDEPENDENT_AMBULATORY_CARE_PROVIDER_SITE_OTHER): Payer: BLUE CROSS/BLUE SHIELD | Admitting: Family Medicine

## 2015-01-19 VITALS — BP 108/70 | HR 90 | Resp 18 | Ht 68.0 in | Wt 202.0 lb

## 2015-01-19 DIAGNOSIS — G4700 Insomnia, unspecified: Secondary | ICD-10-CM

## 2015-01-19 DIAGNOSIS — E119 Type 2 diabetes mellitus without complications: Secondary | ICD-10-CM

## 2015-01-19 DIAGNOSIS — E785 Hyperlipidemia, unspecified: Secondary | ICD-10-CM | POA: Diagnosis not present

## 2015-01-19 DIAGNOSIS — I10 Essential (primary) hypertension: Secondary | ICD-10-CM

## 2015-01-19 DIAGNOSIS — E663 Overweight: Secondary | ICD-10-CM

## 2015-01-19 DIAGNOSIS — Z114 Encounter for screening for human immunodeficiency virus [HIV]: Secondary | ICD-10-CM

## 2015-01-19 DIAGNOSIS — F418 Other specified anxiety disorders: Secondary | ICD-10-CM

## 2015-01-19 NOTE — Assessment & Plan Note (Signed)
John [name] is reminded of the importance of commitment to daily physical activity for 30 minutes or more, as able and the need to limit carbohydrate intake to 30 to 60 grams per meal to help with blood sugar control.   The need to take medication as prescribed, test blood sugar as directed, and to call between visits if there is a concern that blood sugar is uncontrolled is also discussed.   John [name] is reminded of the importance of daily foot exam, annual eye examination, and good blood sugar, blood pressure and cholesterol control.  Diabetic Labs Latest Ref Rng 09/17/2014 05/07/2014 11/29/2013 10/23/2013 10/22/2013  HbA1c <5.7 % 6.7(H) 5.9(H) - 7.6(H) -  Microalbumin <2.0 mg/dL 0.7 - 1.61 - -  Micro/Creat Ratio 0.0 - 30.0 mg/g 2.8 - 4.6 - -  Chol 0 - 200 mg/dL 096 045 - - 409(W)  HDL >=40 mg/dL 11(B) 14(N) - - 82(N)  Calc LDL 0 - 99 mg/dL 562(Z) 308(M) - - 578(I)  Triglycerides <150 mg/dL 696 295 - - 284(X)  Creatinine 0.50 - 1.35 mg/dL 3.24(M) 0.10(U) - - 7.25(D)   BP/Weight 01/19/2015 09/18/2014 08/07/2014 07/24/2014 05/08/2014 02/20/2014 01/31/2014  Systolic BP 108 122 134 122 130 116 152  Diastolic BP 70 82 80 78 82 68 90  Wt. (Lbs) 202 196 198 196 193 185 202.12  BMI 30.72 29.81 30.11 29.81 29.35 28.14 30.74  Some encounter information is confidential and restricted. Go to Review Flowsheets activity to see all data.   Foot/eye exam completion dates 11/26/2013  Foot Form Completion Done   Updated lab needed at/ before next visit.

## 2015-01-19 NOTE — Assessment & Plan Note (Signed)
Hyperlipidemia:Low fat diet discussed and encouraged.   Lipid Panel  Lab Results  Component Value Date   CHOL 189 09/17/2014   HDL 35* 09/17/2014   LDLCALC 131* 09/17/2014   TRIG 113 09/17/2014   CHOLHDL 5.4 09/17/2014   Updated lab needed at/ before next visit.

## 2015-01-19 NOTE — Assessment & Plan Note (Signed)
Improved wit medication

## 2015-01-19 NOTE — Patient Instructions (Addendum)
Physical exam in 4 month, call if you need me before  Please commit to daily exercise and weight loss, you have gained 10 pounds!  Labs today  Thanks for choosing [facility], we consider it a privelige to serve you.   You are referred for eye exam

## 2015-01-19 NOTE — Assessment & Plan Note (Signed)
Controlled, no change in medication DASH diet and commitment to daily physical activity for a minimum of 30 minutes discussed and encouraged, as a part of hypertension management. The importance of attaining a healthy weight is also discussed.  BP/Weight 01/19/2015 09/18/2014 08/07/2014 07/24/2014 05/08/2014 02/20/2014 01/31/2014  Systolic BP 108 122 134 122 130 116 152  Diastolic BP 70 82 80 78 82 68 90  Wt. (Lbs) 202 196 198 196 193 185 202.12  BMI 30.72 29.81 30.11 29.81 29.35 28.14 30.74  Some encounter information is confidential and restricted. Go to Review Flowsheets activity to see all data.

## 2015-01-19 NOTE — Assessment & Plan Note (Signed)
Deteriorated. Patient re-educated about  the importance of commitment to a  minimum of 150 minutes of exercise per week.  The importance of healthy food choices with portion control discussed. Encouraged to start a food diary, count calories and to consider  joining a support group. Sample diet sheets offered. Goals set by the patient for the next several months.   Weight /BMI 01/19/2015 09/18/2014 08/07/2014  WEIGHT 202 lb 196 lb 198 lb  HEIGHT     BMI 30.72 kg/m2 29.81 kg/m2 30.11 kg/m2  Some encounter information is confidential and restricted. Go to Review Flowsheets activity to see all data.    Current exercise per week 0 minutes.

## 2015-01-26 ENCOUNTER — Other Ambulatory Visit: Payer: Self-pay | Admitting: Family Medicine

## 2015-02-07 NOTE — Progress Notes (Signed)
AMAHD John [name]     MRN: [id number]      DOB: September 13, 1960   HPI [name] is here for follow up and re-evaluation of chronic medical conditions, medication management and review of any available recent lab and radiology data.  Preventive health is updated, specifically  Cancer screening and Immunization.   Questions or concerns regarding consultations or procedures which the PT has had in the interim are  addressed. The PT denies any adverse reactions to current medications since the last visit.  There are no new concerns.  There are no specific complaints  Denies polyuria, polydipsia, blurred vision , or hypoglycemic episodes. Still not  Exercising and has continued to gain weight  ROS Denies recent fever or chills. Denies sinus pressure, nasal congestion, ear pain or sore throat. Denies chest congestion, productive cough or wheezing. Denies chest pains, palpitations and leg swelling Denies abdominal pain, nausea, vomiting,diarrhea or constipation.   Denies dysuria, frequency, hesitancy or incontinence. Denies joint pain, swelling and limitation in mobility. Denies headaches, seizures, numbness, or tingling. C/o depression, anxiety and  Insomnia.Not suicidal or homicidal, being treted by psych denies any significant improvement in level of function, still feels incapable of returning to wpork Denies skin break down or rash.   PE  BP 108/70 mmHg  Pulse 90  Resp 18  Ht 5\' 8"  (1.727 m)  Wt 202 lb (91.627 kg)  BMI 30.72 kg/m2  SpO2 97%  Patient alert and oriented and in no cardiopulmonary distress.  HEENT: No facial asymmetry, EOMI,   oropharynx pink and moist.  Neck supple no JVD, no mass.  Chest: Clear to auscultation bilaterally.  CVS: S1, S2 no murmurs, no S3.Regular rate.  ABD: Soft non tender.   Ext: No edema  MS: Adequate ROM spine, shoulders, hips and knees.  Skin: Intact, no ulcerations or rash noted.  Psych: Good eye contact, flat affect. Memory intact  not anxious mildly  depressed appearing.  CNS: CN 2-12 intact, power,  normal throughout.no focal deficits noted.   Assessment & Plan   Essential hypertension Controlled, no change in medication DASH diet and commitment to daily physical activity for a minimum of 30 minutes discussed and encouraged, as a part of hypertension management. The importance of attaining a healthy weight is also discussed.  BP/Weight 01/19/2015 09/18/2014 08/07/2014 07/24/2014 05/08/2014 02/20/2014 01/31/2014  Systolic BP 108 122 134 122 130 116 152  Diastolic BP 70 82 80 78 82 68 90  Wt. (Lbs) 202 196 198 196 193 185 202.12  BMI 30.72 29.81 30.11 29.81 29.35 28.14 30.74  Some encounter information is confidential and restricted. Go to Review Flowsheets activity to see all data.        Type 2 diabetes mellitus [name] is reminded of the importance of commitment to daily physical activity for 30 minutes or more, as able and the need to limit carbohydrate intake to 30 to 60 grams per meal to help with blood sugar control.   The need to take medication as prescribed, test blood sugar as directed, and to call between visits if there is a concern that blood sugar is uncontrolled is also discussed.   [name] is reminded of the importance of daily foot exam, annual eye examination, and good blood sugar, blood pressure and cholesterol control.  Diabetic Labs Latest Ref Rng 09/17/2014 05/07/2014 11/29/2013 10/23/2013 10/22/2013  HbA1c <5.7 % 6.7(H) 5.9(H) - 7.6(H) -  Microalbumin <2.0 mg/dL 0.7 - 4.09 - -  Micro/Creat Ratio 0.0 -  30.0 mg/g 2.8 - 4.6 - -  Chol 0 - 200 mg/dL 962 952 - - 841(L)  HDL >=40 mg/dL 24(M) 01(U) - - 27(O)  Calc LDL 0 - 99 mg/dL 536(U) 440(H) - - 474(Q)  Triglycerides <150 mg/dL 595 638 - - 756(E)  Creatinine 0.50 - 1.35 mg/dL 3.32(R) 5.18(A) - - 4.16(S)   BP/Weight 01/19/2015 09/18/2014 08/07/2014 07/24/2014 05/08/2014 02/20/2014 01/31/2014  Systolic BP 108 122 134 122 130 116 152  Diastolic BP  70 82 80 78 82 68 90  Wt. (Lbs) 202 196 198 196 193 185 202.12  BMI 30.72 29.81 30.11 29.81 29.35 28.14 30.74  Some encounter information is confidential and restricted. Go to Review Flowsheets activity to see all data.   Foot/eye exam completion dates 11/26/2013  Foot Form Completion Done   Updated lab needed at/ before next visit.      Hyperlipidemia LDL goal <100 Hyperlipidemia:Low fat diet discussed and encouraged.   Lipid Panel  Lab Results  Component Value Date   CHOL 189 09/17/2014   HDL 35* 09/17/2014   LDLCALC 131* 09/17/2014   TRIG 113 09/17/2014   CHOLHDL 5.4 09/17/2014   Updated lab needed at/ before next visit.      Insomnia Improved wit medication  Overweight (BMI 25.0-29.9) Deteriorated. Patient re-educated about  the importance of commitment to a  minimum of 150 minutes of exercise per week.  The importance of healthy food choices with portion control discussed. Encouraged to start a food diary, count calories and to consider  joining a support group. Sample diet sheets offered. Goals set by the patient for the next several months.   Weight /BMI 01/19/2015 09/18/2014 08/07/2014  WEIGHT 202 lb 196 lb 198 lb  HEIGHT     BMI 30.72 kg/m2 29.81 kg/m2 30.11 kg/m2  Some encounter information is confidential and restricted. Go to Review Flowsheets activity to see all data.    Current exercise per week 0 minutes.   Depression with anxiety Not suicidal or homicidal, remains demotivated with low energy, being treated by psych. Not able to work currently and seems resigned to this being an indefinite situation

## 2015-02-07 NOTE — Assessment & Plan Note (Signed)
Not suicidal or homicidal, remains demotivated with low energy, being treated by psych. Not able to work currently and seems resigned to this being an indefinite situation

## 2015-02-09 ENCOUNTER — Other Ambulatory Visit: Payer: Self-pay | Admitting: Family Medicine

## 2015-02-09 ENCOUNTER — Ambulatory Visit (INDEPENDENT_AMBULATORY_CARE_PROVIDER_SITE_OTHER): Payer: BLUE CROSS/BLUE SHIELD | Admitting: Psychiatry

## 2015-02-09 ENCOUNTER — Encounter (HOSPITAL_COMMUNITY): Payer: Self-pay | Admitting: Psychiatry

## 2015-02-09 VITALS — BP 153/85 | HR 75 | Ht 68.0 in | Wt 205.0 lb

## 2015-02-09 DIAGNOSIS — F322 Major depressive disorder, single episode, severe without psychotic features: Secondary | ICD-10-CM

## 2015-02-09 DIAGNOSIS — F4310 Post-traumatic stress disorder, unspecified: Secondary | ICD-10-CM

## 2015-02-09 DIAGNOSIS — F329 Major depressive disorder, single episode, unspecified: Secondary | ICD-10-CM | POA: Diagnosis not present

## 2015-02-09 MED ORDER — VENLAFAXINE HCL ER 150 MG PO CP24: 300.0000 mg | ORAL_CAPSULE | Freq: Every day | ORAL | Status: DC

## 2015-02-09 MED ORDER — TRAZODONE HCL 100 MG PO TABS: 100.0000 mg | ORAL_TABLET | Freq: Every day | ORAL | Status: DC

## 2015-02-09 MED ORDER — ALPRAZOLAM 1 MG PO TABS: 1.0000 mg | ORAL_TABLET | Freq: Every day | ORAL | Status: DC | PRN
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
## 2015-02-24 ENCOUNTER — Telehealth: Payer: Self-pay | Admitting: *Deleted

## 2015-02-24 NOTE — Telephone Encounter (Signed)
noted 

## 2015-02-24 NOTE — Telephone Encounter (Signed)
[facility] called stating pt had a appt today at 8:30 pt no showed, they tried to contact pt no answer

## 2015-03-11 ENCOUNTER — Other Ambulatory Visit: Payer: Self-pay | Admitting: Family Medicine

## 2015-04-09 ENCOUNTER — Encounter (HOSPITAL_COMMUNITY): Payer: Self-pay | Admitting: Psychiatry

## 2015-04-09 ENCOUNTER — Ambulatory Visit (INDEPENDENT_AMBULATORY_CARE_PROVIDER_SITE_OTHER): Payer: BLUE CROSS/BLUE SHIELD | Admitting: Psychiatry

## 2015-04-09 VITALS — BP 109/74 | HR 80 | Ht 68.0 in | Wt 202.2 lb

## 2015-04-09 DIAGNOSIS — F322 Major depressive disorder, single episode, severe without psychotic features: Secondary | ICD-10-CM

## 2015-04-09 DIAGNOSIS — F4310 Post-traumatic stress disorder, unspecified: Secondary | ICD-10-CM | POA: Diagnosis not present

## 2015-04-09 MED ORDER — VENLAFAXINE HCL ER 150 MG PO CP24: 300.0000 mg | ORAL_CAPSULE | Freq: Every day | ORAL | Status: DC

## 2015-04-09 MED ORDER — ALPRAZOLAM 1 MG PO TABS: 1.0000 mg | ORAL_TABLET | Freq: Every day | ORAL | Status: DC | PRN

## 2015-04-09 MED ORDER — TRAZODONE HCL 100 MG PO TABS: 100.0000 mg | ORAL_TABLET | Freq: Every day | ORAL | Status: DC
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
## 2015-04-20 ENCOUNTER — Other Ambulatory Visit: Payer: Self-pay | Admitting: Family Medicine

## 2015-05-26 ENCOUNTER — Ambulatory Visit (HOSPITAL_COMMUNITY): Payer: BLUE CROSS/BLUE SHIELD | Admitting: Psychology

## 2015-06-04 ENCOUNTER — Ambulatory Visit (HOSPITAL_COMMUNITY): Payer: BLUE CROSS/BLUE SHIELD | Admitting: Psychiatry

## 2015-06-04 ENCOUNTER — Encounter (HOSPITAL_COMMUNITY): Payer: Self-pay | Admitting: Psychiatry

## 2015-06-11 ENCOUNTER — Ambulatory Visit (INDEPENDENT_AMBULATORY_CARE_PROVIDER_SITE_OTHER): Payer: BLUE CROSS/BLUE SHIELD | Admitting: Family Medicine

## 2015-06-11 ENCOUNTER — Encounter: Payer: Self-pay | Admitting: Family Medicine

## 2015-06-11 VITALS — BP 120/82 | HR 100 | Resp 18 | Ht 68.0 in | Wt 202.0 lb

## 2015-06-11 DIAGNOSIS — E1169 Type 2 diabetes mellitus with other specified complication: Secondary | ICD-10-CM

## 2015-06-11 DIAGNOSIS — E119 Type 2 diabetes mellitus without complications: Secondary | ICD-10-CM

## 2015-06-11 DIAGNOSIS — Z125 Encounter for screening for malignant neoplasm of prostate: Secondary | ICD-10-CM

## 2015-06-11 DIAGNOSIS — Z23 Encounter for immunization: Secondary | ICD-10-CM

## 2015-06-11 DIAGNOSIS — Z0000 Encounter for general adult medical examination without abnormal findings: Secondary | ICD-10-CM | POA: Insufficient documentation

## 2015-06-11 DIAGNOSIS — H547 Unspecified visual loss: Secondary | ICD-10-CM | POA: Diagnosis not present

## 2015-06-11 DIAGNOSIS — Z1211 Encounter for screening for malignant neoplasm of colon: Secondary | ICD-10-CM

## 2015-06-11 DIAGNOSIS — E669 Obesity, unspecified: Secondary | ICD-10-CM

## 2015-06-11 DIAGNOSIS — Z114 Encounter for screening for human immunodeficiency virus [HIV]: Secondary | ICD-10-CM

## 2015-06-11 DIAGNOSIS — I10 Essential (primary) hypertension: Secondary | ICD-10-CM

## 2015-06-11 DIAGNOSIS — E785 Hyperlipidemia, unspecified: Secondary | ICD-10-CM

## 2015-06-11 LAB — POC HEMOCCULT BLD/STL (OFFICE/1-CARD/DIAGNOSTIC): Fecal Occult Blood, POC: NEGATIVE

## 2015-06-11 NOTE — Patient Instructions (Addendum)
F/u in 5 month, call if you need me before  PLEASE get labs in the next 1 to 2 weeks, they are WAY PAST DUE   Exam is good  except for eye exam and you are referred to dr Nile RiggsShapiro  Thanks [facility], we consider it a privelige to serve you.  All the best for 2017!  Flu vaccine today  Check next door for your next appt with Dr Tenny Crawoss, you need to keep the appointments!

## 2015-06-11 NOTE — Progress Notes (Signed)
   Subjective:    Patient ID: John [name], male    DOB: 06-11-1961, 54 y.o.   MRN: [id number]  HPI Patient is in for annual physical exam. No other health concerns are expressed or addressed at the visit. Recent labs, if available are reviewed. Immunization is reviewed , and  Updated.    Review of Systems See HPI     Objective:   Physical Exam BP 120/82 mmHg  Pulse 100  Resp 18  Ht 5\' 8"  (1.727 m)  Wt 202 lb (91.627 kg)  BMI 30.72 kg/m2  SpO2 98%  Pleasant well nourished male, alert and oriented x 3, in no cardio-pulmonary distress. Afebrile. HEENT No facial trauma or asymetry. Sinuses non tender. EOMI, PERTL, . External ears normal, tympanic membranes clear. Oropharynx moist, no exudate, edentulous Neck: supple, no adenopathy,JVD or thyromegaly.No bruits.  Chest: Clear to ascultation bilaterally.No crackles or wheezes. Non tender to palpation  Breast: No asymetry,no masses. No nipple discharge or inversion. No axillary or supraclavicular adenopathy  Cardiovascular system; Heart sounds normal,  S1 and  S2 ,no S3.  No murmur, or thrill. Apical beat not displaced Peripheral pulses normal.  Abdomen: Soft, non tender, no organomegaly or masses. No bruits. Bowel sounds normal. No guarding, tenderness or rebound.  Rectal:  Normal sphincter tone. No hemorrhoids or  masses. guaiac negative stool. Prostate smooth and firm   Musculoskeletal exam: Full ROM of spine, hips , shoulders and knees. No deformity ,swelling or crepitus noted. No muscle wasting or atrophy.   Neurologic: Cranial nerves 2 to 12 intact. Power, tone ,sensation and reflexes normal throughout. No disturbance in gait. No tremor.  Skin: Intact, no ulceration, erythema , scaling or rash noted. Pigmentation normal throughout  Psych; Normal mood and affect. Judgement and concentration normal         Assessment & Plan:  Annual physical exam Annual exam as documented. Counseling  done  re healthy lifestyle involving commitment to 150 minutes exercise per week, heart healthy diet, and attaining healthy weight.The importance of adequate sleep also discussed. Regular seat belt use and home safety, is also discussed. Changes in health habits are decided on by the patient with goals and time frames  set for achieving them. Immunization and cancer screening needs are specifically addressed at this visit.

## 2015-06-11 NOTE — Assessment & Plan Note (Signed)

## 2015-06-24 ENCOUNTER — Other Ambulatory Visit: Payer: Self-pay | Admitting: Family Medicine

## 2015-07-25 ENCOUNTER — Other Ambulatory Visit: Payer: Self-pay | Admitting: Family Medicine

## 2015-07-28 ENCOUNTER — Encounter (HOSPITAL_COMMUNITY): Payer: Self-pay | Admitting: Psychiatry

## 2015-07-28 ENCOUNTER — Ambulatory Visit (INDEPENDENT_AMBULATORY_CARE_PROVIDER_SITE_OTHER): Payer: BLUE CROSS/BLUE SHIELD | Admitting: Psychiatry

## 2015-07-28 VITALS — BP 160/86 | HR 84 | Ht 68.0 in | Wt 202.2 lb

## 2015-07-28 DIAGNOSIS — F322 Major depressive disorder, single episode, severe without psychotic features: Secondary | ICD-10-CM

## 2015-07-28 DIAGNOSIS — F4310 Post-traumatic stress disorder, unspecified: Secondary | ICD-10-CM

## 2015-07-28 MED ORDER — VENLAFAXINE HCL ER 150 MG PO CP24: 300.0000 mg | ORAL_CAPSULE | Freq: Every day | ORAL | Status: DC

## 2015-07-28 MED ORDER — TRAZODONE HCL 100 MG PO TABS: 100.0000 mg | ORAL_TABLET | Freq: Every day | ORAL | Status: DC

## 2015-07-28 MED ORDER — ALPRAZOLAM 1 MG PO TABS: 1.0000 mg | ORAL_TABLET | Freq: Every day | ORAL | Status: DC | PRN
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
## 2015-08-16 IMAGING — CR DG ANKLE COMPLETE 3+V*L*
3 series · 3 of 3 positions shown · non-contrast
Comparison: None.

CLINICAL DATA: Left ankle pain and swelling for 5 days

EXAM:
LEFT ANKLE COMPLETE - 3+ VIEW

[view not recorded (1 of 3)]
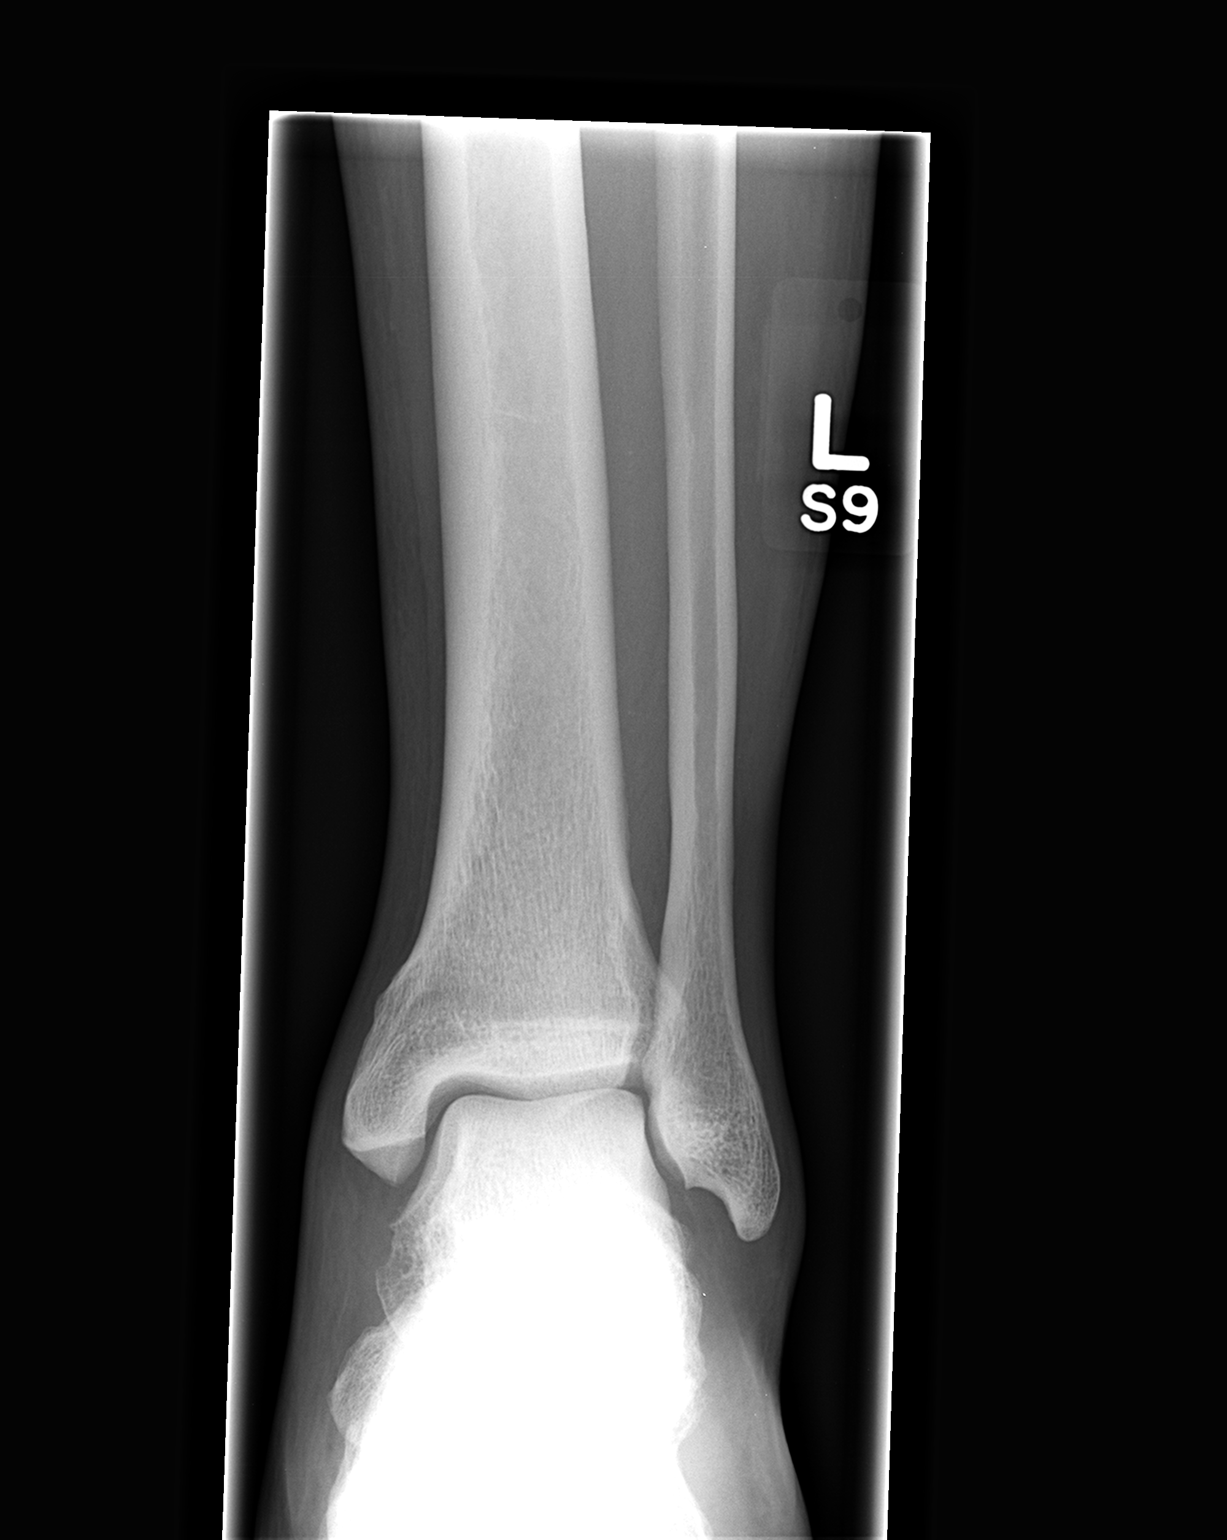

[view not recorded (2 of 3)]
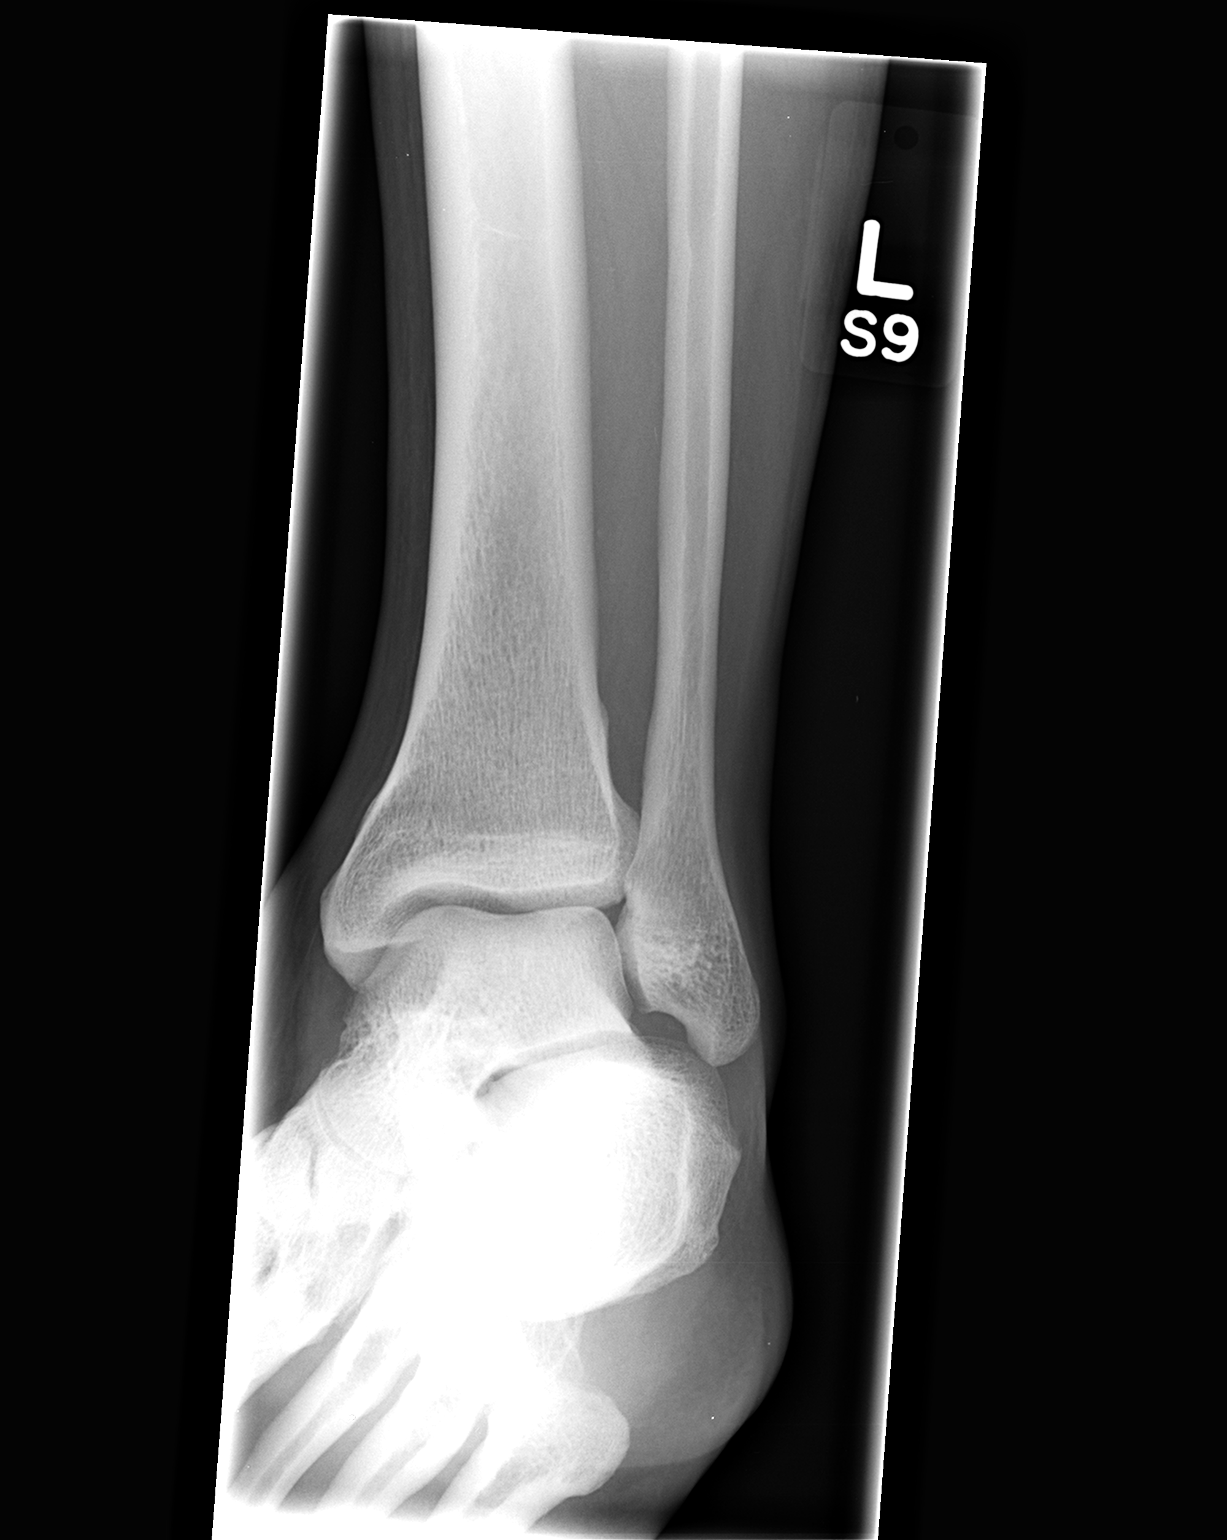

[view not recorded (3 of 3)]
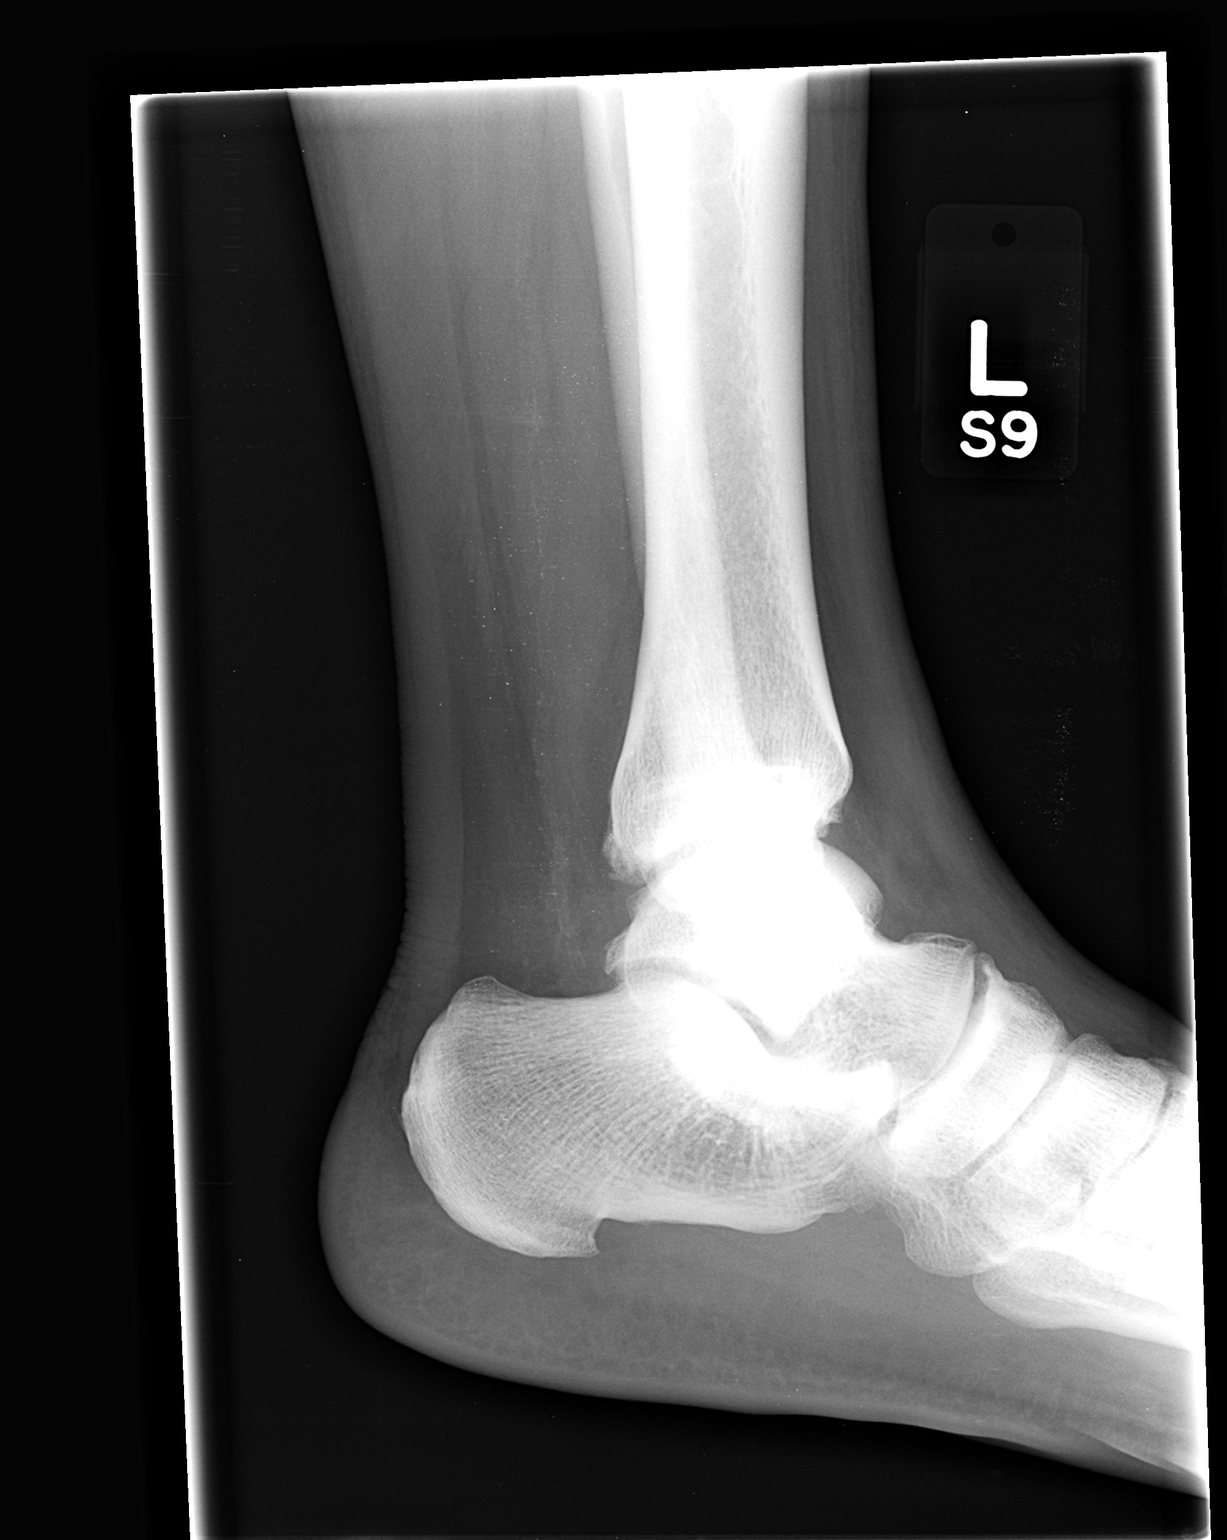

[3 of 3 positions shown; findings below may reference images not displayed]

FINDINGS: The ankle joint mortise is preserved. The talar dome is intact.
There is no acute or healing fracture. There are minimal
degenerative changes of the ankle. There is small plantar calcaneal
spur. The soft tissues are unremarkable.
IMPRESSION: There is no acute bony abnormality of the left ankle. Mild
degenerative changes are present.

## 2015-08-16 IMAGING — CR DG KNEE COMPLETE 4+V*L*
4 series · 4 of 4 positions shown · non-contrast
Comparison: None.

CLINICAL DATA: Atraumatic left knee pain and swelling

EXAM:
LEFT KNEE - COMPLETE 4+ VIEW

[view not recorded (1 of 4)]
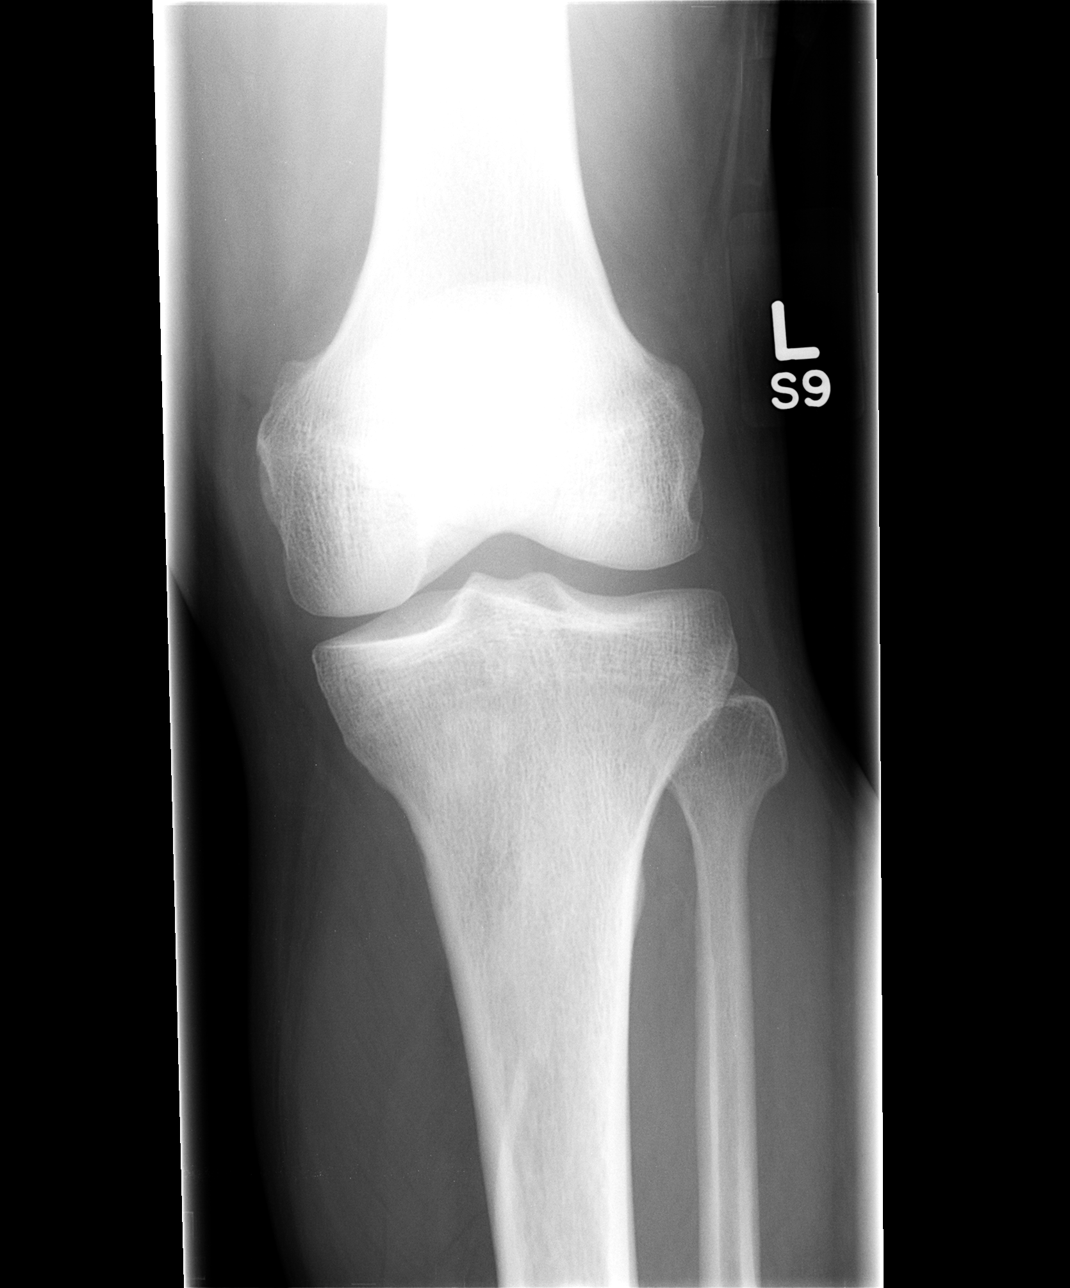

[view not recorded (2 of 4)]
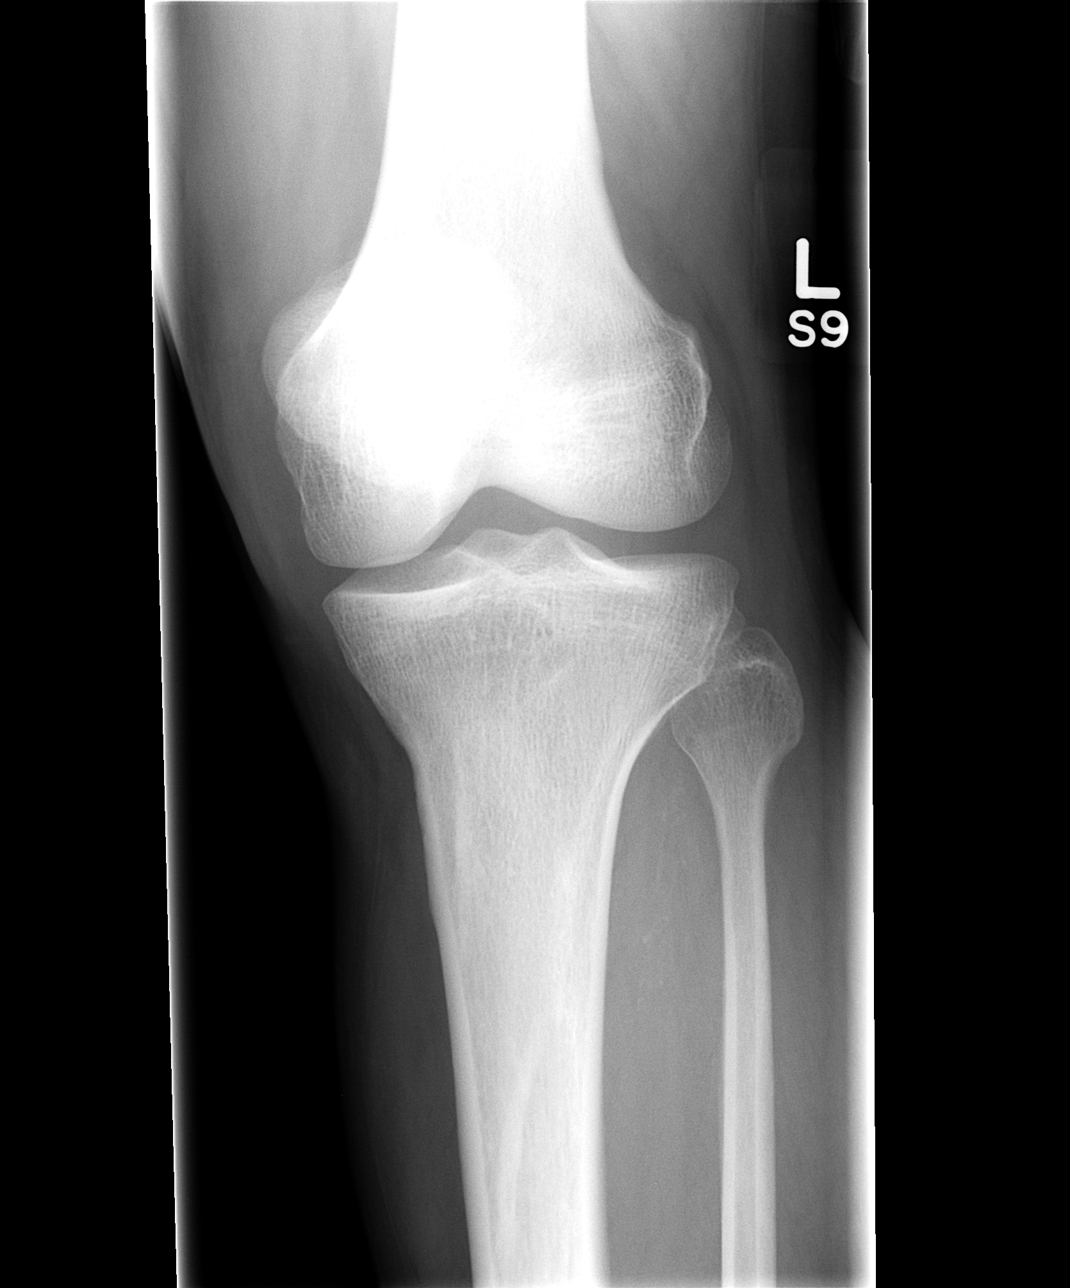

[view not recorded (3 of 4)]
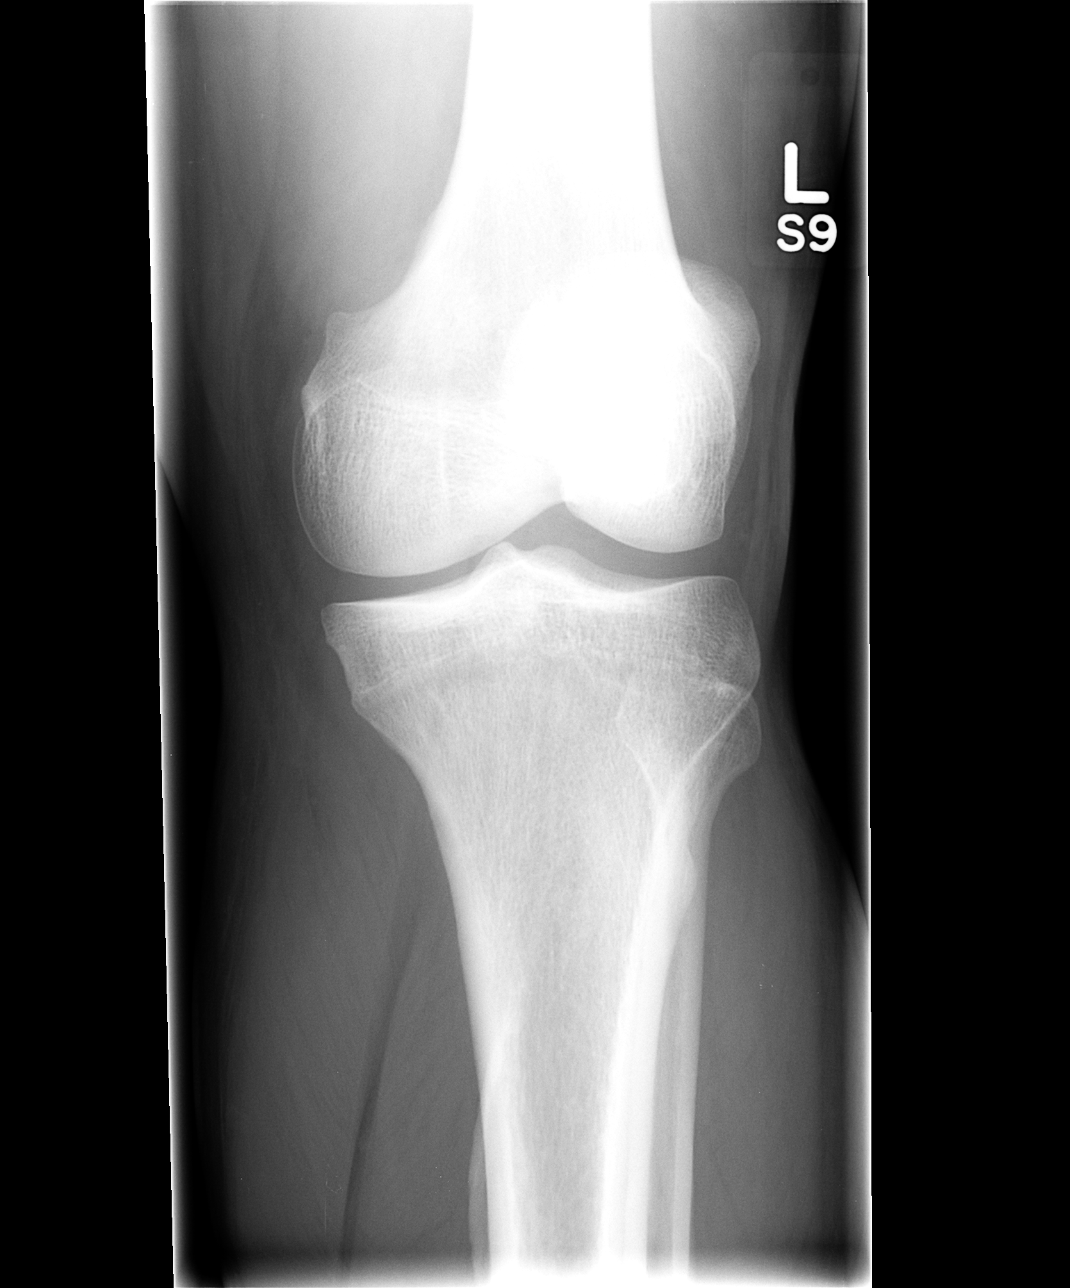

[view not recorded (4 of 4)]
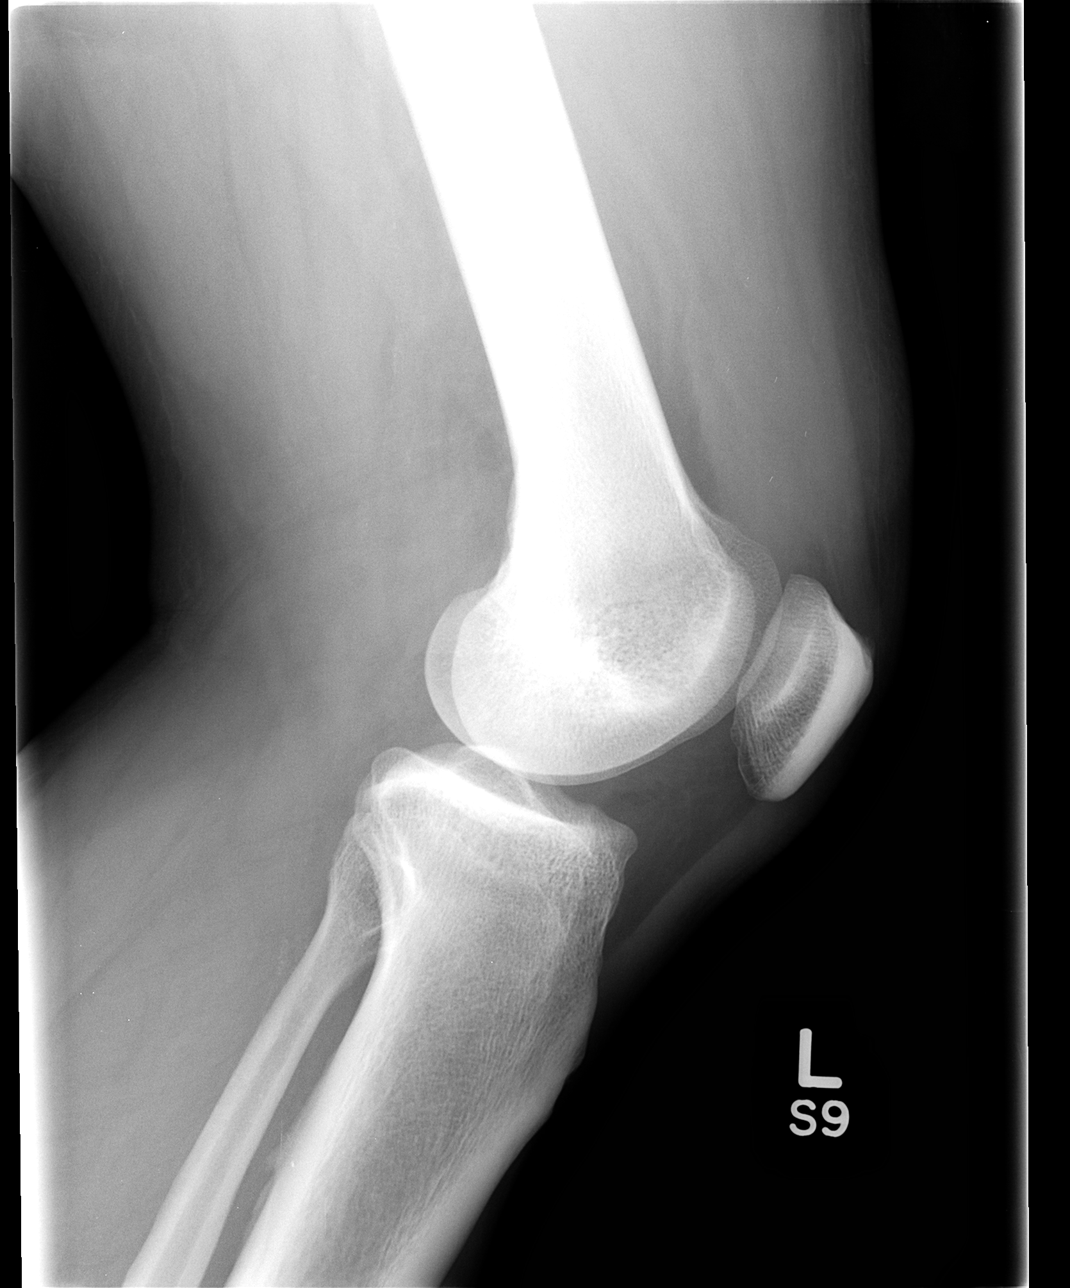

[4 of 4 positions shown; findings below may reference images not displayed]

FINDINGS: The bones of the left knee are adequately mineralized. There is no
acute or healing fracture. There is no dislocation. The soft tissues
are unremarkable.
IMPRESSION: There is no acute bony abnormality of the left knee.

## 2015-08-22 ENCOUNTER — Other Ambulatory Visit: Payer: Self-pay | Admitting: Family Medicine

## 2015-08-25 ENCOUNTER — Other Ambulatory Visit: Payer: Self-pay

## 2015-08-27 ENCOUNTER — Emergency Department (HOSPITAL_COMMUNITY)
Admission: EM | Admit: 2015-08-27 | Discharge: 2015-08-27 | Disposition: A | Discharge status: Home / Self Care | Payer: BLUE CROSS/BLUE SHIELD

## 2015-08-27 NOTE — ED Notes (Signed)
No answer when called to triage.

## 2015-08-28 ENCOUNTER — Telehealth: Payer: Self-pay

## 2015-08-28 NOTE — Telephone Encounter (Signed)
Patient came in requesting to be seen and c/o broken skin on penis which was uncircumsized and some swelling and trouble urinating. Advised ER and to call for a follow up visit next week if recommended

## 2015-08-30 ENCOUNTER — Emergency Department (HOSPITAL_COMMUNITY)
Admission: EM | Admit: 2015-08-30 | Discharge: 2015-08-30 | Disposition: A | Discharge status: Home / Self Care | Payer: BLUE CROSS/BLUE SHIELD | Attending: Emergency Medicine | Admitting: Emergency Medicine

## 2015-08-30 ENCOUNTER — Encounter (HOSPITAL_COMMUNITY): Payer: Self-pay | Admitting: Emergency Medicine

## 2015-08-30 DIAGNOSIS — Z7982 Long term (current) use of aspirin: Secondary | ICD-10-CM | POA: Diagnosis not present

## 2015-08-30 DIAGNOSIS — F329 Major depressive disorder, single episode, unspecified: Secondary | ICD-10-CM | POA: Insufficient documentation

## 2015-08-30 DIAGNOSIS — E785 Hyperlipidemia, unspecified: Secondary | ICD-10-CM | POA: Insufficient documentation

## 2015-08-30 DIAGNOSIS — Z87891 Personal history of nicotine dependence: Secondary | ICD-10-CM | POA: Insufficient documentation

## 2015-08-30 DIAGNOSIS — E119 Type 2 diabetes mellitus without complications: Secondary | ICD-10-CM | POA: Diagnosis not present

## 2015-08-30 DIAGNOSIS — I10 Essential (primary) hypertension: Secondary | ICD-10-CM | POA: Insufficient documentation

## 2015-08-30 DIAGNOSIS — Z79899 Other long term (current) drug therapy: Secondary | ICD-10-CM | POA: Diagnosis not present

## 2015-08-30 DIAGNOSIS — N472 Paraphimosis: Secondary | ICD-10-CM

## 2015-08-30 DIAGNOSIS — N5089 Other specified disorders of the male genital organs: Secondary | ICD-10-CM | POA: Diagnosis present

## 2015-08-30 DIAGNOSIS — N471 Phimosis: Secondary | ICD-10-CM | POA: Diagnosis not present

## 2015-08-30 LAB — CBC WITH DIFFERENTIAL/PLATELET
BASOS ABS: 0.1 10*3/uL (ref 0.0–0.1)
Basophils Relative: 1 %
Eosinophils Absolute: 0.5 10*3/uL (ref 0.0–0.7)
Eosinophils Relative: 8 %
HEMATOCRIT: 34.4 % — AB (ref 39.0–52.0)
Hemoglobin: 11.6 g/dL — ABNORMAL LOW (ref 13.0–17.0)
LYMPHS PCT: 38 %
Lymphs Abs: 2.4 10*3/uL (ref 0.7–4.0)
MCH: 30.8 pg (ref 26.0–34.0)
MCHC: 33.7 g/dL (ref 30.0–36.0)
MCV: 91.2 fL (ref 78.0–100.0)
MONO ABS: 0.4 10*3/uL (ref 0.1–1.0)
MONOS PCT: 6 %
NEUTROS ABS: 3 10*3/uL (ref 1.7–7.7)
Neutrophils Relative %: 47 %
Platelets: 250 10*3/uL (ref 150–400)
RBC: 3.77 MIL/uL — ABNORMAL LOW (ref 4.22–5.81)
RDW: 12.1 % (ref 11.5–15.5)
WBC: 6.4 10*3/uL (ref 4.0–10.5)

## 2015-08-30 LAB — URINALYSIS, ROUTINE W REFLEX MICROSCOPIC
Bilirubin Urine: NEGATIVE
Hgb urine dipstick: NEGATIVE
Ketones, ur: NEGATIVE mg/dL
LEUKOCYTES UA: NEGATIVE
Nitrite: NEGATIVE
PROTEIN: NEGATIVE mg/dL
SPECIFIC GRAVITY, URINE: 1.015 (ref 1.005–1.030)
pH: 6.5 (ref 5.0–8.0)

## 2015-08-30 LAB — COMPREHENSIVE METABOLIC PANEL
ALK PHOS: 97 U/L (ref 38–126)
ALT: 29 U/L (ref 17–63)
AST: 26 U/L (ref 15–41)
Albumin: 3.6 g/dL (ref 3.5–5.0)
Anion gap: 6 (ref 5–15)
BILIRUBIN TOTAL: 0.7 mg/dL (ref 0.3–1.2)
BUN: 13 mg/dL (ref 6–20)
CALCIUM: 8.8 mg/dL — AB (ref 8.9–10.3)
CO2: 29 mmol/L (ref 22–32)
Chloride: 99 mmol/L — ABNORMAL LOW (ref 101–111)
Creatinine, Ser: 1.22 mg/dL (ref 0.61–1.24)
GFR calc Af Amer: >60 mL/min (ref >60)
Glucose, Bld: 385 mg/dL — ABNORMAL HIGH (ref 65–99)
POTASSIUM: 3.9 mmol/L (ref 3.5–5.1)
Sodium: 134 mmol/L — ABNORMAL LOW (ref 135–145)
TOTAL PROTEIN: 6.9 g/dL (ref 6.5–8.1)

## 2015-08-30 LAB — URINE MICROSCOPIC-ADD ON
BACTERIA UA: NONE SEEN
SQUAMOUS EPITHELIAL / LPF: NONE SEEN
WBC, UA: NONE SEEN WBC/hpf (ref 0–5)

## 2015-08-30 NOTE — ED Notes (Signed)
Dr. Mesner at bedside   

## 2015-08-30 NOTE — Consult Note (Signed)
Urology Consult  Consulting MD: Carolynn Serve, M.D.  CC: Foreskin problems  HPI: This is a 55 year old male with diabetes, uncircumcised, who presented to the emergency room today with 2 week history of problems with his foreskin. The patient has been unable to replace his foreskin over his glans for approximate 2 weeks. He has had gradual swelling of his glands, with significant pain. He has slowing of his stream. For quite a while, he has had tightness of his distal foreskin, with tearing of his foreskin with erections. He is not currently sexually active, as his wife has cancer and is being treated for this. The patient presented for further management. He has had no fever, chills, he denies significant problems feeling like he is emptying. He denies long-standing history of balanitis.  The patient was seen and evaluated by Dr. Clayborne Dana, and I was consulted regarding management.  PMH: Past Medical History  Diagnosis Date  . Depression 2007  . Hyperlipidemia   . Hypertension   . Hypogonadism male   . Anxiety   . Diabetes mellitus without complication (HCC)     PSH: Past Surgical History  Procedure Laterality Date  . Hernia repair      ventral hernia repair   . Colonoscopy  01/20/2011    Procedure: COLONOSCOPY;  Surgeon: Arlyce Harman, MD;  Location: [facility];  Service: Endoscopy;  Laterality: N/A;    Allergies: No Known Allergies  Medications:  (Not in a hospital admission)   Social History: Social History   Social History  . Marital Status: Married    Spouse Name: N/A  . Number of Children: 1  . Years of Education: N/A   Occupational History  . Dispensing optician    Social History Main Topics  . Smoking status: Former Smoker -- 0.25 packs/day    Types: Cigarettes  . Smokeless tobacco: Not on file  . Alcohol Use: 0.6 oz/week    1 Cans of beer per week     Comment: occasional beer socially.  . Drug Use: No  . Sexual Activity: Yes   Other Topics Concern  . Not  on file   Social History Narrative    Family History: Family History  Problem Relation Age of Onset  . Heart failure Mother     living   . Hypertension Mother   . Colon cancer Neg Hx     Review of Systems: Positive: Penile swelling, pain, spreading of the stream Negative:  .  A further 10 point review of systems was negative except what is listed in the HPI.  Physical Exam: @ General: No acute distress.  Awake. Head:  Normocephalic.  Atraumatic. ENT:  EOMI.  Mucous membranes moist Neck:  Supple.  No lymphadenopathy. Pulmonary: Equal effort bilaterally.  Clear to auscultation bilaterally. Abdomen: Soft.  nontender to palpation. Skin:  Normal turgor.  No visible rash. Extremity: No gross deformity of bilateral upper extremities.  No gross deformity of bilateral lower extremities. Neurologic: Alert. Appropriate mood.  Penis:  Uncircumcised.  paraphimosis noted.. There appears to be maceration of his distal penile skin. There is no significant induration or crepitus.  Urethra:           Orthotopic meatus. Scrotum: No lesions.  No ecchymosis.  No erythema. Testicles: Descended bilaterally.  No masses bilaterally. Epididymis: Palpable bilaterally.  Non Tender to palpation.  Studies:  Recent Labs     08/30/15  0859  HGB  11.6*  WBC  6.4  PLT  250  Recent Labs     08/30/15  0859  NA  134*  K  3.9  CL  99*  CO2  29  BUN  13  CREATININE  1.22  CALCIUM  8.8*  GFRNONAA  >60  GFRAA  >60     No results for input(s): INR, APTT in the last 72 hours.  Invalid input(s): PT   Invalid input(s): ABG   with the patient's verbal consent, I applied significant circumferential pressure on the patient's distal penis, foreskin and glans for approximately 10 minutes.  There was then, for the most part, resolution of his glanular and preputial edema. I was then able to reduce his paraphimosis.   Assessment: Paraphimosis, reduced  1. I notified the patient noted  that he would need to come in to my office to schedule an eventual circumcision  2. He was taught to cleanse underneath his foreskin twice a day with peroxide and apply Neosporin-instructions will be given     [phone]

## 2015-08-30 NOTE — Discharge Instructions (Signed)
1. Cleanse underneath the foreskin thoroughly twice a day with a Q-tip soaked in peroxide  2. Apply Neosporin on a different Q-tip underneath the foreskin following cleansing  3. Call Dr. Lenoria Chimeahlstedt's office to set up an appropriate follow-up

## 2015-08-30 NOTE — ED Notes (Signed)
Specimen collection swabs placed at bedside for MD. Phlebotomy in room for lab draws.

## 2015-08-30 NOTE — ED Notes (Signed)
Pt reports edema and lesions on his penis. Pt states that he is uncircumcised. Pt also reports difficulty urinating. States that he has to pull back the foreskin to urinate, but is able to empty bladder.

## 2015-08-30 NOTE — ED Notes (Signed)
Urology at bedside.

## 2015-08-30 NOTE — ED Provider Notes (Signed)
CSN: [id number]     Arrival date & time 08/30/15  4098 History  By signing my name below, I, John [name], attest that this documentation has been prepared under the direction and in the presence of [name], MD. Electronically Signed: [name], Scribe. 08/30/2015. 9:22 AM.   Chief Complaint  Patient presents with  . Groin Swelling   The history is provided by the patient. No language interpreter was used.    HPI Comments: John [name] is a 55 y.o. male, with a hx of HTN and DM, who is uncircumcised, who presents to the Emergency Department complaining that the foreskin of his penis peeled back, and has remained peeled back, for the past three weeks, after he had an erection. He also notes associated, gradually worsening edema to the head of his penis as well as associated lesions, pain to the site, difficulty peeing, dysuria, and varied directionality of his urine stream when urinating. Pt states that his foreskin has peeled back before but was able to go back down on its own. He has tried applying neosporin with little to no relief. He denies fever, rash, and recent sexual activity.    Past Medical History  Diagnosis Date  . Depression 2007  . Hyperlipidemia   . Hypertension   . Hypogonadism male   . Anxiety   . Diabetes mellitus without complication [facility])    Past Surgical History  Procedure Laterality Date  . Hernia repair      ventral hernia repair   . Colonoscopy  01/20/2011    Procedure: COLONOSCOPY;  Surgeon: Arlyce Harman, MD;  Location: [facility];  Service: Endoscopy;  Laterality: N/A;   Family History  Problem Relation Age of Onset  . Heart failure Mother     living   . Hypertension Mother   . Colon cancer Neg Hx    Social History  Substance Use Topics  . Smoking status: Former Smoker -- 0.25 packs/day    Types: Cigarettes  . Smokeless tobacco: None  . Alcohol Use: 0.6 oz/week    1 Cans of beer per week     Comment: occasional beer  socially.    Review of Systems  Constitutional: Negative for fever.  Genitourinary: Positive for dysuria, penile swelling, difficulty urinating and penile pain.  Skin: Negative for rash.  All other systems reviewed and are negative.  Allergies  Review of patient's allergies indicates no known allergies.  Home Medications   Prior to Admission medications   Medication Sig Start Date End Date Taking? Authorizing Provider  ALPRAZolam Prudy Feeler) 1 MG tablet Take 1 tablet (1 mg total) by mouth daily as needed for anxiety. 07/28/15 07/27/16 Yes Myrlene Broker, MD  aspirin (RA ASPIRIN EC ADULT LOW ST) 81 MG EC tablet Take 1 tablet by mouth at bedtime. 09/18/13  Yes Kerri Perches, MD  benazepril (LOTENSIN) 10 MG tablet TAKE ONE TABLET BY MOUTH ONCE DAILY 08/24/15  Yes Kerri Perches, MD  glipiZIDE (GLUCOTROL) 5 MG tablet TAKE ONE TABLET BY MOUTH ONCE DAILY BEFORE BREAKFAST 08/24/15  Yes Kerri Perches, MD  lovastatin (MEVACOR) 40 MG tablet TAKE ONE TABLET BY MOUTH ONCE DAILY AT BEDTIME 06/25/15  Yes Kerri Perches, MD  temazepam (RESTORIL) 30 MG capsule TAKE ONE CAPSULE BY MOUTH ONCE DAILY AT BEDTIME AS NEEDED FOR SLEEP 06/25/15  Yes Kerri Perches, MD  traZODone (DESYREL) 100 MG tablet Take 1 tablet (100 mg total) by mouth at bedtime. 07/28/15  Yes  Myrlene Brokereborah R Ross, MD  triamterene-hydrochlorothiazide (MAXZIDE) 75-50 MG tablet TAKE ONE TABLET BY MOUTH ONCE DAILY 07/27/15  Yes Kerri PerchesMargaret E Simpson, MD  venlafaxine XR (EFFEXOR XR) 150 MG 24 hr capsule Take 2 capsules (300 mg total) by mouth daily with breakfast. 07/28/15  Yes Myrlene Brokereborah R Ross, MD  glucose blood test strip Once daily testing (one touch verio) Dx 250.00 01/31/14   Kerri PerchesMargaret E Simpson, MD  [facility] Inc[facility] DELICA LANCETS 33G MISC Once daily testing dx 250.00 01/31/14   Kerri PerchesMargaret E Simpson, MD   BP 166/89 mmHg  Pulse 78  Temp(Src) 98.7 F (37.1 C) (Oral)  Resp 16  Ht 5\' 8"  (1.727 m)  Wt 202 lb (91.627 kg)  BMI 30.72 kg/m2  SpO2 100% Physical Exam   Constitutional: He is oriented to person, place, and time. He appears well-developed and well-nourished. No distress.  HENT:  Head: Normocephalic and atraumatic.  Eyes: Conjunctivae are normal.  Cardiovascular: Normal rate, regular rhythm and normal heart sounds.   Pulmonary/Chest: Effort normal and breath sounds normal. No respiratory distress.  Abdominal: Soft. He exhibits no distension. There is no tenderness.  Genitourinary:  Chaperone (scribe) was present for exam which was performed with no discomfort or complications.  Uncircumcised with foreskin retracted but loose and mobile, however significant swelling of his glans and associated ulcerated lesions near the base of his glans. Pink, not black or white, normal temperature. Some discharge around urethra. No rashes in perineal or inguinal areas. No tenderness, no lymphadenopathy.  Neurological: He is alert and oriented to person, place, and time.  Skin: Skin is warm and dry.  Psychiatric: He has a normal mood and affect.  Nursing note and vitals reviewed.   ED Course  Procedures  DIAGNOSTIC STUDIES:    Oxygen Saturation is 100% on RA, normal by my interpretation.   COORDINATION OF CARE:  8:46 AM Will order blood work and urinalysis. Will consult with urology. Discussed treatment plan with pt at bedside and pt agreed to plan.   Labs Review Labs Reviewed  URINALYSIS, ROUTINE W REFLEX MICROSCOPIC (NOT [facility]) - Abnormal; Notable for the following:    Glucose, UA >1000 (*)    All other components within normal limits  CBC WITH DIFFERENTIAL/PLATELET - Abnormal; Notable for the following:    RBC 3.77 (*)    Hemoglobin 11.6 (*)    HCT 34.4 (*)    All other components within normal limits  COMPREHENSIVE METABOLIC PANEL - Abnormal; Notable for the following:    Sodium 134 (*)    Chloride 99 (*)    Glucose, Bld 385 (*)    Calcium 8.8 (*)    All other components within normal limits  HERPES SIMPLEX VIRUS CULTURE  URINE  MICROSCOPIC-ADD ON  RPR  HIV ANTIBODY (ROUTINE TESTING)  GC/CHLAMYDIA PROBE AMP ([place]) NOT [facility]   I have personally reviewed and evaluated these lab results as part of my medical decision-making.  MDM   Final diagnoses:  Paraphimosis    2 weeks of progressively worsening paraphimosis. D/W urology who saw patient and reduced at bedside and will see patient in follow up. H/o DM, cbg's elevated. Std swabs sent.   I personally performed the services described in this documentation, which was scribed in my presence. The recorded information has been reviewed and is accurate.      Marily MemosJason Karrine Kluttz, MD 08/30/15 1531

## 2015-08-31 LAB — HIV ANTIBODY (ROUTINE TESTING W REFLEX): HIV SCREEN 4TH GENERATION: NONREACTIVE

## 2015-08-31 LAB — GC/CHLAMYDIA PROBE AMP (~~LOC~~) NOT AT ARMC
CHLAMYDIA, DNA PROBE: NEGATIVE
Neisseria Gonorrhea: NEGATIVE

## 2015-08-31 LAB — RPR: RPR: NONREACTIVE

## 2015-09-02 LAB — HERPES SIMPLEX VIRUS CULTURE: Culture: DETECTED

## 2015-09-03 ENCOUNTER — Telehealth: Payer: Self-pay | Admitting: *Deleted

## 2015-09-03 NOTE — ED Notes (Signed)
(+)  herpes virus, reviewed by Alveta HeimlichStevi Barrett, PA.  Valcyclovir 1gm PO BID x 10 days [facility] [place] [phone].  Patient notified and verbalized understanding of test results and prescription.

## 2015-09-03 NOTE — Progress Notes (Signed)
ED Antimicrobial Stewardship Positive Culture Follow Up   John [name] is an 55 y.o. male who presented to [facility] on 08/30/2015 with a chief complaint of  Chief Complaint  Patient presents with  . Groin Swelling    Recent Results (from the past 720 hour(s))  Herpes simplex virus culture     Status: None   Collection Time: 08/30/15  9:43 AM  Result Value Ref Range Status   Specimen Description LESION PENIS  Final   Special Requests NONE  Final   Culture   Final    Herpes Simplex Type 2 detected. Performed at [facility]    Report Status 09/02/2015 FINAL  Final    [x]  Patient discharged originally without antimicrobial agent and treatment is now indicated   New antibiotic prescription: valacyclovir 1 gram BID x 10 days. Follow-up with PCP.   ED Provider: Alveta HeimlichStevi Barrett, PA-C   Nayleah Gamel A Tilford Deaton 09/03/2015, 9:57 AM Infectious Diseases Pharmacist Phone# [phone]

## 2015-10-23 ENCOUNTER — Ambulatory Visit (INDEPENDENT_AMBULATORY_CARE_PROVIDER_SITE_OTHER): Payer: BLUE CROSS/BLUE SHIELD | Admitting: Psychiatry

## 2015-10-23 ENCOUNTER — Encounter (HOSPITAL_COMMUNITY): Payer: Self-pay | Admitting: Psychiatry

## 2015-10-23 VITALS — BP 180/90 | Ht 68.0 in | Wt 200.0 lb

## 2015-10-23 DIAGNOSIS — F4310 Post-traumatic stress disorder, unspecified: Secondary | ICD-10-CM | POA: Diagnosis not present

## 2015-10-23 DIAGNOSIS — F322 Major depressive disorder, single episode, severe without psychotic features: Secondary | ICD-10-CM | POA: Diagnosis not present

## 2015-10-23 MED ORDER — TEMAZEPAM 30 MG PO CAPS: 30.0000 mg | ORAL_CAPSULE | Freq: Every day | ORAL | Status: DC

## 2015-10-23 MED ORDER — VENLAFAXINE HCL ER 150 MG PO CP24: 300.0000 mg | ORAL_CAPSULE | Freq: Every day | ORAL | Status: DC

## 2015-10-23 MED ORDER — ALPRAZOLAM 1 MG PO TABS: 1.0000 mg | ORAL_TABLET | Freq: Every day | ORAL | Status: DC | PRN

## 2015-10-23 MED ORDER — TRAZODONE HCL 100 MG PO TABS: 100.0000 mg | ORAL_TABLET | Freq: Every day | ORAL | Status: DC
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
## 2015-10-24 LAB — LIPID PANEL
CHOL/HDL RATIO: 7.3 ratio — AB (ref <=5.0)
CHOLESTEROL: 299 mg/dL — AB (ref 125–200)
HDL: 41 mg/dL (ref >=40)
LDL Cholesterol: 213 mg/dL — ABNORMAL HIGH (ref <130)
TRIGLYCERIDES: 224 mg/dL — AB (ref <150)
VLDL: 45 mg/dL — AB (ref <30)

## 2015-10-24 LAB — COMPLETE METABOLIC PANEL WITH GFR
ALK PHOS: 96 U/L (ref 40–115)
ALT: 41 U/L (ref 9–46)
AST: 30 U/L (ref 10–35)
Albumin: 4.2 g/dL (ref 3.6–5.1)
BUN: 15 mg/dL (ref 7–25)
CALCIUM: 9.8 mg/dL (ref 8.6–10.3)
CHLORIDE: 92 mmol/L — AB (ref 98–110)
CO2: 32 mmol/L — ABNORMAL HIGH (ref 20–31)
Creat: 1.25 mg/dL (ref 0.70–1.33)
GFR, EST AFRICAN AMERICAN: 74 mL/min (ref >=60)
GFR, EST NON AFRICAN AMERICAN: 64 mL/min (ref >=60)
Glucose, Bld: 337 mg/dL — ABNORMAL HIGH (ref 65–99)
POTASSIUM: 4.1 mmol/L (ref 3.5–5.3)
Sodium: 131 mmol/L — ABNORMAL LOW (ref 135–146)
Total Bilirubin: 0.6 mg/dL (ref 0.2–1.2)
Total Protein: 7.3 g/dL (ref 6.1–8.1)

## 2015-10-24 LAB — HEMOGLOBIN A1C
HEMOGLOBIN A1C: 12.3 % — AB (ref <5.7)
Mean Plasma Glucose: 306 mg/dL

## 2015-10-24 LAB — PSA: PSA: 0.86 ng/mL (ref <=4.00)

## 2015-10-24 LAB — HIV ANTIBODY (ROUTINE TESTING W REFLEX): HIV: NONREACTIVE

## 2015-10-27 ENCOUNTER — Ambulatory Visit (INDEPENDENT_AMBULATORY_CARE_PROVIDER_SITE_OTHER): Payer: BLUE CROSS/BLUE SHIELD | Admitting: Urology

## 2015-10-27 DIAGNOSIS — N471 Phimosis: Secondary | ICD-10-CM | POA: Diagnosis not present

## 2015-11-04 ENCOUNTER — Telehealth: Payer: Self-pay | Admitting: Family Medicine

## 2015-11-04 NOTE — Telephone Encounter (Signed)
Pls review his results with him Uncontrolled blood sugar needs ADDITIONAL glipizide XL 10 mg ( total 15 daily) and also m metformin 500mg  one twice daily , b  For cholesterol other than changin gdiet I recommend lipitor 40 mg daily, and stop the lovastatin. All meds entered, pls send after you spk with him  ??/ pls ask

## 2015-11-04 NOTE — Telephone Encounter (Signed)
Called patient and left message for them to return call at the office   

## 2015-11-06 ENCOUNTER — Other Ambulatory Visit: Payer: Self-pay

## 2015-11-06 MED ORDER — GLIPIZIDE ER 10 MG PO TB24: ORAL_TABLET | ORAL | Status: DC

## 2015-11-06 MED ORDER — ATORVASTATIN CALCIUM 40 MG PO TABS: 40.0000 mg | ORAL_TABLET | Freq: Every day | ORAL | Status: DC

## 2015-11-06 NOTE — Telephone Encounter (Signed)
Pt aware and meds sent in  

## 2015-11-09 ENCOUNTER — Encounter: Payer: Self-pay | Admitting: Family Medicine

## 2015-11-09 ENCOUNTER — Ambulatory Visit (INDEPENDENT_AMBULATORY_CARE_PROVIDER_SITE_OTHER): Payer: BLUE CROSS/BLUE SHIELD | Admitting: Family Medicine

## 2015-11-09 VITALS — BP 130/80 | HR 90 | Resp 16 | Ht 68.0 in | Wt 206.4 lb

## 2015-11-09 DIAGNOSIS — E669 Obesity, unspecified: Secondary | ICD-10-CM

## 2015-11-09 DIAGNOSIS — E119 Type 2 diabetes mellitus without complications: Secondary | ICD-10-CM

## 2015-11-09 DIAGNOSIS — E1169 Type 2 diabetes mellitus with other specified complication: Secondary | ICD-10-CM

## 2015-11-09 DIAGNOSIS — E663 Overweight: Secondary | ICD-10-CM

## 2015-11-09 DIAGNOSIS — F418 Other specified anxiety disorders: Secondary | ICD-10-CM | POA: Diagnosis not present

## 2015-11-09 DIAGNOSIS — E785 Hyperlipidemia, unspecified: Secondary | ICD-10-CM | POA: Diagnosis not present

## 2015-11-09 DIAGNOSIS — I10 Essential (primary) hypertension: Secondary | ICD-10-CM | POA: Diagnosis not present

## 2015-11-09 LAB — GLUCOSE, POCT (MANUAL RESULT ENTRY): POC Glucose: 472 mg/dl — AB (ref 70–99)

## 2015-11-09 MED ORDER — ACCU-CHEK FASTCLIX LANCETS MISC: Status: DC

## 2015-11-09 MED ORDER — GLIPIZIDE ER 10 MG PO TB24: ORAL_TABLET | ORAL | Status: DC

## 2015-11-09 MED ORDER — GLUCOSE BLOOD VI STRP: ORAL_STRIP | Status: DC

## 2015-11-09 MED ORDER — INSULIN ASPART 100 UNIT/ML ~~LOC~~ SOLN
7.0000 [IU] | Freq: Once | SUBCUTANEOUS | Status: AC
Start: 2015-11-09 — End: 2015-11-09
  Administered 2015-11-09: 7 [IU] via SUBCUTANEOUS

## 2015-11-09 NOTE — Patient Instructions (Addendum)
F/u 2nd week in August, call if you need me sooner  RE [facility] meds, he is to be on both metformin and glipizide at higher dose, glipizide ER 10 mg TWO daily at breakfast, and metformin 500 mg one twice daily  Give 7 units regular insulin  Fasting lipid, cmp and eGFr, hBA1C Agugust 6 or after  Microalb today  Please work on good  health habits so that your health will improve. 1. Commitment to daily physical activity for 30 to 60  minutes, if you are able to do this.  2. Commitment to wise food choices. Aim for half of your  food intake to be vegetable and fruit, one quarter starchy foods, and one quarter protein. Try to eat on a regular schedule  3 meals per day, snacking between meals should be limited to vegetables or fruits or small portions of nuts. 64 ounces of water per day is generally recommended, unless you have specific health conditions, like heart failure or kidney failure where you will need to limit fluid intake.  3. Commitment to sufficient and a  good quality of physical and mental rest daily, generally between 6 to 8 hours per day.  WITH PERSISTANCE AND PERSEVERANCE, THE IMPOSSIBLE , BECOMES THE NORM! Thanks for choosing [facility], we consider it a privelige to serve you.

## 2015-11-09 NOTE — Progress Notes (Signed)
Subjective:    Patient ID: John [name], male    DOB: 1961-04-02, 55 y.o.   MRN: [id number]  HPI   John [name]     MRN: [id number]      DOB: 30-Apr-1961   HPI John [name] is here for follow up and re-evaluation of chronic medical conditions, medication management and review of any available recent lab and radiology data. In particular , recent lab work showed marked deterioration in blood sugar control, he is still not on new and correct med ose, has not been testing, no supplies, an dc/o fatigue, polyuria and polydipsia Preventive health is updated, specifically  Cancer screening and Immunization.   Questions or concerns regarding consultations or procedures which the PT has had in the interim are  addressed. The PT denies any adverse reactions to current medications since the last visit. Not talking new dose of diabetic med unfortunately ROS Denies recent fever or chills.c/o fatigue Denies sinus pressure, nasal congestion, ear pain or sore throat. Denies chest congestion, productive cough or wheezing. Denies chest pains, palpitations and leg swelling Denies abdominal pain, nausea, vomiting,diarrhea or constipation.    Denies joint pain, swelling and limitation in mobility. Denies headaches, seizures, numbness, or tingling. improved depression, anxiety or insomnia. Denies skin break down or rash.   PE  BP 130/80 mmHg  Pulse 90  Resp 16  Ht  (1.727 m)  Wt 206 lb 6.4 oz (93.622 kg)  BMI 31.39 kg/m2  SpO2 97%  Patient alert and oriented and in no cardiopulmonary distress.  HEENT: No facial asymmetry, EOMI,   oropharynx pink and moist.  Neck supple no JVD, no mass.  Chest: Clear to auscultation bilaterally.  CVS: S1, S2 no murmurs, no S3.Regular rate.  ABD: Soft non tender.   Ext: No edema  MS: Adequate ROM spine, shoulders, hips and knees.  Skin: Intact, no ulcerations or rash noted.  Psych: Good eye contact, normal affect. Memory intact not anxious or  depressed appearing.  CNS: CN 2-12 intact, power,  normal throughout.no focal deficits noted.   Assessment & Plan   Diabetes mellitus type 2 in obese (HCC) Uncontrolled, increase to glipizide to 10 mg two daily, and add metformin twice daily, 500 mg, which he should have been taking  7 units insulin in office Needs to purchase supplies and test at least once daily, call with questions Updated lab needed at/ before next visit. Re educated about diabetes in terms of diet and needing to test at visit and guidelines   Essential hypertension Controlled, no change in medication DASH diet and commitment to daily physical activity for a minimum of 30 minutes discussed and encouraged, as a part of hypertension management. The importance of attaining a healthy weight is also discussed.  BP/Weight 11/09/2015 08/30/2015 06/11/2015 01/19/2015 09/18/2014 08/07/2014 07/24/2014  Systolic BP 130 166 120 108 122 134 122  Diastolic BP 80 89 82 70 82 80 78  Wt. (Lbs) 206.4 202 202 202 196 198 196  BMI 31.39 30.72 30.72 30.72 29.81 30.11 29.81  Some encounter information is confidential and restricted. Go to Review Flowsheets activity to see all data.        Hyperlipidemia LDL goal <100 Hyperlipidemia:Low fat diet discussed and encouraged.   Lipid Panel  Lab Results  Component Value Date   CHOL 299* 10/23/2015   HDL 41 10/23/2015   LDLCALC 213* 10/23/2015   TRIG 224* 10/23/2015   CHOLHDL 7.3* 10/23/2015      On new higher  dose of med Updated lab needed at/ before next visit.   Depression with anxiety Improving and being treated by psychiatry  Overweight (BMI 25.0-29.9) Deteriorated. Patient re-educated about  the importance of commitment to a  minimum of 150 minutes of exercise per week.  The importance of healthy food choices with portion control discussed. Encouraged to start a food diary, count calories and to consider  joining a support group. Sample diet sheets offered. Goals  set by the patient for the next several months.   Weight /BMI 11/09/2015 08/30/2015 06/11/2015  WEIGHT 206 lb 6.4 oz 202 lb 202 lb  HEIGHT 5\' 8"  5\' 8"  5\' 8"   BMI 31.39 kg/m2 30.72 kg/m2 30.72 kg/m2  Some encounter information is confidential and restricted. Go to Review Flowsheets activity to see all data.    Current exercise per week 90 minutes.        Review of Systems     Objective:   Physical Exam        Assessment & Plan:

## 2015-11-09 NOTE — Assessment & Plan Note (Signed)
Deteriorated. Patient re-educated about  the importance of commitment to a  minimum of 150 minutes of exercise per week.  The importance of healthy food choices with portion control discussed. Encouraged to start a food diary, count calories and to consider  joining a support group. Sample diet sheets offered. Goals set by the patient for the next several months.   Weight /BMI 11/09/2015 08/30/2015 06/11/2015  WEIGHT 206 lb 6.4 oz 202 lb 202 lb  HEIGHT 5\' 8"  5\' 8"  5\' 8"   BMI 31.39 kg/m2 30.72 kg/m2 30.72 kg/m2  Some encounter information is confidential and restricted. Go to Review Flowsheets activity to see all data.    Current exercise per week 90 minutes.

## 2015-11-09 NOTE — Assessment & Plan Note (Addendum)
Uncontrolled, increase to glipizide to 10 mg two daily, and add metformin twice daily, 500 mg, which he should have been taking  7 units insulin in office Needs to purchase supplies and test at least once daily, call with questions Updated lab needed at/ before next visit. Re educated about diabetes in terms of diet and needing to test at visit and guidelines

## 2015-11-09 NOTE — Assessment & Plan Note (Signed)
Improving and being treated by psychiatry

## 2015-11-09 NOTE — Assessment & Plan Note (Signed)
Hyperlipidemia:Low fat diet discussed and encouraged.   Lipid Panel  Lab Results  Component Value Date   CHOL 299* 10/23/2015   HDL 41 10/23/2015   LDLCALC 213* 10/23/2015   TRIG 224* 10/23/2015   CHOLHDL 7.3* 10/23/2015      On new higher dose of med Updated lab needed at/ before next visit.

## 2015-11-09 NOTE — Assessment & Plan Note (Signed)
Controlled, no change in medication DASH diet and commitment to daily physical activity for a minimum of 30 minutes discussed and encouraged, as a part of hypertension management. The importance of attaining a healthy weight is also discussed.  BP/Weight 11/09/2015 08/30/2015 06/11/2015 01/19/2015 09/18/2014 08/07/2014 07/24/2014  Systolic BP 130 166 120 108 122 134 122  Diastolic BP 80 89 82 70 82 80 78  Wt. (Lbs) 206.4 202 202 202 196 198 196  BMI 31.39 30.72 30.72 30.72 29.81 30.11 29.81  Some encounter information is confidential and restricted. Go to Review Flowsheets activity to see all data.

## 2015-11-10 LAB — MICROALBUMIN / CREATININE URINE RATIO
CREATININE, URINE: 58 mg/dL (ref 20–370)
MICROALB/CREAT RATIO: 12 ug/mg{creat} (ref <30)
Microalb, Ur: 0.7 mg/dL

## 2015-11-23 ENCOUNTER — Other Ambulatory Visit: Payer: Self-pay

## 2015-11-23 DIAGNOSIS — N471 Phimosis: Secondary | ICD-10-CM

## 2015-11-25 ENCOUNTER — Telehealth: Payer: Self-pay | Admitting: Urology

## 2015-11-25 NOTE — Telephone Encounter (Signed)
Pt advised of pre op date/time and sx date. Pre op: 12/31/15 @ 9:00am Sx: 01/05/16 with Dr Retta Dionesahlstedt @ [facility]. Post op: 02/02/16 @ 3:30pm  No authorization is required for CPT: 54161 per Teryl LucyJacob T with [employer]. REF# [id number]. [phone])

## 2015-12-28 NOTE — Patient Instructions (Signed)
John RoundGary K [name]  12/28/2015     @PREFPERIOPPHARMACY @   Your procedure is scheduled on 01/05/2016  [name].  Call this number if you have problems the morning of surgery:  [phone]   Remember:  Do not eat food or drink liquids after midnight.  Take these medicines the morning of surgery with A SIP OF WATER Lotensin, Effexor  DO NOT TAKE MEDICATION FOR DIABETES THE MORNING OF SURGERY   Do not wear jewelry, make-up or nail polish.  Do not wear lotions, powders, or perfumes.  You may wear deoderant.  Do not shave 48 hours prior to surgery.  Men may shave face and neck.  Do not bring valuables to the hospital.  [facility] is not responsible for any belongings or valuables.  Contacts, dentures or bridgework may not be worn into surgery.  Leave your suitcase in the car.  After surgery it may be brought to your room.  For patients admitted to the hospital, discharge time will be determined by your treatment team.  Patients discharged the day of surgery will not be allowed to drive home.    Please read over the following fact sheets that you were given. Surgical Site Infection Prevention and Anesthesia Post-op Instructions     PATIENT INSTRUCTIONS POST-ANESTHESIA  IMMEDIATELY FOLLOWING SURGERY:  Do not drive or operate machinery for the first twenty four hours after surgery.  Do not make any important decisions for twenty four hours after surgery or while taking narcotic pain medications or sedatives.  If you develop intractable nausea and vomiting or a severe headache please notify your doctor immediately.  FOLLOW-UP:  Please make an appointment with your surgeon as instructed. You do not need to follow up with anesthesia unless specifically instructed to do so.  WOUND CARE INSTRUCTIONS (if applicable):  Keep a dry clean dressing on the anesthesia/puncture wound site if there is drainage.  Once the wound has quit draining you may leave it open to air.   Generally you should leave the bandage intact for twenty four hours unless there is drainage.  If the epidural site drains for more than 36-48 hours please call the anesthesia department.  QUESTIONS?:  Please feel free to call your physician or the hospital operator if you have any questions, and they will be happy to assist you.      Circumcision, Adult Circumcision is a surgery to remove the foreskin of the penis or to cut the foreskin so the opening is larger. When only the foreskin is cut, it is called a dorsal (on top of the foreskin) incision. This procedure leaves the entire foreskin but makes the end of the foreskin looser so it can be pulled back over the head of the penis.  LET [facility] KNOW ABOUT:  Any allergies you have.  All medicines you are taking, including vitamins, herbs, eye drops, creams, and over the counter medicines.  Previous problems you or members of your family have had with the use of anesthetics.  Any blood disorders you have.  Previous surgeries you have had.  Medical conditions you have. RISKS AND COMPLICATIONS  Generally, circumcision is a safe procedure. However, as with any procedure, problems can occur. Possible problems include:  Bleeding.  Infection.  Pain.  Urethral injury. The urethra is the opening on the end of the penis that carries your urine out.  Breaking open of the surgical wound can occur from an unwanted erection after surgery.  BEFORE THE PROCEDURE   Do not take aspirin or blood thinners for a week prior to surgery, or as your health care provider suggests.  Do not eat or drink anything after midnight the night before surgery, or as instructed.  Let your health care provider know if you develop a cold or other infection before surgery.  You should be present 1 hour before the procedure, or as directed.  Before the procedure, you will be washed and given a local anesthetic to make sure you feel no  pain. PROCEDURE  An anesthetic will be given. When an anesthetic that just numbs the area of the procedure (local anesthetic) is used, the anesthetic will be injected with a needle into the skin of your penis to numb the nerves of your foreskin. Your surgeon will remove the excess foreskin with a surgical knife. Absorbable sutures will be used to close the incision after the foreskin has been removed. The sutures will not need to be removed.  AFTER THE PROCEDURE A sterile dressing will be applied to the incision site. It may be difficult to keep the dressing in place. It is alright if the dressing comes off, especially at night. Air will help a scab to form which will eliminate the need for dressings during the day.   This information is not intended to replace advice given to you by your health care provider. Make sure you discuss any questions you have with your health care provider.   Document Released: 06/26/2007 Document Revised: 06/27/2014 Document Reviewed: 11/06/2012 Elsevier Interactive Patient Education [employer]

## 2015-12-31 ENCOUNTER — Encounter (HOSPITAL_COMMUNITY): Payer: Self-pay | Admitting: Cardiology

## 2015-12-31 ENCOUNTER — Other Ambulatory Visit: Payer: Self-pay | Admitting: Family Medicine

## 2015-12-31 ENCOUNTER — Encounter (HOSPITAL_COMMUNITY)
Admission: RE | Admit: 2015-12-31 | Discharge: 2015-12-31 | Disposition: A | Discharge status: Home / Self Care | Payer: BLUE CROSS/BLUE SHIELD | Source: Ambulatory Visit | Attending: Urology | Admitting: Urology

## 2015-12-31 ENCOUNTER — Encounter (HOSPITAL_COMMUNITY): Payer: Self-pay

## 2015-12-31 ENCOUNTER — Telehealth: Payer: Self-pay

## 2015-12-31 ENCOUNTER — Other Ambulatory Visit: Payer: Self-pay

## 2015-12-31 ENCOUNTER — Telehealth: Payer: Self-pay | Admitting: Family Medicine

## 2015-12-31 ENCOUNTER — Emergency Department (HOSPITAL_COMMUNITY)
Admission: EM | Admit: 2015-12-31 | Discharge: 2015-12-31 | Disposition: A | Discharge status: Home / Self Care | Payer: BLUE CROSS/BLUE SHIELD | Attending: Emergency Medicine | Admitting: Emergency Medicine

## 2015-12-31 DIAGNOSIS — Z7982 Long term (current) use of aspirin: Secondary | ICD-10-CM | POA: Diagnosis not present

## 2015-12-31 DIAGNOSIS — F329 Major depressive disorder, single episode, unspecified: Secondary | ICD-10-CM | POA: Insufficient documentation

## 2015-12-31 DIAGNOSIS — IMO0002 Reserved for concepts with insufficient information to code with codable children: Secondary | ICD-10-CM

## 2015-12-31 DIAGNOSIS — E1065 Type 1 diabetes mellitus with hyperglycemia: Secondary | ICD-10-CM | POA: Diagnosis not present

## 2015-12-31 DIAGNOSIS — Z7984 Long term (current) use of oral hypoglycemic drugs: Secondary | ICD-10-CM | POA: Insufficient documentation

## 2015-12-31 DIAGNOSIS — E108 Type 1 diabetes mellitus with unspecified complications: Principal | ICD-10-CM

## 2015-12-31 DIAGNOSIS — E669 Obesity, unspecified: Secondary | ICD-10-CM

## 2015-12-31 DIAGNOSIS — E785 Hyperlipidemia, unspecified: Secondary | ICD-10-CM | POA: Diagnosis not present

## 2015-12-31 DIAGNOSIS — E1169 Type 2 diabetes mellitus with other specified complication: Secondary | ICD-10-CM

## 2015-12-31 DIAGNOSIS — E1165 Type 2 diabetes mellitus with hyperglycemia: Secondary | ICD-10-CM | POA: Diagnosis present

## 2015-12-31 DIAGNOSIS — F17210 Nicotine dependence, cigarettes, uncomplicated: Secondary | ICD-10-CM | POA: Diagnosis not present

## 2015-12-31 DIAGNOSIS — Z79899 Other long term (current) drug therapy: Secondary | ICD-10-CM | POA: Diagnosis not present

## 2015-12-31 DIAGNOSIS — R739 Hyperglycemia, unspecified: Secondary | ICD-10-CM

## 2015-12-31 DIAGNOSIS — I10 Essential (primary) hypertension: Secondary | ICD-10-CM | POA: Insufficient documentation

## 2015-12-31 LAB — BASIC METABOLIC PANEL
Anion gap: 12 (ref 5–15)
BUN: 23 mg/dL — AB (ref 6–20)
CHLORIDE: 85 mmol/L — AB (ref 101–111)
CO2: 30 mmol/L (ref 22–32)
CREATININE: 1.34 mg/dL — AB (ref 0.61–1.24)
Calcium: 9.9 mg/dL (ref 8.9–10.3)
GFR calc Af Amer: >60 mL/min (ref >60)
GFR calc non Af Amer: 58 mL/min — ABNORMAL LOW (ref >60)
GLUCOSE: 528 mg/dL — AB (ref 65–99)
Potassium: 4.4 mmol/L (ref 3.5–5.1)
SODIUM: 127 mmol/L — AB (ref 135–145)

## 2015-12-31 LAB — CBG MONITORING, ED
GLUCOSE-CAPILLARY: 461 mg/dL — AB (ref 65–99)
GLUCOSE-CAPILLARY: 221 mg/dL — AB (ref 65–99)
Glucose-Capillary: >600 mg/dL (ref 65–99)
Glucose-Capillary: 334 mg/dL — ABNORMAL HIGH (ref 65–99)

## 2015-12-31 LAB — URINALYSIS, ROUTINE W REFLEX MICROSCOPIC
Bilirubin Urine: NEGATIVE
HGB URINE DIPSTICK: NEGATIVE
Ketones, ur: NEGATIVE mg/dL
Leukocytes, UA: NEGATIVE
Nitrite: NEGATIVE
PH: 6 (ref 5.0–8.0)
Protein, ur: NEGATIVE mg/dL

## 2015-12-31 LAB — CBC
HEMATOCRIT: 39.5 % (ref 39.0–52.0)
HEMOGLOBIN: 13.8 g/dL (ref 13.0–17.0)
MCH: 30.7 pg (ref 26.0–34.0)
MCHC: 34.9 g/dL (ref 30.0–36.0)
MCV: 88 fL (ref 78.0–100.0)
Platelets: 296 10*3/uL (ref 150–400)
RBC: 4.49 MIL/uL (ref 4.22–5.81)
RDW: 12 % (ref 11.5–15.5)
WBC: 7.3 10*3/uL (ref 4.0–10.5)

## 2015-12-31 LAB — URINE MICROSCOPIC-ADD ON
BACTERIA UA: NONE SEEN
RBC / HPF: NONE SEEN RBC/hpf (ref 0–5)
WBC UA: NONE SEEN WBC/hpf (ref 0–5)

## 2015-12-31 MED ORDER — SODIUM CHLORIDE 0.9 % IV BOLUS (SEPSIS)
1000.0000 mL | Freq: Once | INTRAVENOUS | Status: AC
  Administered 2015-12-31: 1000 mL via INTRAVENOUS

## 2015-12-31 MED ORDER — INSULIN ASPART 100 UNIT/ML ~~LOC~~ SOLN
10.0000 [IU] | Freq: Once | SUBCUTANEOUS | Status: AC
  Administered 2015-12-31: 10 [IU] via INTRAVENOUS
  Filled 2015-12-31: qty 1

## 2015-12-31 MED ORDER — METFORMIN HCL 500 MG PO TABS: 500.0000 mg | ORAL_TABLET | Freq: Two times a day (BID) | ORAL | Status: DC

## 2015-12-31 MED ORDER — GLIPIZIDE ER 10 MG PO TB24: ORAL_TABLET | ORAL | Status: DC

## 2015-12-31 NOTE — ED Provider Notes (Signed)
CSN: [id number]     Arrival date & time 12/31/15  1125 History  By signing my name below, I, Majel Homereyton Lee, attest that this documentation has been prepared under the direction and in the presence of Jacalyn LefevreJulie Diany Formosa, MD . Electronically Signed: Majel HomerPeyton Lee, Scribe. 12/31/2015. 11:47 AM.  Chief Complaint  Patient presents with  . Hyperglycemia   The history is provided by the patient. No language interpreter was used.   HPI Comments: John [name] is a 55 y.o. male with PMHx of HTN, HLD, and DM, who presents to the Emergency Department complaining of gradually worsening, hyperglycemia that began today. He states associated thirstiness and blurry vision that he notes has been going on for a while now. Pt reports he was seen earlier today for pre-op for circumcision surgery but was told his blood sugar was too high. Pt notes his blood sugar is currently 548 in the ED. Pt states he has not taken his medication yet today as he was preparing for his blood work; though, he states he has been consistently taking his medication. He denies chest pain and shortness of breath. Pt states it has been a while since he has seen his eye doctor due to inability to pay co-payments.  According to pt's last PCP visit, he is supposed to be on glucotrol 10 mg 2 tablets every morning and metformin daily.  The pt is only actually taking glucotrol 5 mg daily.  He said that after his dr gave him samples, he called back and the glucotrol 5 is what they told him to take, and he showed me his bottle.  Pt said he's never heard of metformin.    Past Medical History  Diagnosis Date  . Depression 2007  . Hyperlipidemia   . Hypertension   . Hypogonadism male   . Anxiety   . Diabetes mellitus without complication [facility](HCC)    Past Surgical History  Procedure Laterality Date  . Hernia repair      ventral hernia repair   . Colonoscopy  01/20/2011    Procedure: COLONOSCOPY;  Surgeon: Arlyce HarmanSandi M Fields, MD;  Location: [facility];  Service:  Endoscopy;  Laterality: N/A;   Family History  Problem Relation Age of Onset  . Heart failure Mother     living   . Hypertension Mother   . Colon cancer Neg Hx    Social History  Substance Use Topics  . Smoking status: Current Every Day Smoker    Types: Cigarettes  . Smokeless tobacco: None  . Alcohol Use: 0.6 oz/week    1 Cans of beer per week     Comment: occasional beer socially.    Review of Systems  Eyes: Positive for visual disturbance.  Respiratory: Negative for shortness of breath.   Cardiovascular: Negative for chest pain.   Allergies  Review of patient's allergies indicates no known allergies.  Home Medications   Prior to Admission medications   Medication Sig Start Date End Date Taking? Authorizing Provider  ALPRAZolam Prudy Feeler(XANAX) 1 MG tablet Take 1 tablet (1 mg total) by mouth daily as needed for anxiety. 10/23/15 10/22/16 Yes Myrlene Brokereborah R Ross, MD  aspirin (RA ASPIRIN EC ADULT LOW ST) 81 MG EC tablet Take 1 tablet by mouth at bedtime. 09/18/13  Yes Kerri PerchesMargaret E Simpson, MD  atorvastatin (LIPITOR) 40 MG tablet Take 1 tablet (40 mg total) by mouth daily. 11/06/15  Yes Kerri PerchesMargaret E Simpson, MD  benazepril (LOTENSIN) 10 MG tablet TAKE ONE TABLET BY MOUTH ONCE DAILY  08/24/15  Yes Kerri Perches, MD  temazepam (RESTORIL) 30 MG capsule Take 1 capsule (30 mg total) by mouth at bedtime. 10/23/15  Yes Myrlene Broker, MD  traZODone (DESYREL) 100 MG tablet Take 1 tablet (100 mg total) by mouth at bedtime. 10/23/15  Yes Myrlene Broker, MD  triamterene-hydrochlorothiazide (MAXZIDE) 75-50 MG tablet TAKE ONE TABLET BY MOUTH ONCE DAILY 07/27/15  Yes Kerri Perches, MD  venlafaxine XR (EFFEXOR XR) 150 MG 24 hr capsule Take 2 capsules (300 mg total) by mouth daily with breakfast. 10/23/15  Yes Myrlene Broker, MD  glipiZIDE (GLUCOTROL XL) 10 MG 24 hr tablet Two tablets every morning at breakfast 12/31/15   Jacalyn Lefevre, MD  metFORMIN (GLUCOPHAGE) 500 MG tablet Take 1 tablet (500 mg total) by mouth 2  (two) times daily with a meal. Reported on 11/09/2015 12/31/15   Jacalyn Lefevre, MD   BP 160/89 mmHg  Pulse 101  Temp(Src) 98.3 F (36.8 C) (Oral)  Resp 20  Ht 5\' 8"  (1.727 m)  Wt 196 lb (88.905 kg)  BMI 29.81 kg/m2  SpO2 98% Physical Exam  Constitutional: He is oriented to person, place, and time. He appears well-developed and well-nourished.  HENT:  Head: Normocephalic and atraumatic.  Eyes: Conjunctivae are normal. Pupils are equal, round, and reactive to light. Right eye exhibits no discharge. Left eye exhibits no discharge. No scleral icterus.  Neck: Normal range of motion. No JVD present. No tracheal deviation present.  Pulmonary/Chest: Effort normal. No stridor.  Neurological: He is alert and oriented to person, place, and time. Coordination normal.  Psychiatric: He has a normal mood and affect. His behavior is normal. Judgment and thought content normal.  Nursing note and vitals reviewed.   ED Course  Procedures  DIAGNOSTIC STUDIES:  Oxygen Saturation is 98% on RA, normal by my interpretation.    COORDINATION OF CARE:  11:47 AM Discussed treatment plan, which includes IV fluids and insulin with pt at bedside and pt agreed to plan.  Labs Review Labs Reviewed  URINALYSIS, ROUTINE W REFLEX MICROSCOPIC (NOT AT [facility]) - Abnormal; Notable for the following:    Color, Urine STRAW (*)    Specific Gravity, Urine <1.005 (*)    Glucose, UA >1000 (*)    All other components within normal limits  URINE MICROSCOPIC-ADD ON - Abnormal; Notable for the following:    Squamous Epithelial / LPF 0-5 (*)    All other components within normal limits  CBG MONITORING, ED - Abnormal; Notable for the following:    Glucose-Capillary >600 (*)    All other components within normal limits  CBG MONITORING, ED - Abnormal; Notable for the following:    Glucose-Capillary 461 (*)    All other components within normal limits  CBG MONITORING, ED - Abnormal; Notable for the following:     Glucose-Capillary 334 (*)    All other components within normal limits  CBG MONITORING, ED - Abnormal; Notable for the following:    Glucose-Capillary 221 (*)    All other components within normal limits    Imaging Review No results found. I have personally reviewed and evaluated these images and lab results as part of my medical decision-making.   EKG Interpretation None      MDM  Labs from this morning reviewed.  Pt given IVFs, IV insulin to bring down his glucose.  No signs of DKA.  Pt given instr on the correct doses of his meds as ordered by his pcp.  Pt  given instr to return if worse.  I spoke with pt at length about the correct doses of meds.  I will give him a new rx for the doses his dr wanted him to take.  He voices understanding. Final diagnoses:  Hyperglycemia  Poorly controlled type 2 diabetes mellitus (HCC)    I personally performed the services described in this documentation, which was scribed in my presence. The recorded information has been reviewed and is accurate.    Jacalyn Lefevre, MD 12/31/15 [phone]

## 2015-12-31 NOTE — Pre-Procedure Instructions (Signed)
Patient's blood sugar 528 and was reported to Dr Jayme CloudGonzalez and Dr Anthony SarSimpson's office.  Patient advised of result and to report to the Emergency Department for treatment, per Dr Lodema HongSimpson and to make a follow up appointment.  Patient did not appear to understand his medication regimen and admitted that he was not able to afford strips for his meter, therefore has not been checking sugars at home.  Will follow up in am to review outcome.

## 2015-12-31 NOTE — Telephone Encounter (Signed)
Pls explain to patient that he needs the care of a diabetes specialist to get his blood sugar control, seems to be worse than in May when his hBa1C was over 12 I have entered the referral to Dr Fransico HimNida, pls obtain an appt date for him, and try your best he understands why it is extremely important and necessary to see the specialist If he can get appt next 1 to 2 weeks , I will make no additional adjustment except that the needs to increase metformin from 500mg  one twice daily to two twice daily, and hopefully he is actually taking the glipizide as prescribed I am sure he needs insulin and he wILL NEEED endo to managae him   ?? Or concerns , pls let me know  Pls send me a message to follow up the e call so I know what is going on, thanks

## 2015-12-31 NOTE — Telephone Encounter (Signed)
Short stay called this am and they were seeing [name]  for his pre op assessment and his glucose was 538. Advised ER and to call for a follow up appt.

## 2015-12-31 NOTE — ED Notes (Signed)
Here for pre op today and was called at home with glucose>500.

## 2016-01-01 ENCOUNTER — Other Ambulatory Visit: Payer: Self-pay

## 2016-01-01 MED ORDER — GLUCOSE BLOOD VI STRP: ORAL_STRIP | Status: DC

## 2016-01-01 MED ORDER — METFORMIN HCL 500 MG PO TABS: 1000.0000 mg | ORAL_TABLET | Freq: Two times a day (BID) | ORAL | Status: DC

## 2016-01-01 MED ORDER — METFORMIN HCL 500 MG PO TABS: 500.0000 mg | ORAL_TABLET | Freq: Two times a day (BID) | ORAL | Status: DC

## 2016-01-01 MED ORDER — ACCU-CHEK FASTCLIX LANCETS MISC: Status: DC

## 2016-01-01 NOTE — Telephone Encounter (Signed)
Patient aware.

## 2016-01-05 ENCOUNTER — Ambulatory Visit (HOSPITAL_COMMUNITY)
Admission: RE | Admit: 2016-01-05 | Discharge: 2016-01-05 | Disposition: A | Discharge status: Home / Self Care | Payer: BLUE CROSS/BLUE SHIELD | Source: Ambulatory Visit | Attending: Urology | Admitting: Urology

## 2016-01-05 ENCOUNTER — Ambulatory Visit (INDEPENDENT_AMBULATORY_CARE_PROVIDER_SITE_OTHER): Payer: BLUE CROSS/BLUE SHIELD | Admitting: Family Medicine

## 2016-01-05 ENCOUNTER — Encounter (HOSPITAL_COMMUNITY)
Admission: RE | Disposition: A | Discharge status: Home / Self Care | Payer: Self-pay | Source: Ambulatory Visit | Attending: Urology

## 2016-01-05 ENCOUNTER — Encounter (HOSPITAL_COMMUNITY): Payer: Self-pay | Admitting: *Deleted

## 2016-01-05 ENCOUNTER — Emergency Department (HOSPITAL_COMMUNITY)
Admission: EM | Admit: 2016-01-05 | Discharge: 2016-01-05 | Disposition: A | Discharge status: Home / Self Care | Payer: BLUE CROSS/BLUE SHIELD | Attending: Emergency Medicine | Admitting: Emergency Medicine

## 2016-01-05 ENCOUNTER — Encounter: Payer: Self-pay | Admitting: Family Medicine

## 2016-01-05 VITALS — BP 110/80 | HR 78 | Resp 18 | Ht 68.0 in | Wt 201.0 lb

## 2016-01-05 DIAGNOSIS — F329 Major depressive disorder, single episode, unspecified: Secondary | ICD-10-CM | POA: Diagnosis not present

## 2016-01-05 DIAGNOSIS — Z7982 Long term (current) use of aspirin: Secondary | ICD-10-CM | POA: Insufficient documentation

## 2016-01-05 DIAGNOSIS — E1169 Type 2 diabetes mellitus with other specified complication: Secondary | ICD-10-CM

## 2016-01-05 DIAGNOSIS — E1165 Type 2 diabetes mellitus with hyperglycemia: Secondary | ICD-10-CM | POA: Insufficient documentation

## 2016-01-05 DIAGNOSIS — Z7984 Long term (current) use of oral hypoglycemic drugs: Secondary | ICD-10-CM | POA: Insufficient documentation

## 2016-01-05 DIAGNOSIS — I10 Essential (primary) hypertension: Secondary | ICD-10-CM | POA: Insufficient documentation

## 2016-01-05 DIAGNOSIS — E119 Type 2 diabetes mellitus without complications: Secondary | ICD-10-CM | POA: Diagnosis not present

## 2016-01-05 DIAGNOSIS — Z5309 Procedure and treatment not carried out because of other contraindication: Secondary | ICD-10-CM | POA: Diagnosis not present

## 2016-01-05 DIAGNOSIS — Z79899 Other long term (current) drug therapy: Secondary | ICD-10-CM | POA: Insufficient documentation

## 2016-01-05 DIAGNOSIS — E669 Obesity, unspecified: Secondary | ICD-10-CM | POA: Diagnosis not present

## 2016-01-05 DIAGNOSIS — N471 Phimosis: Secondary | ICD-10-CM | POA: Diagnosis present

## 2016-01-05 DIAGNOSIS — F17210 Nicotine dependence, cigarettes, uncomplicated: Secondary | ICD-10-CM | POA: Insufficient documentation

## 2016-01-05 DIAGNOSIS — E785 Hyperlipidemia, unspecified: Secondary | ICD-10-CM | POA: Insufficient documentation

## 2016-01-05 DIAGNOSIS — R739 Hyperglycemia, unspecified: Secondary | ICD-10-CM

## 2016-01-05 LAB — BASIC METABOLIC PANEL
Anion gap: 7 (ref 5–15)
BUN: 23 mg/dL — AB (ref 6–20)
CALCIUM: 9.2 mg/dL (ref 8.9–10.3)
CO2: 29 mmol/L (ref 22–32)
Chloride: 96 mmol/L — ABNORMAL LOW (ref 101–111)
Creatinine, Ser: 1.27 mg/dL — ABNORMAL HIGH (ref 0.61–1.24)
GFR calc Af Amer: >60 mL/min (ref >60)
GLUCOSE: 326 mg/dL — AB (ref 65–99)
Potassium: 4 mmol/L (ref 3.5–5.1)
Sodium: 132 mmol/L — ABNORMAL LOW (ref 135–145)

## 2016-01-05 LAB — GLUCOSE, CAPILLARY: GLUCOSE-CAPILLARY: 319 mg/dL — AB (ref 65–99)

## 2016-01-05 LAB — CBG MONITORING, ED
GLUCOSE-CAPILLARY: 327 mg/dL — AB (ref 65–99)
Glucose-Capillary: 302 mg/dL — ABNORMAL HIGH (ref 65–99)

## 2016-01-05 LAB — GLUCOSE, POCT (MANUAL RESULT ENTRY): POC Glucose: 287 mg/dl — AB (ref 70–99)

## 2016-01-05 SURGERY — CIRCUMCISION, ADULT
Anesthesia: Choice

## 2016-01-05 MED ORDER — SODIUM CHLORIDE 0.9 % IV BOLUS (SEPSIS)
500.0000 mL | Freq: Once | INTRAVENOUS | Status: AC
  Administered 2016-01-05: 500 mL via INTRAVENOUS

## 2016-01-05 MED ORDER — CEFAZOLIN IN D5W 1 GM/50ML IV SOLN
INTRAVENOUS | Status: AC
  Filled 2016-01-05: qty 50

## 2016-01-05 MED ORDER — SITAGLIP PHOS-METFORMIN HCL ER 50-1000 MG PO TB24: 2.0000 | ORAL_TABLET | Freq: Every day | ORAL | Status: DC

## 2016-01-05 MED ORDER — CEFAZOLIN SODIUM-DEXTROSE 2-4 GM/100ML-% IV SOLN: 2.0000 g | INTRAVENOUS | Status: DC

## 2016-01-05 MED ORDER — SODIUM CHLORIDE 0.9 % IV SOLN: INTRAVENOUS | Status: DC

## 2016-01-05 NOTE — Assessment & Plan Note (Signed)
Controlled, no change in medication DASH diet and commitment to daily physical activity for a minimum of 30 minutes discussed and encouraged, as a part of hypertension management. The importance of attaining a healthy weight is also discussed.  BP/Weight 01/05/2016 01/05/2016 01/05/2016 12/31/2015 12/31/2015 11/09/2015 08/30/2015  Systolic BP 110 127 131 153 153 130 166  Diastolic BP 80 77 79 98 91 80 89  Wt. (Lbs) 201 195 - 196 196 206.4 202  BMI 30.57 29.66 - 29.81 29.81 31.39 30.72  Some encounter information is confidential and restricted. Go to Review Flowsheets activity to see all data.

## 2016-01-05 NOTE — H&P (Signed)
Urology History and Physical Exam  CC: Foreskin problems  HPI: 55 year old male presents for elective circumcision. I saw him in April 2017 as an emergency room consult for paraphimosis. This was reduced  by manual pressure, albeit with difficulty. He did have phimosis before that. He had never before had significant problems with paraphimosis. The patient would like to have a circumcision. He does have problems with tearing of his foreskin with retracting and having sex. He has had several episodes of inflammation of his foreskin.  Past Medical History   PMH: Past Medical History  Diagnosis Date  . Depression 2007  . Hyperlipidemia   . Hypertension   . Hypogonadism male   . Anxiety   . Diabetes mellitus without complication (HCC)     PSH: Past Surgical History  Procedure Laterality Date  . Hernia repair      ventral hernia repair   . Colonoscopy  01/20/2011    Procedure: COLONOSCOPY;  Surgeon: Arlyce Harman, MD;  Location: [facility];  Service: Endoscopy;  Laterality: N/A;    Allergies: No Known Allergies  Medications: No prescriptions prior to admission     Social History: Social History   Social History  . Marital Status: Married    Spouse Name: N/A  . Number of Children: 1  . Years of Education: N/A   Occupational History  . Dispensing optician    Social History Main Topics  . Smoking status: Current Every Day Smoker    Types: Cigarettes  . Smokeless tobacco: Not on file  . Alcohol Use: 0.6 oz/week    1 Cans of beer per week     Comment: occasional beer socially.  . Drug Use: No  . Sexual Activity: Yes   Other Topics Concern  . Not on file   Social History Narrative    Family History: Family History  Problem Relation Age of Onset  . Heart failure Mother     living   . Hypertension Mother   . Colon cancer Neg Hx     Review of Systems: Positive: Foreskin problems Negative:   A further 10 point review of systems was negative except what is  listed in the HPI.                  Physical Exam: @ General: No acute distress.  Awake. Head:  Normocephalic.  Atraumatic. ENT:  EOMI.  Mucous membranes moist Neck:  Supple.  No lymphadenopathy. CV:  S1 present. S2 present. Regular rate. Pulmonary: Equal effort bilaterally.  Clear to auscultation bilaterally. Abdomen: Soft.  Nontender to palpation. Skin:  Normal turgor.  No visible rash. Extremity: No gross deformity of bilateral upper extremities.  No gross deformity of                             lower extremities. Neurologic: Alert. Appropriate mood.  Penis:  Uncircumcised.  No lesions. Phimosis Urethra: Orthotopic meatus. Scrotum: No lesions.  No ecchymosis.  No erythema. Testicles: Descended bilaterally.  No masses bilaterally. Epididymis: Palpable bilaterally. Nontender to palpation.  Studies:  No results for input(s): HGB, WBC, PLT in the last 72 hours.  No results for input(s): NA, K, CL, CO2, BUN, CREATININE, CALCIUM, GFRNONAA, GFRAA in the last 72 hours.  Invalid input(s): MAGNESIUM   No results for input(s): INR, APTT in the last 72 hours.  Invalid input(s): PT   Invalid input(s): ABG    Assessment:  Significant  phimosis  Plan: Circumcision

## 2016-01-05 NOTE — Progress Notes (Signed)
   Marye RoundGary K Graber     MRN: [id number]      DOB: 22-Feb-1961   HPI Mr. Jenelle MagesHairston is here due to persistently elevated blood sugar, he was to have eye surgery today, and his blood sugar was over 500. Arrangement had been made for endo eval asap and medcation dose when a similar call was made last week, still no improvement in blood sugars in patient who will require ALOT of individual teaching and close follow up to get blood sugar control States he has been testing fasting bloood sugars and they are often over 300, has poor eating habits, no cooking/ food prep at home and eats on avg twice daily Denies drinking sweetened drinks, had not increased metformin dose as instructed last week as far as I am able to understand, however pt does noot have his medication and has GREAT difficulty naming the medications that he takes  ROS Denies recent fever or chills. Denies sinus pressure, nasal congestion, ear pain or sore throat. Denies chest congestion, productive cough or wheezing. C/o blurred vision, excess thirst and urination and fatigue Denies skin break down or rash.   PE  BP 110/80 mmHg  Pulse 78  Resp 18  Ht 5\' 8"  (1.727 m)  Wt 201 lb (91.173 kg)  BMI 30.57 kg/m2  SpO2 98%  Patient alert and oriented and in no cardiopulmonary distress.  HEENT: No facial asymmetry, EOMI,   oropharynx pink and moist.  Neck supple no JVD, no mass.  Chest: Clear to auscultation bilaterally.  CVS: S1, S2 no murmurs, no S3.Regular rate.  ABD: Soft non tender.   Ext: No edema  Assessment & Plan  Diabetes mellitus type 2 in obese (HCC) Uncontrolled, try to change to janumet from metformin and need diabetic educator, has appt with endo next week, endo will arrange this. HE has already received insulin at the hospital earlier this morning , no additional medication given Continue glipizide 20 mg daily  Essential hypertension Controlled, no change in medication DASH diet and commitment to daily  physical activity for a minimum of 30 minutes discussed and encouraged, as a part of hypertension management. The importance of attaining a healthy weight is also discussed.  BP/Weight 01/05/2016 01/05/2016 01/05/2016 12/31/2015 12/31/2015 11/09/2015 08/30/2015  Systolic BP 110 127 131 153 153 130 166  Diastolic BP 80 77 79 98 91 80 89  Wt. (Lbs) 201 195 - 196 196 206.4 202  BMI 30.57 29.66 - 29.81 29.81 31.39 30.72  Some encounter information is confidential and restricted. Go to Review Flowsheets activity to see all data.

## 2016-01-05 NOTE — ED Provider Notes (Signed)
CSN: [id number]     Arrival date & time 01/05/16  [id number] History   First MD Initiated Contact with Patient 01/05/16 [phone]     Chief Complaint  Patient presents with  . Hyperglycemia     (Consider location/radiation/quality/duration/timing/severity/associated sxs/prior Treatment) Patient is a 55 y.o. male presenting with hyperglycemia. The history is provided by the patient.  Hyperglycemia Associated symptoms: no abdominal pain, no chest pain, no confusion, no dysuria, no fever and no shortness of breath   Patient sent from same day surgery where he was due to have a circumcision today. For elevated blood sugars. It was 319. Surgery has been canceled. Patient is followed by Dr. Lodema HongSimpson locally. Apparently they want his blood sugars below 200 to do the surgery. Patient without any specific complaints.  Past Medical History  Diagnosis Date  . Depression 2007  . Hyperlipidemia   . Hypertension   . Hypogonadism male   . Anxiety   . Diabetes mellitus without complication [facility](HCC)    Past Surgical History  Procedure Laterality Date  . Hernia repair      ventral hernia repair   . Colonoscopy  01/20/2011    Procedure: COLONOSCOPY;  Surgeon: Arlyce HarmanSandi M Fields, MD;  Location: [facility];  Service: Endoscopy;  Laterality: N/A;   Family History  Problem Relation Age of Onset  . Heart failure Mother     living   . Hypertension Mother   . Colon cancer Neg Hx    Social History  Substance Use Topics  . Smoking status: Current Every Day Smoker    Types: Cigarettes  . Smokeless tobacco: None  . Alcohol Use: 0.6 oz/week    1 Cans of beer per week     Comment: occasional beer socially.    Review of Systems  Constitutional: Negative for fever.  HENT: Negative for congestion.   Eyes: Negative for visual disturbance.  Respiratory: Negative for shortness of breath.   Cardiovascular: Negative for chest pain.  Gastrointestinal: Negative for abdominal pain.  Genitourinary: Negative for dysuria.   Musculoskeletal: Negative for back pain.  Skin: Negative for rash.  Neurological: Negative for headaches.  Hematological: Does not bruise/bleed easily.  Psychiatric/Behavioral: Negative for confusion.      Allergies  Review of patient's allergies indicates no known allergies.  Home Medications   Prior to Admission medications   Medication Sig Start Date End Date Taking? Authorizing Provider  ACCU-CHEK FASTCLIX LANCETS MISC Once daily testing dx E11.9 01/01/16   Kerri PerchesMargaret E Simpson, MD  ALPRAZolam Prudy Feeler(XANAX) 1 MG tablet Take 1 tablet (1 mg total) by mouth daily as needed for anxiety. 10/23/15 10/22/16  Myrlene Brokereborah R Ross, MD  aspirin (RA ASPIRIN EC ADULT LOW ST) 81 MG EC tablet Take 1 tablet by mouth at bedtime. 09/18/13   Kerri PerchesMargaret E Simpson, MD  atorvastatin (LIPITOR) 40 MG tablet Take 1 tablet (40 mg total) by mouth daily. 11/06/15   Kerri PerchesMargaret E Simpson, MD  benazepril (LOTENSIN) 10 MG tablet TAKE ONE TABLET BY MOUTH ONCE DAILY 08/24/15   Kerri PerchesMargaret E Simpson, MD  glipiZIDE (GLUCOTROL XL) 10 MG 24 hr tablet Two tablets every morning at breakfast 12/31/15   Jacalyn LefevreJulie Haviland, MD  glucose blood [facility](ACCU-CHEK ACTIVE STRIPS) test strip Use as instructed once daily dx E11.9 01/01/16   Kerri PerchesMargaret E Simpson, MD  metFORMIN (GLUCOPHAGE) 500 MG tablet Take 2 tablets (1,000 mg total) by mouth 2 (two) times daily with a meal. Reported on 11/09/2015 01/01/16   Kerri PerchesMargaret E Simpson, MD  temazepam (RESTORIL)  30 MG capsule Take 1 capsule (30 mg total) by mouth at bedtime. 10/23/15   Myrlene Broker, MD  traZODone (DESYREL) 100 MG tablet Take 1 tablet (100 mg total) by mouth at bedtime. 10/23/15   Myrlene Broker, MD  triamterene-hydrochlorothiazide (MAXZIDE) 75-50 MG tablet TAKE ONE TABLET BY MOUTH ONCE DAILY 07/27/15   Kerri Perches, MD  venlafaxine XR (EFFEXOR XR) 150 MG 24 hr capsule Take 2 capsules (300 mg total) by mouth daily with breakfast. 10/23/15   Myrlene Broker, MD   BP 117/75 mmHg  Pulse 78  Temp(Src) 97.8 F (36.6 C)  (Oral)  Resp 16  Ht 5\' 8"  (1.727 m)  Wt 88.451 kg  BMI 29.66 kg/m2  SpO2 97% Physical Exam  Constitutional: He is oriented to person, place, and time. He appears well-developed and well-nourished. No distress.  HENT:  Head: Normocephalic and atraumatic.  Mouth/Throat: Oropharynx is clear and moist.  Eyes: Conjunctivae and EOM are normal. Pupils are equal, round, and reactive to light.  Neck: Normal range of motion. Neck supple.  Cardiovascular: Normal rate, regular rhythm and normal heart sounds.   No murmur heard. Pulmonary/Chest: Effort normal and breath sounds normal. No respiratory distress.  Abdominal: Soft. Bowel sounds are normal. There is no tenderness.  Musculoskeletal: Normal range of motion.  Neurological: He is alert and oriented to person, place, and time. No cranial nerve deficit. He exhibits normal muscle tone. Coordination normal.  Skin: Skin is warm. No rash noted.  Nursing note and vitals reviewed.   ED Course  Procedures (including critical care time) Labs Review Labs Reviewed  BASIC METABOLIC PANEL - Abnormal; Notable for the following:    Sodium 132 (*)    Chloride 96 (*)    Glucose, Bld 326 (*)    BUN 23 (*)    Creatinine, Ser 1.27 (*)    All other components within normal limits  CBG MONITORING, ED - Abnormal; Notable for the following:    Glucose-Capillary 327 (*)    All other components within normal limits   Results for orders placed or performed during the hospital encounter of 01/05/16  Basic metabolic panel  Result Value Ref Range   Sodium 132 (L) 135 - 145 mmol/L   Potassium 4.0 3.5 - 5.1 mmol/L   Chloride 96 (L) 101 - 111 mmol/L   CO2 29 22 - 32 mmol/L   Glucose, Bld 326 (H) 65 - 99 mg/dL   BUN 23 (H) 6 - 20 mg/dL   Creatinine, Ser 1.61 (H) 0.61 - 1.24 mg/dL   Calcium 9.2 8.9 - 09.6 mg/dL   GFR calc non Af Amer >60 >60 mL/min   GFR calc Af Amer >60 >60 mL/min   Anion gap 7 5 - 15  CBG monitoring, ED  Result Value Ref Range    Glucose-Capillary 327 (H) 65 - 99 mg/dL     Imaging Review No results found. I have personally reviewed and evaluated these images and lab results as part of my medical decision-making.   EKG Interpretation None      MDM   Final diagnoses:  Hyperglycemia    Patient sent over from same day surgery for elevated blood sugar. Patient's blood sugar here rechecked 326 no evidence of any acidosis. Surgeries already been canceled. No medical intervention required today but follow-up with his primary care doctor for tighter blood sugar controls that they can do the procedure would be appropriate. Patient without any other complaints or symptoms.  Since patient  was nothing by mouth since midnight patient treated here with some IV fluids.  Vanetta Mulders, MD 01/05/16 [id number]

## 2016-01-05 NOTE — Patient Instructions (Signed)
F/u in August as before  NEED to keep appt with diabetes specialist   Pls change to eating much more vegetables  Walk dog twice every day  STOP metformin , new instead is janumet TWO every day,   You need to see diabetic educator

## 2016-01-05 NOTE — ED Notes (Addendum)
Pt comes in from day surgery where he was scheduled to get a circumcision today. His blood sugar is 319 which causes a delay in surgery. Pt was sent here for evaluation.

## 2016-01-05 NOTE — Discharge Instructions (Signed)
Follow-up with your primary care doctor for better blood sugar control so that they can do the surgical procedure. Return for any new or worse symptoms.

## 2016-01-05 NOTE — Assessment & Plan Note (Addendum)
Uncontrolled, try to change to janumet from metformin and need diabetic educator, has appt with endo next week, endo will arrange this. HE has already received insulin at the hospital earlier this morning , no additional medication given Continue glipizide 20 mg daily

## 2016-01-05 NOTE — Progress Notes (Signed)
Dr Jayme CloudGonzalez and Dr Retta Dionesahlstedt notified of elevated blood sugar. Surgery cancelled. Taken to ED for further evaluation. Pt agreeable.

## 2016-01-05 NOTE — ED Notes (Signed)
Lab at bedside

## 2016-01-13 ENCOUNTER — Encounter: Payer: Self-pay | Admitting: "Endocrinology

## 2016-01-13 ENCOUNTER — Ambulatory Visit (INDEPENDENT_AMBULATORY_CARE_PROVIDER_SITE_OTHER): Payer: BLUE CROSS/BLUE SHIELD | Admitting: "Endocrinology

## 2016-01-13 VITALS — BP 118/64 | HR 82 | Resp 18 | Ht 68.0 in | Wt 195.0 lb

## 2016-01-13 DIAGNOSIS — E669 Obesity, unspecified: Secondary | ICD-10-CM

## 2016-01-13 DIAGNOSIS — E119 Type 2 diabetes mellitus without complications: Secondary | ICD-10-CM

## 2016-01-13 DIAGNOSIS — I10 Essential (primary) hypertension: Secondary | ICD-10-CM | POA: Diagnosis not present

## 2016-01-13 DIAGNOSIS — E785 Hyperlipidemia, unspecified: Secondary | ICD-10-CM

## 2016-01-13 DIAGNOSIS — E1169 Type 2 diabetes mellitus with other specified complication: Secondary | ICD-10-CM

## 2016-01-13 MED ORDER — DAPAGLIFLOZIN PROPANEDIOL 5 MG PO TABS: 5.0000 mg | ORAL_TABLET | Freq: Every day | ORAL | 2 refills | Status: DC

## 2016-01-13 NOTE — Progress Notes (Signed)
Subjective:    Patient ID: John [name], male    DOB: 08-01-1960. Patient is being seen in consultation for management of diabetes requested by  Syliva Overman, MD  Past Medical History:  Diagnosis Date  . Anxiety   . Depression 2007  . Diabetes mellitus without complication (HCC)   . Hyperlipidemia   . Hypertension   . Hypogonadism male    Past Surgical History:  Procedure Laterality Date  . COLONOSCOPY  01/20/2011   Procedure: COLONOSCOPY;  Surgeon: Arlyce Harman, MD;  Location: [facility];  Service: Endoscopy;  Laterality: N/A;  . HERNIA REPAIR     ventral hernia repair    Social History   Social History  . Marital status: Married    Spouse name: N/A  . Number of children: 1  . Years of education: N/A   Occupational History  . Dispensing optician    Social History Main Topics  . Smoking status: Never Smoker  . Smokeless tobacco: None  . Alcohol use 0.6 oz/week    1 Cans of beer per week     Comment: occasional beer socially.  . Drug use: No  . Sexual activity: Yes   Other Topics Concern  . None   Social History Narrative  . None   Outpatient Encounter Prescriptions as of 01/13/2016  Medication Sig  . ACCU-CHEK FASTCLIX LANCETS MISC Once daily testing dx E11.9  . ALPRAZolam (XANAX) 1 MG tablet Take 1 tablet (1 mg total) by mouth daily as needed for anxiety.  [name] aspirin (RA ASPIRIN EC ADULT LOW ST) 81 MG EC tablet Take 1 tablet by mouth at bedtime.  [name] atorvastatin (LIPITOR) 40 MG tablet Take 1 tablet (40 mg total) by mouth daily.  . benazepril (LOTENSIN) 10 MG tablet TAKE ONE TABLET BY MOUTH ONCE DAILY  . glucose blood (ACCU-CHEK ACTIVE STRIPS) test strip Use as instructed once daily dx E11.9  . temazepam (RESTORIL) 30 MG capsule Take 1 capsule (30 mg total) by mouth at bedtime.  . traZODone (DESYREL) 100 MG tablet Take 1 tablet (100 mg total) by mouth at bedtime.  . triamterene-hydrochlorothiazide (MAXZIDE) 75-50 MG tablet TAKE ONE TABLET BY MOUTH ONCE  DAILY  . venlafaxine XR (EFFEXOR XR) 150 MG 24 hr capsule Take 2 capsules (300 mg total) by mouth daily with breakfast.  . [DISCONTINUED] glipiZIDE (GLUCOTROL XL) 10 MG 24 hr tablet Two tablets every morning at breakfast  . [DISCONTINUED] SitaGLIPtin-MetFORMIN HCl 50-1000 MG TB24 Take 2 tablets by mouth daily.  . dapagliflozin propanediol (FARXIGA) 5 MG TABS tablet Take 5 mg by mouth daily.   No facility-administered encounter medications on file as of 01/13/2016.    ALLERGIES: Allergies  Allergen Reactions  . Janumet [Sitagliptin-Metformin Hcl] Nausea And Vomiting   VACCINATION STATUS: Immunization History  Administered Date(s) Administered  . Influenza Whole 03/18/2010, 04/13/2011  . Influenza,inj,Quad PF,36+ Mos 05/20/2013, 05/08/2014, 06/11/2015  . Pneumococcal Polysaccharide-23 11/26/2013  . Td 09/15/2009    Diabetes  He presents for his initial diabetic visit. He has type 2 diabetes mellitus. Onset time: He was diagnosed at age 2. There are no hypoglycemic associated symptoms. Pertinent negatives for hypoglycemia include no headaches, seizures or tremors. Associated symptoms include polydipsia and polyuria. Pertinent negatives for diabetes include no chest pain. There are no hypoglycemic complications. Symptoms are worsening. There are no diabetic complications. Risk factors for coronary artery disease include dyslipidemia, diabetes mellitus, hypertension, male sex, obesity and sedentary lifestyle. Current diabetic treatment includes oral agent (monotherapy).  His weight is decreasing steadily. He is following a generally unhealthy diet. He has not had a previous visit with a dietitian. He never participates in exercise. Home blood sugar record trend: He did not bring any meter nor logs to review today. He admits he does not monitor blood glucose regularly. His fasting blood glucose today was 161. An ACE inhibitor/angiotensin II receptor blocker is being taken. Eye exam is current.   Hyperlipidemia  This is a chronic problem. The current episode started more than 1 year ago. Pertinent negatives include no chest pain, myalgias or shortness of breath. Current antihyperlipidemic treatment includes statins. Risk factors for coronary artery disease include diabetes mellitus, hypertension, male sex, obesity and a sedentary lifestyle.  Hypertension  This is a chronic problem. The current episode started more than 1 year ago. The problem is controlled. Pertinent negatives include no chest pain, headaches, palpitations or shortness of breath. Risk factors for coronary artery disease include dyslipidemia, diabetes mellitus, male gender, obesity and sedentary lifestyle.    Review of Systems  Constitutional: Negative for chills and fever.  Eyes: Positive for visual disturbance.  Respiratory: Negative for cough and shortness of breath.   Cardiovascular: Negative for chest pain and palpitations.       No Shortness of breath  Gastrointestinal: Negative for abdominal pain, diarrhea, nausea and vomiting.  Endocrine: Positive for polydipsia and polyuria.  Genitourinary: Negative for frequency, hematuria and urgency.  Musculoskeletal: Negative for myalgias.  Skin: Negative for rash.  Neurological: Negative for tremors, seizures and headaches.  Hematological: Does not bruise/bleed easily.  Psychiatric/Behavioral: Negative for hallucinations and suicidal ideas.    Objective:    BP 118/64 (BP Location: Left Arm, Patient Position: Sitting, Cuff Size: Large)   Pulse 82   Resp 18   Ht 5\' 8"  (1.727 m)   Wt 195 lb (88.5 kg)   SpO2 98%   BMI 29.65 kg/m   Wt Readings from Last 3 Encounters:  01/13/16 195 lb (88.5 kg)  01/05/16 201 lb (91.2 kg)  01/05/16 195 lb (88.5 kg)    Physical Exam  Constitutional: He is oriented to person, place, and time. He appears well-developed and well-nourished. He is cooperative. No distress.  HENT:  Head: Normocephalic and atraumatic.  Eyes: EOM are  normal.  Neck: Normal range of motion. Neck supple. No tracheal deviation present. No thyromegaly present.  Cardiovascular: Normal rate, S1 normal, S2 normal and normal heart sounds.  Exam reveals no gallop.   No murmur heard. Pulses:      Dorsalis pedis pulses are 1+ on the right side, and 1+ on the left side.       Posterior tibial pulses are 1+ on the right side, and 1+ on the left side.  Pulmonary/Chest: Breath sounds normal. No respiratory distress. He has no wheezes.  Abdominal: Soft. Bowel sounds are normal. He exhibits no distension. There is no tenderness. There is no guarding and no CVA tenderness.  Musculoskeletal: He exhibits no edema.       Right shoulder: He exhibits no swelling and no deformity.  Neurological: He is alert and oriented to person, place, and time. He has normal strength and normal reflexes. No cranial nerve deficit or sensory deficit. Gait normal.  Skin: Skin is warm and dry. No rash noted. No cyanosis. Nails show no clubbing.  Psychiatric: He has a normal mood and affect. His speech is normal and behavior is normal. Judgment and thought content normal. Cognition and memory are normal.  CMP ( most recent) CMP     Component Value Date/Time   NA 132 (L) 01/05/2016 0744   K 4.0 01/05/2016 0744   CL 96 (L) 01/05/2016 0744   CO2 29 01/05/2016 0744   GLUCOSE 326 (H) 01/05/2016 0744   BUN 23 (H) 01/05/2016 0744   CREATININE 1.27 (H) 01/05/2016 0744   CREATININE 1.25 10/23/2015 0723   CALCIUM 9.2 01/05/2016 0744   PROT 7.3 10/23/2015 0723   ALBUMIN 4.2 10/23/2015 0723   AST 30 10/23/2015 0723   ALT 41 10/23/2015 0723   ALKPHOS 96 10/23/2015 0723   BILITOT 0.6 10/23/2015 0723   GFRNONAA >60 01/05/2016 0744   GFRNONAA 64 10/23/2015 0723   GFRAA >60 01/05/2016 0744   GFRAA 74 10/23/2015 0723     Diabetic Labs (most recent): Lab Results  Component Value Date   HGBA1C 12.3 (H) 10/23/2015   HGBA1C 6.7 (H) 09/17/2014   HGBA1C 5.9 (H) 05/07/2014      Lipid Panel ( most recent) Lipid Panel     Component Value Date/Time   CHOL 299 (H) 10/23/2015 0723   TRIG 224 (H) 10/23/2015 0723   HDL 41 10/23/2015 0723   CHOLHDL 7.3 (H) 10/23/2015 0723   VLDL 45 (H) 10/23/2015 0723   LDLCALC 213 (H) 10/23/2015 0723              Assessment & Plan:   1. Diabetes mellitus type 2 in obese [facility])  - Patient has currently uncontrolled symptomatic type 2 DM since  55 years of age,  with most recent A1c of 12.3 %. Recent labs reviewed.   He is diabetes is complicated by obesity and patient remains at a high risk for more acute and chronic complications of diabetes which include CAD, CVA, CKD, retinopathy, and neuropathy. These are all discussed in detail with the patient.  - I have counseled the patient on diet management and weight loss, by adopting a carbohydrate restricted/protein rich diet.  - Suggestion is made for patient to avoid simple carbohydrates   from their diet including Cakes , Desserts, Ice Cream,  Soda (  diet and regular) , Sweet Tea , Candies,  Chips, Cookies, Artificial Sweeteners,   and "Sugar-free" Products . This will help patient to have stable blood glucose profile and potentially avoid unintended weight gain.  - I encouraged the patient to switch to  unprocessed or minimally processed complex starch and increased protein intake (animal or plant source), fruits, and vegetables.  - Patient is advised to stick to a routine mealtimes to eat 3 meals  a day and avoid unnecessary snacks ( to snack only to correct hypoglycemia).  - The patient will be scheduled with Norm Salt, RDN, CDE for individualized DM education.  - I have approached patient with the following individualized plan to manage diabetes and patient agrees:   - I  will proceed to initiate strict monitoring of glucose  AC and HS. - Patient would likely require at least a brief period of insulin treatment to achieve control, based on his blood glucose readings  and commitment.  -Patient is encouraged to call clinic for blood glucose levels less than 70 or above 300 mg /dl.  - I will discontinue glipizide, risk outweighs benefit for this patient. -He does not tolerate Janumet due to GI intolerance. I will discontinue Janumet. - I will prescribe Farxiga 5 mg by mouth every morning. Side effects and precautions discussed with him.   - Patient will be considered for incretin  therapy as appropriate next visit. - Patient specific target  A1c;  LDL, HDL, Triglycerides, and  Waist Circumference were discussed in detail.  2) BP/HTN: Controlled. Continue current medications including ACEI/ARB. 3) Lipids/HPL:  uncontrolled,  continue statins. 4)  Weight/Diet: CDE Consult will be initiated , exercise, and detailed carbohydrates information provided.  5) Chronic Care/Health Maintenance:  -Patient is on ACEI/ARB and Statin medications and encouraged to continue to follow up with Ophthalmology, Podiatrist at least yearly or according to recommendations, and advised to   stay away from smoking. I have recommended yearly flu vaccine and pneumonia vaccination at least every 5 years; moderate intensity exercise for up to 150 minutes weekly; and  sleep for at least 7 hours a day.  - 60 minutes of time was spent on the care of this patient , 50% of which was applied for counseling on diabetes complications and their preventions.  - Patient to bring meter and  blood glucose logs during their next visit.   - I advised patient to maintain close follow up with Syliva Overman, MD for primary care needs.  Follow up plan: - Return in about 1 week (around 01/20/2016) for follow up with meter and logs- no labs.  Marquis Lunch, MD Phone: [phone]  Fax: [phone]   01/13/2016, 1:19 PM

## 2016-01-20 ENCOUNTER — Encounter: Payer: Self-pay | Admitting: "Endocrinology

## 2016-01-20 ENCOUNTER — Ambulatory Visit (INDEPENDENT_AMBULATORY_CARE_PROVIDER_SITE_OTHER): Payer: BLUE CROSS/BLUE SHIELD | Admitting: "Endocrinology

## 2016-01-20 VITALS — BP 130/81 | HR 74

## 2016-01-20 DIAGNOSIS — I10 Essential (primary) hypertension: Secondary | ICD-10-CM | POA: Diagnosis not present

## 2016-01-20 DIAGNOSIS — E669 Obesity, unspecified: Secondary | ICD-10-CM

## 2016-01-20 DIAGNOSIS — E119 Type 2 diabetes mellitus without complications: Secondary | ICD-10-CM

## 2016-01-20 DIAGNOSIS — E1169 Type 2 diabetes mellitus with other specified complication: Secondary | ICD-10-CM

## 2016-01-20 DIAGNOSIS — E785 Hyperlipidemia, unspecified: Secondary | ICD-10-CM

## 2016-01-20 DIAGNOSIS — E663 Overweight: Secondary | ICD-10-CM | POA: Diagnosis not present

## 2016-01-20 MED ORDER — DAPAGLIFLOZIN PROPANEDIOL 10 MG PO TABS: 10.0000 mg | ORAL_TABLET | Freq: Every day | ORAL | 3 refills | Status: DC

## 2016-01-20 NOTE — Patient Instructions (Signed)

## 2016-01-20 NOTE — Progress Notes (Signed)
Subjective:    Patient ID: John [name], male    DOB: 05/15/1961. Patient is being seen in f/u  for management of diabetes requested by  Syliva Overman, MD  Past Medical History:  Diagnosis Date  . Anxiety   . Depression 2007  . Diabetes mellitus without complication (HCC)   . Hyperlipidemia   . Hypertension   . Hypogonadism male    Past Surgical History:  Procedure Laterality Date  . COLONOSCOPY  01/20/2011   Procedure: COLONOSCOPY;  Surgeon: Arlyce Harman, MD;  Location: [facility];  Service: Endoscopy;  Laterality: N/A;  . HERNIA REPAIR     ventral hernia repair    Social History   Social History  . Marital status: Married    Spouse name: N/A  . Number of children: 1  . Years of education: N/A   Occupational History  . Dispensing optician    Social History Main Topics  . Smoking status: Never Smoker  . Smokeless tobacco: None  . Alcohol use 0.6 oz/week    1 Cans of beer per week     Comment: occasional beer socially.  . Drug use: No  . Sexual activity: Yes   Other Topics Concern  . None   Social History Narrative  . None   Outpatient Encounter Prescriptions as of 01/20/2016  Medication Sig  . ACCU-CHEK FASTCLIX LANCETS MISC Once daily testing dx E11.9  . ALPRAZolam (XANAX) 1 MG tablet Take 1 tablet (1 mg total) by mouth daily as needed for anxiety.  [name] aspirin (RA ASPIRIN EC ADULT LOW ST) 81 MG EC tablet Take 1 tablet by mouth at bedtime.  [name] atorvastatin (LIPITOR) 40 MG tablet Take 1 tablet (40 mg total) by mouth daily.  . benazepril (LOTENSIN) 10 MG tablet TAKE ONE TABLET BY MOUTH ONCE DAILY  . glucose blood (ACCU-CHEK ACTIVE STRIPS) test strip Use as instructed once daily dx E11.9  . temazepam (RESTORIL) 30 MG capsule Take 1 capsule (30 mg total) by mouth at bedtime.  . traZODone (DESYREL) 100 MG tablet Take 1 tablet (100 mg total) by mouth at bedtime.  . triamterene-hydrochlorothiazide (MAXZIDE) 75-50 MG tablet TAKE ONE TABLET BY MOUTH ONCE DAILY  .  venlafaxine XR (EFFEXOR XR) 150 MG 24 hr capsule Take 2 capsules (300 mg total) by mouth daily with breakfast.  . [DISCONTINUED] dapagliflozin propanediol (FARXIGA) 5 MG TABS tablet Take 5 mg by mouth daily.  . dapagliflozin propanediol (FARXIGA) 10 MG TABS tablet Take 10 mg by mouth daily.   No facility-administered encounter medications on file as of 01/20/2016.    ALLERGIES: Allergies  Allergen Reactions  . Janumet [Sitagliptin-Metformin Hcl] Nausea And Vomiting   VACCINATION STATUS: Immunization History  Administered Date(s) Administered  . Influenza Whole 03/18/2010, 04/13/2011  . Influenza,inj,Quad PF,36+ Mos 05/20/2013, 05/08/2014, 06/11/2015  . Pneumococcal Polysaccharide-23 11/26/2013  . Td 09/15/2009    Diabetes  He presents for his follow-up diabetic visit. He has type 2 diabetes mellitus. Onset time: He was diagnosed at age 55. His disease course has been improving. There are no hypoglycemic associated symptoms. Pertinent negatives for hypoglycemia include no headaches, seizures or tremors. Associated symptoms include polydipsia and polyuria. Pertinent negatives for diabetes include no chest pain. There are no hypoglycemic complications. Symptoms are improving. There are no diabetic complications. Risk factors for coronary artery disease include dyslipidemia, diabetes mellitus, hypertension, male sex, obesity and sedentary lifestyle. Current diabetic treatment includes oral agent (monotherapy). His weight is decreasing steadily. He is following  a generally unhealthy diet. He has not had a previous visit with a dietitian. He never participates in exercise. Home blood sugar record trend: He did not bring any meter nor logs to review today. He admits he does not monitor blood glucose regularly. His fasting blood glucose today was 161. His breakfast blood glucose range is generally 110-130 mg/dl. His lunch blood glucose range is generally 140-180 mg/dl. His dinner blood glucose range is  generally 140-180 mg/dl. His overall blood glucose range is 140-180 mg/dl. An ACE inhibitor/angiotensin II receptor blocker is being taken. Eye exam is current.  Hyperlipidemia  This is a chronic problem. The current episode started more than 1 year ago. Pertinent negatives include no chest pain, myalgias or shortness of breath. Current antihyperlipidemic treatment includes statins. Risk factors for coronary artery disease include diabetes mellitus, hypertension, male sex, obesity and a sedentary lifestyle.  Hypertension  This is a chronic problem. The current episode started more than 1 year ago. The problem is controlled. Pertinent negatives include no chest pain, headaches, palpitations or shortness of breath. Risk factors for coronary artery disease include dyslipidemia, diabetes mellitus, male gender, obesity and sedentary lifestyle.    Review of Systems  Constitutional: Negative for chills and fever.  Eyes: Positive for visual disturbance.  Respiratory: Negative for cough and shortness of breath.   Cardiovascular: Negative for chest pain and palpitations.       No Shortness of breath  Gastrointestinal: Negative for abdominal pain, diarrhea, nausea and vomiting.  Endocrine: Positive for polydipsia and polyuria.  Genitourinary: Negative for frequency, hematuria and urgency.  Musculoskeletal: Negative for myalgias.  Skin: Negative for rash.  Neurological: Negative for tremors, seizures and headaches.  Hematological: Does not bruise/bleed easily.  Psychiatric/Behavioral: Negative for hallucinations and suicidal ideas.    Objective:    BP 130/81   Pulse 74   Wt Readings from Last 3 Encounters:  01/13/16 195 lb (88.5 kg)  01/05/16 201 lb (91.2 kg)  01/05/16 195 lb (88.5 kg)    Physical Exam  Constitutional: He is oriented to person, place, and time. He appears well-developed and well-nourished. He is cooperative. No distress.  HENT:  Head: Normocephalic and atraumatic.  Eyes: EOM  are normal.  Neck: Normal range of motion. Neck supple. No tracheal deviation present. No thyromegaly present.  Cardiovascular: Normal rate, S1 normal, S2 normal and normal heart sounds.  Exam reveals no gallop.   No murmur heard. Pulses:      Dorsalis pedis pulses are 1+ on the right side, and 1+ on the left side.       Posterior tibial pulses are 1+ on the right side, and 1+ on the left side.  Pulmonary/Chest: Breath sounds normal. No respiratory distress. He has no wheezes.  Abdominal: Soft. Bowel sounds are normal. He exhibits no distension. There is no tenderness. There is no guarding and no CVA tenderness.  Musculoskeletal: He exhibits no edema.       Right shoulder: He exhibits no swelling and no deformity.  Neurological: He is alert and oriented to person, place, and time. He has normal strength and normal reflexes. No cranial nerve deficit or sensory deficit. Gait normal.  Skin: Skin is warm and dry. No rash noted. No cyanosis. Nails show no clubbing.  Psychiatric: He has a normal mood and affect. His speech is normal and behavior is normal. Judgment and thought content normal. Cognition and memory are normal.     CMP ( most recent) CMP     Component  Value Date/Time   NA 132 (L) 01/05/2016 0744   K 4.0 01/05/2016 0744   CL 96 (L) 01/05/2016 0744   CO2 29 01/05/2016 0744   GLUCOSE 326 (H) 01/05/2016 0744   BUN 23 (H) 01/05/2016 0744   CREATININE 1.27 (H) 01/05/2016 0744   CREATININE 1.25 10/23/2015 0723   CALCIUM 9.2 01/05/2016 0744   PROT 7.3 10/23/2015 0723   ALBUMIN 4.2 10/23/2015 0723   AST 30 10/23/2015 0723   ALT 41 10/23/2015 0723   ALKPHOS 96 10/23/2015 0723   BILITOT 0.6 10/23/2015 0723   GFRNONAA >60 01/05/2016 0744   GFRNONAA 64 10/23/2015 0723   GFRAA >60 01/05/2016 0744   GFRAA 74 10/23/2015 0723     Diabetic Labs (most recent): Lab Results  Component Value Date   HGBA1C 12.3 (H) 10/23/2015   HGBA1C 6.7 (H) 09/17/2014   HGBA1C 5.9 (H) 05/07/2014      Lipid Panel ( most recent) Lipid Panel     Component Value Date/Time   CHOL 299 (H) 10/23/2015 0723   TRIG 224 (H) 10/23/2015 0723   HDL 41 10/23/2015 0723   CHOLHDL 7.3 (H) 10/23/2015 0723   VLDL 45 (H) 10/23/2015 0723   LDLCALC 213 (H) 10/23/2015 0723      Assessment & Plan:   1. Diabetes mellitus type 2 in obese [facility])  - Patient has currently uncontrolled symptomatic type 2 DM since  55 years of age,  with most recent A1c of 12.3 %. Recent labs reviewed.   He is diabetes is complicated by obesity and patient remains at a high risk for more acute and chronic complications of diabetes which include CAD, CVA, CKD, retinopathy, and neuropathy. These are all discussed in detail with the patient.  - I have counseled the patient on diet management and weight loss, by adopting a carbohydrate restricted/protein rich diet.  - Suggestion is made for patient to avoid simple carbohydrates   from their diet including Cakes , Desserts, Ice Cream,  Soda (  diet and regular) , Sweet Tea , Candies,  Chips, Cookies, Artificial Sweeteners,   and "Sugar-free" Products . This will help patient to have stable blood glucose profile and potentially avoid unintended weight gain.  - I encouraged the patient to switch to  unprocessed or minimally processed complex starch and increased protein intake (animal or plant source), fruits, and vegetables.  - Patient is advised to stick to a routine mealtimes to eat 3 meals  a day and avoid unnecessary snacks ( to snack only to correct hypoglycemia).  - The patient will be scheduled with Norm Salt, RDN, CDE for individualized DM education.  - I have approached patient with the following individualized plan to manage diabetes and patient agrees:   -  He came with the blood glucose profile controlled to near target. He is tolerating  [place] 5mg .  - Based on his blood glucose readings he will not need insulin therapy for now.  -Patient is encouraged to  call clinic for blood glucose levels less than 70 or above 300 mg /dl.   -He does not tolerate Janumet due to GI intolerance. I have discontinued Janumet .  - I will increase Farxiga to 10 mg by mouth every morning. Side effects and precautions discussed with him.   - Patient will be considered for incretin therapy as appropriate next visit. - Patient specific target  A1c;  LDL, HDL, Triglycerides, and  Waist Circumference were discussed in detail.  2) BP/HTN: Controlled. Continue current  medications including ACEI/ARB. 3) Lipids/HPL:  uncontrolled,  continue statins. 4)  Weight/Diet: CDE Consult will be initiated , exercise, and detailed carbohydrates information provided.  5) Chronic Care/Health Maintenance:  -Patient is on ACEI/ARB and Statin medications and encouraged to continue to follow up with Ophthalmology, Podiatrist at least yearly or according to recommendations, and advised to   stay away from smoking. I have recommended yearly flu vaccine and pneumonia vaccination at least every 5 years; moderate intensity exercise for up to 150 minutes weekly; and  sleep for at least 7 hours a day.  - 25 minutes of time was spent on the care of this patient , 50% of which was applied for counseling on diabetes complications and their preventions.  - Patient to bring meter and  blood glucose logs during their next visit.   - I advised patient to maintain close follow up with Syliva Overman, MD for primary care needs.  Follow up plan: - Return in about 10 weeks (around 03/30/2016) for follow up with pre-visit labs.  Marquis Lunch, MD Phone: [phone]  Fax: [phone]   01/20/2016, 11:20 AM

## 2016-01-21 ENCOUNTER — Ambulatory Visit (HOSPITAL_COMMUNITY): Payer: Self-pay | Admitting: Psychiatry

## 2016-01-22 ENCOUNTER — Encounter: Payer: Self-pay | Attending: "Endocrinology | Admitting: Nutrition

## 2016-01-22 VITALS — Ht 68.0 in | Wt 195.2 lb

## 2016-01-22 DIAGNOSIS — Z6829 Body mass index (BMI) 29.0-29.9, adult: Secondary | ICD-10-CM | POA: Insufficient documentation

## 2016-01-22 DIAGNOSIS — E1165 Type 2 diabetes mellitus with hyperglycemia: Secondary | ICD-10-CM

## 2016-01-22 DIAGNOSIS — E118 Type 2 diabetes mellitus with unspecified complications: Secondary | ICD-10-CM

## 2016-01-22 DIAGNOSIS — Z713 Dietary counseling and surveillance: Secondary | ICD-10-CM | POA: Insufficient documentation

## 2016-01-22 DIAGNOSIS — E119 Type 2 diabetes mellitus without complications: Secondary | ICD-10-CM | POA: Insufficient documentation

## 2016-01-22 DIAGNOSIS — IMO0002 Reserved for concepts with insufficient information to code with codable children: Secondary | ICD-10-CM

## 2016-01-22 DIAGNOSIS — E669 Obesity, unspecified: Secondary | ICD-10-CM

## 2016-01-22 NOTE — Patient Instructions (Addendum)
Goal 1. Follow Plate Method 2. Eat three meals a day and don't skip meals 3. Drink only water 4. Exercise 30 minutes every day 5.  Eat out less.often and cook more at home. 6. Get A1C down to 8%

## 2016-01-22 NOTE — Progress Notes (Signed)
Diabetes Self-Management Education  Visit Type: First/Initial  Appt. Start Time: 0915 Appt. End Time: 1030  01/22/2016  [name], identified by name and date of birth, is a 55 y.o. male with a diagnosis of Diabetes: Type 2.   ASSESSMENT  Height 5\' 8"  (1.727 m), weight 195 lb 3.2 oz (88.5 kg). Body mass index is 29.68 kg/m.      Diabetes Self-Management Education - 01/22/16 0943      Visit Information   Visit Type First/Initial     Initial Visit   Diabetes Type Type 2   Are you taking your medications as prescribed? Yes   Date Diagnosed July 2017     Health Coping   How would you rate your overall health? Good     Psychosocial Assessment   Patient Belief/Attitude about Diabetes Motivated to manage diabetes   Self-care barriers Low literacy   Self-management support Doctor's office;Family   Other persons present Patient   Patient Concerns Nutrition/Meal planning;Healthy Lifestyle   Special Needs None;Simplified materials   Preferred Learning Style Hands on;Auditory   Learning Readiness Ready   How often do you need to have someone help you when you read instructions, pamphlets, or other written materials from your doctor or pharmacy? 5 - Always   What is the last grade level you completed in school? 9     Pre-Education Assessment   Patient understands the diabetes disease and treatment process. Needs Instruction   Patient understands incorporating nutritional management into lifestyle. Needs Instruction   Patient undertands incorporating physical activity into lifestyle. Needs Instruction   Patient understands using medications safely. Needs Instruction   Patient understands monitoring blood glucose, interpreting and using results Needs Instruction   Patient understands prevention, detection, and treatment of acute complications. Needs Instruction   Patient understands prevention, detection, and treatment of chronic complications. Needs Instruction   Patient  understands how to develop strategies to address psychosocial issues. Needs Instruction   Patient understands how to develop strategies to promote health/change behavior. Needs Instruction     Complications   Last HgB A1C per patient/outside source 12.45 %   How often do you check your blood sugar? 0 times/day (not testing)   Have you had a dilated eye exam in the past 12 months? No   Have you had a dental exam in the past 12 months? No   Are you checking your feet? No     Dietary Intake   Breakfast banana sandwich   Lunch 6" sand      Individualized Plan for Diabetes Self-Management Training:   Learning Objective:  Patient will have a greater understanding of diabetes self-management. Patient education plan is to attend individual and/or group sessions per assessed needs and concerns.   Plan:   Patient Instructions  Goal 1. Follow Plate Method 2. Eat three meals a day and don't skip meals 3. Drink only water 4. Exercise 30 minutes every day 5.  Eat out less.often and cook more at home.   Expected Outcomes:    Improved DM knowledge, reduced  Complications and better blood sugar control.  Education material provided: Living Well with Diabetes, A1C conversion sheet, Meal plan card, My Plate and Carbohydrate counting sheet  If problems or questions, patient to contact team via:  Phone and Email  Future DSME appointment:  1 month

## 2016-01-22 NOTE — Progress Notes (Signed)
Diabetes Self-Management Education  Visit Type: First/Initial  Appt. Start Time: 0930 Appt. End Time: 1030  02/12/2016  [name], identified by name and date of birth, is a 55 y.o. male with a diagnosis of Diabetes: Type 2.  Assessment:  Primary concerns today: Diabetes Type 2.   LIves with a friend. Eats out most meals- fast food or burger restaturants. On Farixa once a day.  Testing blood sugars. Needs to get meter from insurance to start testing. Eats 2-3 times per day. Drinks usually water or gatorade.Physical actvity: walks  2 a day.  He is not working due to depression. HIs wife isn't working due cancer.  Lost 10 lbs in the month from [place].  Diet is inadequate to meet his nutritional needs. Low in fresh fruits and vegetables, ASSESSMENT  Height 5\' 8"  (1.727 m), weight 195 lb 3.2 oz (88.5 kg). Body mass index is 29.68 kg/m.      Diabetes Self-Management Education - 02/12/16 1650      Visit Information   Visit Type First/Initial      Individualized Plan for Diabetes Self-Management Training:   Learning Objective:  Patient will have a greater understanding of diabetes self-management. Patient education plan is to attend individual and/or group sessions per assessed needs and concerns.   Plan:   Patient Instructions  Goal 1. Follow Plate Method 2. Eat three meals a day and don't skip meals 3. Drink only water 4. Exercise 30 minutes every day 5.  Eat out less.often and cook more at home. 6. Get A1C down to 8%   Expected Outcomes:  Demonstrated interest in learning. Expect positive outcomes  Education material provided: Living Well with Diabetes, Food label handouts, A1C conversion sheet, Meal plan card and My Plate  If problems or questions, patient to contact team via:  Phone and Email  Future DSME appointment: 4-6 wks

## 2016-01-25 ENCOUNTER — Other Ambulatory Visit: Payer: Self-pay | Admitting: Family Medicine

## 2016-01-26 ENCOUNTER — Encounter: Payer: Self-pay | Admitting: Family Medicine

## 2016-01-26 ENCOUNTER — Ambulatory Visit (INDEPENDENT_AMBULATORY_CARE_PROVIDER_SITE_OTHER): Payer: BLUE CROSS/BLUE SHIELD | Admitting: Family Medicine

## 2016-01-26 VITALS — BP 130/80 | HR 82 | Resp 16 | Ht 68.0 in | Wt 197.4 lb

## 2016-01-26 DIAGNOSIS — E669 Obesity, unspecified: Secondary | ICD-10-CM

## 2016-01-26 DIAGNOSIS — E119 Type 2 diabetes mellitus without complications: Secondary | ICD-10-CM

## 2016-01-26 DIAGNOSIS — F418 Other specified anxiety disorders: Secondary | ICD-10-CM | POA: Diagnosis not present

## 2016-01-26 DIAGNOSIS — I10 Essential (primary) hypertension: Secondary | ICD-10-CM | POA: Diagnosis not present

## 2016-01-26 DIAGNOSIS — E785 Hyperlipidemia, unspecified: Secondary | ICD-10-CM

## 2016-01-26 DIAGNOSIS — E1169 Type 2 diabetes mellitus with other specified complication: Secondary | ICD-10-CM

## 2016-01-26 DIAGNOSIS — E663 Overweight: Secondary | ICD-10-CM

## 2016-01-26 LAB — LIPID PANEL
CHOL/HDL RATIO: 3.6 ratio (ref <=5.0)
Cholesterol: 139 mg/dL (ref 125–200)
HDL: 39 mg/dL — AB (ref >=40)
LDL CALC: 84 mg/dL (ref <130)
TRIGLYCERIDES: 79 mg/dL (ref <150)
VLDL: 16 mg/dL (ref <30)

## 2016-01-26 LAB — HEPATIC FUNCTION PANEL
ALBUMIN: 3.9 g/dL (ref 3.6–5.1)
ALK PHOS: 94 U/L (ref 40–115)
ALT: 34 U/L (ref 9–46)
AST: 28 U/L (ref 10–35)
Bilirubin, Direct: 0.1 mg/dL (ref <=0.2)
Indirect Bilirubin: 0.5 mg/dL (ref 0.2–1.2)
TOTAL PROTEIN: 6.7 g/dL (ref 6.1–8.1)
Total Bilirubin: 0.6 mg/dL (ref 0.2–1.2)

## 2016-01-26 LAB — GLUCOSE, POCT (MANUAL RESULT ENTRY): POC GLUCOSE: 160 mg/dL — AB (ref 70–99)

## 2016-01-26 LAB — TSH: TSH: 2.35 m[IU]/L (ref 0.40–4.50)

## 2016-01-26 NOTE — Patient Instructions (Addendum)
F/u in 4.5 months, all if you need me sooner  Fasting lipid ,hepatic and tSH today  Work on blood sugar   Avoid [place], benadryl cream may be used if itching.  Blood pressure is good  Foot exam is good  Thank you  for choosing [facility]. We consider it a privelige to serve you.  Delivering excellent health care in a caring and  compassionate way is our goal.  Partnering with you,  so that together we can achieve this goal is our strategy.

## 2016-01-31 ENCOUNTER — Encounter: Payer: Self-pay | Admitting: Family Medicine

## 2016-01-31 NOTE — Assessment & Plan Note (Signed)
Hyperlipidemia:Low fat diet discussed and encouraged.   Lipid Panel  Lab Results  Component Value Date   CHOL 139 01/26/2016   HDL 39 (L) 01/26/2016   LDLCALC 84 01/26/2016   TRIG 79 01/26/2016   CHOLHDL 3.6 01/26/2016   Needs to increase exercise

## 2016-01-31 NOTE — Assessment & Plan Note (Signed)
Deteriorated. Patient re-educated about  the importance of commitment to a  minimum of 150 minutes of exercise per week.  The importance of healthy food choices with portion control discussed. Encouraged to start a food diary, count calories and to consider  joining a support group. Sample diet sheets offered. Goals set by the patient for the next several months.   Weight /BMI 01/26/2016 01/22/2016 01/13/2016  WEIGHT 197 lb 6.4 oz 195 lb 3.2 oz 195 lb  HEIGHT 5\' 8"  5\' 8"  5\' 8"   BMI 30.01 kg/m2 29.68 kg/m2 29.65 kg/m2  Some encounter information is confidential and restricted. Go to Review Flowsheets activity to see all data.

## 2016-01-31 NOTE — Assessment & Plan Note (Signed)
Controlled, no change in medication DASH diet and commitment to daily physical activity for a minimum of 30 minutes discussed and encouraged, as a part of hypertension management. The importance of attaining a healthy weight is also discussed.  BP/Weight 01/26/2016 01/22/2016 01/20/2016 01/13/2016 01/05/2016 01/05/2016 01/05/2016  Systolic BP 130 - 130 118 110 [id number] 131  Diastolic BP 80 - 81 64 80 77 79  Wt. (Lbs) 197.4 195.2 - 195 201 195 -  BMI 30.01 29.68 - 29.65 30.57 29.66 -  Some encounter information is confidential and restricted. Go to Review Flowsheets activity to see all data.

## 2016-01-31 NOTE — Progress Notes (Signed)
John [name]     MRN: [id number]      DOB: 1961-01-17   HPI Mr. Tauzin is here for follow up and re-evaluation of chronic medical conditions, medication management and review of any available recent lab and radiology data.  Preventive health is updated, specifically  Cancer screening and Immunization.   Saw endo last week, and reports improvement in blood sugar and visio, and energy level Still being treated by psych , not suicidal or homicidal, increased level of motivation and engagement, states his wife now unemployed and unable to work, The PT denies any adverse reactions to current medications since the last visit.  There are no new concerns.  There are no specific complaints  Denies polyuria, polydipsia, blurred vision , or hypoglycemic episodes.   ROS Denies recent fever or chills. Denies sinus pressure, nasal congestion, ear pain or sore throat. Denies chest congestion, productive cough or wheezing. Denies chest pains, palpitations and leg swelling Denies abdominal pain, nausea, vomiting,diarrhea or constipation.   Denies dysuria, frequency, hesitancy or incontinence. Denies joint pain, swelling and limitation in mobility. Denies headaches, seizures, numbness, or tingling. Denies uncontrolled epression, anxiety or insomnia. Denies skin break down or rash.   PE  BP 130/80   Pulse 82   Resp 16   Ht  (1.727 m)   Wt 197 lb 6.4 oz (89.5 kg)   SpO2 100%   BMI 30.01 kg/m   Patient alert and oriented and in no cardiopulmonary distress.  HEENT: No facial asymmetry, EOMI,   oropharynx pink and moist.  Neck supple no JVD, no mass.  Chest: Clear to auscultation bilaterally.  CVS: S1, S2 no murmurs, no S3.Regular rate.  ABD: Soft non tender.   Ext: No edema  MS: Adequate ROM spine, shoulders, hips and knees.  Skin: Intact, no ulcerations or rash noted.  Psych: Good eye contact, flat  affect. Memory intact not anxious or depressed appearing.  CNS: CN 2-12  intact, power,  normal throughout.no focal deficits noted.   Assessment & Plan  Essential hypertension Controlled, no change in medication DASH diet and commitment to daily physical activity for a minimum of 30 minutes discussed and encouraged, as a part of hypertension management. The importance of attaining a healthy weight is also discussed.  BP/Weight 01/26/2016 01/22/2016 01/20/2016 01/13/2016 01/05/2016 01/05/2016 01/05/2016  Systolic BP 130 - 130 118 110 829 131  Diastolic BP 80 - 81 64 80 77 79  Wt. (Lbs) 197.4 195.2 - 195 201 195 -  BMI 30.01 29.68 - 29.65 30.57 29.66 -  Some encounter information is confidential and restricted. Go to Review Flowsheets activity to see all data.       Hyperlipidemia Hyperlipidemia:Low fat diet discussed and encouraged.   Lipid Panel  Lab Results  Component Value Date   CHOL 139 01/26/2016   HDL 39 (L) 01/26/2016   LDLCALC 84 01/26/2016   TRIG 79 01/26/2016   CHOLHDL 3.6 01/26/2016   Needs to increase exercise   Depression with anxiety Controlled, management by psych as before   Overweight Deteriorated. Patient re-educated about  the importance of commitment to a  minimum of 150 minutes of exercise per week.  The importance of healthy food choices with portion control discussed. Encouraged to start a food diary, count calories and to consider  joining a support group. Sample diet sheets offered. Goals set by the patient for the next several months.   Weight /BMI 01/26/2016 01/22/2016 01/13/2016  WEIGHT 197 lb 6.4 oz  195 lb 3.2 oz 195 lb  HEIGHT 5\' 8"  5\' 8"  5\' 8"   BMI 30.01 kg/m2 29.68 kg/m2 29.65 kg/m2  Some encounter information is confidential and restricted. Go to Review Flowsheets activity to see all data.

## 2016-01-31 NOTE — Assessment & Plan Note (Signed)
Uncontrolled, managed by endo John [name] is reminded of the importance of commitment to daily physical activity for 30 minutes or more, as able and the need to limit carbohydrate intake to 30 to 60 grams per meal to help with blood sugar control.   The need to take medication as prescribed, test blood sugar as directed, and to call between visits if there is a concern that blood sugar is uncontrolled is also discussed.   John [name] is reminded of the importance of daily foot exam, annual eye examination, and good blood sugar, blood pressure and cholesterol control.  Diabetic Labs Latest Ref Rng & Units 01/26/2016 01/05/2016 12/31/2015 11/09/2015 10/23/2015  HbA1c <5.7 % - - - - 12.3(H)  Microalbumin Not estab mg/dL - - - 0.7 -  Micro/Creat Ratio <30 mcg/mg creat - - - 12 -  Chol 125 - 200 mg/dL 409139 - - - 811(B299(H)  HDL >=14>=40 mg/dL 78(G39(L) - - - 41  Calc LDL <130 mg/dL 84 - - - 956(O213(H)  Triglycerides <150 mg/dL 79 - - - 130(Q224(H)  Creatinine 0.61 - 1.24 mg/dL - 6.57(Q1.27(H) 4.69(G1.34(H) - 2.951.25   BP/Weight 01/26/2016 01/22/2016 01/20/2016 01/13/2016 01/05/2016 01/05/2016 01/05/2016  Systolic BP 130 - 130 118 110 284127 131  Diastolic BP 80 - 81 64 80 77 79  Wt. (Lbs) 197.4 195.2 - 195 201 195 -  BMI 30.01 29.68 - 29.65 30.57 29.66 -  Some encounter information is confidential and restricted. Go to Review Flowsheets activity to see all data.   Foot/eye exam completion dates 01/26/2016 01/19/2015  Foot Form Completion Done Done

## 2016-01-31 NOTE — Assessment & Plan Note (Signed)
Controlled, management by psych as before

## 2016-02-02 ENCOUNTER — Ambulatory Visit (INDEPENDENT_AMBULATORY_CARE_PROVIDER_SITE_OTHER): Payer: BLUE CROSS/BLUE SHIELD | Admitting: Urology

## 2016-02-02 DIAGNOSIS — N476 Balanoposthitis: Secondary | ICD-10-CM | POA: Diagnosis not present

## 2016-02-04 ENCOUNTER — Encounter (HOSPITAL_COMMUNITY): Payer: Self-pay

## 2016-02-04 ENCOUNTER — Ambulatory Visit (HOSPITAL_COMMUNITY): Payer: Self-pay | Admitting: Psychiatry

## 2016-02-12 ENCOUNTER — Encounter: Payer: Self-pay | Admitting: Nutrition

## 2016-02-26 ENCOUNTER — Other Ambulatory Visit (HOSPITAL_COMMUNITY): Payer: Self-pay | Admitting: Psychiatry

## 2016-02-29 ENCOUNTER — Telehealth (HOSPITAL_COMMUNITY): Payer: Self-pay | Admitting: *Deleted

## 2016-02-29 NOTE — Telephone Encounter (Signed)
Pt pharmacy requesting refills for pt Xanax 1 mg QD PRN . Pt medication last filled on 5-517 with 30 tabs 2 refills. Pt was last seen on 10-23-15.On 01-21-16. Pt was to return for f/u but provider was out of office on 01-21-16. Pt was rescheduled for 02-04-16 but no showed. Pt pharmacy number is [phone].

## 2016-03-01 ENCOUNTER — Telehealth (HOSPITAL_COMMUNITY): Payer: Self-pay | Admitting: *Deleted

## 2016-03-01 MED ORDER — ALPRAZOLAM 1 MG PO TABS: 1.0000 mg | ORAL_TABLET | Freq: Every day | ORAL | 0 refills | Status: DC | PRN

## 2016-03-01 NOTE — Telephone Encounter (Signed)
[facility]

## 2016-03-01 NOTE — Telephone Encounter (Signed)
You may call in one month supply 

## 2016-03-01 NOTE — Telephone Encounter (Signed)
Per Dr. Tenny Crawoss to call in refill for pt Xanax for only 1 mo supply. Called pt pharmacy [facility].

## 2016-03-02 ENCOUNTER — Telehealth: Payer: Self-pay | Admitting: Nutrition

## 2016-03-02 ENCOUNTER — Encounter: Payer: Self-pay | Attending: "Endocrinology | Admitting: Nutrition

## 2016-03-02 VITALS — Ht 68.0 in | Wt 198.0 lb

## 2016-03-02 DIAGNOSIS — E1165 Type 2 diabetes mellitus with hyperglycemia: Secondary | ICD-10-CM

## 2016-03-02 DIAGNOSIS — E119 Type 2 diabetes mellitus without complications: Secondary | ICD-10-CM | POA: Insufficient documentation

## 2016-03-02 DIAGNOSIS — Z6829 Body mass index (BMI) 29.0-29.9, adult: Secondary | ICD-10-CM | POA: Insufficient documentation

## 2016-03-02 DIAGNOSIS — IMO0002 Reserved for concepts with insufficient information to code with codable children: Secondary | ICD-10-CM

## 2016-03-02 DIAGNOSIS — E118 Type 2 diabetes mellitus with unspecified complications: Secondary | ICD-10-CM

## 2016-03-02 DIAGNOSIS — Z713 Dietary counseling and surveillance: Secondary | ICD-10-CM | POA: Insufficient documentation

## 2016-03-02 NOTE — Patient Instructions (Addendum)
Goals 1. [place] get food assistance and other food pantries. Go by Social Services to see about getting Medicaid 2. Increase walking to 2 miles per day 3. Eat three meals per day 4. Use grocery store flyer to plan meals out instead of eating fast foods 5. Drink only water 6. Consider get a [employer] meter and strips to test blood sugars.

## 2016-03-02 NOTE — Telephone Encounter (Signed)
TC to pt to see if he got by the food pantry. He said his wife needed him at home before he could get to the food pantry and will try to get by there tomorrow as well as [employer], [employer] Services for [employer] and [employer].

## 2016-03-02 NOTE — Progress Notes (Signed)
Diabetes Self-Management Education  Visit Type:  Follow-up  Appt. Start Time:1030 pt. End Time: 1100  03/02/2016  [name], identified by name and date of birth, is a 55 y.o. male with a diagnosis of Diabetes:  . He isn't working and applied for disability due to his depression. He has no income. His wife is sick with cancer and out of work. Referred to food pantry for food assistance and recommended to go to Social Services to seek Medicaid and [employer]. He can't afford testing strips. He has cut out sodas, juice and drinking only water and taking meds as prescribed.     ASSESSMENT  Height 5\' 8"  (1.727 m), weight 198 lb (89.8 kg). Body mass index is 30.11 kg/m.       Diabetes Self-Management Education - 03/02/16 1700      Health Coping   How would you rate your overall health? Good     Complications   Are you checking your feet? Yes   How many days per week are you checking your feet? 7     Dietary Intake   Breakfast Banana Sandwich   Lunch [place] garden, wate   Dinner left overs, Market researcherwater   Beverage(s) water     Exercise   Exercise Type Light (walking / raking leaves)   How many days per week to you exercise? --  3   How many minutes per day do you exercise? 30     Patient Education   Physical activity and exercise  Identified with patient nutritional and/or medication changes necessary with exercise.;Role of exercise on diabetes management, blood pressure control and cardiac health.   Medications Reviewed medication adjustment guidelines for hyperglycemia and sick days.   Chronic complications Nephropathy, what it is, prevention of, the use of ACE, ARB's and early detection of through urine microalbumia.;Identified and discussed with patient  current chronic complications;Lipid levels, blood glucose control and heart disease;Assessed and discussed foot care and prevention of foot problems   Psychosocial adjustment Role of stress on diabetes     Individualized  Goals (developed by patient)   Nutrition General guidelines for healthy choices and portions discussed;Adjust meds/carbs with exercise as discussed;Follow meal plan discussed   Medications take my medication as prescribed   Monitoring  test my blood glucose as discussed     Patient Self-Evaluation of Goals - Patient rates self as meeting previously set goals (% of time)   Nutrition 50 - 75 %   Physical Activity 25 - 50%   Medications >75%   Monitoring < 25%  He doesn't have insurance to pay for strips or meter.    Reducing Risk 25 - 50%     Post-Education Assessment   Patient understands the diabetes disease and treatment process. Demonstrates understanding / competency   Patient understands incorporating nutritional management into lifestyle. Needs Review   Patient undertands incorporating physical activity into lifestyle. Needs Review   Patient understands using medications safely. Demonstrates understanding / competency   Patient understands monitoring blood glucose, interpreting and using results Needs Review   Patient understands prevention, detection, and treatment of acute complications. Needs Review   Patient understands prevention, detection, and treatment of chronic complications. Needs Review   Patient understands how to develop strategies to address psychosocial issues. Needs Review   Patient understands how to develop strategies to promote health/change behavior. Needs Review     Outcomes   Program Status Completed      Learning Objective:  Patient will have a  greater understanding of diabetes self-management. Patient education plan is to attend individual and/or group sessions per assessed needs and concerns.   Plan:   Patient Instructions  Goals 1. Go by [employer] [place] to get food assistance and other food pantries. Go by Social Services to see about getting Medicaid 2. Increase walking to 2 miles per day 3. Eat three meals per day 4. Use grocery store flyer to  plan meals out instead of eating fast foods 5. Drink only water 6. Consider get a [employer] meter and strips to test blood sugars.    Expected Outcomes:  Demonstrated interest in learning. Expect positive outcomes  Education material provided: A1C conversion sheet, Meal plan card and My Plate  If problems or questions, patient to contact team via:  Phone and Email  Future DSME appointment: - 3-4 months  Referred to local food pantries and social services for [employer] and [employer].

## 2016-03-15 ENCOUNTER — Ambulatory Visit (INDEPENDENT_AMBULATORY_CARE_PROVIDER_SITE_OTHER): Payer: BLUE CROSS/BLUE SHIELD | Admitting: Urology

## 2016-03-15 DIAGNOSIS — N471 Phimosis: Secondary | ICD-10-CM

## 2016-03-31 ENCOUNTER — Ambulatory Visit: Payer: BLUE CROSS/BLUE SHIELD | Admitting: "Endocrinology

## 2016-04-03 ENCOUNTER — Other Ambulatory Visit (HOSPITAL_COMMUNITY): Payer: Self-pay | Admitting: Psychiatry

## 2016-04-04 ENCOUNTER — Telehealth (HOSPITAL_COMMUNITY): Payer: Self-pay | Admitting: *Deleted

## 2016-04-04 NOTE — Telephone Encounter (Signed)
You may call in 30 day supply but make sure he has appt scheduled

## 2016-04-04 NOTE — Telephone Encounter (Signed)
Pt pharmacy requesting refills for his Xanax. Pt medication last called in on 03-01-2016 with 30 tabs 0 refills. Pt no showed for previous appt on 02-04-2016. Pt had an appt for 01-21-2016 but had to be resch due to provider being out of office. Pt was last seen on 10-23-2015.

## 2016-04-05 ENCOUNTER — Telehealth (HOSPITAL_COMMUNITY): Payer: Self-pay | Admitting: *Deleted

## 2016-04-05 MED ORDER — ALPRAZOLAM 1 MG PO TABS: 1.0000 mg | ORAL_TABLET | Freq: Every day | ORAL | 0 refills | Status: DC | PRN

## 2016-04-05 NOTE — Telephone Encounter (Signed)
Pt resch f/u appt and medication called medication into pharmacy.

## 2016-04-05 NOTE — Telephone Encounter (Signed)
Per Dr. Tenny Crawoss to call in 30 days supply of pt Xanax with 0 refills. Called pt pharmacy and spoke [place] and she verbalized understanding. Called pt to sch appt with provider due to not f/u for a while. Pt did resch appt for f/u.

## 2016-04-06 ENCOUNTER — Encounter (HOSPITAL_COMMUNITY): Payer: Self-pay

## 2016-04-06 ENCOUNTER — Ambulatory Visit (HOSPITAL_COMMUNITY): Payer: Self-pay | Admitting: Psychiatry

## 2016-05-05 ENCOUNTER — Other Ambulatory Visit: Payer: Self-pay | Admitting: "Endocrinology

## 2016-05-05 ENCOUNTER — Other Ambulatory Visit (HOSPITAL_COMMUNITY): Payer: Self-pay | Admitting: Psychiatry

## 2016-05-19 ENCOUNTER — Other Ambulatory Visit (HOSPITAL_COMMUNITY): Payer: Self-pay | Admitting: Psychiatry

## 2016-05-24 ENCOUNTER — Ambulatory Visit: Payer: Self-pay | Admitting: Urology

## 2016-06-06 ENCOUNTER — Telehealth (HOSPITAL_COMMUNITY): Payer: Self-pay | Admitting: *Deleted

## 2016-06-06 ENCOUNTER — Encounter: Payer: Self-pay | Admitting: Family Medicine

## 2016-06-06 ENCOUNTER — Ambulatory Visit (INDEPENDENT_AMBULATORY_CARE_PROVIDER_SITE_OTHER): Payer: BLUE CROSS/BLUE SHIELD | Admitting: Family Medicine

## 2016-06-06 VITALS — BP 138/86 | HR 76 | Temp 97.7°F | Resp 18 | Ht 68.0 in | Wt 196.0 lb

## 2016-06-06 DIAGNOSIS — E7849 Other hyperlipidemia: Secondary | ICD-10-CM

## 2016-06-06 DIAGNOSIS — Z23 Encounter for immunization: Secondary | ICD-10-CM

## 2016-06-06 DIAGNOSIS — F418 Other specified anxiety disorders: Secondary | ICD-10-CM

## 2016-06-06 DIAGNOSIS — I10 Essential (primary) hypertension: Secondary | ICD-10-CM | POA: Diagnosis not present

## 2016-06-06 DIAGNOSIS — E663 Overweight: Secondary | ICD-10-CM | POA: Diagnosis not present

## 2016-06-06 DIAGNOSIS — E669 Obesity, unspecified: Secondary | ICD-10-CM | POA: Diagnosis not present

## 2016-06-06 DIAGNOSIS — E1169 Type 2 diabetes mellitus with other specified complication: Secondary | ICD-10-CM

## 2016-06-06 DIAGNOSIS — E784 Other hyperlipidemia: Secondary | ICD-10-CM

## 2016-06-06 LAB — GLUCOSE, POCT (MANUAL RESULT ENTRY): POC GLUCOSE: 335 mg/dL — AB (ref 70–99)

## 2016-06-06 MED ORDER — GLIPIZIDE 10 MG PO TABS
10.0000 mg | ORAL_TABLET | Freq: Two times a day (BID) | ORAL | 4 refills | Status: DC
Start: 2016-06-06 — End: 2016-12-29

## 2016-06-06 MED ORDER — TRIAMTERENE-HCTZ 75-50 MG PO TABS: 1.0000 | ORAL_TABLET | Freq: Every day | ORAL | 4 refills | Status: DC

## 2016-06-06 MED ORDER — INSULIN ASPART PROT & ASPART (70-30 MIX) 100 UNIT/ML ~~LOC~~ SUSP: 7.0000 [IU] | Freq: Once | SUBCUTANEOUS | Status: DC

## 2016-06-06 MED ORDER — LOVASTATIN 20 MG PO TABS: 20.0000 mg | ORAL_TABLET | Freq: Every day | ORAL | 4 refills | Status: DC

## 2016-06-06 MED ORDER — INSULIN ASPART 100 UNIT/ML ~~LOC~~ SOLN
7.0000 [IU] | Freq: Once | SUBCUTANEOUS | Status: AC
  Administered 2016-06-06: 7 [IU] via SUBCUTANEOUS

## 2016-06-06 MED ORDER — BENAZEPRIL HCL 10 MG PO TABS: 10.0000 mg | ORAL_TABLET | Freq: Every day | ORAL | 4 refills | Status: DC

## 2016-06-06 MED ORDER — INSULIN ASPART 100 UNIT/ML ~~LOC~~ SOLN: 7.0000 [IU] | Freq: Once | SUBCUTANEOUS | 99 refills | Status: DC

## 2016-06-06 NOTE — Telephone Encounter (Signed)
We can schedule if he has mens to pay for appt

## 2016-06-06 NOTE — Patient Instructions (Addendum)
F/u in 4 month, call if you need me sooner  Need hBA1C, and cmp as soon as possible.  NEW medication list is accurate, fILL medications on list and take as directed  Apply for farxiga through health dept  Insulin given in office today due to high blood sugar  PLS get flu vaccine there are places in community giving free flu vaccines, you need this  Please work on good  health habits so that your health will improve. 1. Commitment to daily physical activity for 30 to 60  minutes, if you are able to do this.  2. Commitment to wise food choices. Aim for half of your  food intake to be vegetable and fruit, one quarter starchy foods, and one quarter protein. Try to eat on a regular schedule  3 meals per day, snacking between meals should be limited to vegetables or fruits or small portions of nuts. 64 ounces of water per day is generally recommended, unless you have specific health conditions, like heart failure or kidney failure where you will need to limit fluid intake.  3. Commitment to sufficient and a  good quality of physical and mental rest daily, generally between 6 to 8 hours per day.  WITH PERSISTANCE AND PERSEVERANCE, THE IMPOSSIBLE , BECOMES THE NORM! Thanks for choosing [facility], we consider it a privelige to serve you.

## 2016-06-06 NOTE — Assessment & Plan Note (Signed)
Uncontrolled, needs to comply with medication DASH diet and commitment to daily physical activity for a minimum of 30 minutes discussed and encouraged, as a part of hypertension management. The importance of attaining a healthy weight is also discussed.  BP/Weight 06/06/2016 03/02/2016 01/26/2016 01/22/2016 01/20/2016 01/13/2016 01/05/2016  Systolic BP 138 - 130 - 130 118 110  Diastolic BP 86 - 80 - 81 64 80  Wt. (Lbs) 196.04 198 197.4 195.2 - 195 201  BMI 29.81 30.11 30.01 29.68 - 29.65 30.57  Some encounter information is confidential and restricted. Go to Review Flowsheets activity to see all data.

## 2016-06-06 NOTE — Telephone Encounter (Signed)
NO SHOW; 02/04/16; 04/06/16.  wants an appointment.   He does not have insurance.   He signed up for Medicaid.

## 2016-06-07 ENCOUNTER — Ambulatory Visit: Payer: Self-pay | Admitting: Family Medicine

## 2016-06-07 NOTE — Telephone Encounter (Signed)
lmtcb

## 2016-06-08 NOTE — Telephone Encounter (Signed)
Called pt and made f/u appt. Pt was reminded of no show policy and pt verbalized understanding.

## 2016-06-13 ENCOUNTER — Encounter: Payer: Self-pay | Admitting: Family Medicine

## 2016-06-13 NOTE — Assessment & Plan Note (Signed)
Managed by psychiatry, responding to therapy, continue same

## 2016-06-13 NOTE — Assessment & Plan Note (Signed)
Uncontrolled, elevated blood sugar in office, regular insulin administered. Needs to apply for help Updated lab needed and needs endo management

## 2016-06-13 NOTE — Assessment & Plan Note (Addendum)
Unchanged. Patient re-educated about  the importance of commitment to a  minimum of 150 minutes of exercise per week.  The importance of healthy food choices with portion control discussed. Encouraged to start a food diary, count calories and to consider  joining a support group. Sample diet sheets offered. Goals set by the patient for the next several months.   Weight /BMI 06/06/2016 03/02/2016 01/26/2016  WEIGHT 196 lb 0.6 oz 198 lb 197 lb 6.4 oz  HEIGHT 5\' 8"  5\' 8"  5\' 8"   BMI 29.81 kg/m2 30.11 kg/m2 30.01 kg/m2  Some encounter information is confidential and restricted. Go to Review Flowsheets activity to see all data.

## 2016-06-13 NOTE — Progress Notes (Signed)
John RoundGary K Haugan     MRN: [id number]      DOB: May 10, 1961   HPI John [name] is here for follow up and re-evaluation of chronic medical conditions, medication management and review of any available recent lab and radiology data.  Preventive health is updated, specifically  Cancer screening and Immunization. Still applying for medicaid,  Uninsured and unemployed, not taking prescription meds as no money to but them   There are no new concerns.  There are no specific complaints  Denies polyuria, polydipsia, blurred vision , or hypoglycemic episodes. Not testing sugar   ROS Denies recent fever or chills. Denies sinus pressure, nasal congestion, ear pain or sore throat. Denies chest congestion, productive cough or wheezing. Denies chest pains, palpitations and leg swelling Denies abdominal pain, nausea, vomiting,diarrhea or constipation.   Denies dysuria, frequency, hesitancy or incontinence. Denies joint pain, swelling and limitation in mobility. Denies headaches, seizures, numbness, or tingling. c/o depression, anxiety denies suicidal or homicidal ideation, denies  insomnia. Denies skin break down or rash.   PE  BP 138/86 (BP Location: Right Arm, Patient Position: Sitting, Cuff Size: Large)   Pulse 76   Temp 97.7 F (36.5 C) (Oral)   Resp 18   Ht 5\' 8"  (1.727 m)   Wt 196 lb 0.6 oz (88.9 kg)   SpO2 97%   BMI 29.81 kg/m   Patient alert and oriented and in no cardiopulmonary distress.  HEENT: No facial asymmetry, EOMI,   oropharynx pink and moist.  Neck supple no JVD, no mass.  Chest: Clear to auscultation bilaterally.  CVS: S1, S2 no murmurs, no S3.Regular rate.  ABD: Soft non tender.   Ext: No edema  MS: Adequate ROM spine, shoulders, hips and knees.  Skin: Intact, no ulcerations or rash noted.  Psych: Good eye contact, normal affect. Memory intact not anxious or depressed appearing.  CNS: CN 2-12 intact, power,  normal throughout.no focal deficits  noted.   Assessment & Plan Essential hypertension Uncontrolled, needs to comply with medication DASH diet and commitment to daily physical activity for a minimum of 30 minutes discussed and encouraged, as a part of hypertension management. The importance of attaining a healthy weight is also discussed.  BP/Weight 06/06/2016 03/02/2016 01/26/2016 01/22/2016 01/20/2016 01/13/2016 01/05/2016  Systolic BP 138 - 130 - 130 118 110  Diastolic BP 86 - 80 - 81 64 80  Wt. (Lbs) 196.04 198 197.4 195.2 - 195 201  BMI 29.81 30.11 30.01 29.68 - 29.65 30.57  Some encounter information is confidential and restricted. Go to Review Flowsheets activity to see all data.       Hyperlipidemia Hyperlipidemia:Low fat diet discussed and encouraged.   Lipid Panel  Lab Results  Component Value Date   CHOL 139 01/26/2016   HDL 39 (L) 01/26/2016   LDLCALC 84 01/26/2016   TRIG 79 01/26/2016   CHOLHDL 3.6 01/26/2016   Updated lab needed at/ before next visit.     Overweight Unchanged. Patient re-educated about  the importance of commitment to a  minimum of 150 minutes of exercise per week.  The importance of healthy food choices with portion control discussed. Encouraged to start a food diary, count calories and to consider  joining a support group. Sample diet sheets offered. Goals set by the patient for the next several months.   Weight /BMI 06/06/2016 03/02/2016 01/26/2016  WEIGHT 196 lb 0.6 oz 198 lb 197 lb 6.4 oz  HEIGHT 5\' 8"  5\' 8"  5\' 8"   BMI 29.81  kg/m2 30.11 kg/m2 30.01 kg/m2  Some encounter information is confidential and restricted. Go to Review Flowsheets activity to see all data.      Depression with anxiety Managed by psychiatry, responding to therapy, continue same  Diabetes mellitus type 2 in obese (HCC) Uncontrolled, elevated blood sugar in office, regular insulin administered. Needs to apply for help Updated lab needed and needs endo management

## 2016-06-13 NOTE — Assessment & Plan Note (Signed)
Hyperlipidemia:Low fat diet discussed and encouraged.   Lipid Panel  Lab Results  Component Value Date   CHOL 139 01/26/2016   HDL 39 (L) 01/26/2016   LDLCALC 84 01/26/2016   TRIG 79 01/26/2016   CHOLHDL 3.6 01/26/2016   Updated lab needed at/ before next visit.

## 2016-06-16 ENCOUNTER — Ambulatory Visit: Payer: Self-pay | Admitting: Nutrition

## 2016-06-16 ENCOUNTER — Telehealth: Payer: Self-pay | Admitting: Nutrition

## 2016-06-16 NOTE — Telephone Encounter (Signed)
VM for reminder of appt today at 11 am. Requested to bring meter and BS log.

## 2016-07-01 ENCOUNTER — Encounter (HOSPITAL_COMMUNITY): Payer: Self-pay | Admitting: Psychiatry

## 2016-07-01 ENCOUNTER — Ambulatory Visit (INDEPENDENT_AMBULATORY_CARE_PROVIDER_SITE_OTHER): Payer: Self-pay | Admitting: Psychiatry

## 2016-07-01 VITALS — BP 139/98 | HR 75 | Ht 68.0 in | Wt 194.2 lb

## 2016-07-01 DIAGNOSIS — Z7982 Long term (current) use of aspirin: Secondary | ICD-10-CM

## 2016-07-01 DIAGNOSIS — Z888 Allergy status to other drugs, medicaments and biological substances status: Secondary | ICD-10-CM

## 2016-07-01 DIAGNOSIS — Z9109 Other allergy status, other than to drugs and biological substances: Secondary | ICD-10-CM

## 2016-07-01 DIAGNOSIS — Z79899 Other long term (current) drug therapy: Secondary | ICD-10-CM

## 2016-07-01 DIAGNOSIS — F322 Major depressive disorder, single episode, severe without psychotic features: Secondary | ICD-10-CM

## 2016-07-01 DIAGNOSIS — Z8249 Family history of ischemic heart disease and other diseases of the circulatory system: Secondary | ICD-10-CM

## 2016-07-01 MED ORDER — TRAZODONE HCL 100 MG PO TABS: 100.0000 mg | ORAL_TABLET | Freq: Every day | ORAL | 2 refills | Status: DC

## 2016-07-01 MED ORDER — VENLAFAXINE HCL ER 150 MG PO CP24: 300.0000 mg | ORAL_CAPSULE | Freq: Every day | ORAL | 2 refills | Status: DC

## 2016-07-01 MED ORDER — ALPRAZOLAM 1 MG PO TABS: 1.0000 mg | ORAL_TABLET | Freq: Every day | ORAL | 2 refills | Status: DC | PRN
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
## 2016-09-08 ENCOUNTER — Other Ambulatory Visit (HOSPITAL_COMMUNITY): Payer: Self-pay | Admitting: Psychiatry

## 2016-09-18 ENCOUNTER — Encounter (HOSPITAL_COMMUNITY): Payer: Self-pay | Admitting: Cardiology

## 2016-09-18 ENCOUNTER — Emergency Department (HOSPITAL_COMMUNITY)
Admission: EM | Admit: 2016-09-18 | Discharge: 2016-09-18 | Disposition: A | Discharge status: Home / Self Care | Payer: Medicaid Other | Attending: Emergency Medicine | Admitting: Emergency Medicine

## 2016-09-18 DIAGNOSIS — E119 Type 2 diabetes mellitus without complications: Secondary | ICD-10-CM | POA: Diagnosis not present

## 2016-09-18 DIAGNOSIS — Z79899 Other long term (current) drug therapy: Secondary | ICD-10-CM | POA: Diagnosis not present

## 2016-09-18 DIAGNOSIS — I10 Essential (primary) hypertension: Secondary | ICD-10-CM

## 2016-09-18 DIAGNOSIS — Z7984 Long term (current) use of oral hypoglycemic drugs: Secondary | ICD-10-CM | POA: Diagnosis not present

## 2016-09-18 DIAGNOSIS — R358 Other polyuria: Secondary | ICD-10-CM | POA: Diagnosis present

## 2016-09-18 LAB — BASIC METABOLIC PANEL
Anion gap: 9 (ref 5–15)
BUN: 15 mg/dL (ref 6–20)
CO2: 25 mmol/L (ref 22–32)
CREATININE: 1.28 mg/dL — AB (ref 0.61–1.24)
Calcium: 9.8 mg/dL (ref 8.9–10.3)
Chloride: 94 mmol/L — ABNORMAL LOW (ref 101–111)
GFR calc Af Amer: >60 mL/min (ref >60)
GFR calc non Af Amer: >60 mL/min (ref >60)
GLUCOSE: 581 mg/dL — AB (ref 65–99)
Potassium: 4.1 mmol/L (ref 3.5–5.1)
SODIUM: 128 mmol/L — AB (ref 135–145)

## 2016-09-18 LAB — CBC
HCT: 38.8 % — ABNORMAL LOW (ref 39.0–52.0)
HEMOGLOBIN: 13.4 g/dL (ref 13.0–17.0)
MCH: 30 pg (ref 26.0–34.0)
MCHC: 34.5 g/dL (ref 30.0–36.0)
MCV: 86.8 fL (ref 78.0–100.0)
PLATELETS: 309 10*3/uL (ref 150–400)
RBC: 4.47 MIL/uL (ref 4.22–5.81)
RDW: 12.4 % (ref 11.5–15.5)
WBC: 6 10*3/uL (ref 4.0–10.5)

## 2016-09-18 LAB — CBG MONITORING, ED
GLUCOSE-CAPILLARY: 384 mg/dL — AB (ref 65–99)
Glucose-Capillary: 583 mg/dL (ref 65–99)

## 2016-09-18 MED ORDER — SODIUM CHLORIDE 0.9 % IV BOLUS (SEPSIS)
1000.0000 mL | Freq: Once | INTRAVENOUS | Status: AC
  Administered 2016-09-18: 1000 mL via INTRAVENOUS

## 2016-09-18 MED ORDER — METFORMIN HCL 500 MG PO TABS: 500.0000 mg | ORAL_TABLET | Freq: Two times a day (BID) | ORAL | 1 refills | Status: DC

## 2016-09-18 MED ORDER — INSULIN REGULAR BOLUS VIA INFUSION: 5.0000 [IU] | Freq: Once | INTRAVENOUS | Status: DC

## 2016-09-18 MED ORDER — INSULIN ASPART 100 UNIT/ML ~~LOC~~ SOLN
5.0000 [IU] | Freq: Once | SUBCUTANEOUS | Status: AC
  Administered 2016-09-18: 5 [IU] via INTRAVENOUS
  Filled 2016-09-18: qty 1

## 2016-09-18 MED ORDER — SODIUM CHLORIDE 0.9 % IV BOLUS (SEPSIS)
1000.0000 mL | Freq: Once | INTRAVENOUS | Status: AC
Start: 2016-09-18 — End: 2016-09-18
  Administered 2016-09-18: 1000 mL via INTRAVENOUS

## 2016-09-18 MED ORDER — LISINOPRIL-HYDROCHLOROTHIAZIDE 10-12.5 MG PO TABS: 1.0000 | ORAL_TABLET | Freq: Every day | ORAL | 1 refills | Status: DC

## 2016-09-18 NOTE — ED Provider Notes (Signed)
[facility]PT Provider Note   CSN: [id number] Arrival date & time: 09/18/16  5621  By signing my name below, I, Rosario Adie, attest that this documentation has been prepared under the direction and in the presence of Donnetta Hutching, MD. Electronically Signed: Rosario Adie, ED Scribe. 09/18/16. 9:50 AM.  History   Chief Complaint Chief Complaint  Patient presents with  . Hyperglycemia   The history is provided by the patient. No language interpreter was used.    HPI Comments: John [name] is a 56 y.o. male with a PMHx of DM, HLD, and HTN, who presents to the Emergency Department complaining of worsening hyperglycemia beginning this morning. Per pt, he recently lost his insurance coverage and has been unable to afford his PO Metformin medication which he typically takes BID. Pt also reports that he has been out of his daily HTN medications secondary to this issue as well. He has not been taking these medications in approximately two months. He also states that he took his CBG this morning at home and his monitor read as "high", prompting him to come into the ED. He notes associated polyuria and polydipsia over the past several days as well. He denies polyphagia, chest pain, shortness of breath, or any other associated symptoms.   Past Medical History:  Diagnosis Date  . Anxiety   . Depression 2007  . Diabetes mellitus without complication (HCC)   . Hyperlipidemia   . Hypertension   . Hypogonadism male    Patient Active Problem List   Diagnosis Date Noted  . Overweight 05/26/2013  . Insomnia 01/17/2013  . HYPOGONADISM 09/15/2009  . Diabetes mellitus type 2 in obese (HCC) 09/15/2009  . Hyperlipidemia 08/18/2009  . Depression with anxiety 08/18/2009  . Essential hypertension 08/18/2009   Past Surgical History:  Procedure Laterality Date  . COLONOSCOPY  01/20/2011   Procedure: COLONOSCOPY;  Surgeon: Arlyce Harman, MD;  Location: [facility];  Service:  Endoscopy;  Laterality: N/A;  . HERNIA REPAIR     ventral hernia repair     Home Medications    Prior to Admission medications   Medication Sig Start Date End Date Taking? Authorizing Provider  ALPRAZolam Prudy Feeler) 1 MG tablet Take 1 tablet (1 mg total) by mouth daily as needed for anxiety. 07/01/16 07/01/17 Yes Myrlene Broker, MD  benazepril (LOTENSIN) 10 MG tablet Take 1 tablet (10 mg total) by mouth daily. Patient not taking: Reported on 09/18/2016 06/06/16   Kerri Perches, MD  dapagliflozin propanediol (FARXIGA) 10 MG TABS tablet Take 10 mg by mouth daily. Patient not taking: Reported on 09/18/2016 01/20/16   Roma Kayser, MD  glipiZIDE (GLUCOTROL) 10 MG tablet Take 1 tablet (10 mg total) by mouth 2 (two) times daily before a meal. Patient not taking: Reported on 09/18/2016 06/06/16   Kerri Perches, MD  lisinopril-hydrochlorothiazide (ZESTORETIC) 10-12.5 MG tablet Take 1 tablet by mouth daily. 09/18/16   Donnetta Hutching, MD  lovastatin (MEVACOR) 20 MG tablet Take 1 tablet (20 mg total) by mouth at bedtime. Patient not taking: Reported on 09/18/2016 06/06/16   Kerri Perches, MD  metFORMIN (GLUCOPHAGE) 500 MG tablet Take 1 tablet (500 mg total) by mouth 2 (two) times daily with a meal. 09/18/16   Donnetta Hutching, MD  traZODone (DESYREL) 100 MG tablet Take 1 tablet (100 mg total) by mouth at bedtime. Patient not taking: Reported on 09/18/2016 07/01/16   Myrlene Broker, MD  venlafaxine XR (EFFEXOR XR) 150  MG 24 hr capsule Take 2 capsules (300 mg total) by mouth daily with breakfast. Patient not taking: Reported on 09/18/2016 07/01/16   Myrlene Broker, MD   Family History Family History  Problem Relation Age of Onset  . Heart failure Mother     living   . Hypertension Mother   . Colon cancer Neg Hx    Social History Social History  Substance Use Topics  . Smoking status: Never Smoker  . Smokeless tobacco: Never Used  . Alcohol use 0.6 oz/week    1 Cans of beer per week     Comment:  occasional beer socially.   Allergies   Janumet [sitagliptin-metformin hcl] and Poison oak extract [poison oak extract]  Review of Systems Review of Systems A complete 10 system review of systems was obtained and all systems are negative except as noted in the HPI and PMH.   Physical Exam Updated Vital Signs BP (!) 145/89   Pulse 75   Temp 97.8 F (36.6 C) (Oral)   Resp 16   Ht  (1.727 m)   Wt 194 lb (88 kg)   SpO2 99%   BMI 29.50 kg/m   Physical Exam  Constitutional: He is oriented to person, place, and time. He appears well-developed and well-nourished.  HENT:  Head: Normocephalic and atraumatic.  Eyes: Conjunctivae are normal.  Neck: Neck supple.  Cardiovascular: Normal rate, regular rhythm and normal heart sounds.  Exam reveals no gallop and no friction rub.   No murmur heard. Pulmonary/Chest: Effort normal and breath sounds normal. No respiratory distress. He has no wheezes. He has no rales.  Abdominal: Soft. Bowel sounds are normal.  Musculoskeletal: Normal range of motion.  Neurological: He is alert and oriented to person, place, and time.  Skin: Skin is warm and dry.  Psychiatric: He has a normal mood and affect. His behavior is normal.  Nursing note and vitals reviewed.   ED Treatments / Results  DIAGNOSTIC STUDIES: Oxygen Saturation is 98% on RA, normal by my interpretation.   COORDINATION OF CARE: 9:50 AM-Discussed next steps with pt including CBG monitoring, IV fluids, and basic blood work. Pt verbalized understanding and is agreeable with the plan.   Labs (all labs ordered are listed, but only abnormal results are displayed) Labs Reviewed  BASIC METABOLIC PANEL - Abnormal; Notable for the following:       Result Value   Sodium 128 (*)    Chloride 94 (*)    Glucose, Bld 581 (*)    Creatinine, Ser 1.28 (*)    All other components within normal limits  CBC - Abnormal; Notable for the following:    HCT 38.8 (*)    All other components within  normal limits  CBG MONITORING, ED - Abnormal; Notable for the following:    Glucose-Capillary 583 (*)    All other components within normal limits  CBG MONITORING, ED - Abnormal; Notable for the following:    Glucose-Capillary 384 (*)    All other components within normal limits  URINALYSIS, ROUTINE W REFLEX MICROSCOPIC   EKG  EKG Interpretation None      Radiology No results found.  Procedures Procedures   Medications Ordered in ED Medications  sodium chloride 0.9 % bolus 1,000 mL (0 mLs Intravenous Stopped 09/18/16 1129)  sodium chloride 0.9 % bolus 1,000 mL (0 mLs Intravenous Stopped 09/18/16 1129)  insulin aspart (novoLOG) injection 5 Units (5 Units Intravenous Given 09/18/16 1021)    Initial Impression / Assessment  and Plan / ED Course  I have reviewed the triage vital signs and the nursing notes.  Pertinent labs & imaging results that were available during my care of the patient were reviewed by me and considered in my medical decision making (see chart for details).     Patient is hemodynamically stable. He is not in DKA. He is alert and conversant. Discharge medications metformin 500 mg twice a day and lisinopril/hydrochlorothiazide 10/12.5 once daily.  Patient encouraged to go to the community primary care clinic in [place].  Final Clinical Impressions(s) / ED Diagnoses   Final diagnoses:  Type 2 diabetes mellitus without complication, without long-term current use of insulin (HCC)  Hypertension, unspecified type   New Prescriptions New Prescriptions   LISINOPRIL-HYDROCHLOROTHIAZIDE (ZESTORETIC) 10-12.5 MG TABLET    Take 1 tablet by mouth daily.   METFORMIN (GLUCOPHAGE) 500 MG TABLET    Take 1 tablet (500 mg total) by mouth 2 (two) times daily with a meal.   I personally performed the services described in this documentation, which was scribed in my presence. The recorded information has been reviewed and is accurate.      Donnetta Hutching, MD 09/18/16 1259

## 2016-09-18 NOTE — ED Triage Notes (Signed)
CBG meter read HI this morning.  States he is out of his PO diabetic medicine.  Only complaint is being thirsty.

## 2016-09-18 NOTE — Discharge Instructions (Signed)
Prescriptions for blood pressure medicine and diabetes medicine. These are $4 per month at [facility]. You must get primary care follow-up. Call the community health clinic at [phone].

## 2016-09-18 NOTE — ED Notes (Signed)
CRITICAL VALUE ALERT  Critical value received:  GLUCOSE 581  Date of notification:  09/18/16  Time of notification:  1043  Critical value read back:Yes.    Nurse who received alert:  Viviano Simas, RN  MD notified (1st page):  Adriana Simas

## 2016-09-25 ENCOUNTER — Other Ambulatory Visit (HOSPITAL_COMMUNITY): Payer: Self-pay | Admitting: Psychiatry

## 2016-09-26 ENCOUNTER — Other Ambulatory Visit (HOSPITAL_COMMUNITY): Payer: Self-pay | Admitting: Psychiatry

## 2016-09-29 ENCOUNTER — Encounter (HOSPITAL_COMMUNITY): Payer: Self-pay | Admitting: Psychiatry

## 2016-09-29 ENCOUNTER — Ambulatory Visit (INDEPENDENT_AMBULATORY_CARE_PROVIDER_SITE_OTHER): Payer: Self-pay | Admitting: Psychiatry

## 2016-09-29 VITALS — BP 148/110 | HR 89 | Ht 68.0 in | Wt 189.4 lb

## 2016-09-29 DIAGNOSIS — F322 Major depressive disorder, single episode, severe without psychotic features: Secondary | ICD-10-CM

## 2016-09-29 DIAGNOSIS — F4310 Post-traumatic stress disorder, unspecified: Secondary | ICD-10-CM

## 2016-09-29 DIAGNOSIS — Z79899 Other long term (current) drug therapy: Secondary | ICD-10-CM

## 2016-09-29 MED ORDER — ALPRAZOLAM 1 MG PO TABS: 1.0000 mg | ORAL_TABLET | Freq: Every day | ORAL | 2 refills | Status: DC | PRN

## 2016-09-29 MED ORDER — TRAZODONE HCL 100 MG PO TABS: 100.0000 mg | ORAL_TABLET | Freq: Every day | ORAL | 2 refills | Status: DC
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
## 2016-10-13 ENCOUNTER — Ambulatory Visit (INDEPENDENT_AMBULATORY_CARE_PROVIDER_SITE_OTHER): Payer: Self-pay | Admitting: Family Medicine

## 2016-10-13 ENCOUNTER — Encounter: Payer: Self-pay | Admitting: Family Medicine

## 2016-10-13 VITALS — BP 142/90 | HR 91 | Resp 16 | Ht 68.0 in | Wt 194.0 lb

## 2016-10-13 DIAGNOSIS — F418 Other specified anxiety disorders: Secondary | ICD-10-CM

## 2016-10-13 DIAGNOSIS — E1169 Type 2 diabetes mellitus with other specified complication: Secondary | ICD-10-CM

## 2016-10-13 DIAGNOSIS — E7849 Other hyperlipidemia: Secondary | ICD-10-CM

## 2016-10-13 DIAGNOSIS — I10 Essential (primary) hypertension: Secondary | ICD-10-CM

## 2016-10-13 DIAGNOSIS — E784 Other hyperlipidemia: Secondary | ICD-10-CM

## 2016-10-13 DIAGNOSIS — E669 Obesity, unspecified: Secondary | ICD-10-CM

## 2016-10-13 LAB — GLUCOSE, POCT (MANUAL RESULT ENTRY): POC GLUCOSE: 324 mg/dL — AB (ref 70–99)

## 2016-10-13 MED ORDER — INSULIN ASPART 100 UNIT/ML ~~LOC~~ SOLN
7.0000 [IU] | Freq: Once | SUBCUTANEOUS | Status: AC
  Administered 2016-10-13: 7 [IU] via SUBCUTANEOUS

## 2016-10-13 NOTE — Patient Instructions (Addendum)
You need to schedule an appointment at the health department  For your HEALTH CARE AS SOON AS POSSIBLE  YOU HAVE EXTREMELY HIGH AND UNCONTROLLED BLOOD SUGAR AND YOU NEED TO GET THIS CONTROLLED. IT IS VERY IMPORTANT TO BE ABLE TO GET BLOOD TESTS DONE TO MONITOR AND IMPROVE YOUR HEALTH  AS SOON AS YOU ARE ABLE TO GET ANY TYPE OF INSURANCE I WILL BE MORE THAN HAPPY TO TAKE CARE OF YOUR HEALTH CARE NEEDS, SO CALL BACK WHEN THIS OCCURS

## 2016-10-15 NOTE — Progress Notes (Signed)
John [name]     MRN: [id number]      DOB: [birth year]   HPI John [name] is here for follow up and re-evaluation of chronic medical conditions. He unfortunately remains uninsured, with no ability to pay for the health care he needs as an uncontrolled diabetic. I His FBG at the visit is 324. I explain respectfully the need for him to enrol at the health dept where he can have labs drawn and access to medication and Professional health services until he has some health insurance coverage . Being an uncontrolled diabetic, it is impossible to safely prescribe medication without access to blood work to monitor response to treatment and also his liver and kidney function. He understands and states he will follow through. I also called his wife and explained this to her , and assured them both that  I will be more than happy to resume care for them both once they get some medical insurance coverage again, I have also asked our social worker to help if able  His medications are reviewed. No scripts are provided by me at this visit  ROS Denies recent fever or chills. c/o fatigue, excess thirst and dry mouth Denies sinus pressure, nasal congestion, ear pain or sore throat. Denies chest congestion, productive cough or wheezing. Denies chest pains, palpitations and leg swelling Denies abdominal pain, nausea, vomiting,diarrhea or constipation.   Denies dysuria, frequency, hesitancy or incontinence. Denies joint pain, swelling and limitation in mobility. Denies headaches, seizures, numbness, or tingling. Denies  Uncontrolled epression, anxiety or insomnia. Denies skin break down or rash.   PE  BP (!) 142/90   Pulse 91   Resp 16   Ht  (1.727 m)   Wt 194 lb (88 kg)   SpO2 97%   BMI 29.50 kg/m   Patient alert and oriented and in no cardiopulmonary distress.  HEENT: No facial asymmetry, EOMI,   oropharynx pink and moist.  Neck supple no JVD, no mass.  Chest: Clear to auscultation  bilaterally.  CVS: S1, S2 no murmurs, no S3.Regular rate.  ABD: Soft non tender.   Ext: No edema  MS: Adequate ROM spine, shoulders, hips and knees.  Skin: Intact, no ulcerations or rash noted.   Essential hypertension Uncontrolled , needs to fill script and takes medication as prescribed, this is on the 4$ list of medication DASH diet and commitment to daily physical activity for a minimum of 30 minutes discussed and encouraged, as a part of hypertension management. The importance of attaining a healthy weight is also discussed.  BP/Weight 10/13/2016 09/18/2016 06/06/2016 03/02/2016 01/26/2016 01/22/2016 01/20/2016  Systolic BP 142 169 138 - 130 - 130  Diastolic BP 90 104 86 - 80 - 81  Wt. (Lbs) 194 194 196.04 198 197.4 195.2 -  BMI 29.5 29.5 29.81 30.11 30.01 29.68 -  Some encounter information is confidential and restricted. Go to Review Flowsheets activity to see all data.       Diabetes mellitus type 2 in obese (HCC) Uncontrolled at high risk for multiple complications, needs to establish at the health dept until he has some form of insurance as he is now unable to get labs drawn to monitor his condition which puts him at unacceptable risk. I have discussed this at length with him at this visit , also with his wife on the phone, and I have also sent a message asking a social worker to reach out to him to provide any assistance possible to help  to navigate the system. Hopefully he will get some form of public health insurance in the near future  Depression with anxiety Has been followed by [facility] , not  Suicidal or homicidal. Has medication prescribed by psychiatrist currently, but again , I recommend he seek continuity of care through the Public health system  Hyperlipidemia No t currently taking the statin on his list, as an uncontrolled diabetic he remains at high risk for vascular disease hence statin therapy would be recommended, lab monitoring needed for this to be done  safely

## 2016-10-15 NOTE — Assessment & Plan Note (Signed)
Uncontrolled , needs to fill script and takes medication as prescribed, this is on the 4$ list of medication DASH diet and commitment to daily physical activity for a minimum of 30 minutes discussed and encouraged, as a part of hypertension management. The importance of attaining a healthy weight is also discussed.  BP/Weight 10/13/2016 09/18/2016 06/06/2016 03/02/2016 01/26/2016 01/22/2016 01/20/2016  Systolic BP 142 169 138 - 130 - 130  Diastolic BP 90 104 86 - 80 - 81  Wt. (Lbs) 194 194 196.04 198 197.4 195.2 -  BMI 29.5 29.5 29.81 30.11 30.01 29.68 -  Some encounter information is confidential and restricted. Go to Review Flowsheets activity to see all data.

## 2016-10-15 NOTE — Assessment & Plan Note (Signed)
Has been followed by [facility] , not  Suicidal or homicidal. Has medication prescribed by psychiatrist currently, but again , I recommend he seek continuity of care through the Public health system

## 2016-10-15 NOTE — Assessment & Plan Note (Signed)
Uncontrolled at high risk for multiple complications, needs to establish at the health dept until he has some form of insurance as he is now unable to get labs drawn to monitor his condition which puts him at unacceptable risk. I have discussed this at length with him at this visit , also with his wife on the phone, and I have also sent a message asking a social worker to reach out to him to provide any assistance possible to help to navigate the system. Hopefully he will get some form of public health insurance in the near future

## 2016-10-15 NOTE — Assessment & Plan Note (Signed)
No t currently taking the statin on his list, as an uncontrolled diabetic he remains at high risk for vascular disease hence statin therapy would be recommended, lab monitoring needed for this to be done safely

## 2016-10-19 ENCOUNTER — Encounter: Payer: Self-pay | Admitting: Family Medicine

## 2016-11-02 ENCOUNTER — Telehealth: Payer: Self-pay | Admitting: Family Medicine

## 2016-11-02 NOTE — Telephone Encounter (Signed)
Patient is requesting correspondence that he can provide for a disability hearing.  I advise him that we will [facility] and will provide at that time

## 2016-11-02 NOTE — Telephone Encounter (Signed)
agree

## 2016-12-19 ENCOUNTER — Encounter (HOSPITAL_COMMUNITY): Payer: Self-pay | Admitting: Psychiatry

## 2016-12-19 ENCOUNTER — Ambulatory Visit (INDEPENDENT_AMBULATORY_CARE_PROVIDER_SITE_OTHER): Payer: Self-pay | Admitting: Psychiatry

## 2016-12-19 VITALS — BP 144/102 | HR 77 | Ht 68.0 in | Wt 197.0 lb

## 2016-12-19 DIAGNOSIS — Z888 Allergy status to other drugs, medicaments and biological substances status: Secondary | ICD-10-CM

## 2016-12-19 DIAGNOSIS — Z79899 Other long term (current) drug therapy: Secondary | ICD-10-CM

## 2016-12-19 DIAGNOSIS — F4310 Post-traumatic stress disorder, unspecified: Secondary | ICD-10-CM

## 2016-12-19 DIAGNOSIS — F322 Major depressive disorder, single episode, severe without psychotic features: Secondary | ICD-10-CM

## 2016-12-19 DIAGNOSIS — F329 Major depressive disorder, single episode, unspecified: Secondary | ICD-10-CM

## 2016-12-19 DIAGNOSIS — R030 Elevated blood-pressure reading, without diagnosis of hypertension: Secondary | ICD-10-CM

## 2016-12-19 DIAGNOSIS — F419 Anxiety disorder, unspecified: Secondary | ICD-10-CM

## 2016-12-19 DIAGNOSIS — Z7984 Long term (current) use of oral hypoglycemic drugs: Secondary | ICD-10-CM

## 2016-12-19 DIAGNOSIS — G479 Sleep disorder, unspecified: Secondary | ICD-10-CM

## 2016-12-19 MED ORDER — ALPRAZOLAM 1 MG PO TABS: 1.0000 mg | ORAL_TABLET | Freq: Every day | ORAL | 2 refills | Status: DC | PRN

## 2016-12-19 MED ORDER — TRAZODONE HCL 100 MG PO TABS: 100.0000 mg | ORAL_TABLET | Freq: Every day | ORAL | 2 refills | Status: DC
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
## 2016-12-28 ENCOUNTER — Emergency Department (HOSPITAL_COMMUNITY)
Admission: EM | Admit: 2016-12-28 | Discharge: 2016-12-28 | Disposition: A | Discharge status: Home / Self Care | Payer: Medicaid Other | Attending: Emergency Medicine | Admitting: Emergency Medicine

## 2016-12-28 ENCOUNTER — Encounter (HOSPITAL_COMMUNITY): Payer: Self-pay | Admitting: Emergency Medicine

## 2016-12-28 DIAGNOSIS — I10 Essential (primary) hypertension: Secondary | ICD-10-CM | POA: Diagnosis not present

## 2016-12-28 DIAGNOSIS — Z139 Encounter for screening, unspecified: Secondary | ICD-10-CM

## 2016-12-28 DIAGNOSIS — R739 Hyperglycemia, unspecified: Secondary | ICD-10-CM

## 2016-12-28 DIAGNOSIS — Z7984 Long term (current) use of oral hypoglycemic drugs: Secondary | ICD-10-CM | POA: Diagnosis not present

## 2016-12-28 DIAGNOSIS — E1165 Type 2 diabetes mellitus with hyperglycemia: Secondary | ICD-10-CM | POA: Insufficient documentation

## 2016-12-28 LAB — URINALYSIS, ROUTINE W REFLEX MICROSCOPIC
Bacteria, UA: NONE SEEN
Bilirubin Urine: NEGATIVE
Glucose, UA: >=500 mg/dL — AB
Hgb urine dipstick: NEGATIVE
Ketones, ur: 5 mg/dL — AB
Leukocytes, UA: NEGATIVE
Nitrite: NEGATIVE
Protein, ur: NEGATIVE mg/dL
Specific Gravity, Urine: 1.031 — ABNORMAL HIGH (ref 1.005–1.030)
Squamous Epithelial / HPF: NONE SEEN
pH: 5 (ref 5.0–8.0)

## 2016-12-28 LAB — BASIC METABOLIC PANEL
Anion gap: 11 (ref 5–15)
BUN: 14 mg/dL (ref 6–20)
CO2: 27 mmol/L (ref 22–32)
Calcium: 10 mg/dL (ref 8.9–10.3)
Chloride: 96 mmol/L — ABNORMAL LOW (ref 101–111)
Creatinine, Ser: 1.22 mg/dL (ref 0.61–1.24)
GFR calc Af Amer: >60 mL/min (ref >60)
GFR calc non Af Amer: >60 mL/min (ref >60)
Glucose, Bld: 366 mg/dL — ABNORMAL HIGH (ref 65–99)
Potassium: 3.9 mmol/L (ref 3.5–5.1)
Sodium: 134 mmol/L — ABNORMAL LOW (ref 135–145)

## 2016-12-28 LAB — CBC
HCT: 41.7 % (ref 39.0–52.0)
Hemoglobin: 14.7 g/dL (ref 13.0–17.0)
MCH: 30.6 pg (ref 26.0–34.0)
MCHC: 35.3 g/dL (ref 30.0–36.0)
MCV: 86.7 fL (ref 78.0–100.0)
Platelets: 262 10*3/uL (ref 150–400)
RBC: 4.81 MIL/uL (ref 4.22–5.81)
RDW: 12.1 % (ref 11.5–15.5)
WBC: 7.9 10*3/uL (ref 4.0–10.5)

## 2016-12-28 LAB — CBG MONITORING, ED: Glucose-Capillary: 354 mg/dL — ABNORMAL HIGH (ref 65–99)

## 2016-12-28 LAB — POCT GLYCOSYLATED HEMOGLOBIN (HGB A1C)

## 2016-12-28 LAB — GLUCOSE, POCT (MANUAL RESULT ENTRY): POC GLUCOSE: 440 mg/dL — AB (ref 70–99)

## 2016-12-28 MED ORDER — HYDROCHLOROTHIAZIDE 25 MG PO TABS: 25.0000 mg | ORAL_TABLET | Freq: Every day | ORAL | 0 refills | Status: DC

## 2016-12-28 MED ORDER — GLIPIZIDE 10 MG PO TABS: 10.0000 mg | ORAL_TABLET | Freq: Two times a day (BID) | ORAL | 0 refills | Status: DC

## 2016-12-28 NOTE — ED Triage Notes (Signed)
Patient states that he was sent to ED by his primary care provider due to hypertension 182/101 and hyperglycemia CBG 440.  [name] states that he has not been able to get medication due to not having insurance. He describes fatigue x 2 weeks.

## 2016-12-28 NOTE — Congregational Nurse Program (Signed)
Congregational Nurse Program Note  Date of Encounter: 12/28/2016  Past Medical History: Past Medical History:  Diagnosis Date  . Anxiety   . Depression 2007  . Diabetes mellitus without complication (HCC)   . Hyperlipidemia   . Hypertension   . Hypogonadism male     Encounter Details:     CNP Questionnaire - 12/28/16 1641      Patient Demographics   Is this a new or existing patient? New   Patient is considered a/an Not Applicable   Race African-American/Black     Patient Assistance   Location of Patient Assistance [facility]   Patient's financial/insurance status Low Income;Self-Pay (Uninsured)   Uninsured Patient ([place] Card/Care Connects) Yes   Interventions Counseled to make appt. with provider;Assisted patient in making appt.;Referred to ED/Urgent Care   Patient referred to apply for the following financial assistance [facility]   Food insecurities addressed Referred to food bank or resource   Transportation assistance No   Assistance securing medications No   Educational health offerings Behavioral health;Navigating the healthcare system;Hypertension;Chronic disease;Diabetes     Encounter Details   Primary purpose of visit Chronic Illness/Condition Visit;Navigating the Healthcare System   Was an Emergency Department visit averted? No   Does patient have a medical provider? No   Patient referred to Area Agency;Establish PCP;Clinic;Emergency Department   Was a mental health screening completed? (GAINS tool) No   Does patient have dental issues? No   Does patient have vision issues? Yes   Was a vision referral made? No   Does your patient have an abnormal blood pressure today? Yes   Since previous encounter, have you referred patient for abnormal blood pressure that resulted in a new diagnosis or medication change? No   Does your patient have an abnormal blood glucose today? Yes   Since previous encounter, have you referred patient for abnormal  blood glucose that resulted in a new diagnosis or medication change? No   Was there a life-saving intervention made? No     New client to [employer] today. Client here because he is unable to continue with his previous medical provider and has been without medications for months now. Client with history of diabetes, hypertension and increased cholesterol. Client states he was with Dr. Lodema Hong and last seen in April 2018. He states he was unable to afford his labwork and she could no longer prescribe his medications.   Current Medical History: Depression/ Anxiety : see Dr. Tenny Craw Diabetes Hyperlipidemia Hypertension  Current medications: Xanax 1 mg by mouth daily as needed (Dr Tenny Craw) Lovastatin 1 tablet 20 mg by mouth at bedtime *unsure of medications other than xanax and trazodone* Trazodone 100 mg 1 tablet by mouth at bedtime  Alert and oriented to person and place, accompanied by sister in law today. Client states he was seeing Dr. Lodema Hong, but can no longer pay to go there and has no insurance or income. He states he and his wife have applied for disability and awaiting hearings. Client states he has not taken any medication for blood pressure or diabetes in "months".  He has seen Dr. Tenny Craw this month for his mental health , but states he is not sure how long he can go there. He states the only medications he has at home now are his xanax and trazodone. Complains today of excessive thirst, hunger and urination. He states he is having blurry vision and light-headedness. He denies headaches, facial weakness or extremity weakness. Denies chest  pains or shortness of breath.  Glucose random today 440 : A1C checked today and reading of >14% Blood pressures left arm 182/101 sitting and right arm 172/92 sitting pulse regular 90 and 92  Discussed with patient in great detail along with his sister in law Darl PikesSusan his options for primary medical care and [facility], referral made and  appointment secured for 12/29/16 at 10:45 am  Also discussed risk of his high blood sugar and high blood pressure today and the risk of stroke, heart attack and death. RN discussed need for immediate evaluation and treatment in the Emergency room today then follow up with primary care provider. Client understands and is willing to proceed to the emergency room today for treatment.  Will follow up with client after free clinic appointment tomorrow.  Referral also made to Social Worker [employer] Stafford for needs assessment and possible new mental health referral for 01/03/17 [facility] 2[facility]30 [facility]  Referral also made to the [employer] food [employer] to supplement their food stamps, hours and times given to client.

## 2016-12-28 NOTE — Discharge Instructions (Signed)
Glipizide (diabetes medication) and hydrochlorothiazide (blood pressure medication) [facility] many other [facility] for $10 for a 3 month supply. Please also see attahced resources.

## 2016-12-28 NOTE — ED Provider Notes (Signed)
[facility]-EMERGENCY DEPT Provider Note   CSN: [id number] Arrival date & time: 12/28/16  1538  By signing my name below, I, Linna Darner, attest that this documentation has been prepared under the direction and in the presence of physician practitioner, Raeford Razor, MD. Electronically Signed: Linna Darner, Scribe. 12/28/2016. 5:22 PM.  History   Chief Complaint Chief Complaint  Patient presents with  . Hypertension  . Hyperglycemia   The history is provided by the patient. No language interpreter was used.    HPI Comments: John [name] is a 56 y.o. male with PMHx including HTN, HLD, and DM who presents to the Emergency Department for evaluation of persistent fatigue and global weakness secondary to hyperglycemia for about three months. Patient is currently without health insurance or income and has been unable to see his PCP since last April as a result. He has been without his medications for DM and HTN over this time span, but does have his psychiatric medications at home. In addition to fatigue and weakness he reports urinary frequency, polydipsia, mild nausea without vomiting, blurry vision, and a weight loss of 10 pounds over the last three months. He was [facility] at the [facility] earlier today; his BP was measured at 172/92 and his CBG was 440. He was subsequently advised to come to the ED for evaluation and refills of his needed medications. Patient denies headaches, chest pain, dyspnea, numbness/tingling, or any other associated symptoms.  Psychiatrist: Dr. Tenny Craw PCP: Dr. Lodema Hong  Past Medical History:  Diagnosis Date  . Anxiety   . Depression 2007  . Diabetes mellitus without complication (HCC)   . Hyperlipidemia   . Hypertension   . Hypogonadism male     Patient Active Problem List   Diagnosis Date Noted  . Overweight 05/26/2013  . Insomnia 01/17/2013  . HYPOGONADISM 09/15/2009  . Diabetes mellitus type 2 in obese (HCC) 09/15/2009  .  Hyperlipidemia 08/18/2009  . Depression with anxiety 08/18/2009  . Essential hypertension 08/18/2009    Past Surgical History:  Procedure Laterality Date  . COLONOSCOPY  01/20/2011   Procedure: COLONOSCOPY;  Surgeon: Arlyce Harman, MD;  Location: [facility] ENDO SUITE;  Service: Endoscopy;  Laterality: N/A;  . HERNIA REPAIR     ventral hernia repair        Home Medications    Prior to Admission medications   Medication Sig Start Date End Date Taking? Authorizing Provider  ALPRAZolam Prudy Feeler) 1 MG tablet Take 1 tablet (1 mg total) by mouth daily as needed for anxiety. 12/19/16 12/19/17  Myrlene Broker, MD  glipiZIDE (GLUCOTROL) 10 MG tablet Take 1 tablet (10 mg total) by mouth 2 (two) times daily before a meal. 06/06/16   Kerri Perches, MD  lovastatin (MEVACOR) 20 MG tablet Take 1 tablet (20 mg total) by mouth at bedtime. 06/06/16   Kerri Perches, MD  metFORMIN (GLUCOPHAGE) 500 MG tablet Take 1 tablet (500 mg total) by mouth 2 (two) times daily with a meal. 09/18/16   Donnetta Hutching, MD  traZODone (DESYREL) 100 MG tablet Take 1 tablet (100 mg total) by mouth at bedtime. 12/19/16   Myrlene Broker, MD    Family History Family History  Problem Relation Age of Onset  . Heart failure Mother        living   . Hypertension Mother   . Colon cancer Neg Hx     Social History Social History  Substance Use Topics  . Smoking status: Never  Smoker  . Smokeless tobacco: Never Used  . Alcohol use 0.6 oz/week    1 Cans of beer per week     Comment: occasional beer socially.     Allergies   Janumet [sitagliptin-metformin hcl] and Poison oak extract [poison oak extract]   Review of Systems Review of Systems  All other systems reviewed and are negative.  Physical Exam Updated Vital Signs BP (!) 148/94   Pulse 78   Temp 98.6 F (37 C) (Oral)   Resp 19   Ht 5\' 8"  (1.727 m)   Wt 197 lb (89.4 kg)   SpO2 98%   BMI 29.95 kg/m   Physical Exam  Constitutional: He appears  well-developed and well-nourished.  HENT:  Head: Normocephalic.  Right Ear: External ear normal.  Left Ear: External ear normal.  Nose: Nose normal.  Eyes: Conjunctivae are normal. Right eye exhibits no discharge. Left eye exhibits no discharge.  Neck: Normal range of motion.  Cardiovascular: Normal rate, regular rhythm and normal heart sounds.   No murmur heard. Pulmonary/Chest: Effort normal and breath sounds normal. No respiratory distress. He has no wheezes. He has no rales.  Abdominal: Soft. There is no tenderness. There is no rebound and no guarding.  Musculoskeletal: Normal range of motion. He exhibits no edema or tenderness.  Neurological: He is alert. No cranial nerve deficit. Coordination normal.  Skin: Skin is warm and dry. No rash noted. No erythema. No pallor.  Psychiatric: He has a normal mood and affect. His behavior is normal.  Nursing note and vitals reviewed.  ED Treatments / Results  Labs (all labs ordered are listed, but only abnormal results are displayed) Labs Reviewed  BASIC METABOLIC PANEL - Abnormal; Notable for the following:       Result Value   Sodium 134 (*)    Chloride 96 (*)    Glucose, Bld 366 (*)    All other components within normal limits  URINALYSIS, ROUTINE W REFLEX MICROSCOPIC - Abnormal; Notable for the following:    Specific Gravity, Urine 1.031 (*)    Glucose, UA >=500 (*)    Ketones, ur 5 (*)    All other components within normal limits  CBG MONITORING, ED - Abnormal; Notable for the following:    Glucose-Capillary 354 (*)    All other components within normal limits  CBC    EKG  EKG Interpretation None       Radiology No results found.  Procedures Procedures (including critical care time)  DIAGNOSTIC STUDIES: Oxygen Saturation is 99% on RA, normal by my interpretation.    COORDINATION OF CARE: 5:18 PM Discussed treatment plan with pt at bedside and pt agreed to plan.  Medications Ordered in ED Medications - No data  to display   Initial Impression / Assessment and Plan / ED Course  I have reviewed the triage vital signs and the nursing notes.  Pertinent labs & imaging results that were available during my care of the patient were reviewed by me and considered in my medical decision making (see chart for details).     Hyperglycemic. Not in DKA. HTN w/o signs of acute end organ damage. Scripts for meds provided. Needs further outpt FU.   Final Clinical Impressions(s) / ED Diagnoses   Final diagnoses:  Hyperglycemia  Hypertension, unspecified type    New Prescriptions Discharge Medication List as of 12/28/2016  5:45 PM    START taking these medications   Details  !! glipiZIDE (GLUCOTROL) 10 MG tablet Take  1 tablet (10 mg total) by mouth 2 (two) times daily before a meal., Starting Wed 12/28/2016, Print    hydrochlorothiazide (HYDRODIURIL) 25 MG tablet Take 1 tablet (25 mg total) by mouth daily., Starting Wed 12/28/2016, Print     !! - Potential duplicate medications found. Please discuss with provider.     I personally preformed the services scribed in my presence. The recorded information has been reviewed is accurate. Raeford RazorStephen Ival Pacer, MD.    Raeford RazorKohut, Merinda Victorino, MD 01/06/17 [phone]

## 2016-12-29 ENCOUNTER — Telehealth: Payer: Self-pay

## 2016-12-29 ENCOUNTER — Ambulatory Visit: Payer: Medicaid Other | Admitting: Physician Assistant

## 2016-12-29 ENCOUNTER — Encounter: Payer: Self-pay | Admitting: Physician Assistant

## 2016-12-29 VITALS — BP 128/80 | HR 94 | Temp 97.3°F | Ht 67.0 in | Wt 193.8 lb

## 2016-12-29 DIAGNOSIS — IMO0001 Reserved for inherently not codable concepts without codable children: Secondary | ICD-10-CM

## 2016-12-29 DIAGNOSIS — Z550 Illiteracy and low-level literacy: Secondary | ICD-10-CM

## 2016-12-29 DIAGNOSIS — E669 Obesity, unspecified: Secondary | ICD-10-CM

## 2016-12-29 DIAGNOSIS — E785 Hyperlipidemia, unspecified: Secondary | ICD-10-CM

## 2016-12-29 DIAGNOSIS — I10 Essential (primary) hypertension: Secondary | ICD-10-CM

## 2016-12-29 DIAGNOSIS — E1165 Type 2 diabetes mellitus with hyperglycemia: Principal | ICD-10-CM

## 2016-12-29 LAB — GLUCOSE, POCT (MANUAL RESULT ENTRY): POC GLUCOSE: 415 mg/dL — AB (ref 70–99)

## 2016-12-29 MED ORDER — INSULIN GLARGINE 100 UNIT/ML SOLOSTAR PEN: 20.0000 [IU] | PEN_INJECTOR | Freq: Every day | SUBCUTANEOUS | 99 refills | Status: DC

## 2016-12-29 MED ORDER — METFORMIN HCL 1000 MG PO TABS
1000.0000 mg | ORAL_TABLET | Freq: Two times a day (BID) | ORAL | 1 refills | Status: DC
Start: 2016-12-29 — End: 2017-01-03

## 2016-12-29 NOTE — Progress Notes (Signed)
BP 128/80 (BP Location: Left Arm, Patient Position: Sitting, Cuff Size: Normal)   Pulse 94   Temp (!) 97.3 F (36.3 C)   Ht 5\' 7"  (1.702 m)   Wt 193 lb 12 oz (87.9 kg)   SpO2 99%   BMI 30.35 kg/m    Subjective:    Patient ID: John [name], male    DOB: 04/30/1961, 56 y.o.   MRN: [id number]  HPI: John [name] is a 56 y.o. male presenting on 12/29/2016 for New Patient (Initial Visit)   HPI   Pt previously seen by Dr Lodema Hong but he had no insurance so that didn't really work out for pt.   Pt sees dr Tenny Craw for [facility]   Pt is poor historian.  Pt is non-reader.  He has never been on insulin. He was on metformin in the past but stopped it b/c he couldn't afford it.   Chart reviewed- pt not allergic to metformin.  He had GI intolerance to janumet but did well with metformin prior to taking janumet.   poct a1c yesterday with congregational > 14.  Hx hyperlipidemia  Relevant past medical, surgical, family and social history reviewed and updated as indicated. Interim medical history since our last visit reviewed. Allergies and medications reviewed and updated.   Current Outpatient Prescriptions:  .  ALPRAZolam (XANAX) 1 MG tablet, Take 1 tablet (1 mg total) by mouth daily as needed for anxiety., Disp: 30 tablet, Rfl: 2 .  glipiZIDE (GLUCOTROL) 10 MG tablet, Take 1 tablet (10 mg total) by mouth 2 (two) times daily before a meal., Disp: 180 tablet, Rfl: 0 .  hydrochlorothiazide (HYDRODIURIL) 25 MG tablet, Take 1 tablet (25 mg total) by mouth daily., Disp: 90 tablet, Rfl: 0   Review of Systems  Respiratory: Negative for shortness of breath.   Cardiovascular: Negative for chest pain.  Gastrointestinal: Negative for abdominal pain.    Per HPI unless specifically indicated above     Objective:    BP 128/80 (BP Location: Left Arm, Patient Position: Sitting, Cuff Size: Normal)   Pulse 94   Temp (!) 97.3 F (36.3 C)   Ht 5\' 7"  (1.702 m)   Wt 193 lb 12 oz (87.9 kg)   SpO2 99%    BMI 30.35 kg/m   Wt Readings from Last 3 Encounters:  12/29/16 193 lb 12 oz (87.9 kg)  12/28/16 197 lb (89.4 kg)  12/28/16 195 lb (88.5 kg)    Physical Exam  Constitutional: He is oriented to person, place, and time. He appears well-developed and well-nourished.  HENT:  Head: Normocephalic and atraumatic.  Mouth/Throat: Oropharynx is clear and moist. No oropharyngeal exudate.  Eyes: Pupils are equal, round, and reactive to light. Conjunctivae and EOM are normal.  Neck: Neck supple. No thyromegaly present.  Cardiovascular: Normal rate and regular rhythm.   Pulmonary/Chest: Effort normal and breath sounds normal. He has no wheezes. He has no rales.  Abdominal: Soft. Bowel sounds are normal. He exhibits no mass. There is no hepatosplenomegaly. There is no tenderness.  Musculoskeletal: He exhibits no edema.  Lymphadenopathy:    He has no cervical adenopathy.  Neurological: He is alert and oriented to person, place, and time.  Skin: Skin is warm and dry. No rash noted.  Psychiatric: He has a normal mood and affect. His behavior is normal. Thought content normal.  Vitals reviewed.   Results for orders placed or performed during the hospital encounter of 12/28/16  Basic metabolic panel  Result Value  Ref Range   Sodium 134 (L) 135 - 145 mmol/L   Potassium 3.9 3.5 - 5.1 mmol/L   Chloride 96 (L) 101 - 111 mmol/L   CO2 27 22 - 32 mmol/L   Glucose, Bld 366 (H) 65 - 99 mg/dL   BUN 14 6 - 20 mg/dL   Creatinine, Ser 1.611.22 0.61 - 1.24 mg/dL   Calcium 09.610.0 8.9 - 04.510.3 mg/dL   GFR calc non Af Amer >60 >60 mL/min   GFR calc Af Amer >60 >60 mL/min   Anion gap 11 5 - 15  CBC  Result Value Ref Range   WBC 7.9 4.0 - 10.5 K/uL   RBC 4.81 4.22 - 5.81 MIL/uL   Hemoglobin 14.7 13.0 - 17.0 g/dL   HCT 40.941.7 81.139.0 - 91.452.0 %   MCV 86.7 78.0 - 100.0 fL   MCH 30.6 26.0 - 34.0 pg   MCHC 35.3 30.0 - 36.0 g/dL   RDW 78.212.1 95.611.5 - 21.315.5 %   Platelets 262 150 - 400 K/uL  Urinalysis, Routine w reflex  microscopic  Result Value Ref Range   Color, Urine YELLOW YELLOW   APPearance CLEAR CLEAR   Specific Gravity, Urine 1.031 (H) 1.005 - 1.030   pH 5.0 5.0 - 8.0   Glucose, UA >=500 (A) NEGATIVE mg/dL   Hgb urine dipstick NEGATIVE NEGATIVE   Bilirubin Urine NEGATIVE NEGATIVE   Ketones, ur 5 (A) NEGATIVE mg/dL   Protein, ur NEGATIVE NEGATIVE mg/dL   Nitrite NEGATIVE NEGATIVE   Leukocytes, UA NEGATIVE NEGATIVE   RBC / HPF 0-5 0 - 5 RBC/hpf   WBC, UA 0-5 0 - 5 WBC/hpf   Bacteria, UA NONE SEEN NONE SEEN   Squamous Epithelial / LPF NONE SEEN NONE SEEN  CBG monitoring, ED  Result Value Ref Range   Glucose-Capillary 354 (H) 65 - 99 mg/dL      Assessment & Plan:    Encounter Diagnoses  Name Primary?  [name]. Uncontrolled diabetes mellitus type 2 without complications, unspecified whether long term insulin use (HCC) Yes  . Essential hypertension   . Hyperlipidemia, unspecified hyperlipidemia type   . 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 Obesity, unspecified classification, unspecified obesity type, unspecified whether serious comorbidity present     -pt to get fasting labs drawn tomorrow morning -pt Stop glipizide (to avoid hypoglycemia since adding insulin).  rx metformin and lantus -pt instructed at length on proper method for administering insulin and he is given reading material (he says someone at home can help him with that reading) -will get pt signed up for [facility]- (lisinopril, metformin) -pt to Monitor bs.  Pt to call office for fbs < 70 or > 300 -pt to follow up 1 week with bs log (will do foot exam, give iFOBT). . RTO sooner prn

## 2016-12-29 NOTE — Patient Instructions (Signed)
How and Where to Give Subcutaneous Insulin Injections, Adult   People with type 1 diabetes must take insulin since their bodies do not make it. People with type 2 diabetes may require insulin. There are many different types of insulin as well as other injectable diabetes medicines that are meant to be injected into the fat layer under your skin. The type of insulin or injectable diabetes medicine you take may determine how many injections you give yourself and when to take the injections.   Choosing a site for injection Insulin absorption varies from site to site. As with any injectable medication it is best for the insulin to be injected within the same body region. However, do not inject the insulin in the same spot each time. Rotating the spots you give your injections will prevent inflammation or tissue breakdown. There are four main regions that can be used for injections. The regions include the:  Abdomen (preferred region, especially for non-insulin injectable diabetes medicine).  Front and upper outer sides of thighs.  Back of upper arm.  Buttocks.    Using an insulin pen  1. Wash your hands with soap and water. 2. If you are using the "cloudy" insulin, roll the pen between your palms several times or rotate the pen top to bottom several times. 3. Remove the insulin pen cap. 4. Clean the rubber stopper of the cartridge with an alcohol wipe. 5. Remove the protective paper tab from the disposable needle. 6. Screw the needle onto the pen. 7. Remove the outer plastic needle cover. 8. Remove the inner plastic needle cover. 9. Prime the insulin pen by turning the button (dial) to 2 units. Hold the pen with the needle pointing up, and push the dial on the opposite end until a drop of insulin appears at the needle tip. If no insulin appears, repeat this step. 10. Dial the number of units of insulin you will inject. 11. Use an alcohol wipe to clean the area of the body to be  injected. 12. Pinch up 1 inch of skin and hold it. 13. Put the needle straight into the skin (90-degree angle). 14. Push the dial down to push the insulin into the fat tissue. 15. Count to 10 slowly. Then, remove the needle from the fat tissue. 16. Carefully replace the larger outer plastic needle cover over the needle and unscrew the capped needle.   Throwing away supplies  Discard used needles in a puncture proof sharps disposal container. Follow disposal regulations for the area where you live.  Vials and empty disposable pens may be thrown away in the regular trash   This information is not intended to replace advice given to you by your health care provider. Make sure you discuss any questions you have with your health care provider. Document Released: 08/27/2003 Document Revised: 11/12/2015 Document Reviewed: 11/13/2012 Elsevier Interactive Patient Education  [employer]2018 Elsevier Inc.

## 2016-12-29 NOTE — Telephone Encounter (Signed)
[facility] . Client states his visit went well , except they changed his medications from pills to insulin. He is unsure of how that will go. RN verified that client has a meter and strips and is aware of how to check his blood sugars and when. He states he has about 25 strips now and is to check his sugars once every morning first thing and that he knows how to check his sugars. Client reminded he is to come in to see Social worker on 01/03/17 , client states he plans on being here. Will follow up with client at time again.

## 2016-12-30 ENCOUNTER — Other Ambulatory Visit (HOSPITAL_COMMUNITY)
Admission: RE | Admit: 2016-12-30 | Discharge: 2016-12-30 | Disposition: A | Discharge status: Home / Self Care | Payer: Medicaid Other | Source: Ambulatory Visit | Attending: Physician Assistant | Admitting: Physician Assistant

## 2016-12-30 LAB — HEPATIC FUNCTION PANEL
ALBUMIN: 4.1 g/dL (ref 3.5–5.0)
ALT: 34 U/L (ref 17–63)
AST: 27 U/L (ref 15–41)
Alkaline Phosphatase: 95 U/L (ref 38–126)
BILIRUBIN TOTAL: 0.8 mg/dL (ref 0.3–1.2)
Total Protein: 7.9 g/dL (ref 6.5–8.1)

## 2016-12-30 LAB — LIPID PANEL
CHOLESTEROL: 378 mg/dL — AB (ref 0–200)
HDL: 39 mg/dL — ABNORMAL LOW (ref >40)
LDL Cholesterol: 275 mg/dL — ABNORMAL HIGH (ref 0–99)
TRIGLYCERIDES: 319 mg/dL — AB (ref <150)
Total CHOL/HDL Ratio: 9.7 RATIO
VLDL: 64 mg/dL — ABNORMAL HIGH (ref 0–40)

## 2016-12-31 LAB — MICROALBUMIN, URINE: Microalb, Ur: 134.8 ug/mL — ABNORMAL HIGH

## 2017-01-03 ENCOUNTER — Other Ambulatory Visit: Payer: Self-pay | Admitting: Physician Assistant

## 2017-01-03 DIAGNOSIS — Z139 Encounter for screening, unspecified: Secondary | ICD-10-CM

## 2017-01-03 LAB — HEMOGLOBIN A1C
Hgb A1c MFr Bld: 14.1 % — ABNORMAL HIGH (ref 4.8–5.6)
Mean Plasma Glucose: 358 mg/dL

## 2017-01-03 LAB — GLUCOSE, POCT (MANUAL RESULT ENTRY): POC Glucose: 293 mg/dl — AB (ref 70–99)

## 2017-01-03 MED ORDER — METFORMIN HCL 1000 MG PO TABS: 1000.0000 mg | ORAL_TABLET | Freq: Two times a day (BID) | ORAL | 1 refills | Status: DC

## 2017-01-03 MED ORDER — LISINOPRIL 10 MG PO TABS: 10.0000 mg | ORAL_TABLET | Freq: Every day | ORAL | 1 refills | Status: DC

## 2017-01-03 NOTE — Congregational Nurse Program (Signed)
Congregational Nurse Program Note  Date of Encounter: 01/03/2017  Past Medical History: Past Medical History:  Diagnosis Date  . Anxiety   . Depression 2007  . Diabetes mellitus without complication (HCC)   . Hyperlipidemia   . Hypertension   . Hypogonadism male     Encounter Details:     CNP Questionnaire - 01/03/17 1515      Patient Demographics   Is this a new or existing patient? Existing   Patient is considered a/an Not Applicable   Race African-American/Black     Patient Assistance   Location of Patient Assistance [name]   Patient's financial/insurance status Self-Pay (Uninsured);Low Income   Uninsured Patient ([place] Card/Care Connects) Yes   Interventions Appt. has been completed;Assisted patient in making appt.   Patient referred to apply for the following financial assistance Not Applicable   Food insecurities addressed Not Applicable   Transportation assistance No   Assistance securing medications No   Educational health offerings Not Applicable     Encounter Details   Primary purpose of visit Education/Health Concerns;Navigating the Healthcare System   Was an Emergency Department visit averted? No   Does patient have a medical provider? Yes   Patient referred to Not Applicable   Was a mental health screening completed? (GAINS tool) Yes   Was a mental health referral made? Yes  [facility]   Does patient have dental issues? No   Does patient have vision issues? No   Was a vision referral made? No   Does your patient have an abnormal blood pressure today? Yes   Since previous encounter, have you referred patient for abnormal blood pressure that resulted in a new diagnosis or medication change? Yes   Does your patient have an abnormal blood glucose today? Yes   Since previous encounter, have you referred patient for abnormal blood glucose that resulted in a new diagnosis or medication change? Yes   Was there a life-saving intervention made? No      [facility] on 12/29/2010 .Alert and oriented to person, time, and place. Patient was seen previously because he was unable to continue with his previous medical provider, Dr. Lodema HongSimpson, and was without medications for months due to no income and no insurance. However, patient was [facility] with [facility] on 12/29/2016.   [facility]  to see social worker [employer]mber Stafford. [facility] and to do a walk in since he has not been able to continue with Dr. Tenny Crawoss for his mental health.   Before leaving patient agreed on nursing staff to recheck his vitals.  BP: 169/99 P: 91 T: 98.5 oral Glucose:293 non fasting  Patient voiced to nurse that his medication metformin 1,000mg  was making him nauseous and stopped taking medication due that reason since Sunday (2days ago). RN Norval Gable(Patricia Gilley) [facility] and was able to reschedule his appointment and new appointment was made for 01/04/17 9:15 am. Pt agreed to new appointment.    Patient was reminded to always call [facility] if he is experiencing any problems with any of his medications, and to take his glucose log to his appointment.    Tomas Schamp R. Ladale Sherburn LPN [phone]

## 2017-01-04 ENCOUNTER — Encounter: Payer: Self-pay | Admitting: Physician Assistant

## 2017-01-04 ENCOUNTER — Ambulatory Visit: Payer: Medicaid Other | Admitting: Physician Assistant

## 2017-01-04 VITALS — BP 130/88 | HR 78 | Ht 67.0 in | Wt 192.5 lb

## 2017-01-04 DIAGNOSIS — Z550 Illiteracy and low-level literacy: Secondary | ICD-10-CM

## 2017-01-04 DIAGNOSIS — E1165 Type 2 diabetes mellitus with hyperglycemia: Principal | ICD-10-CM

## 2017-01-04 DIAGNOSIS — E669 Obesity, unspecified: Secondary | ICD-10-CM

## 2017-01-04 DIAGNOSIS — IMO0001 Reserved for inherently not codable concepts without codable children: Secondary | ICD-10-CM

## 2017-01-04 DIAGNOSIS — E785 Hyperlipidemia, unspecified: Secondary | ICD-10-CM

## 2017-01-04 DIAGNOSIS — I10 Essential (primary) hypertension: Secondary | ICD-10-CM

## 2017-01-04 MED ORDER — BLOOD GLUCOSE MONITOR KIT: PACK | 0 refills | Status: DC

## 2017-01-04 MED ORDER — ATORVASTATIN CALCIUM 20 MG PO TABS: 20.0000 mg | ORAL_TABLET | Freq: Every day | ORAL | 1 refills | Status: DC

## 2017-01-04 NOTE — Congregational Nurse Program (Signed)
Congregational Nurse Program Note  Date of Encounter: 01/03/2017  Past Medical History: Past Medical History:  Diagnosis Date  . Anxiety   . Depression 2007  . Diabetes mellitus without complication (HCC)   . Hyperlipidemia   . Hypertension   . Hypogonadism male     Encounter Details:     CNP Questionnaire - 01/04/17 1543      Patient Demographics   Is this a new or existing patient? Existing   Patient is considered a/an Not Applicable   Race African-American/Black     Patient Assistance   Location of Patient Assistance [name]   Patient's financial/insurance status Self-Pay (Uninsured);Low Income   Uninsured Patient ([place] Card/Care Connects) Yes   Interventions Assisted patient in making appt.   Patient referred to apply for the following financial assistance Not Applicable   Food insecurities addressed Not Applicable   Transportation assistance No   Assistance securing medications No   Educational health offerings Not Applicable     Encounter Details   Primary purpose of visit Education/Health Concerns;Navigating the Healthcare System   Was an Emergency Department visit averted? No   Does patient have a medical provider? Yes   Patient referred to Not Applicable   Was a mental health screening completed? (GAINS tool) No   Was a mental health referral made? No   Does patient have dental issues? No   Does patient have vision issues? No   Was a vision referral made? No   Does your patient have an abnormal blood pressure today? No   Since previous encounter, have you referred patient for abnormal blood pressure that resulted in a new diagnosis or medication change? No   Does your patient have an abnormal blood glucose today? No   Since previous encounter, have you referred patient for abnormal blood glucose that resulted in a new diagnosis or medication change? No   Was there a life-saving intervention made? No     Client was referred to Child psychotherapistsocial worker by  Congregational Nurse, Norval GablePatricia Gilley, RN for a mental health assessment due to depression and anxiety. Client denied both current suicidal and homicidal ideation. Client denied both auditory and visual hallucinations. Client reported feeling suicidal "about two years ago" when his depression and anxiety "got really bad". Client reported losing his son in 2005 in a car accident. Client denied ever having a plan for suicide.   Client reported drinking "a little bit, every once in a while" and denied any other drug abuse or history of drug abuse. Last time the client drank was two beers about a month ago.   Social worker administered the GAIN-SS with the client. The client's total score for the past 90 days was a 5. Social worker [facility]. Client currently sees a psychiatrist, Dr. Tenny Crawoss, and client would like to continue to work with Dr. Tenny Crawoss and see [facility]. Social worker informed receptionist at [facility] that client currently has a Therapist, sportspsychiatrist.   Client was encouraged to contact social worker if he has additional needs and Child psychotherapistsocial worker will follow up with client to see if he connects with [facility].   Irwin BrakemanAmber Gardner Servantes, MSW, [phone]

## 2017-01-04 NOTE — Progress Notes (Signed)
BP 130/88 (BP Location: Left Arm, Patient Position: Sitting, Cuff Size: Normal)   Pulse 78   Ht 5\' 7"  (1.702 m)   Wt 192 lb 8 oz (87.3 kg)   SpO2 96%   BMI 30.15 kg/m    Subjective:    Patient ID: John [name] Dapper, male    DOB: [birth year], 56 y.o.   MRN: [id number]  HPI: John [name] John [name] is a 56 y.o. male presenting on 01/04/2017 for Diabetes   HPI  Pt took metformin in the past but he now says it makes him nauseous.    He says that this morning his fbs was 249.  He has his bs log with him.  Range- [id number].  He is using the insulin without problem.    We Had ordered lisinopril from [facility] but will cancel that order since pt has medicaid (which we did not know when he was seen here for new pt appointment)  Relevant past medical, surgical, family and social history reviewed and updated as indicated. Interim medical history since our last visit reviewed. Allergies and medications reviewed and updated.   Current Outpatient Prescriptions:  .  ALPRAZolam (XANAX) 1 MG tablet, Take 1 tablet (1 mg total) by mouth daily as needed for anxiety., Disp: 30 tablet, Rfl: 2 .  hydrochlorothiazide (HYDRODIURIL) 25 MG tablet, Take 1 tablet (25 mg total) by mouth daily., Disp: 90 tablet, Rfl: 0 .  Insulin Glargine (LANTUS SOLOSTAR) 100 UNIT/ML Solostar Pen, Inject 20 Units into the skin at bedtime., Disp: 5 pen, Rfl: PRN .  lisinopril (PRINIVIL,ZESTRIL) 10 MG tablet, Take 1 tablet (10 mg total) by mouth daily. (Patient not taking: Reported on 01/04/2017), Disp: 90 tablet, Rfl: 1 .  metFORMIN (GLUCOPHAGE) 1000 MG tablet, Take 1 tablet (1,000 mg total) by mouth 2 (two) times daily with a meal. (Patient not taking: Reported on 01/04/2017), Disp: 180 tablet, Rfl: 1   Review of Systems  Respiratory: Negative for shortness of breath.   Cardiovascular: Negative for chest pain.  Gastrointestinal: Negative for abdominal pain.    Per HPI unless specifically indicated above     Objective:    BP  130/88 (BP Location: Left Arm, Patient Position: Sitting, Cuff Size: Normal)   Pulse 78   Ht 5\' 7"  (1.702 m)   Wt 192 lb 8 oz (87.3 kg)   SpO2 96%   BMI 30.15 kg/m   Wt Readings from Last 3 Encounters:  01/04/17 192 lb 8 oz (87.3 kg)  12/29/16 193 lb 12 oz (87.9 kg)  12/28/16 197 lb (89.4 kg)    Physical Exam  Constitutional: He is oriented to person, place, and time. He appears well-developed and well-nourished.  HENT:  Head: Normocephalic and atraumatic.  Neck: Neck supple.  Cardiovascular: Normal rate and regular rhythm.   Pulmonary/Chest: Effort normal and breath sounds normal. He has no wheezes.  Abdominal: Soft. Bowel sounds are normal. There is no hepatosplenomegaly. There is no tenderness.  Musculoskeletal: He exhibits no edema.  Lymphadenopathy:    He has no cervical adenopathy.  Neurological: He is alert and oriented to person, place, and time.  Skin: Skin is warm and dry.  Psychiatric: He has a normal mood and affect. His behavior is normal.  Vitals reviewed.       Assessment & Plan:   Encounter Diagnoses  Name Primary?  John [name]. Uncontrolled diabetes mellitus type 2 without complications, unspecified whether long term insulin use (HCC) Yes  . Essential hypertension   . Hyperlipidemia, unspecified hyperlipidemia type   .  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 Obesity, unspecified classification, unspecified obesity type, unspecified whether serious comorbidity present      -Reviewed labs with pt- a1c- 14, lipids elevated -Stop metformin.  Cancel lisinopril order. -Pt to start lipitor for his cholesterol.  Discussed that he should follow low-fat diet to help his lipids as well.   -will Increase insulin to 30 units nightly.  Pt is to continue to check his morning fbs and to check it at other times as he feels the need.   Pt is reminded to call office for fbs < 70 or > 300.   -scheduled pt to return to dr Lodema Hong since he was previously her pt and he now has medicaid.  Pt is to  call our office or RTO if he has any problems before he gets in with dr Lodema Hong.   Pt is instructed to take his bs log with him to his appointment with dr Lodema Hong. -pt states understanding of his verbal instructions

## 2017-01-04 NOTE — Congregational Nurse Program (Signed)
Congregational Nurse Program Note  Date of Encounter: 01/04/2017  Past Medical History: Past Medical History:  Diagnosis Date  . Anxiety   . Depression 2007  . Diabetes mellitus without complication (HCC)   . Hyperlipidemia   . Hypertension   . Hypogonadism male     Encounter Details:     CNP Questionnaire - 01/04/17 1543      Patient Demographics   Is this a new or existing patient? Existing   Patient is considered a/an Not Applicable   Race African-American/Black     Patient Assistance   Location of Patient Assistance [name]   Patient's financial/insurance status Self-Pay (Uninsured);Low Income   Uninsured Patient ([place] Card/Care Connects) Yes   Interventions Assisted patient in making appt.   Patient referred to apply for the following financial assistance Not Applicable   Food insecurities addressed Not Applicable   Transportation assistance No   Assistance securing medications No   Educational health offerings Not Applicable     Encounter Details   Primary purpose of visit Education/Health Concerns;Navigating the Healthcare System   Was an Emergency Department visit averted? No   Does patient have a medical provider? Yes   Patient referred to Not Applicable   Was a mental health screening completed? (GAINS tool) No   Was a mental health referral made? No   Does patient have dental issues? No   Does patient have vision issues? No   Was a vision referral made? No   Does your patient have an abnormal blood pressure today? No   Since previous encounter, have you referred patient for abnormal blood pressure that resulted in a new diagnosis or medication change? No   Does your patient have an abnormal blood glucose today? No   Since previous encounter, have you referred patient for abnormal blood glucose that resulted in a new diagnosis or medication change? No   Was there a life-saving intervention made? No     Social worker assisted client in calling  [employer] to have a replacement Medicaid card sent to him. Case manager at [employer] informed client he should receive his replacment card within 7-10 business days. Social worker also attempted to contact the client's primary care doctor, [facility] to help him set up an appointment. Client left a voice message and will wait for return call.   Client was informed that he can still attend [facility] for counseling with his Medicaid. Client was encouraged to contact social worker with any needs or concerns in the future.  John BrakemanAmber Brittain Hosie, MSW, [phone]

## 2017-01-05 ENCOUNTER — Ambulatory Visit: Payer: Self-pay | Admitting: Physician Assistant

## 2017-01-10 ENCOUNTER — Encounter: Payer: Self-pay | Admitting: Family Medicine

## 2017-01-10 ENCOUNTER — Ambulatory Visit (INDEPENDENT_AMBULATORY_CARE_PROVIDER_SITE_OTHER): Payer: Medicaid Other | Admitting: Family Medicine

## 2017-01-10 VITALS — BP 140/90 | HR 98 | Temp 97.0°F | Resp 16 | Ht 68.0 in | Wt 126.2 lb

## 2017-01-10 DIAGNOSIS — I10 Essential (primary) hypertension: Secondary | ICD-10-CM | POA: Diagnosis not present

## 2017-01-10 DIAGNOSIS — E1169 Type 2 diabetes mellitus with other specified complication: Secondary | ICD-10-CM

## 2017-01-10 DIAGNOSIS — E785 Hyperlipidemia, unspecified: Secondary | ICD-10-CM | POA: Diagnosis not present

## 2017-01-10 DIAGNOSIS — F418 Other specified anxiety disorders: Secondary | ICD-10-CM | POA: Diagnosis not present

## 2017-01-10 DIAGNOSIS — E669 Obesity, unspecified: Secondary | ICD-10-CM

## 2017-01-10 MED ORDER — INSULIN GLARGINE 100 UNIT/ML SOLOSTAR PEN: PEN_INJECTOR | SUBCUTANEOUS | 99 refills | Status: DC

## 2017-01-10 MED ORDER — METFORMIN HCL 1000 MG PO TABS: ORAL_TABLET | ORAL | 1 refills | Status: DC

## 2017-01-10 MED ORDER — BENAZEPRIL HCL 10 MG PO TABS: 10.0000 mg | ORAL_TABLET | Freq: Every day | ORAL | 1 refills | Status: DC

## 2017-01-10 MED ORDER — BENAZEPRIL-HYDROCHLOROTHIAZIDE 10-12.5 MG PO TABS: 1.0000 | ORAL_TABLET | Freq: Every day | ORAL | 3 refills | Status: DC

## 2017-01-10 MED ORDER — ROSUVASTATIN CALCIUM 20 MG PO TABS: 20.0000 mg | ORAL_TABLET | Freq: Every day | ORAL | 4 refills | Status: DC

## 2017-01-10 NOTE — Patient Instructions (Addendum)
F/u Oct 20 or after, call if you needme sooner  HBA1C, , fasting lipid, cmp and EGFR, Oct 13 or after  Start half metformin twice daily  New additional med benazerpril, 10 mg one daily for blood pressure continue hCTZ  New tablet for cholesterol and cut backon fried and fatty foods  You are being referred back to psychiatry for your nerves

## 2017-01-21 ENCOUNTER — Encounter: Payer: Self-pay | Admitting: Family Medicine

## 2017-01-21 NOTE — Assessment & Plan Note (Signed)
Needs to be re evaluated and continue treatment through psychiatry , referral entered Not suicidal or homicidal

## 2017-01-21 NOTE — Progress Notes (Signed)
John RoundGary K [name]     MRN: [id number]      DOB: Oct 20, 1960   HPI [name] is here for follow up and re-evaluation of chronic medical conditions, to re establish care , now he has medicare, and medication management and review of any available recent lab and radiology data.  [facility] and when he went to the ED for uncontrolled diabetes earlier in July, he then found out that he in fact had medicaid, also needs to follow up with psychiatry for management of depression and anxiety. He has recently been started on insulin and seems to be doing fairly well managing this, but absolutely needs the expertise of endocrinology for optimal diabetes control. The PT denies any adverse reactions to current medications since the last visit.  Requests medication for his nerves, needs to return to treatment by psychiatry, not suicidal or homicidal  ROS Denies recent fever or chills. Denies sinus pressure, nasal congestion, ear pain or sore throat. Denies chest congestion, productive cough or wheezing. Denies chest pains, palpitations and leg swelling Denies abdominal pain, nausea, vomiting,diarrhea or constipation.   Denies dysuria, frequency, hesitancy or incontinence. Denies joint pain, swelling and limitation in mobility. Denies headaches, seizures, numbness, or tingling.  Denies skin break down or rash.   PE  BP 140/90 (BP Location: Left Arm, Patient Position: Sitting, Cuff Size: Normal)   Pulse 98   Temp (!) 97 F (36.1 C) (Other (Comment))   Resp 16   Ht 5\' 8"  (1.727 m)   Wt 126 lb 4 oz (57.3 kg)   SpO2 98%   BMI 19.20 kg/m   Patient alert and oriented and in no cardiopulmonary distress.  HEENT: No facial asymmetry, EOMI,   oropharynx pink and moist.  Neck supple no JVD, no mass.  Chest: Clear to auscultation bilaterally.  CVS: S1, S2 no murmurs, no S3.Regular rate.  ABD: Soft non tender.   Ext: No edema  MS: Adequate ROM spine, shoulders, hips and  knees.  Skin: Intact, no ulcerations or rash noted.  Psych: Good eye contact, normal affect. Memory intact not anxious or depressed appearing.  CNS: CN 2-12 intact, power,  normal throughout.no focal deficits noted.   Assessment & Plan  Diabetes mellitus type 2 in obese (HCC) Uncontrolled, now on isulin, needs to be followed by endp, [facility] , no appt in system, is being referred again [name] is reminded of the importance of commitment to daily physical activity for 30 minutes or more, as able and the need to limit carbohydrate intake to 30 to 60 grams per meal to help with blood sugar control.   The need to take medication as prescribed, test blood sugar as directed, and to call between visits if there is a concern that blood sugar is uncontrolled is also discussed.   [name] is reminded of the importance of daily foot exam, annual eye examination, and good blood sugar, blood pressure and cholesterol control.  Diabetic Labs Latest Ref Rng & Units 12/30/2016 12/28/2016 09/18/2016 01/26/2016 01/05/2016  HbA1c 4.8 - 5.6 % 14.1(H) >14% - - -  Microalbumin Not Estab. ug/mL 134.8(H) - - - -  Micro/Creat Ratio <30 mcg/mg creat - - - - -  Chol 0 - 200 mg/dL 956(O378(H) - - 130139 -  HDL >86>40 mg/dL 57(Q39(L) - - 46(N39(L) -  Calc LDL 0 - 99 mg/dL 629(B275(H) - - 84 -  Triglycerides <150 mg/dL 284(X319(H) - - 79 -  Creatinine 0.61 - 1.24 mg/dL - 1.611.22 0.96(E1.28(H) - 4.54(U1.27(H)   BP/Weight 01/10/2017 01/04/2017 01/03/2017 12/29/2016 12/28/2016 12/28/2016 10/13/2016  Systolic BP 140 130 169 128 172 149 142  Diastolic BP 90 88 99 80 92 93 90  Wt. (Lbs) 126.25 192.5 - 193.75 195 197 194  BMI 19.2 30.15 - 30.35 30.54 29.95 29.5  Some encounter information is confidential and restricted. Go to Review Flowsheets activity to see all data.   Foot/eye exam completion dates 01/26/2016 01/19/2015  Foot Form Completion Done Done   Also for diabetic education     Depression with anxiety Needs to be re evaluated and  continue treatment through psychiatry , referral entered Not suicidal or homicidal  Hyperlipidemia Hyperlipidemia:Low fat diet discussed and encouraged.   Lipid Panel  Lab Results  Component Value Date   CHOL 378 (H) 12/30/2016   HDL 39 (L) 12/30/2016   LDLCALC 275 (H) 12/30/2016   TRIG 319 (H) 12/30/2016   CHOLHDL 9.7 12/30/2016   Markedly elvated and uncontrolled, needs to commit to low fat diet and statin therapy    Essential hypertension DASH diet and commitment to daily physical activity for a minimum of 30 minutes discussed and encouraged, as a part of hypertension management. The importance of attaining a healthy weight is also discussed. Uncontrolled, medication prescribed and importance of compliance is stressed, benazepril is added  BP/Weight 01/10/2017 01/04/2017 01/03/2017 12/29/2016 12/28/2016 12/28/2016 10/13/2016  Systolic BP 140 130 169 128 172 149 142  Diastolic BP 90 88 99 80 92 93 90  Wt. (Lbs) 126.25 192.5 - 193.75 195 197 194  BMI 19.2 30.15 - 30.35 30.54 29.95 29.5  Some encounter information is confidential and restricted. Go to Review Flowsheets activity to see all data.

## 2017-01-21 NOTE — Assessment & Plan Note (Signed)
Hyperlipidemia:Low fat diet discussed and encouraged.   Lipid Panel  Lab Results  Component Value Date   CHOL 378 (H) 12/30/2016   HDL 39 (L) 12/30/2016   LDLCALC 275 (H) 12/30/2016   TRIG 319 (H) 12/30/2016   CHOLHDL 9.7 12/30/2016   Markedly elvated and uncontrolled, needs to commit to low fat diet and statin therapy

## 2017-01-21 NOTE — Assessment & Plan Note (Signed)
Uncontrolled, now on isulin, needs to be followed by endp, [facility] , no appt in system, is being referred again [name] is reminded of the importance of commitment to daily physical activity for 30 minutes or more, as able and the need to limit carbohydrate intake to 30 to 60 grams per meal to help with blood sugar control.   The need to take medication as prescribed, test blood sugar as directed, and to call between visits if there is a concern that blood sugar is uncontrolled is also discussed.   [name] is reminded of the importance of daily foot exam, annual eye examination, and good blood sugar, blood pressure and cholesterol control.  Diabetic Labs Latest Ref Rng & Units 12/30/2016 12/28/2016 09/18/2016 01/26/2016 01/05/2016  HbA1c 4.8 - 5.6 % 14.1(H) >14% - - -  Microalbumin Not Estab. ug/mL 134.8(H) - - - -  Micro/Creat Ratio <30 mcg/mg creat - - - - -  Chol 0 - 200 mg/dL 540(J378(H) - - 811139 -  HDL >91>40 mg/dL 47(W39(L) - - 29(F39(L) -  Calc LDL 0 - 99 mg/dL 621(H275(H) - - 84 -  Triglycerides <150 mg/dL 086(V319(H) - - 79 -  Creatinine 0.61 - 1.24 mg/dL - 7.841.22 6.96(E1.28(H) - 9.52(W1.27(H)   BP/Weight 01/10/2017 01/04/2017 01/03/2017 12/29/2016 12/28/2016 12/28/2016 10/13/2016  Systolic BP 140 130 169 128 172 149 142  Diastolic BP 90 88 99 80 92 93 90  Wt. (Lbs) 126.25 192.5 - 193.75 195 197 194  BMI 19.2 30.15 - 30.35 30.54 29.95 29.5  Some encounter information is confidential and restricted. Go to Review Flowsheets activity to see all data.   Foot/eye exam completion dates 01/26/2016 01/19/2015  Foot Form Completion Done Done   Also for diabetic education

## 2017-01-21 NOTE — Assessment & Plan Note (Addendum)
DASH diet and commitment to daily physical activity for a minimum of 30 minutes discussed and encouraged, as a part of hypertension management. The importance of attaining a healthy weight is also discussed. Uncontrolled, medication prescribed and importance of compliance is stressed, benazepril is added  BP/Weight 01/10/2017 01/04/2017 01/03/2017 12/29/2016 12/28/2016 12/28/2016 10/13/2016  Systolic BP 140 130 169 128 172 149 142  Diastolic BP 90 88 99 80 92 93 90  Wt. (Lbs) 126.25 192.5 - 193.75 195 197 194  BMI 19.2 30.15 - 30.35 30.54 29.95 29.5  Some encounter information is confidential and restricted. Go to Review Flowsheets activity to see all data.

## 2017-01-23 ENCOUNTER — Telehealth: Payer: Self-pay | Admitting: Family Medicine

## 2017-01-23 NOTE — Telephone Encounter (Signed)
Error

## 2017-03-01 ENCOUNTER — Encounter: Payer: Self-pay | Admitting: "Endocrinology

## 2017-03-01 ENCOUNTER — Ambulatory Visit (INDEPENDENT_AMBULATORY_CARE_PROVIDER_SITE_OTHER): Payer: Medicaid Other | Admitting: "Endocrinology

## 2017-03-01 VITALS — BP 141/89 | HR 91 | Ht 68.0 in | Wt 198.0 lb

## 2017-03-01 DIAGNOSIS — I10 Essential (primary) hypertension: Secondary | ICD-10-CM | POA: Diagnosis not present

## 2017-03-01 DIAGNOSIS — Z6830 Body mass index (BMI) 30.0-30.9, adult: Secondary | ICD-10-CM

## 2017-03-01 DIAGNOSIS — E66811 Obesity, class 1: Secondary | ICD-10-CM

## 2017-03-01 DIAGNOSIS — E1169 Type 2 diabetes mellitus with other specified complication: Secondary | ICD-10-CM

## 2017-03-01 DIAGNOSIS — E669 Obesity, unspecified: Secondary | ICD-10-CM | POA: Insufficient documentation

## 2017-03-01 DIAGNOSIS — E782 Mixed hyperlipidemia: Secondary | ICD-10-CM

## 2017-03-01 DIAGNOSIS — E6609 Other obesity due to excess calories: Secondary | ICD-10-CM | POA: Diagnosis not present

## 2017-03-01 HISTORY — DX: Obesity, unspecified: E66.9

## 2017-03-01 HISTORY — DX: Obesity, class 1: E66.811

## 2017-03-01 MED ORDER — ATORVASTATIN CALCIUM 20 MG PO TABS
20.0000 mg | ORAL_TABLET | Freq: Every day | ORAL | 6 refills | Status: DC
Start: 2017-03-01 — End: 2017-04-18

## 2017-03-01 MED ORDER — INSULIN GLARGINE 100 UNIT/ML SOLOSTAR PEN: 40.0000 [IU] | PEN_INJECTOR | Freq: Every day | SUBCUTANEOUS | 2 refills | Status: DC

## 2017-03-01 MED ORDER — METFORMIN HCL 500 MG PO TABS: ORAL_TABLET | ORAL | 3 refills | Status: DC

## 2017-03-01 NOTE — Progress Notes (Signed)
Subjective:    Patient ID: John [name], male    DOB: 10/14/1960. Patient is being seen in f/u  for management of diabetes requested by  Fayrene Helper, MD  Past Medical History:  Diagnosis Date  . Anxiety   . Depression 2007  . Diabetes mellitus without complication ([place])   . Hyperlipidemia   . Hypertension   . Hypogonadism male    Past Surgical History:  Procedure Laterality Date  . COLONOSCOPY  01/20/2011   Procedure: COLONOSCOPY;  Surgeon: Dorothyann Peng, MD;  Location: [facility];  Service: Endoscopy;  Laterality: N/A;  . HERNIA REPAIR     ventral hernia repair    Social History   Social History  . Marital status: Married    Spouse name: N/A  . Number of children: 1  . Years of education: N/A   Occupational History  . Production assistant, radio    Social History Main Topics  . Smoking status: Never Smoker  . Smokeless tobacco: Never Used  . Alcohol use 0.6 oz/week    1 Cans of beer per week     Comment: occasional beer socially.   . Drug use: No  . Sexual activity: Yes   Other Topics Concern  . Not on file   Social History Narrative  . No narrative on file   Outpatient Encounter Prescriptions as of 03/01/2017  Medication Sig  . ALPRAZolam (XANAX) 1 MG tablet Take 1 tablet (1 mg total) by mouth daily as needed for anxiety.  [name] atorvastatin (LIPITOR) 20 MG tablet Take 1 tablet (20 mg total) by mouth daily.  . benazepril (LOTENSIN) 10 MG tablet Take 1 tablet (10 mg total) by mouth daily.  . blood glucose meter kit and supplies KIT Dispense based on patient and insurance preference. Use up to four times daily as directed. (FOR ICD-9 250.00, 250.01).  . hydrochlorothiazide (HYDRODIURIL) 25 MG tablet Take 1 tablet (25 mg total) by mouth daily.  . Insulin Glargine (LANTUS SOLOSTAR) 100 UNIT/ML Solostar Pen Inject 40 Units into the skin daily at 10 pm.  . metFORMIN (GLUCOPHAGE) 500 MG tablet Half tablet twice daily  . [DISCONTINUED] atorvastatin (LIPITOR) 20 MG  tablet Take 1 tablet (20 mg total) by mouth daily. (Patient not taking: Reported on 01/10/2017)  . [DISCONTINUED] Insulin Glargine (LANTUS SOLOSTAR) 100 UNIT/ML Solostar Pen Thirty units once daily at the same time (Patient taking differently: Inject 30 Units into the skin at bedtime. Thirty units once daily at the same time)  . [DISCONTINUED] metFORMIN (GLUCOPHAGE) 1000 MG tablet Half tablet twice daily  . [DISCONTINUED] rosuvastatin (CRESTOR) 20 MG tablet Take 1 tablet (20 mg total) by mouth daily. (Patient not taking: Reported on 03/01/2017)   No facility-administered encounter medications on file as of 03/01/2017.    ALLERGIES: Allergies  Allergen Reactions  . Januvia [Sitagliptin] Nausea And Vomiting  . Metformin And Related Nausea Only  . Poison Oak Extract [Poison Oak Extract] Dermatitis   VACCINATION STATUS: Immunization History  Administered Date(s) Administered  . Influenza Whole 03/18/2010, 04/13/2011  . Influenza,inj,Quad PF,6+ Mos 05/20/2013, 05/08/2014, 06/11/2015  . Pneumococcal Polysaccharide-23 11/26/2013  . Td 09/15/2009    Diabetes  He presents for his follow-up diabetic visit. He has type 2 diabetes mellitus. Onset time: He was diagnosed at age 24. His disease course has been worsening (Patient was supposed to return for a follow-up visit after his last August 2017, unfortunately he never showed up.). There are no hypoglycemic associated symptoms. Pertinent negatives  for hypoglycemia include no headaches, seizures or tremors. Associated symptoms include blurred vision, polydipsia and polyuria. Pertinent negatives for diabetes include no chest pain. There are no hypoglycemic complications. Symptoms are worsening. There are no diabetic complications. Risk factors for coronary artery disease include dyslipidemia, diabetes mellitus, hypertension, male sex, obesity and sedentary lifestyle. Current diabetic treatment includes oral agent (monotherapy). His weight is stable. He is  following a generally unhealthy diet. He has not had a previous visit with a dietitian. He never participates in exercise. (He is initiated on Lantus 30 units daily at bedtime by his PMD. He did not bring any meter nor logs to review today. His most recent A1c was high at 14.1%.) An ACE inhibitor/angiotensin II receptor blocker is being taken. Eye exam is current.  Hyperlipidemia  This is a chronic problem. The current episode started more than 1 year ago. Pertinent negatives include no chest pain, myalgias or shortness of breath. Current antihyperlipidemic treatment includes statins. Risk factors for coronary artery disease include diabetes mellitus, hypertension, male sex, obesity and a sedentary lifestyle.  Hypertension  This is a chronic problem. The current episode started more than 1 year ago. The problem is controlled. Associated symptoms include blurred vision. Pertinent negatives include no chest pain, headaches, palpitations or shortness of breath. Risk factors for coronary artery disease include dyslipidemia, diabetes mellitus, male gender, obesity and sedentary lifestyle.    Review of Systems  Constitutional: Negative for chills and fever.  Eyes: Positive for blurred vision and visual disturbance.  Respiratory: Negative for cough and shortness of breath.   Cardiovascular: Negative for chest pain and palpitations.       No Shortness of breath  Gastrointestinal: Negative for abdominal pain, diarrhea, nausea and vomiting.  Endocrine: Positive for polydipsia and polyuria.  Genitourinary: Negative for frequency, hematuria and urgency.  Musculoskeletal: Negative for myalgias.  Skin: Negative for rash.  Neurological: Negative for tremors, seizures and headaches.  Hematological: Does not bruise/bleed easily.  Psychiatric/Behavioral: Negative for hallucinations and suicidal ideas.    Objective:    BP (!) 141/89   Pulse 91   Ht '5\' 8"'  (1.727 m)   Wt 198 lb (89.8 kg)   BMI 30.11 kg/m    Wt Readings from Last 3 Encounters:  03/01/17 198 lb (89.8 kg)  01/10/17 126 lb 4 oz (57.3 kg)  01/04/17 192 lb 8 oz (87.3 kg)    Physical Exam  Constitutional: He is oriented to person, place, and time. He appears well-developed and well-nourished. He is cooperative. No distress.  HENT:  Head: Normocephalic and atraumatic.  Eyes: EOM are normal.  Neck: Normal range of motion. Neck supple. No tracheal deviation present. No thyromegaly present.  Cardiovascular: Normal rate, S1 normal, S2 normal and normal heart sounds.  Exam reveals no gallop.   No murmur heard. Pulses:      Dorsalis pedis pulses are 1+ on the right side, and 1+ on the left side.       Posterior tibial pulses are 1+ on the right side, and 1+ on the left side.  Pulmonary/Chest: Breath sounds normal. No respiratory distress. He has no wheezes.  Abdominal: Soft. Bowel sounds are normal. He exhibits no distension. There is no tenderness. There is no guarding and no CVA tenderness.  Musculoskeletal: He exhibits no edema.       Right shoulder: He exhibits no swelling and no deformity.  Neurological: He is alert and oriented to person, place, and time. He has normal strength and normal reflexes. No  cranial nerve deficit or sensory deficit. Gait normal.  Skin: Skin is warm and dry. No rash noted. No cyanosis. Nails show no clubbing.  Psychiatric: He has a normal mood and affect. His speech is normal and behavior is normal. Judgment and thought content normal. Cognition and memory are normal.     CMP ( most recent) CMP     Component Value Date/Time   NA 134 (L) 12/28/2016 1645   K 3.9 12/28/2016 1645   CL 96 (L) 12/28/2016 1645   CO2 27 12/28/2016 1645   GLUCOSE 366 (H) 12/28/2016 1645   BUN 14 12/28/2016 1645   CREATININE 1.22 12/28/2016 1645   CREATININE 1.25 10/23/2015 0723   CALCIUM 10.0 12/28/2016 1645   PROT 7.9 12/30/2016 0950   ALBUMIN 4.1 12/30/2016 0950   AST 27 12/30/2016 0950   ALT 34 12/30/2016 0950    ALKPHOS 95 12/30/2016 0950   BILITOT 0.8 12/30/2016 0950   GFRNONAA >60 12/28/2016 1645   GFRNONAA 64 10/23/2015 0723   GFRAA >60 12/28/2016 1645   GFRAA 74 10/23/2015 0723    Diabetic Labs (most recent): Lab Results  Component Value Date   HGBA1C 14.1 (H) 12/30/2016   HGBA1C >14% 12/28/2016   HGBA1C 12.3 (H) 10/23/2015     Lipid Panel ( most recent) Lipid Panel     Component Value Date/Time   CHOL 378 (H) 12/30/2016 0950   TRIG 319 (H) 12/30/2016 0950   HDL 39 (L) 12/30/2016 0950   CHOLHDL 9.7 12/30/2016 0950   VLDL 64 (H) 12/30/2016 0950   LDLCALC 275 (H) 12/30/2016 0950     Assessment & Plan:   1. Diabetes mellitus type 2 in obese [facility])  - Patient has currently uncontrolled symptomatic type 2 DM since  56 years of age. - This patient was seen last year for diabetes management, was supposed to return for follow-up appointment. He never showed up. He is now reappearing with A1c higher at 14.1%. He did not bring any meter nor logs to review with him   He is diabetes is complicated by obesity , noncompliance/nonadherence, and patient remains at a high risk for more acute and chronic complications of diabetes which include CAD, CVA, CKD, retinopathy, and neuropathy. These are all discussed in detail with the patient.  - I have counseled the patient on diet management and weight loss, by adopting a carbohydrate restricted/protein rich diet.  - Suggestion is made for him to avoid simple carbohydrates  from his diet including Cakes, Sweet Desserts, Ice Cream, Soda (diet and regular), Sweet Tea, Candies, Chips, Cookies, Store Bought Juices, Alcohol in Excess of  1-2 drinks a day, Artificial Sweeteners, and "Sugar-free" Products. This will help patient to have stable blood glucose profile and potentially avoid unintended weight gain.  - I encouraged the patient to switch to  unprocessed or minimally processed complex starch and increased protein intake (animal or plant source),  fruits, and vegetables.  - Patient is advised to stick to a routine mealtimes to eat 3 meals  a day and avoid unnecessary snacks ( to snack only to correct hypoglycemia).  - The patient will be scheduled with Jearld Fenton, RDN, CDE for individualized DM education.  - I have approached patient with the following individualized plan to manage diabetes and patient agrees:   - He came with loss of control of his diabetes, did not bring any meter nor logs. - I approached him to start monitoring blood glucose 4 times a day before meals and  at bedtime and return in one week with his meter and logs. -In the meantime, I have advised him to increase his Lantus to 40 units daily at bedtime.  -Patient is encouraged to call clinic for blood glucose levels less than 70 or above 300 mg /dl.  -He is not on metformin 500 mg by mouth twice a day. I have advised him to continue.  - Patient will be considered for incretin therapy as appropriate next visit. - Patient specific target  A1c;  LDL, HDL, Triglycerides, and  Waist Circumference were discussed in detail.  2) BP/HTN: uncontrolled. Continue current medications including ACEI/ARB. 3) Lipids/HPL:  uncontrolled,  LDL extremity elevated at 275, have advised him to be consistent in taking his simvastatin 20 mg by mouth daily at bedtime. Side effects and precautions discussed with him.   4)  Weight/Diet: CDE Consult will be initiated , exercise, and detailed carbohydrates information provided.  5) Chronic Care/Health Maintenance:  -Patient is on ACEI/ARB and Statin medications and encouraged to continue to follow up with Ophthalmology, Podiatrist at least yearly or according to recommendations, and advised to   stay away from smoking. I have recommended yearly flu vaccine and pneumonia vaccination at least every 5 years; moderate intensity exercise for up to 150 minutes weekly; and  sleep for at least 7 hours a day.  - - Time spent with the patient: 25  min, of which >50% was spent in reviewing his medications, reviewing his current and  previous labs and insulin doses and developing a plan to avoid hypo- and hyper-glycemia.   - Patient to bring meter and  blood glucose logs during their next visit.   - I advised patient to maintain close follow up with Fayrene Helper, MD for primary care needs.  Follow up plan: - Return in about 1 week (around 03/08/2017) for follow up with meter and logs- no labs.  Glade Lloyd, MD Phone: [phone]  Fax: [phone]  This note was partially dictated with voice recognition software. Similar sounding words can be transcribed inadequately or may not  be corrected upon review.  03/01/2017, 11:43 AM

## 2017-03-01 NOTE — Patient Instructions (Signed)

## 2017-03-08 ENCOUNTER — Ambulatory Visit (INDEPENDENT_AMBULATORY_CARE_PROVIDER_SITE_OTHER): Payer: Medicaid Other | Admitting: "Endocrinology

## 2017-03-08 ENCOUNTER — Encounter: Payer: Self-pay | Admitting: "Endocrinology

## 2017-03-08 VITALS — BP 148/87 | HR 92 | Ht 68.0 in | Wt 201.0 lb

## 2017-03-08 DIAGNOSIS — E1169 Type 2 diabetes mellitus with other specified complication: Secondary | ICD-10-CM | POA: Diagnosis not present

## 2017-03-08 DIAGNOSIS — Z91199 Patient's noncompliance with other medical treatment and regimen due to unspecified reason: Secondary | ICD-10-CM

## 2017-03-08 DIAGNOSIS — I10 Essential (primary) hypertension: Secondary | ICD-10-CM

## 2017-03-08 DIAGNOSIS — E669 Obesity, unspecified: Secondary | ICD-10-CM

## 2017-03-08 DIAGNOSIS — E6609 Other obesity due to excess calories: Secondary | ICD-10-CM

## 2017-03-08 DIAGNOSIS — Z9119 Patient's noncompliance with other medical treatment and regimen: Secondary | ICD-10-CM

## 2017-03-08 DIAGNOSIS — Z6830 Body mass index (BMI) 30.0-30.9, adult: Secondary | ICD-10-CM | POA: Diagnosis not present

## 2017-03-08 DIAGNOSIS — E782 Mixed hyperlipidemia: Secondary | ICD-10-CM | POA: Diagnosis not present

## 2017-03-08 MED ORDER — INSULIN GLARGINE 100 UNIT/ML SOLOSTAR PEN: 50.0000 [IU] | PEN_INJECTOR | Freq: Every day | SUBCUTANEOUS | 2 refills | Status: DC

## 2017-03-08 MED ORDER — GLUCOSE BLOOD VI STRP: ORAL_STRIP | 3 refills | Status: DC

## 2017-03-08 NOTE — Progress Notes (Signed)
Subjective:    Patient ID: John [name], male    DOB: 1960/10/08. Patient is being seen in f/u  for management of diabetes requested by  Fayrene Helper, MD  Past Medical History:  Diagnosis Date  . Anxiety   . Depression 2007  . Diabetes mellitus without complication ([place])   . Hyperlipidemia   . Hypertension   . Hypogonadism male    Past Surgical History:  Procedure Laterality Date  . COLONOSCOPY  01/20/2011   Procedure: COLONOSCOPY;  Surgeon: Dorothyann Peng, MD;  Location: [facility];  Service: Endoscopy;  Laterality: N/A;  . HERNIA REPAIR     ventral hernia repair    Social History   Social History  . Marital status: Married    Spouse name: N/A  . Number of children: 1  . Years of education: N/A   Occupational History  . Production assistant, radio    Social History Main Topics  . Smoking status: Never Smoker  . Smokeless tobacco: Never Used  . Alcohol use 0.6 oz/week    1 Cans of beer per week     Comment: occasional beer socially.   . Drug use: No  . Sexual activity: Yes   Other Topics Concern  . None   Social History Narrative  . None   Outpatient Encounter Prescriptions as of 03/08/2017  Medication Sig  . ALPRAZolam (XANAX) 1 MG tablet Take 1 tablet (1 mg total) by mouth daily as needed for anxiety.  [name] atorvastatin (LIPITOR) 20 MG tablet Take 1 tablet (20 mg total) by mouth daily.  . benazepril (LOTENSIN) 10 MG tablet Take 1 tablet (10 mg total) by mouth daily.  . blood glucose meter kit and supplies KIT Dispense based on patient and insurance preference. Use up to four times daily as directed. (FOR ICD-9 250.00, 250.01).  [name] glucose blood (ACCU-CHEK AVIVA) test strip Use to test blood glucose 4 times a day.  . hydrochlorothiazide (HYDRODIURIL) 25 MG tablet Take 1 tablet (25 mg total) by mouth daily.  . Insulin Glargine (LANTUS SOLOSTAR) 100 UNIT/ML Solostar Pen Inject 50 Units into the skin daily at 10 pm.  . metFORMIN (GLUCOPHAGE) 500 MG tablet Half tablet  twice daily  . [DISCONTINUED] Insulin Glargine (LANTUS SOLOSTAR) 100 UNIT/ML Solostar Pen Inject 40 Units into the skin daily at 10 pm.   No facility-administered encounter medications on file as of 03/08/2017.    ALLERGIES: Allergies  Allergen Reactions  . Januvia [Sitagliptin] Nausea And Vomiting  . Metformin And Related Nausea Only  . Poison Oak Extract [Poison Oak Extract] Dermatitis   VACCINATION STATUS: Immunization History  Administered Date(s) Administered  . Influenza Whole 03/18/2010, 04/13/2011  . Influenza,inj,Quad PF,6+ Mos 05/20/2013, 05/08/2014, 06/11/2015  . Pneumococcal Polysaccharide-23 11/26/2013  . Td 09/15/2009    Diabetes  He presents for his follow-up diabetic visit. He has type 2 diabetes mellitus. Onset time: He was diagnosed at age 59. His disease course has been improving (Patient was supposed to return for a follow-up visit after his last August 2017, unfortunately he never showed up.). There are no hypoglycemic associated symptoms. Pertinent negatives for hypoglycemia include no headaches, seizures or tremors. Associated symptoms include blurred vision, polydipsia and polyuria. Pertinent negatives for diabetes include no chest pain. There are no hypoglycemic complications. Symptoms are improving. There are no diabetic complications. Risk factors for coronary artery disease include dyslipidemia, diabetes mellitus, hypertension, male sex, obesity and sedentary lifestyle. Current diabetic treatment includes oral agent (monotherapy). His weight  is increasing steadily. He is following a generally unhealthy diet. He has not had a previous visit with a dietitian. He never participates in exercise. His breakfast blood glucose range is generally 180-200 mg/dl. (He was supposed to present with for tests daily, unfortunately presents with only fasting blood glucose monitoring which are improving. He says that he did not have enough test strips which she did not report  about. His most recent A1c was high at 14.1%.) An ACE inhibitor/angiotensin II receptor blocker is being taken. Eye exam is current.  Hyperlipidemia  This is a chronic problem. The current episode started more than 1 year ago. Pertinent negatives include no chest pain, myalgias or shortness of breath. Current antihyperlipidemic treatment includes statins. Risk factors for coronary artery disease include diabetes mellitus, hypertension, male sex, obesity and a sedentary lifestyle.  Hypertension  This is a chronic problem. The current episode started more than 1 year ago. The problem is controlled. Associated symptoms include blurred vision. Pertinent negatives include no chest pain, headaches, palpitations or shortness of breath. Risk factors for coronary artery disease include dyslipidemia, diabetes mellitus, male gender, obesity and sedentary lifestyle.    Review of Systems  Constitutional: Negative for chills and fever.  Eyes: Positive for blurred vision and visual disturbance.  Respiratory: Negative for cough and shortness of breath.   Cardiovascular: Negative for chest pain and palpitations.       No Shortness of breath  Gastrointestinal: Negative for abdominal pain, diarrhea, nausea and vomiting.  Endocrine: Positive for polydipsia and polyuria.  Genitourinary: Negative for frequency, hematuria and urgency.  Musculoskeletal: Negative for myalgias.  Skin: Negative for rash.  Neurological: Negative for tremors, seizures and headaches.  Hematological: Does not bruise/bleed easily.  Psychiatric/Behavioral: Negative for hallucinations and suicidal ideas.    Objective:    BP (!) 148/87   Pulse 92   Ht _0  (1.727 m)   Wt 201 lb (91.2 kg)   BMI 30.56 kg/m   Wt Readings from Last 3 Encounters:  03/08/17 201 lb (91.2 kg)  03/01/17 198 lb (89.8 kg)  01/10/17 126 lb 4 oz (57.3 kg)    Physical Exam  Constitutional: He is oriented to person, place, and time. He appears well-developed  and well-nourished. He is cooperative. No distress.  HENT:  Head: Normocephalic and atraumatic.  Eyes: EOM are normal.  Neck: Normal range of motion. Neck supple. No tracheal deviation present. No thyromegaly present.  Cardiovascular: Normal rate, S1 normal, S2 normal and normal heart sounds.  Exam reveals no gallop.   No murmur heard. Pulses:      Dorsalis pedis pulses are 1+ on the right side, and 1+ on the left side.       Posterior tibial pulses are 1+ on the right side, and 1+ on the left side.  Pulmonary/Chest: Breath sounds normal. No respiratory distress. He has no wheezes.  Abdominal: Soft. Bowel sounds are normal. He exhibits no distension. There is no tenderness. There is no guarding and no CVA tenderness.  Musculoskeletal: He exhibits no edema.       Right shoulder: He exhibits no swelling and no deformity.  Neurological: He is alert and oriented to person, place, and time. He has normal strength and normal reflexes. No cranial nerve deficit or sensory deficit. Gait normal.  Skin: Skin is warm and dry. No rash noted. No cyanosis. Nails show no clubbing.  Psychiatric: He has a normal mood and affect. His speech is normal and behavior is normal. Judgment and  thought content normal. Cognition and memory are normal.     CMP ( most recent) CMP     Component Value Date/Time   NA 134 (L) 12/28/2016 1645   K 3.9 12/28/2016 1645   CL 96 (L) 12/28/2016 1645   CO2 27 12/28/2016 1645   GLUCOSE 366 (H) 12/28/2016 1645   BUN 14 12/28/2016 1645   CREATININE 1.22 12/28/2016 1645   CREATININE 1.25 10/23/2015 0723   CALCIUM 10.0 12/28/2016 1645   PROT 7.9 12/30/2016 0950   ALBUMIN 4.1 12/30/2016 0950   AST 27 12/30/2016 0950   ALT 34 12/30/2016 0950   ALKPHOS 95 12/30/2016 0950   BILITOT 0.8 12/30/2016 0950   GFRNONAA >60 12/28/2016 1645   GFRNONAA 64 10/23/2015 0723   GFRAA >60 12/28/2016 1645   GFRAA 74 10/23/2015 0723    Diabetic Labs (most recent): Lab Results  Component  Value Date   HGBA1C 14.1 (H) 12/30/2016   HGBA1C >14% 12/28/2016   HGBA1C 12.3 (H) 10/23/2015     Lipid Panel ( most recent) Lipid Panel     Component Value Date/Time   CHOL 378 (H) 12/30/2016 0950   TRIG 319 (H) 12/30/2016 0950   HDL 39 (L) 12/30/2016 0950   CHOLHDL 9.7 12/30/2016 0950   VLDL 64 (H) 12/30/2016 0950   LDLCALC 275 (H) 12/30/2016 0950     Assessment & Plan:   1. Diabetes mellitus type 2 in obese [facility])  - Patient has currently uncontrolled symptomatic type 2 DM since  56 years of age. - After long absence from clinic, he was seen last week with loss of control of type 2 diabetes with A1c of 14.1%.   - He was supposed to present with 4 tests daily, however presents only with fasting blood glucose profile which are improving but not yet on target.   He is diabetes is complicated by obesity , noncompliance/nonadherence, and patient remains at a high risk for more acute and chronic complications of diabetes which include CAD, CVA, CKD, retinopathy, and neuropathy. These are all discussed in detail with the patient.  - I have counseled the patient on diet management and weight loss, by adopting a carbohydrate restricted/protein rich diet.  - Suggestion is made for him to avoid simple carbohydrates  from his diet including Cakes, Sweet Desserts, Ice Cream, Soda (diet and regular), Sweet Tea, Candies, Chips, Cookies, Store Bought Juices, Alcohol in Excess of  1-2 drinks a day, Artificial Sweeteners, and "Sugar-free" Products. This will help patient to have stable blood glucose profile and potentially avoid unintended weight gain.   - I encouraged the patient to switch to  unprocessed or minimally processed complex starch and increased protein intake (animal or plant source), fruits, and vegetables.  - Patient is advised to stick to a routine mealtimes to eat 3 meals  a day and avoid unnecessary snacks ( to snack only to correct hypoglycemia).   - I have approached patient  with the following individualized plan to manage diabetes and patient agrees:   - He came only with fasting blood glucose profile which are improving. -  There is some room on his Lantus , I would increase his Lantus to 50 units daily at bedtime. He still needs to do blood glucose monitoring 4 times a day-before meals and at bedtime and return in 2 weeks with his meter and logs.   - He will likely require prandial insulin to achieve control of diabetes to target.   -Patient is encouraged to  call clinic for blood glucose levels less than 70 or above 300 mg /dl.  -He is not on metformin 500 mg by mouth twice a day. I have advised him to continue.  - Patient will be considered for incretin therapy as appropriate next visit. - Patient specific target  A1c;  LDL, HDL, Triglycerides, and  Waist Circumference were discussed in detail.  2) BP/HTN: uncontrolled. Continue current medications including ACEI/ARB. 3) Lipids/HPL:  uncontrolled,  LDL extremity elevated at 275, have advised him to be consistent in taking his simvastatin 20 mg by mouth daily at bedtime. Side effects and precautions discussed with him.   4)  Weight/Diet: CDE Consult will be initiated , exercise, and detailed carbohydrates information provided.  5) Chronic Care/Health Maintenance:  -Patient is on ACEI/ARB and Statin medications and encouraged to continue to follow up with Ophthalmology, Podiatrist at least yearly or according to recommendations, and advised to   stay away from smoking. I have recommended yearly flu vaccine and pneumonia vaccination at least every 5 years; moderate intensity exercise for up to 150 minutes weekly; and  sleep for at least 7 hours a day.  - Time spent with the patient: 25 min, of which >50% was spent in reviewing his sugar logs , discussing his hypo- and hyper-glycemic episodes, reviewing his current and  previous labs and insulin doses and developing a plan to avoid hypo- and hyper-glycemia.   -  I advised patient to maintain close follow up with Fayrene Helper, MD for primary care needs.  Follow up plan: - Return in about 2 weeks (around 03/22/2017) for follow up with meter and logs- no labs.  Glade Lloyd, MD Phone: [phone]  Fax: [phone]   This note was partially dictated with voice recognition software. Similar sounding words can be transcribed inadequately or may not  be corrected upon review.  03/08/2017, 8:58 AM

## 2017-03-08 NOTE — Patient Instructions (Signed)

## 2017-03-21 ENCOUNTER — Ambulatory Visit (HOSPITAL_COMMUNITY): Payer: Self-pay | Admitting: Psychiatry

## 2017-03-22 ENCOUNTER — Ambulatory Visit (HOSPITAL_COMMUNITY): Payer: Self-pay | Admitting: Psychiatry

## 2017-03-22 ENCOUNTER — Telehealth: Payer: Self-pay | Admitting: Family Medicine

## 2017-03-22 ENCOUNTER — Encounter: Payer: Self-pay | Admitting: "Endocrinology

## 2017-03-22 ENCOUNTER — Ambulatory Visit: Payer: Medicaid Other | Admitting: "Endocrinology

## 2017-03-22 NOTE — Telephone Encounter (Signed)
RETURNED PATIENTS CALL, DID NOT LEAVE VM BECAUSE I HAVE NO DETAILS AS TO WHY HE CALLED HE ONLY LEFT HIS PHONE #

## 2017-03-30 ENCOUNTER — Other Ambulatory Visit (HOSPITAL_COMMUNITY): Payer: Self-pay | Admitting: Psychiatry

## 2017-03-30 ENCOUNTER — Ambulatory Visit (INDEPENDENT_AMBULATORY_CARE_PROVIDER_SITE_OTHER): Payer: Medicaid Other | Admitting: Psychiatry

## 2017-03-30 ENCOUNTER — Encounter (HOSPITAL_COMMUNITY): Payer: Self-pay | Admitting: Psychiatry

## 2017-03-30 VITALS — BP 138/86 | HR 82 | Ht 68.0 in | Wt 200.6 lb

## 2017-03-30 DIAGNOSIS — G479 Sleep disorder, unspecified: Secondary | ICD-10-CM | POA: Diagnosis not present

## 2017-03-30 DIAGNOSIS — Z634 Disappearance and death of family member: Secondary | ICD-10-CM | POA: Diagnosis not present

## 2017-03-30 DIAGNOSIS — Z79899 Other long term (current) drug therapy: Secondary | ICD-10-CM | POA: Diagnosis not present

## 2017-03-30 DIAGNOSIS — F329 Major depressive disorder, single episode, unspecified: Secondary | ICD-10-CM

## 2017-03-30 DIAGNOSIS — F322 Major depressive disorder, single episode, severe without psychotic features: Secondary | ICD-10-CM

## 2017-03-30 DIAGNOSIS — F419 Anxiety disorder, unspecified: Secondary | ICD-10-CM

## 2017-03-30 DIAGNOSIS — F4310 Post-traumatic stress disorder, unspecified: Secondary | ICD-10-CM | POA: Diagnosis not present

## 2017-03-30 MED ORDER — ALPRAZOLAM 1 MG PO TABS: 1.0000 mg | ORAL_TABLET | Freq: Every day | ORAL | 2 refills | Status: DC | PRN

## 2017-03-30 MED ORDER — FLUOXETINE HCL 20 MG PO CAPS: 20.0000 mg | ORAL_CAPSULE | Freq: Every day | ORAL | 2 refills | Status: DC
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
## 2017-04-06 ENCOUNTER — Other Ambulatory Visit: Payer: Self-pay

## 2017-04-06 LAB — COMPLETE METABOLIC PANEL WITH GFR
AG Ratio: 1.3 (calc) (ref 1.0–2.5)
ALKALINE PHOSPHATASE (APISO): 101 U/L (ref 40–115)
ALT: 55 U/L — ABNORMAL HIGH (ref 9–46)
AST: 24 U/L (ref 10–35)
Albumin: 3.9 g/dL (ref 3.6–5.1)
BILIRUBIN TOTAL: 0.6 mg/dL (ref 0.2–1.2)
BUN: 11 mg/dL (ref 7–25)
CO2: 30 mmol/L (ref 20–32)
CREATININE: 1.14 mg/dL (ref 0.70–1.33)
Calcium: 9.5 mg/dL (ref 8.6–10.3)
Chloride: 103 mmol/L (ref 98–110)
GFR, EST NON AFRICAN AMERICAN: 71 mL/min/{1.73_m2} (ref >=60)
GFR, Est African American: 82 mL/min/{1.73_m2} (ref >=60)
GLUCOSE: 108 mg/dL — AB (ref 65–99)
Globulin: 3.1 g/dL (calc) (ref 1.9–3.7)
POTASSIUM: 3.8 mmol/L (ref 3.5–5.3)
SODIUM: 140 mmol/L (ref 135–146)
Total Protein: 7 g/dL (ref 6.1–8.1)

## 2017-04-06 LAB — LIPID PANEL
CHOL/HDL RATIO: 6.4 (calc) — AB (ref <5.0)
Cholesterol: 229 mg/dL — ABNORMAL HIGH (ref <200)
HDL: 36 mg/dL — ABNORMAL LOW (ref >40)
LDL CHOLESTEROL (CALC): 164 mg/dL — AB
NON-HDL CHOLESTEROL (CALC): 193 mg/dL — AB (ref <130)
TRIGLYCERIDES: 148 mg/dL (ref <150)

## 2017-04-06 LAB — HEMOGLOBIN A1C
HEMOGLOBIN A1C: 9.1 %{Hb} — AB (ref <5.7)
MEAN PLASMA GLUCOSE: 214 (calc)
eAG (mmol/L): 11.9 (calc)

## 2017-04-06 MED ORDER — HYDROCHLOROTHIAZIDE 25 MG PO TABS: 25.0000 mg | ORAL_TABLET | Freq: Every day | ORAL | 0 refills | Status: DC

## 2017-04-10 ENCOUNTER — Other Ambulatory Visit: Payer: Self-pay

## 2017-04-13 ENCOUNTER — Other Ambulatory Visit: Payer: Self-pay | Admitting: "Endocrinology

## 2017-04-13 MED ORDER — LISINOPRIL-HYDROCHLOROTHIAZIDE 10-12.5 MG PO TABS: 1.0000 | ORAL_TABLET | Freq: Every day | ORAL | 3 refills | Status: DC

## 2017-04-18 ENCOUNTER — Encounter: Payer: Self-pay | Admitting: Family Medicine

## 2017-04-18 ENCOUNTER — Ambulatory Visit (INDEPENDENT_AMBULATORY_CARE_PROVIDER_SITE_OTHER): Payer: Medicaid Other | Admitting: Family Medicine

## 2017-04-18 VITALS — BP 108/70 | HR 81 | Temp 98.0°F | Resp 16 | Ht 68.0 in | Wt 201.2 lb

## 2017-04-18 DIAGNOSIS — E663 Overweight: Secondary | ICD-10-CM

## 2017-04-18 DIAGNOSIS — Z23 Encounter for immunization: Secondary | ICD-10-CM

## 2017-04-18 DIAGNOSIS — I10 Essential (primary) hypertension: Secondary | ICD-10-CM

## 2017-04-18 DIAGNOSIS — E782 Mixed hyperlipidemia: Secondary | ICD-10-CM | POA: Diagnosis not present

## 2017-04-18 DIAGNOSIS — F418 Other specified anxiety disorders: Secondary | ICD-10-CM | POA: Diagnosis not present

## 2017-04-18 DIAGNOSIS — E669 Obesity, unspecified: Secondary | ICD-10-CM

## 2017-04-18 DIAGNOSIS — E1169 Type 2 diabetes mellitus with other specified complication: Secondary | ICD-10-CM | POA: Diagnosis not present

## 2017-04-18 MED ORDER — ATORVASTATIN CALCIUM 40 MG PO TABS: 40.0000 mg | ORAL_TABLET | Freq: Every day | ORAL | 3 refills | Status: DC

## 2017-04-18 NOTE — Patient Instructions (Addendum)
F/u in 3.5 month, call if you need me before  Fasting lipid, cmp and EGFr ,  PSA  1 week before follow up  Increase  Atorvastatin to 40 mg daily, take TWO 20 mg tablets daily until done Cut back on fried and fatty foods please  Flu vaccination today  It is important that you exercise regularly at least 30 minutes 5 times a week. If you develop chest pain, have severe difficulty breathing, or feel very tired, stop exercising immediately and seek medical attention   Please work on good  health habits so that your health will improve. 1. Commitment to daily physical activity for 30 to 60  minutes, if you are able to do this.  2. Commitment to wise food choices. Aim for half of your  food intake to be vegetable and fruit, one quarter starchy foods, and one quarter protein. Try to eat on a regular schedule  3 meals per day, snacking between meals should be limited to vegetables or fruits or small portions of nuts. 64 ounces of water per day is generally recommended, unless you have specific health conditions, like heart failure or kidney failure where you will need to limit fluid intake.  3. Commitment to sufficient and a  good quality of physical and mental rest daily, generally between 6 to 8 hours per day.  WITH PERSISTANCE AND PERSEVERANCE, THE IMPOSSIBLE , BECOMES THE NORM!

## 2017-04-18 NOTE — Assessment & Plan Note (Signed)
Managed by endo and improved Foot exam today John [name] is reminded of the importance of commitment to daily physical activity for 30 minutes or more, as able and the need to limit carbohydrate intake to 30 to 60 grams per meal to help with blood sugar control.   The need to take medication as prescribed, test blood sugar as directed, and to call between visits if there is a concern that blood sugar is uncontrolled is also discussed.   John [name] is reminded of the importance of daily foot exam, annual eye examination, and good blood sugar, blood pressure and cholesterol control.  Diabetic Labs Latest Ref Rng & Units 04/05/2017 12/30/2016 12/28/2016 09/18/2016 01/26/2016  HbA1c <5.7 % of total Hgb 9.1(H) 14.1(H) >14% - -  Microalbumin Not Estab. ug/mL - 134.8(H) - - -  Micro/Creat Ratio <30 mcg/mg creat - - - - -  Chol <200 mg/dL 829(F229(H) 621(H378(H) - - 086139  HDL >40 mg/dL 57(Q36(L) 46(N39(L) - - 62(X39(L)  Calc LDL 0 - 99 mg/dL - 528(U275(H) - - 84  Triglycerides <150 mg/dL 132148 440(N319(H) - - 79  Creatinine 0.70 - 1.33 mg/dL 0.271.14 - 2.531.22 6.64(Q1.28(H) -   BP/Weight 04/18/2017 03/08/2017 03/01/2017 01/10/2017 01/04/2017 01/03/2017 12/29/2016  Systolic BP 108 148 141 140 130 169 128  Diastolic BP 70 87 89 90 88 99 80  Wt. (Lbs) 201.25 201 198 126.25 192.5 - 193.75  BMI 30.6 30.56 30.11 19.2 30.15 - 30.35  Some encounter information is confidential and restricted. Go to Review Flowsheets activity to see all data.   Foot/eye exam completion dates 04/18/2017 01/26/2016  Foot Form Completion Done Done

## 2017-04-18 NOTE — Assessment & Plan Note (Signed)
Deteriorated. Patient re-educated about  the importance of commitment to a  minimum of 150 minutes of exercise per week.  The importance of healthy food choices with portion control discussed. Encouraged to start a food diary, count calories and to consider  joining a support group. Sample diet sheets offered. Goals set by the patient for the next several months.   Weight /BMI 04/18/2017 03/08/2017 03/01/2017  WEIGHT 201 lb 4 oz 201 lb 198 lb  HEIGHT 5\' 8"  5\' 8"  5\' 8"   BMI 30.6 kg/m2 30.56 kg/m2 30.11 kg/m2  Some encounter information is confidential and restricted. Go to Review Flowsheets activity to see all data.

## 2017-04-18 NOTE — Assessment & Plan Note (Signed)
Managed by psych , improved slightly

## 2017-04-18 NOTE — Assessment & Plan Note (Signed)
Controlled, no change in medication DASH diet and commitment to daily physical activity for a minimum of 30 minutes discussed and encouraged, as a part of hypertension management. The importance of attaining a healthy weight is also discussed.  BP/Weight 04/18/2017 03/08/2017 03/01/2017 01/10/2017 01/04/2017 01/03/2017 12/29/2016  Systolic BP 108 148 141 140 130 169 128  Diastolic BP 70 87 89 90 88 99 80  Wt. (Lbs) 201.25 201 198 126.25 192.5 - 193.75  BMI 30.6 30.56 30.11 19.2 30.15 - 30.35  Some encounter information is confidential and restricted. Go to Review Flowsheets activity to see all data.

## 2017-04-18 NOTE — Progress Notes (Signed)
John RoundGary K [name]     MRN: [id number]      DOB: July 26, 1960   HPI John [name] is here for follow up and re-evaluation of chronic medical conditions, medication management and review of any available recent lab and radiology data.  Preventive health is updated, specifically  Cancer screening and Immunization.   Questions or concerns regarding consultations or procedures which the PT has had in the interim are  addressed. The PT denies any adverse reactions to current medications since the last visit.  Denies polyuria, polydipsia, blurred vision , or hypoglycemic episodes.   ROS Denies recent fever or chills. Denies sinus pressure, nasal congestion, ear pain or sore throat. Denies chest congestion, productive cough or wheezing. Denies chest pains, palpitations and leg swelling Denies abdominal pain, nausea, vomiting,diarrhea or constipation.   Denies dysuria, frequency, hesitancy or incontinence. Denies joint pain, swelling and limitation in mobility. Denies headaches, seizures, numbness, or tingling. Denies  Uncontrolled depression, anxiety or insomnia. Denies skin break down or rash.   PE  BP 108/70 (BP Location: Left Arm, Patient Position: Sitting, Cuff Size: Normal)   Pulse 81   Temp 98 F (36.7 C) (Other (Comment))   Resp 16   Ht 5\' 8"  (1.727 m)   Wt 201 lb 4 oz (91.3 kg)   SpO2 94%   BMI 30.60 kg/m   Patient alert and oriented and in no cardiopulmonary distress.  HEENT: No facial asymmetry, EOMI,   oropharynx pink and moist.  Neck supple no JVD, no mass.  Chest: Clear to auscultation bilaterally.  CVS: S1, S2 no murmurs, no S3.Regular rate.  ABD: Soft non tender.   Ext: No edema  MS: Adequate ROM spine, shoulders, hips and knees.  Skin: Intact, no ulcerations or rash noted.  Psych: Good eye contact, normal affect. Memory intact not anxious or depressed appearing.  CNS: CN 2-12 intact, power,  normal throughout.no focal deficits noted.   Assessment &  Plan  Mixed hyperlipidemia Uncontrolled though improved increase lipitor dose and reduce fat intake. rept lab in 3.5 monh Hyperlipidemia:Low fat diet discussed and encouraged.   Lipid Panel  Lab Results  Component Value Date   CHOL 229 (H) 04/05/2017   HDL 36 (L) 04/05/2017   LDLCALC 275 (H) 12/30/2016   TRIG 148 04/05/2017   CHOLHDL 6.4 (H) 04/05/2017       Diabetes mellitus type 2 in obese (HCC) Managed by endo and improved Foot exam today John [name] is reminded of the importance of commitment to daily physical activity for 30 minutes or more, as able and the need to limit carbohydrate intake to 30 to 60 grams per meal to help with blood sugar control.   The need to take medication as prescribed, test blood sugar as directed, and to call between visits if there is a concern that blood sugar is uncontrolled is also discussed.   John [name] is reminded of the importance of daily foot exam, annual eye examination, and good blood sugar, blood pressure and cholesterol control.  Diabetic Labs Latest Ref Rng & Units 04/05/2017 12/30/2016 12/28/2016 09/18/2016 01/26/2016  HbA1c <5.7 % of total Hgb 9.1(H) 14.1(H) >14% - -  Microalbumin Not Estab. ug/mL - 134.8(H) - - -  Micro/Creat Ratio <30 mcg/mg creat - - - - -  Chol <200 mg/dL 562(Z229(H) 308(M378(H) - - 578139  HDL >40 mg/dL 46(N36(L) 62(X39(L) - - 52(W39(L)  Calc LDL 0 - 99 mg/dL - 413(K275(H) - - 84  Triglycerides <150 mg/dL 440148 102(V319(H) - -  79  Creatinine 0.70 - 1.33 mg/dL 0.45 - 4.09 8.11(B) -   BP/Weight 04/18/2017 03/08/2017 03/01/2017 01/10/2017 01/04/2017 01/03/2017 12/29/2016  Systolic BP 108 148 141 140 130 169 128  Diastolic BP 70 87 89 90 88 99 80  Wt. (Lbs) 201.25 201 198 126.25 192.5 - 193.75  BMI 30.6 30.56 30.11 19.2 30.15 - 30.35  Some encounter information is confidential and restricted. Go to Review Flowsheets activity to see all data.   Foot/eye exam completion dates 04/18/2017 01/26/2016  Foot Form Completion Done Done        Essential  hypertension Controlled, no change in medication DASH diet and commitment to daily physical activity for a minimum of 30 minutes discussed and encouraged, as a part of hypertension management. The importance of attaining a healthy weight is also discussed.  BP/Weight 04/18/2017 03/08/2017 03/01/2017 01/10/2017 01/04/2017 01/03/2017 12/29/2016  Systolic BP 108 148 141 140 130 169 128  Diastolic BP 70 87 89 90 88 99 80  Wt. (Lbs) 201.25 201 198 126.25 192.5 - 193.75  BMI 30.6 30.56 30.11 19.2 30.15 - 30.35  Some encounter information is confidential and restricted. Go to Review Flowsheets activity to see all data.       Overweight Deteriorated. Patient re-educated about  the importance of commitment to a  minimum of 150 minutes of exercise per week.  The importance of healthy food choices with portion control discussed. Encouraged to start a food diary, count calories and to consider  joining a support group. Sample diet sheets offered. Goals set by the patient for the next several months.   Weight /BMI 04/18/2017 03/08/2017 03/01/2017  WEIGHT 201 lb 4 oz 201 lb 198 lb  HEIGHT 5\' 8"  5\' 8"  5\' 8"   BMI 30.6 kg/m2 30.56 kg/m2 30.11 kg/m2  Some encounter information is confidential and restricted. Go to Review Flowsheets activity to see all data.      Depression with anxiety Managed by psych , improved slightly

## 2017-04-18 NOTE — Assessment & Plan Note (Signed)
Uncontrolled though improved increase lipitor dose and reduce fat intake. rept lab in 3.5 monh Hyperlipidemia:Low fat diet discussed and encouraged.   Lipid Panel  Lab Results  Component Value Date   CHOL 229 (H) 04/05/2017   HDL 36 (L) 04/05/2017   LDLCALC 275 (H) 12/30/2016   TRIG 148 04/05/2017   CHOLHDL 6.4 (H) 04/05/2017

## 2017-04-23 ENCOUNTER — Encounter: Payer: Self-pay | Admitting: Family Medicine

## 2017-04-25 ENCOUNTER — Encounter: Payer: Self-pay | Admitting: "Endocrinology

## 2017-04-25 ENCOUNTER — Ambulatory Visit (INDEPENDENT_AMBULATORY_CARE_PROVIDER_SITE_OTHER): Payer: Medicaid Other | Admitting: "Endocrinology

## 2017-04-25 VITALS — BP 125/83 | HR 71 | Ht 68.0 in | Wt 200.0 lb

## 2017-04-25 DIAGNOSIS — E1169 Type 2 diabetes mellitus with other specified complication: Secondary | ICD-10-CM | POA: Diagnosis not present

## 2017-04-25 DIAGNOSIS — E6609 Other obesity due to excess calories: Secondary | ICD-10-CM

## 2017-04-25 DIAGNOSIS — I10 Essential (primary) hypertension: Secondary | ICD-10-CM | POA: Diagnosis not present

## 2017-04-25 DIAGNOSIS — E669 Obesity, unspecified: Secondary | ICD-10-CM

## 2017-04-25 DIAGNOSIS — Z6830 Body mass index (BMI) 30.0-30.9, adult: Secondary | ICD-10-CM | POA: Diagnosis not present

## 2017-04-25 DIAGNOSIS — E782 Mixed hyperlipidemia: Secondary | ICD-10-CM

## 2017-04-25 NOTE — Progress Notes (Signed)
  Subjective:    Patient ID: John [name], male    DOB: 10/22/1960. Patient is being seen in f/u  for management of diabetes requested by  Simpson, Margaret E, MD  Past Medical History:  Diagnosis Date  . Anxiety   . Depression 2007  . Diabetes mellitus without complication (HCC)   . Hyperlipidemia   . Hypertension   . Hypogonadism male    Past Surgical History:  Procedure Laterality Date  . HERNIA REPAIR     ventral hernia repair    Social History   Socioeconomic History  . Marital status: Married    Spouse name: None  . Number of children: 1  . Years of education: None  . Highest education level: None  Social Needs  . Financial resource strain: None  . Food insecurity - worry: None  . Food insecurity - inability: None  . Transportation needs - medical: None  . Transportation needs - non-medical: None  Occupational History  . Occupation: yarn industry  Tobacco Use  . Smoking status: Never Smoker  . Smokeless tobacco: Never Used  Substance and Sexual Activity  . Alcohol use: Yes    Alcohol/week: 0.6 oz    Types: 1 Cans of beer per week    Comment: occasional beer socially.   . Drug use: No  . Sexual activity: Yes  Other Topics Concern  . None  Social History Narrative  . None   Outpatient Encounter Medications as of 04/25/2017  Medication Sig  . ALPRAZolam (XANAX) 1 MG tablet Take 1 tablet (1 mg total) by mouth daily as needed for anxiety.  . atorvastatin (LIPITOR) 40 MG tablet Take 1 tablet (40 mg total) by mouth daily.  . blood glucose meter kit and supplies KIT Dispense based on patient and insurance preference. Use up to four times daily as directed. (FOR ICD-9 250.00, 250.01).  . FLUoxetine (PROZAC) 20 MG capsule Take 1 capsule (20 mg total) by mouth daily.  . glucose blood (ACCU-CHEK AVIVA) test strip Use to test blood glucose 4 times a day.  . Insulin Glargine (LANTUS SOLOSTAR) 100 UNIT/ML Solostar Pen Inject 50 Units into the skin daily at 10 pm.   . lisinopril-hydrochlorothiazide (PRINZIDE,ZESTORETIC) 10-12.5 MG tablet Take 1 tablet by mouth daily.  . metFORMIN (GLUCOPHAGE) 500 MG tablet Half tablet twice daily (Patient taking differently: 500 mg at bedtime. Half tablet twice daily)  . traZODone (DESYREL) 50 MG tablet Take 50 mg by mouth at bedtime.   No facility-administered encounter medications on file as of 04/25/2017.    ALLERGIES: Allergies  Allergen Reactions  . Januvia [Sitagliptin] Nausea And Vomiting  . Metformin And Related Nausea Only  . Poison Oak Extract [Poison Oak Extract] Dermatitis   VACCINATION STATUS: Immunization History  Administered Date(s) Administered  . Influenza Whole 03/18/2010, 04/13/2011  . Influenza,inj,Quad PF,6+ Mos 05/20/2013, 05/08/2014, 06/11/2015, 04/18/2017  . Pneumococcal Polysaccharide-23 11/26/2013  . Td 09/15/2009    Diabetes  He presents for his follow-up diabetic visit. He has type 2 diabetes mellitus. Onset time: He was diagnosed at age 54. His disease course has been improving (Patient was supposed to return for a follow-up visit after his last August 2017, unfortunately he never showed up.). There are no hypoglycemic associated symptoms. Pertinent negatives for hypoglycemia include no headaches, seizures or tremors. Associated symptoms include blurred vision, polydipsia and polyuria. Pertinent negatives for diabetes include no chest pain. There are no hypoglycemic complications. Symptoms are improving. There are no diabetic complications. Risk factors for   coronary artery disease include dyslipidemia, diabetes mellitus, hypertension, male sex, obesity and sedentary lifestyle. Current diabetic treatment includes oral agent (monotherapy). His weight is stable. He is following a generally unhealthy diet. He has not had a previous visit with a dietitian. He never participates in exercise. His breakfast blood glucose range is generally 140-180 mg/dl. His lunch blood glucose range is generally  140-180 mg/dl. His dinner blood glucose range is generally 140-180 mg/dl. His bedtime blood glucose range is generally 140-180 mg/dl. His overall blood glucose range is 140-180 mg/dl. (He was supposed to present with for tests daily, unfortunately presents with only fasting blood glucose monitoring which are improving. He says that he did not have enough test strips which she did not report about. His most recent A1c was high at 14.1%.) An ACE inhibitor/angiotensin II receptor blocker is being taken. Eye exam is current.  Hyperlipidemia  This is a chronic problem. The current episode started more than 1 year ago. Pertinent negatives include no chest pain, myalgias or shortness of breath. Current antihyperlipidemic treatment includes statins. Risk factors for coronary artery disease include diabetes mellitus, hypertension, male sex, obesity and a sedentary lifestyle.  Hypertension  This is a chronic problem. The current episode started more than 1 year ago. The problem is controlled. Associated symptoms include blurred vision. Pertinent negatives include no chest pain, headaches, palpitations or shortness of breath. Risk factors for coronary artery disease include dyslipidemia, diabetes mellitus, male gender, obesity and sedentary lifestyle.    Review of Systems  Constitutional: Negative for chills and fever.  Eyes: Positive for blurred vision and visual disturbance.  Respiratory: Negative for cough and shortness of breath.   Cardiovascular: Negative for chest pain and palpitations.       No Shortness of breath  Gastrointestinal: Negative for abdominal pain, diarrhea, nausea and vomiting.  Endocrine: Positive for polydipsia and polyuria.  Genitourinary: Negative for frequency, hematuria and urgency.  Musculoskeletal: Negative for myalgias.  Skin: Negative for rash.  Neurological: Negative for tremors, seizures and headaches.  Hematological: Does not bruise/bleed easily.  Psychiatric/Behavioral:  Negative for hallucinations and suicidal ideas.    Objective:    BP 125/83 (BP Location: Right Arm, Patient Position: Sitting, Cuff Size: Normal)   Pulse 71   Ht 5' 8" (1.727 m)   Wt 200 lb (90.7 kg)   BMI 30.41 kg/m   Wt Readings from Last 3 Encounters:  04/25/17 200 lb (90.7 kg)  04/18/17 201 lb 4 oz (91.3 kg)  03/08/17 201 lb (91.2 kg)    Physical Exam  Constitutional: He is oriented to person, place, and time. He appears well-developed and well-nourished. He is cooperative. No distress.  HENT:  Head: Normocephalic and atraumatic.  Eyes: EOM are normal.  Neck: Normal range of motion. Neck supple. No tracheal deviation present. No thyromegaly present.  Cardiovascular: Normal rate, S1 normal, S2 normal and normal heart sounds. Exam reveals no gallop.  No murmur heard. Pulses:      Dorsalis pedis pulses are 1+ on the right side, and 1+ on the left side.       Posterior tibial pulses are 1+ on the right side, and 1+ on the left side.  Pulmonary/Chest: Breath sounds normal. No respiratory distress. He has no wheezes.  Abdominal: Soft. Bowel sounds are normal. He exhibits no distension. There is no tenderness. There is no guarding and no CVA tenderness.  Musculoskeletal: He exhibits no edema.       Right shoulder: He exhibits no swelling and no   deformity.  Neurological: He is alert and oriented to person, place, and time. He has normal strength and normal reflexes. No cranial nerve deficit or sensory deficit. Gait normal.  Skin: Skin is warm and dry. No rash noted. No cyanosis. Nails show no clubbing.  Psychiatric: He has a normal mood and affect. His speech is normal and behavior is normal. Judgment and thought content normal. Cognition and memory are normal.     CMP ( most recent) CMP     Component Value Date/Time   NA 140 04/05/2017 0840   K 3.8 04/05/2017 0840   CL 103 04/05/2017 0840   CO2 30 04/05/2017 0840   GLUCOSE 108 (H) 04/05/2017 0840   BUN 11 04/05/2017 0840    CREATININE 1.14 04/05/2017 0840   CALCIUM 9.5 04/05/2017 0840   PROT 7.0 04/05/2017 0840   ALBUMIN 4.1 12/30/2016 0950   AST 24 04/05/2017 0840   ALT 55 (H) 04/05/2017 0840   ALKPHOS 95 12/30/2016 0950   BILITOT 0.6 04/05/2017 0840   GFRNONAA 71 04/05/2017 0840   GFRAA 82 04/05/2017 0840    Diabetic Labs (most recent): Lab Results  Component Value Date   HGBA1C 9.1 (H) 04/05/2017   HGBA1C 14.1 (H) 12/30/2016   HGBA1C >14% 12/28/2016     Lipid Panel ( most recent) Lipid Panel     Component Value Date/Time   CHOL 229 (H) 04/05/2017 0840   TRIG 148 04/05/2017 0840   HDL 36 (L) 04/05/2017 0840   CHOLHDL 6.4 (H) 04/05/2017 0840   VLDL 64 (H) 12/30/2016 0950   LDLCALC 275 (H) 12/30/2016 0950     Assessment & Plan:   1. Diabetes mellitus type 2 in obese (HCC)  - Patient has currently uncontrolled symptomatic type 2 DM since  56 years of age. - Mr. Heathcock returns with significant improvement in his glycemic profile. A1c is still high at 9.1% however improving from 14.1%. - Over the last 30 days is average blood glucose is 151.   He is diabetes is complicated by obesity , noncompliance/nonadherence, and patient remains at a high risk for more acute and chronic complications of diabetes which include CAD, CVA, CKD, retinopathy, and neuropathy. These are all discussed in detail with the patient.  - I have counseled the patient on diet management and weight loss, by adopting a carbohydrate restricted/protein rich diet.  -  Suggestion is made for him to avoid simple carbohydrates  from his diet including Cakes, Sweet Desserts / Pastries, Ice Cream, Soda (diet and regular), Sweet Tea, Candies, Chips, Cookies, Store Bought Juices, Alcohol in Excess of  1-2 drinks a day, Artificial Sweeteners, and "Sugar-free" Products. This will help patient to have stable blood glucose profile and potentially avoid unintended weight gain.   - I encouraged the patient to switch to  unprocessed or  minimally processed complex starch and increased protein intake (animal or plant source), fruits, and vegetables.  - Patient is advised to stick to a routine mealtimes to eat 3 meals  a day and avoid unnecessary snacks ( to snack only to correct hypoglycemia).   - I have approached patient with the following individualized plan to manage diabetes and patient agrees:   -  He has responded adequately to basal insulin. I advised him to continue Lantus 50 units at bedtime associated with strict monitoring of blood glucose 2 times a day-daily before breakfast and at bedtime.  - Based on his current glycemic profile, he would not require prandial insulin for now.  -  Patient is encouraged to call clinic for blood glucose levels less than 70 or above 300 mg /dl.  - I advised him to continue metformin  500 mg by mouth twice a day.   - Patient will be considered for incretin therapy as appropriate next visit. - Patient specific target  A1c;  LDL, HDL, Triglycerides, and  Waist Circumference were discussed in detail.  2) BP/HTN: uncontrolled. Continue current medications , including lisinopril/HCTZ 10/12.5 mg po daily. 3) Lipids/HPL:  uncontrolled,  LDL  improving to 164 from  275, and triglycerides improving to 140 from 319. I  have advised him to be consistent in taking his simvastatin 40 mg by mouth daily at bedtime. Side effects and precautions discussed with him.   4)  Weight/Diet: CDE Consult has been  initiated , exercise, and detailed carbohydrates information provided.  5) Chronic Care/Health Maintenance:  -Patient is on ACEI/ARB and Statin medications and encouraged to continue to follow up with Ophthalmology, Podiatrist at least yearly or according to recommendations, and advised to   stay away from smoking. I have recommended yearly flu vaccine and pneumonia vaccination at least every 5 years; moderate intensity exercise for up to 150 minutes weekly; and  sleep for at least 7 hours a  day.  - I advised patient to maintain close follow up with Simpson, Margaret E, MD for primary care needs.  Follow up plan: - Return in about 3 months (around 07/26/2017) for meter, and logs, follow up with pre-visit labs, meter, and logs.  - Time spent with the patient: 25 min, of which >50% was spent in reviewing his sugar logs , discussing his hypo- and hyper-glycemic episodes, reviewing his current and  previous labs and insulin doses and developing a plan to avoid hypo- and hyper-glycemia.   Gebre , MD Phone: [phone]  Fax: [phone]   This note was partially dictated with voice recognition software. Similar sounding words can be transcribed inadequately or may not  be corrected upon review.  04/25/2017, 11:55 AM 

## 2017-04-25 NOTE — Patient Instructions (Signed)

## 2017-05-07 ENCOUNTER — Other Ambulatory Visit: Payer: Self-pay | Admitting: Physician Assistant

## 2017-06-25 ENCOUNTER — Other Ambulatory Visit (HOSPITAL_COMMUNITY): Payer: Self-pay | Admitting: Psychiatry

## 2017-06-26 ENCOUNTER — Other Ambulatory Visit: Payer: Self-pay | Admitting: "Endocrinology

## 2017-06-27 ENCOUNTER — Other Ambulatory Visit (HOSPITAL_COMMUNITY): Payer: Self-pay | Admitting: Psychiatry

## 2017-06-30 ENCOUNTER — Other Ambulatory Visit: Payer: Self-pay | Admitting: Family Medicine

## 2017-06-30 ENCOUNTER — Encounter (HOSPITAL_COMMUNITY): Payer: Self-pay | Admitting: Psychiatry

## 2017-06-30 ENCOUNTER — Telehealth: Payer: Self-pay | Admitting: Family Medicine

## 2017-06-30 ENCOUNTER — Ambulatory Visit (HOSPITAL_COMMUNITY): Payer: Medicaid Other | Admitting: Psychiatry

## 2017-06-30 VITALS — BP 164/94 | HR 79 | Ht 68.0 in | Wt 206.0 lb

## 2017-06-30 DIAGNOSIS — F322 Major depressive disorder, single episode, severe without psychotic features: Secondary | ICD-10-CM

## 2017-06-30 DIAGNOSIS — F1099 Alcohol use, unspecified with unspecified alcohol-induced disorder: Secondary | ICD-10-CM

## 2017-06-30 DIAGNOSIS — Z634 Disappearance and death of family member: Secondary | ICD-10-CM

## 2017-06-30 DIAGNOSIS — G4700 Insomnia, unspecified: Secondary | ICD-10-CM

## 2017-06-30 DIAGNOSIS — N489 Disorder of penis, unspecified: Secondary | ICD-10-CM

## 2017-06-30 DIAGNOSIS — Z560 Unemployment, unspecified: Secondary | ICD-10-CM | POA: Diagnosis not present

## 2017-06-30 MED ORDER — ALPRAZOLAM 1 MG PO TABS: 1.0000 mg | ORAL_TABLET | Freq: Every day | ORAL | 2 refills | Status: DC | PRN

## 2017-06-30 MED ORDER — FLUOXETINE HCL 20 MG PO CAPS: 20.0000 mg | ORAL_CAPSULE | Freq: Every day | ORAL | 2 refills | Status: DC

## 2017-06-30 MED ORDER — TEMAZEPAM 30 MG PO CAPS: 30.0000 mg | ORAL_CAPSULE | Freq: Every evening | ORAL | 2 refills | Status: DC | PRN

## 2017-06-30 NOTE — Progress Notes (Signed)
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

## 2017-06-30 NOTE — Telephone Encounter (Signed)
Cb# [phone]   Patient needs a referral to Dr.Dahlstedt for circumcision, he is already scheduled 08/08/17.
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
## 2017-06-30 NOTE — Telephone Encounter (Signed)
Referral is entered 

## 2017-07-17 ENCOUNTER — Ambulatory Visit (INDEPENDENT_AMBULATORY_CARE_PROVIDER_SITE_OTHER): Payer: Medicare Other | Admitting: Licensed Clinical Social Worker

## 2017-07-17 ENCOUNTER — Encounter (HOSPITAL_COMMUNITY): Payer: Self-pay | Admitting: Licensed Clinical Social Worker

## 2017-07-17 DIAGNOSIS — F322 Major depressive disorder, single episode, severe without psychotic features: Secondary | ICD-10-CM | POA: Diagnosis not present
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
## 2017-07-19 ENCOUNTER — Encounter: Payer: Self-pay | Admitting: Family Medicine

## 2017-07-19 ENCOUNTER — Ambulatory Visit (INDEPENDENT_AMBULATORY_CARE_PROVIDER_SITE_OTHER): Payer: Medicare Other | Admitting: Family Medicine

## 2017-07-19 VITALS — BP 130/70 | HR 91 | Temp 98.4°F | Ht 68.0 in | Wt 204.0 lb

## 2017-07-19 DIAGNOSIS — I10 Essential (primary) hypertension: Secondary | ICD-10-CM

## 2017-07-19 DIAGNOSIS — E1169 Type 2 diabetes mellitus with other specified complication: Secondary | ICD-10-CM

## 2017-07-19 DIAGNOSIS — E663 Overweight: Secondary | ICD-10-CM

## 2017-07-19 DIAGNOSIS — E669 Obesity, unspecified: Secondary | ICD-10-CM | POA: Diagnosis not present

## 2017-07-19 DIAGNOSIS — Z23 Encounter for immunization: Secondary | ICD-10-CM | POA: Diagnosis not present

## 2017-07-19 DIAGNOSIS — E782 Mixed hyperlipidemia: Secondary | ICD-10-CM | POA: Diagnosis not present

## 2017-07-19 DIAGNOSIS — Z125 Encounter for screening for malignant neoplasm of prostate: Secondary | ICD-10-CM | POA: Diagnosis not present

## 2017-07-19 DIAGNOSIS — F418 Other specified anxiety disorders: Secondary | ICD-10-CM

## 2017-07-19 LAB — COMPLETE METABOLIC PANEL WITH GFR
AG Ratio: 1.3 (calc) (ref 1.0–2.5)
ALBUMIN MSPROF: 4.3 g/dL (ref 3.6–5.1)
ALKALINE PHOSPHATASE (APISO): 107 U/L (ref 40–115)
ALT: 33 U/L (ref 9–46)
AST: 23 U/L (ref 10–35)
BILIRUBIN TOTAL: 0.8 mg/dL (ref 0.2–1.2)
BUN: 12 mg/dL (ref 7–25)
CHLORIDE: 97 mmol/L — AB (ref 98–110)
CO2: 29 mmol/L (ref 20–32)
Calcium: 9.9 mg/dL (ref 8.6–10.3)
Creat: 1.23 mg/dL (ref 0.70–1.33)
GFR, Est African American: 75 mL/min/{1.73_m2} (ref >=60)
GFR, Est Non African American: 65 mL/min/{1.73_m2} (ref >=60)
Globulin: 3.4 g/dL (calc) (ref 1.9–3.7)
Glucose, Bld: 221 mg/dL — ABNORMAL HIGH (ref 65–139)
Potassium: 4.1 mmol/L (ref 3.5–5.3)
Sodium: 134 mmol/L — ABNORMAL LOW (ref 135–146)
Total Protein: 7.7 g/dL (ref 6.1–8.1)

## 2017-07-19 LAB — LIPID PANEL
CHOL/HDL RATIO: 4.7 (calc) (ref <5.0)
Cholesterol: 194 mg/dL (ref <200)
HDL: 41 mg/dL (ref >40)
LDL CHOLESTEROL (CALC): 127 mg/dL — AB
NON-HDL CHOLESTEROL (CALC): 153 mg/dL — AB (ref <130)
TRIGLYCERIDES: 144 mg/dL (ref <150)

## 2017-07-19 LAB — PSA: PSA: 0.8 ng/mL (ref <=4.0)

## 2017-07-19 NOTE — Progress Notes (Signed)
John [name]     MRN: [id number]      DOB: January 04, 1961   HPI John [name] is here for follow up and re-evaluation of chronic medical conditions, medication management and review of any available recent lab and radiology data.  Preventive health is updated, specifically  Cancer screening and Immunization.   Questions or concerns regarding consultations or procedures which the PT has had in the interim are  addressed. The PT denies any adverse reactions to current medications since the last visit.  There are no new concerns.  There are no specific complaints   ROS Denies recent fever or chills. Denies sinus pressure, nasal congestion, ear pain or sore throat. Denies chest congestion, productive cough or wheezing. Denies chest pains, palpitations and leg swelling Denies abdominal pain, nausea, vomiting,diarrhea or constipation.   Denies dysuria, frequency, hesitancy or incontinence. Denies joint pain, swelling and limitation in mobility. Denies headaches, seizures, numbness, or tingling. Denies depression, anxiety or insomnia. Denies skin break down or rash.   PE  BP 130/70   Pulse 91   Temp 98.4 F (36.9 C)   Ht 5\' 8"  (1.727 m)   Wt 204 lb 0.6 oz (92.6 kg)   SpO2 98%   BMI 31.02 kg/m   Patient alert and oriented and in no cardiopulmonary distress.  HEENT: No facial asymmetry, EOMI,   oropharynx pink and moist.  Neck supple no JVD, no mass.  Chest: Clear to auscultation bilaterally.  CVS: S1, S2 no murmurs, no S3.Regular rate.  ABD: Soft non tender.   Ext: No edema  MS: Adequate ROM spine, shoulders, hips and knees.  Skin: Intact, no ulcerations or rash noted.  Psych: Good eye contact, normal affect. Memory intact not anxious or depressed appearing.  CNS: CN 2-12 intact, power,  normal throughout.no focal deficits noted.   Assessment & Plan  Essential hypertension Controlled, no change in medication DASH diet and commitment to daily physical activity for  a minimum of 30 minutes discussed and encouraged, as a part of hypertension management. The importance of attaining a healthy weight is also discussed.  BP/Weight 07/19/2017 04/25/2017 04/18/2017 03/08/2017 03/01/2017 01/10/2017 01/04/2017  Systolic BP 130 125 108 148 141 140 130  Diastolic BP 70 83 70 87 89 90 88  Wt. (Lbs) 204.04 200 201.25 201 198 126.25 192.5  BMI 31.02 30.41 30.6 30.56 30.11 19.2 30.15  Some encounter information is confidential and restricted. Go to Review Flowsheets activity to see all data.       Diabetes mellitus type 2 in obese [facility]) John [name] is reminded of the importance of commitment to daily physical activity for 30 minutes or more, as able and the need to limit carbohydrate intake to 30 to 60 grams per meal to help with blood sugar control.   The need to take medication as prescribed, test blood sugar as directed, and to call between visits if there is a concern that blood sugar is uncontrolled is also discussed.   John [name] is reminded of the importance of daily foot exam, annual eye examination, and good blood sugar, blood pressure and cholesterol control. Uncontrolled but improved, managed by endocrinology  Diabetic Labs Latest Ref Rng & Units 07/19/2017 04/05/2017 12/30/2016 12/28/2016 09/18/2016  HbA1c <5.7 % of total Hgb - 9.1(H) 14.1(H) >14% -  Microalbumin Not Estab. ug/mL - - 134.8(H) - -  Micro/Creat Ratio <30 mcg/mg creat - - - - -  Chol <200 mg/dL 782 956(O) 130(Q) - -  HDL >40 mg/dL  41 36(L) 39(L) - -  Calc LDL 0 - 99 mg/dL - - 132(G275(H) - -  Triglycerides <150 mg/dL 401144 027148 253(G319(H) - -  Creatinine 0.70 - 1.33 mg/dL 6.441.23 0.341.14 - 7.421.22 5.95(G1.28(H)   BP/Weight 07/19/2017 04/25/2017 04/18/2017 03/08/2017 03/01/2017 01/10/2017 01/04/2017  Systolic BP 130 125 108 148 141 140 130  Diastolic BP 70 83 70 87 89 90 88  Wt. (Lbs) 204.04 200 201.25 201 198 126.25 192.5  BMI 31.02 30.41 30.6 30.56 30.11 19.2 30.15  Some encounter information is confidential and  restricted. Go to Review Flowsheets activity to see all data.   Foot/eye exam completion dates 04/18/2017 01/26/2016  Foot Form Completion Done Done        Mixed hyperlipidemia Hyperlipidemia:Low fat diet discussed and encouraged.   Lipid Panel  Lab Results  Component Value Date   CHOL 194 07/19/2017   HDL 41 07/19/2017   LDLCALC 275 (H) 12/30/2016   TRIG 144 07/19/2017   CHOLHDL 4.7 07/19/2017   uncontrolled   Overweight Deteriorated. Patient re-educated about  the importance of commitment to a  minimum of 150 minutes of exercise per week.  The importance of healthy food choices with portion control discussed. Encouraged to start a food diary, count calories and to consider  joining a support group. Sample diet sheets offered. Goals set by the patient for the next several months.   Weight /BMI 07/19/2017 04/25/2017 04/18/2017  WEIGHT 204 lb 0.6 oz 200 lb 201 lb 4 oz  HEIGHT 5\' 8"  5\' 8"  5\' 8"   BMI 31.02 kg/m2 30.41 kg/m2 30.6 kg/m2  Some encounter information is confidential and restricted. Go to Review Flowsheets activity to see all data.      Depression with anxiety Improved and well controlled, managed by psychiatry

## 2017-07-19 NOTE — Patient Instructions (Addendum)
Welcome to medicare with mD early June, call if you need me sooner  Fasting lipid, cmp and EGFr and PSA today  Thankful that you are doing better, contimnue to keep al;lmedical appointments  Let Dr Dorris Fetch know that you do not take metformin  Avoid sweets and fatty foods   It is important that you exercise regularly at least 30 minutes 5 times a week. If you develop chest pain, have severe difficulty breathing, or feel very tired, stop exercising immediately and seek medical attention \  Thank you  for choosing [facility]. We consider it a privelige to serve you.  Delivering excellent health care in a caring and  compassionate way is our goal.  Partnering with you,  so that together we can achieve this goal is our strategy.

## 2017-07-23 ENCOUNTER — Encounter: Payer: Self-pay | Admitting: Family Medicine

## 2017-07-23 NOTE — Assessment & Plan Note (Signed)
John [name] is reminded of the importance of commitment to daily physical activity for 30 minutes or more, as able and the need to limit carbohydrate intake to 30 to 60 grams per meal to help with blood sugar control.   The need to take medication as prescribed, test blood sugar as directed, and to call between visits if there is a concern that blood sugar is uncontrolled is also discussed.   John [name] is reminded of the importance of daily foot exam, annual eye examination, and good blood sugar, blood pressure and cholesterol control. Uncontrolled but improved, managed by endocrinology  Diabetic Labs Latest Ref Rng & Units 07/19/2017 04/05/2017 12/30/2016 12/28/2016 09/18/2016  HbA1c <5.7 % of total Hgb - 9.1(H) 14.1(H) >14% -  Microalbumin Not Estab. ug/mL - - 134.8(H) - -  Micro/Creat Ratio <30 mcg/mg creat - - - - -  Chol <200 mg/dL 161194 096(E229(H) 454(U378(H) - -  HDL >40 mg/dL 41 98(J36(L) 19(J39(L) - -  Calc LDL 0 - 99 mg/dL - - 478(G275(H) - -  Triglycerides <150 mg/dL 956144 213148 086(V319(H) - -  Creatinine 0.70 - 1.33 mg/dL 7.841.23 6.961.14 - 2.951.22 2.84(X1.28(H)   BP/Weight 07/19/2017 04/25/2017 04/18/2017 03/08/2017 03/01/2017 01/10/2017 01/04/2017  Systolic BP 130 125 108 148 141 140 130  Diastolic BP 70 83 70 87 89 90 88  Wt. (Lbs) 204.04 200 201.25 201 198 126.25 192.5  BMI 31.02 30.41 30.6 30.56 30.11 19.2 30.15  Some encounter information is confidential and restricted. Go to Review Flowsheets activity to see all data.   Foot/eye exam completion dates 04/18/2017 01/26/2016  Foot Form Completion Done Done

## 2017-07-23 NOTE — Assessment & Plan Note (Signed)
Improved and well controlled, managed by psychiatry

## 2017-07-23 NOTE — Assessment & Plan Note (Signed)
Deteriorated. Patient re-educated about  the importance of commitment to a  minimum of 150 minutes of exercise per week.  The importance of healthy food choices with portion control discussed. Encouraged to start a food diary, count calories and to consider  joining a support group. Sample diet sheets offered. Goals set by the patient for the next several months.   Weight /BMI 07/19/2017 04/25/2017 04/18/2017  WEIGHT 204 lb 0.6 oz 200 lb 201 lb 4 oz  HEIGHT 5\' 8"  5\' 8"  5\' 8"   BMI 31.02 kg/m2 30.41 kg/m2 30.6 kg/m2  Some encounter information is confidential and restricted. Go to Review Flowsheets activity to see all data.

## 2017-07-23 NOTE — Assessment & Plan Note (Signed)
Controlled, no change in medication DASH diet and commitment to daily physical activity for a minimum of 30 minutes discussed and encouraged, as a part of hypertension management. The importance of attaining a healthy weight is also discussed.  BP/Weight 07/19/2017 04/25/2017 04/18/2017 03/08/2017 03/01/2017 01/10/2017 01/04/2017  Systolic BP 130 125 108 148 141 140 130  Diastolic BP 70 83 70 87 89 90 88  Wt. (Lbs) 204.04 200 201.25 201 198 126.25 192.5  BMI 31.02 30.41 30.6 30.56 30.11 19.2 30.15  Some encounter information is confidential and restricted. Go to Review Flowsheets activity to see all data.

## 2017-07-23 NOTE — Assessment & Plan Note (Signed)
Hyperlipidemia:Low fat diet discussed and encouraged.   Lipid Panel  Lab Results  Component Value Date   CHOL 194 07/19/2017   HDL 41 07/19/2017   LDLCALC 275 (H) 12/30/2016   TRIG 144 07/19/2017   CHOLHDL 4.7 07/19/2017   uncontrolled

## 2017-07-26 ENCOUNTER — Encounter: Payer: Self-pay | Admitting: "Endocrinology

## 2017-07-26 ENCOUNTER — Ambulatory Visit (INDEPENDENT_AMBULATORY_CARE_PROVIDER_SITE_OTHER): Payer: Medicare Other | Admitting: "Endocrinology

## 2017-07-26 VITALS — BP 134/70 | HR 64 | Ht 68.0 in | Wt 206.0 lb

## 2017-07-26 DIAGNOSIS — I10 Essential (primary) hypertension: Secondary | ICD-10-CM | POA: Diagnosis not present

## 2017-07-26 DIAGNOSIS — E669 Obesity, unspecified: Secondary | ICD-10-CM | POA: Diagnosis not present

## 2017-07-26 DIAGNOSIS — Z6830 Body mass index (BMI) 30.0-30.9, adult: Secondary | ICD-10-CM | POA: Diagnosis not present

## 2017-07-26 DIAGNOSIS — E6609 Other obesity due to excess calories: Secondary | ICD-10-CM

## 2017-07-26 DIAGNOSIS — E1169 Type 2 diabetes mellitus with other specified complication: Secondary | ICD-10-CM | POA: Diagnosis not present

## 2017-07-26 DIAGNOSIS — E782 Mixed hyperlipidemia: Secondary | ICD-10-CM | POA: Diagnosis not present

## 2017-07-26 NOTE — Patient Instructions (Signed)

## 2017-07-26 NOTE — Progress Notes (Signed)
Subjective:    Patient ID: John [name], male    DOB: 1960/10/03. Patient is being seen in f/u  for management of diabetes requested by  Fayrene Helper, MD  Past Medical History:  Diagnosis Date  . Anxiety   . Depression 2007  . Diabetes mellitus without complication ([place])   . Hyperlipidemia   . Hypertension   . Hypogonadism male    Past Surgical History:  Procedure Laterality Date  . COLONOSCOPY  01/20/2011   Procedure: COLONOSCOPY;  Surgeon: Dorothyann Peng, MD;  Location: [facility];  Service: Endoscopy;  Laterality: N/A;  . HERNIA REPAIR     ventral hernia repair    Social History   Socioeconomic History  . Marital status: Married    Spouse name: None  . Number of children: 1  . Years of education: None  . Highest education level: None  Social Needs  . Financial resource strain: None  . Food insecurity - worry: None  . Food insecurity - inability: None  . Transportation needs - medical: None  . Transportation needs - non-medical: None  Occupational History  . Occupation: Production assistant, radio  Tobacco Use  . Smoking status: Never Smoker  . Smokeless tobacco: Never Used  Substance and Sexual Activity  . Alcohol use: Yes    Alcohol/week: 0.6 oz    Types: 1 Cans of beer per week    Comment: occasional beer socially.   . Drug use: No  . Sexual activity: Yes  Other Topics Concern  . None  Social History Narrative  . None   Outpatient Encounter Medications as of 07/26/2017  Medication Sig  . metFORMIN (GLUCOPHAGE) 500 MG tablet Take 500 mg by mouth daily with breakfast.  . ALPRAZolam (XANAX) 1 MG tablet Take 1 tablet (1 mg total) by mouth daily as needed for anxiety.  [name] atorvastatin (LIPITOR) 40 MG tablet Take 1 tablet (40 mg total) by mouth daily.  . blood glucose meter kit and supplies KIT Dispense based on patient and insurance preference. Use up to four times daily as directed. (FOR ICD-9 250.00, 250.01).  [name] FLUoxetine (PROZAC) 20 MG capsule Take 1  capsule (20 mg total) by mouth daily.  [name] glucose blood (ACCU-CHEK AVIVA) test strip Use to test blood glucose 4 times a day.  . Insulin Glargine (LANTUS SOLOSTAR) 100 UNIT/ML Solostar Pen Inject 50 Units into the skin at bedtime.  [name] lisinopril-hydrochlorothiazide (PRINZIDE,ZESTORETIC) 10-12.5 MG tablet Take 1 tablet by mouth daily.  . temazepam (RESTORIL) 30 MG capsule Take 1 capsule (30 mg total) by mouth at bedtime as needed for sleep.   No facility-administered encounter medications on file as of 07/26/2017.    ALLERGIES: Allergies  Allergen Reactions  . Januvia [Sitagliptin] Nausea And Vomiting  . Metformin And Related Nausea Only  . Poison Oak Extract [Poison Oak Extract] Dermatitis   VACCINATION STATUS: Immunization History  Administered Date(s) Administered  . Influenza Whole 03/18/2010, 04/13/2011  . Influenza,inj,Quad PF,6+ Mos 05/20/2013, 05/08/2014, 06/11/2015, 04/18/2017  . Pneumococcal Polysaccharide-23 11/26/2013  . Td 09/15/2009    Diabetes  He presents for his follow-up diabetic visit. He has type 2 diabetes mellitus. Onset time: He was diagnosed at age 67. His disease course has been improving (Patient was supposed to return for a follow-up visit after his last August 2017, unfortunately he never showed up.). There are no hypoglycemic associated symptoms. Pertinent negatives for hypoglycemia include no headaches, seizures or tremors. Associated symptoms include blurred vision, polydipsia and polyuria.  Pertinent negatives for diabetes include no chest pain. There are no hypoglycemic complications. Symptoms are improving. There are no diabetic complications. Risk factors for coronary artery disease include dyslipidemia, diabetes mellitus, hypertension, male sex, obesity and sedentary lifestyle. Current diabetic treatment includes oral agent (monotherapy). His weight is increasing steadily. He is following a generally unhealthy diet. He has not had a previous visit with a  dietitian. He never participates in exercise. His breakfast blood glucose range is generally 110-130 mg/dl. His bedtime blood glucose range is generally 180-200 mg/dl. His overall blood glucose range is 180-200 mg/dl. (He was supposed to present with for tests daily, unfortunately presents with only fasting blood glucose monitoring which are improving. He says that he did not have enough test strips which she did not report about. His most recent A1c was high at 14.1%.) An ACE inhibitor/angiotensin II receptor blocker is being taken. Eye exam is current.  Hyperlipidemia  This is a chronic problem. The current episode started more than 1 year ago. The problem is uncontrolled. Exacerbating diseases include diabetes and obesity. Pertinent negatives include no chest pain, myalgias or shortness of breath. Current antihyperlipidemic treatment includes statins. Risk factors for coronary artery disease include diabetes mellitus, hypertension, male sex, obesity, a sedentary lifestyle and dyslipidemia.  Hypertension  This is a chronic problem. The current episode started more than 1 year ago. The problem is controlled. Associated symptoms include blurred vision. Pertinent negatives include no chest pain, headaches, palpitations or shortness of breath. Risk factors for coronary artery disease include dyslipidemia, diabetes mellitus, male gender, obesity and sedentary lifestyle. Past treatments include ACE inhibitors and diuretics.    Review of Systems  Constitutional: Negative for chills and fever.  Eyes: Positive for blurred vision and visual disturbance.  Respiratory: Negative for cough and shortness of breath.   Cardiovascular: Negative for chest pain and palpitations.       No Shortness of breath  Gastrointestinal: Negative for abdominal pain, diarrhea, nausea and vomiting.  Endocrine: Positive for polydipsia and polyuria.  Genitourinary: Negative for frequency, hematuria and urgency.  Musculoskeletal:  Negative for myalgias.  Skin: Negative for rash.  Neurological: Negative for tremors, seizures and headaches.  Hematological: Does not bruise/bleed easily.  Psychiatric/Behavioral: Negative for hallucinations and suicidal ideas.    Objective:    BP 134/70   Pulse 64   Ht '5\' 8"'  (1.727 m)   Wt 206 lb (93.4 kg)   BMI 31.32 kg/m   Wt Readings from Last 3 Encounters:  07/26/17 206 lb (93.4 kg)  07/19/17 204 lb 0.6 oz (92.6 kg)  04/25/17 200 lb (90.7 kg)    Physical Exam  Constitutional: He is oriented to person, place, and time. He appears well-developed and well-nourished. He is cooperative. No distress.  HENT:  Head: Normocephalic and atraumatic.  Eyes: EOM are normal.  Neck: Normal range of motion. Neck supple. No tracheal deviation present. No thyromegaly present.  Cardiovascular: Normal rate, S1 normal, S2 normal and normal heart sounds. Exam reveals no gallop.  No murmur heard. Pulses:      Dorsalis pedis pulses are 1+ on the right side, and 1+ on the left side.       Posterior tibial pulses are 1+ on the right side, and 1+ on the left side.  Pulmonary/Chest: Breath sounds normal. No respiratory distress. He has no wheezes.  Abdominal: Soft. Bowel sounds are normal. He exhibits no distension. There is no tenderness. There is no guarding and no CVA tenderness.  Musculoskeletal: He exhibits no  edema.       Right shoulder: He exhibits no swelling and no deformity.  Neurological: He is alert and oriented to person, place, and time. He has normal strength and normal reflexes. No cranial nerve deficit or sensory deficit. Gait normal.  Skin: Skin is warm and dry. No rash noted. No cyanosis. Nails show no clubbing.  Psychiatric: He has a normal mood and affect. His speech is normal and behavior is normal. Judgment and thought content normal. Cognition and memory are normal.     CMP ( most recent) CMP     Component Value Date/Time   NA 134 (L) 07/19/2017 1014   K 4.1 07/19/2017  1014   CL 97 (L) 07/19/2017 1014   CO2 29 07/19/2017 1014   GLUCOSE 221 (H) 07/19/2017 1014   BUN 12 07/19/2017 1014   CREATININE 1.23 07/19/2017 1014   CALCIUM 9.9 07/19/2017 1014   PROT 7.7 07/19/2017 1014   ALBUMIN 4.1 12/30/2016 0950   AST 23 07/19/2017 1014   ALT 33 07/19/2017 1014   ALKPHOS 95 12/30/2016 0950   BILITOT 0.8 07/19/2017 1014   GFRNONAA 65 07/19/2017 1014   GFRAA 75 07/19/2017 1014    Diabetic Labs (most recent): Lab Results  Component Value Date   HGBA1C 9.1 (H) 04/05/2017   HGBA1C 14.1 (H) 12/30/2016   HGBA1C >14% 12/28/2016     Lipid Panel ( most recent) Lipid Panel     Component Value Date/Time   CHOL 194 07/19/2017 1014   TRIG 144 07/19/2017 1014   HDL 41 07/19/2017 1014   CHOLHDL 4.7 07/19/2017 1014   VLDL 64 (H) 12/30/2016 0950   LDLCALC 275 (H) 12/30/2016 0950     Assessment & Plan:   1. Diabetes mellitus type 2 in obese [facility])  - Patient has currently uncontrolled symptomatic type 2 DM since  57 years of age. - John [name] returns with significant improvement in his glycemic profile.  -His recent labs did not include A1c, his last visit A1c was 9.1% improving from 14.1%.     He is diabetes is complicated by obesity , noncompliance/nonadherence, and patient remains at a high risk for more acute and chronic complications of diabetes which include CAD, CVA, CKD, retinopathy, and neuropathy. These are all discussed in detail with the patient.  - I have counseled the patient on diet management and weight loss, by adopting a carbohydrate restricted/protein rich diet.  -  Suggestion is made for him to avoid simple carbohydrates  from his diet including Cakes, Sweet Desserts / Pastries, Ice Cream, Soda (diet and regular), Sweet Tea, Candies, Chips, Cookies, Store Bought Juices, Alcohol in Excess of  1-2 drinks a day, Artificial Sweeteners, and "Sugar-free" Products. This will help patient to have stable blood glucose profile and potentially avoid  unintended weight gain.   - I encouraged the patient to switch to  unprocessed or minimally processed complex starch and increased protein intake (animal or plant source), fruits, and vegetables.  - Patient is advised to stick to a routine mealtimes to eat 3 meals  a day and avoid unnecessary snacks ( to snack only to correct hypoglycemia).   - I have approached patient with the following individualized plan to manage diabetes and patient agrees:   -  He has responded adequately to basal insulin. I advised him to continue Lantus 50 units at bedtime associated with strict monitoring of blood glucose 2 times a day-daily before breakfast and at bedtime.  - Based on his current  glycemic profile, he would not require prandial insulin for now.  -Patient is encouraged to call clinic for blood glucose levels less than 70 or above 300 mg /dl.  - I advised him to continue metformin  500 mg by mouth once a day after breakfast.  - Patient will be considered for incretin therapy as appropriate next visit. - Patient specific target  A1c;  LDL, HDL, Triglycerides, and  Waist Circumference were discussed in detail.  2) BP/HTN: uncontrolled. Continue current medications , including lisinopril/HCTZ 10/12.5 mg po daily. 3) Lipids/HPL:  uncontrolled,  LDL  improving to 164 from  275, and triglycerides improving to 140 from 319. I  have advised him to be consistent in taking his simvastatin 40 mg by mouth daily at bedtime. Side effects and precautions discussed with him.   4)  Weight/Diet: CDE Consult has been  initiated , exercise, and detailed carbohydrates information provided.  5) Chronic Care/Health Maintenance:  -Patient is on ACEI/ARB and Statin medications and encouraged to continue to follow up with Ophthalmology, Podiatrist at least yearly or according to recommendations, and advised to   stay away from smoking. I have recommended yearly flu vaccine and pneumonia vaccination at least every 5 years;  moderate intensity exercise for up to 150 minutes weekly; and  sleep for at least 7 hours a day.  - I advised patient to maintain close follow up with Fayrene Helper, MD for primary care needs.  Follow up plan: - Return in about 8 weeks (around 09/20/2017) for follow up with pre-visit labs, meter, and logs.  - Time spent with the patient: 25 min, of which >50% was spent in reviewing his blood glucose logs , discussing his hypo- and hyper-glycemic episodes, reviewing his current and  previous labs and insulin doses and developing a plan to avoid hypo- and hyper-glycemia. Please refer to Patient Instructions for Blood Glucose Monitoring and Insulin/Medications Dosing Guide"  in media tab for additional information.   John Lloyd, MD Phone: [phone]  Fax: [phone]   This note was partially dictated with voice recognition software. Similar sounding words can be transcribed inadequately or may not  be corrected upon review.  07/26/2017, 2:18 PM

## 2017-07-27 ENCOUNTER — Encounter: Payer: Self-pay | Admitting: Family Medicine

## 2017-08-08 ENCOUNTER — Ambulatory Visit: Payer: Self-pay | Admitting: Urology

## 2017-08-18 ENCOUNTER — Other Ambulatory Visit: Payer: Self-pay

## 2017-08-18 MED ORDER — GLUCOSE BLOOD VI STRP: ORAL_STRIP | 5 refills | Status: DC

## 2017-08-21 ENCOUNTER — Encounter (HOSPITAL_COMMUNITY): Payer: Self-pay | Admitting: Licensed Clinical Social Worker

## 2017-08-21 ENCOUNTER — Ambulatory Visit (INDEPENDENT_AMBULATORY_CARE_PROVIDER_SITE_OTHER): Payer: Medicare Other | Admitting: Licensed Clinical Social Worker

## 2017-08-21 DIAGNOSIS — F322 Major depressive disorder, single episode, severe without psychotic features: Secondary | ICD-10-CM

## 2017-08-21 NOTE — Progress Notes (Signed)
   THERAPIST PROGRESS NOTE  Session Time: 9:00 am -9:50 am  Participation Level: Active  Behavioral Response: CasualAlertDepressed  Type of Therapy: Individual Therapy  Treatment Goals addressed: Coping  Interventions: CBT and Solution Focused  Summary: John [name] is a 57 y.o. male who presents oriented x5 (person, place, situation, time and object), alert, depressed, average height, overweight, casually dressed, appropriately groomed and cooperative to address mood. Patient has a history of medical treatment including diabetes, and hypertension. Patient has a history of mental health treatment including medication management. Patient denies symptoms of mania. He admits to passive thoughts of suicide in the past but denies a plan or means. Patient also denies homicidal ideations. Patient denies current psychosis but admits to seeing snakes that weren't really present. He denies auditory hallucinations. Patient denies substance abuse. Patient has no history of elopement. Patient is at low risk for lethality at this time.  Physically: Patient feels sluggish. He reported that he had some foot pain for a few days and was not sure why. He also experienced some eye pain for a few days but it went away.  Spiritually/values: Patient is spiritually healthy. Patient is praying, reading his bible, and attending church.  Relationships: Patient has a good relationship with his mother and wife. He is caring for them both due to serious health conditions. Patient continues to have thoughts of his son who passed in 2005.  Emotional/Mental/Behavior:  Patient reported he has good days and bad days. He uses a lot of energy caring for his wife and mother. Patient identified that he exercises daily for at least 20 minutes a day, goes fishing at least once a week and watches television in the evening. Patient also noted that he has questions about his son's death and feels like if he could talk to his son's  mother about the death that he could get closure.  Patient engaged in session. He responded well to interventions. Patient continues to meet criteria for Major depressive disorder, single episode, severe without psychotic features. Patient will continue in outpatient therapy due being the least restrictive service to meet his needs. Patient made minimal progress on his goals at this time.    Suicidal/Homicidal: Negativewithout intent/plan  Therapist Response: Therapist reviewed patient's recent thoughts and behaviors. Therapist utilized CBT to address mood. Therapist processed patient's feelings to identify triggers for mood. Therapist discussed passion fatigue and self care with patient. Therapist also had patient identify how he takes care of himself.   Plan: Return again in 4-6 weeks.  Diagnosis: Axis I: Major Depression, Recurrent severe    Axis II: No diagnosis    John BellowsJoshua Mitsuru Dault, LCSW 08/21/2017

## 2017-08-30 ENCOUNTER — Other Ambulatory Visit (HOSPITAL_COMMUNITY): Payer: Self-pay | Admitting: Psychiatry

## 2017-08-30 MED ORDER — TEMAZEPAM 15 MG PO CAPS: 30.0000 mg | ORAL_CAPSULE | Freq: Every evening | ORAL | 2 refills | Status: DC | PRN

## 2017-09-12 ENCOUNTER — Other Ambulatory Visit: Payer: Self-pay

## 2017-09-12 ENCOUNTER — Telehealth (HOSPITAL_COMMUNITY): Payer: Self-pay | Admitting: *Deleted

## 2017-09-12 MED ORDER — GLUCOSE BLOOD VI STRP: ORAL_STRIP | 5 refills | Status: DC

## 2017-09-12 NOTE — Telephone Encounter (Signed)
Prior authorization for Temazepam received. Called [phone] spoke with Summer E who states that a decision will be faxed within 72 hours. Reference #[id number].

## 2017-09-14 ENCOUNTER — Telehealth (HOSPITAL_COMMUNITY): Payer: Self-pay | Admitting: *Deleted

## 2017-09-14 NOTE — Telephone Encounter (Signed)
Received a denial for Temazepam at 2 capsules daily stating it does not meet the quantity limit requirement. 1 capsule a day will continue to process at the pharmacy.  2 daily Request requires; 1. The higher dose or quantity is supported in the dosage and administration section of the manufacturer's prescribing information OR  2. The higher dose or quantity is supported by one of the following Medicare approved compendia:a] [facility] Drug Information or b] [employer] information system.  Will ask psychiatrist if appeal is needed. PA reference [id number].

## 2017-09-15 ENCOUNTER — Other Ambulatory Visit: Payer: Self-pay

## 2017-09-15 MED ORDER — GLUCOSE BLOOD VI STRP: ORAL_STRIP | 5 refills | Status: DC

## 2017-09-15 NOTE — Telephone Encounter (Signed)
The 30 mg dosage is out of stock at all local pharmacies, that is why I changed it to 2 of the 15 mg capsules

## 2017-09-19 ENCOUNTER — Ambulatory Visit (INDEPENDENT_AMBULATORY_CARE_PROVIDER_SITE_OTHER): Payer: Medicare Other | Admitting: Urology

## 2017-09-19 DIAGNOSIS — N471 Phimosis: Secondary | ICD-10-CM | POA: Diagnosis not present

## 2017-09-25 ENCOUNTER — Ambulatory Visit: Payer: Medicare Other | Admitting: "Endocrinology

## 2017-09-25 ENCOUNTER — Other Ambulatory Visit: Payer: Self-pay

## 2017-09-25 DIAGNOSIS — E1169 Type 2 diabetes mellitus with other specified complication: Secondary | ICD-10-CM | POA: Diagnosis not present

## 2017-09-25 DIAGNOSIS — E669 Obesity, unspecified: Secondary | ICD-10-CM | POA: Diagnosis not present

## 2017-09-25 MED ORDER — ACCU-CHEK AVIVA DEVI: 1.0000 | Freq: Two times a day (BID) | 0 refills | Status: DC

## 2017-09-26 ENCOUNTER — Ambulatory Visit (INDEPENDENT_AMBULATORY_CARE_PROVIDER_SITE_OTHER): Payer: Medicare Other | Admitting: Licensed Clinical Social Worker

## 2017-09-26 ENCOUNTER — Encounter (HOSPITAL_COMMUNITY): Payer: Self-pay | Admitting: Licensed Clinical Social Worker

## 2017-09-26 DIAGNOSIS — F322 Major depressive disorder, single episode, severe without psychotic features: Secondary | ICD-10-CM | POA: Diagnosis not present

## 2017-09-26 LAB — HEMOGLOBIN A1C
EAG (MMOL/L): 9.3 (calc)
Hgb A1c MFr Bld: 7.5 % of total Hgb — ABNORMAL HIGH (ref <5.7)
Mean Plasma Glucose: 169 (calc)
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
## 2017-09-27 ENCOUNTER — Other Ambulatory Visit (HOSPITAL_COMMUNITY): Payer: Self-pay | Admitting: Psychiatry

## 2017-09-28 ENCOUNTER — Encounter (HOSPITAL_COMMUNITY): Payer: Self-pay | Admitting: Psychiatry

## 2017-09-28 ENCOUNTER — Ambulatory Visit (INDEPENDENT_AMBULATORY_CARE_PROVIDER_SITE_OTHER): Payer: Medicare Other | Admitting: Psychiatry

## 2017-09-28 ENCOUNTER — Ambulatory Visit (HOSPITAL_COMMUNITY): Payer: Medicare Other | Admitting: Psychiatry

## 2017-09-28 ENCOUNTER — Other Ambulatory Visit: Payer: Self-pay | Admitting: "Endocrinology

## 2017-09-28 VITALS — BP 124/84 | HR 86 | Ht 68.0 in | Wt 204.0 lb

## 2017-09-28 DIAGNOSIS — F322 Major depressive disorder, single episode, severe without psychotic features: Secondary | ICD-10-CM

## 2017-09-28 DIAGNOSIS — Z560 Unemployment, unspecified: Secondary | ICD-10-CM

## 2017-09-28 MED ORDER — TEMAZEPAM 15 MG PO CAPS: 15.0000 mg | ORAL_CAPSULE | Freq: Every evening | ORAL | 2 refills | Status: DC | PRN

## 2017-09-28 MED ORDER — ALPRAZOLAM 1 MG PO TABS: 1.0000 mg | ORAL_TABLET | Freq: Every day | ORAL | 2 refills | Status: DC | PRN

## 2017-09-28 MED ORDER — FLUOXETINE HCL 20 MG PO CAPS: 20.0000 mg | ORAL_CAPSULE | Freq: Every day | ORAL | 2 refills | Status: DC
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
## 2017-10-05 ENCOUNTER — Encounter: Payer: Self-pay | Admitting: "Endocrinology

## 2017-10-05 ENCOUNTER — Ambulatory Visit (INDEPENDENT_AMBULATORY_CARE_PROVIDER_SITE_OTHER): Payer: Medicare Other | Admitting: "Endocrinology

## 2017-10-05 VITALS — BP 136/83 | HR 82 | Ht 68.0 in | Wt 205.0 lb

## 2017-10-05 DIAGNOSIS — I10 Essential (primary) hypertension: Secondary | ICD-10-CM

## 2017-10-05 DIAGNOSIS — E669 Obesity, unspecified: Secondary | ICD-10-CM | POA: Diagnosis not present

## 2017-10-05 DIAGNOSIS — E1169 Type 2 diabetes mellitus with other specified complication: Secondary | ICD-10-CM

## 2017-10-05 DIAGNOSIS — Z6830 Body mass index (BMI) 30.0-30.9, adult: Secondary | ICD-10-CM

## 2017-10-05 DIAGNOSIS — E6609 Other obesity due to excess calories: Secondary | ICD-10-CM

## 2017-10-05 DIAGNOSIS — E782 Mixed hyperlipidemia: Secondary | ICD-10-CM | POA: Diagnosis not present

## 2017-10-05 NOTE — Patient Instructions (Signed)

## 2017-10-05 NOTE — Progress Notes (Signed)
Subjective:    Patient ID: John [name], male    DOB: 1961/06/10. Patient is being seen in f/u  for management of diabetes requested by  Fayrene Helper, MD  Past Medical History:  Diagnosis Date  . Anxiety   . Depression 2007  . Diabetes mellitus without complication ([place])   . Hyperlipidemia   . Hypertension   . Hypogonadism male    Past Surgical History:  Procedure Laterality Date  . COLONOSCOPY  01/20/2011   Procedure: COLONOSCOPY;  Surgeon: Dorothyann Peng, MD;  Location: [facility];  Service: Endoscopy;  Laterality: N/A;  . HERNIA REPAIR     ventral hernia repair    Social History   Socioeconomic History  . Marital status: Married    Spouse name: Not on file  . Number of children: 1  . Years of education: Not on file  . Highest education level: Not on file  Occupational History  . Occupation: Financial risk analyst  . Financial resource strain: Not on file  . Food insecurity:    Worry: Not on file    Inability: Not on file  . Transportation needs:    Medical: Not on file    Non-medical: Not on file  Tobacco Use  . Smoking status: Never Smoker  . Smokeless tobacco: Never Used  Substance and Sexual Activity  . Alcohol use: Yes    Alcohol/week: 0.6 oz    Types: 1 Cans of beer per week    Comment: occasional beer socially.   . Drug use: No  . Sexual activity: Yes  Lifestyle  . Physical activity:    Days per week: Not on file    Minutes per session: Not on file  . Stress: Not on file  Relationships  . Social connections:    Talks on phone: Not on file    Gets together: Not on file    Attends religious service: Not on file    Active member of club or organization: Not on file    Attends meetings of clubs or organizations: Not on file    Relationship status: Not on file  Other Topics Concern  . Not on file  Social History Narrative  . Not on file   Outpatient Encounter Medications as of 10/05/2017  Medication Sig  . ALPRAZolam (XANAX)  1 MG tablet Take 1 tablet (1 mg total) by mouth daily as needed for anxiety.  [name] atorvastatin (LIPITOR) 40 MG tablet Take 1 tablet (40 mg total) by mouth daily.  . blood glucose meter kit and supplies KIT Dispense based on patient and insurance preference. Use up to four times daily as directed. (FOR ICD-9 250.00, 250.01).  . Blood Glucose Monitoring Suppl (ACCU-CHEK AVIVA) device 1 each by Other route 2 (two) times daily. Use as instructed bid. E11.65  . FLUoxetine (PROZAC) 20 MG capsule Take 1 capsule (20 mg total) by mouth daily.  [name] glucose blood (ACCU-CHEK AVIVA) test strip Use to test blood glucose 4 times a day. E11.65  . LANTUS SOLOSTAR 100 UNIT/ML Solostar Pen INJECT 50 UNITS SUBCUTANEOUSLY AT BEDTIME  . lisinopril-hydrochlorothiazide (PRINZIDE,ZESTORETIC) 10-12.5 MG tablet Take 1 tablet by mouth daily.  . metFORMIN (GLUCOPHAGE) 500 MG tablet Take 500 mg by mouth daily with breakfast.  . temazepam (RESTORIL) 15 MG capsule Take 1 capsule (15 mg total) by mouth at bedtime as needed for sleep.   No facility-administered encounter medications on file as of 10/05/2017.    ALLERGIES: Allergies  Allergen Reactions  . Januvia [Sitagliptin] Nausea And Vomiting  . Metformin And Related Nausea Only  . Poison Oak Extract [Poison Oak Extract] Dermatitis   VACCINATION STATUS: Immunization History  Administered Date(s) Administered  . Influenza Whole 03/18/2010, 04/13/2011  . Influenza,inj,Quad PF,6+ Mos 05/20/2013, 05/08/2014, 06/11/2015, 04/18/2017  . Pneumococcal Polysaccharide-23 11/26/2013  . Td 09/15/2009    Diabetes  He presents for his follow-up diabetic visit. He has type 2 diabetes mellitus. Onset time: He was diagnosed at age 31. His disease course has been improving (Patient was supposed to return for a follow-up visit after his last August 2017, unfortunately he never showed up.). There are no hypoglycemic associated symptoms. Pertinent negatives for hypoglycemia include no  headaches, seizures or tremors. Associated symptoms include blurred vision. Pertinent negatives for diabetes include no chest pain, no polydipsia and no polyuria. There are no hypoglycemic complications. Symptoms are improving. There are no diabetic complications. Risk factors for coronary artery disease include dyslipidemia, diabetes mellitus, hypertension, male sex, obesity and sedentary lifestyle. Current diabetic treatment includes oral agent (monotherapy). His weight is stable. He is following a generally unhealthy diet. He has not had a previous visit with a dietitian. He never participates in exercise. His breakfast blood glucose range is generally 140-180 mg/dl. His bedtime blood glucose range is generally 180-200 mg/dl. His overall blood glucose range is 140-180 mg/dl. (He was supposed to present with for tests daily, unfortunately presents with only fasting blood glucose monitoring which are improving. He says that he did not have enough test strips which she did not report about. His most recent A1c was high at 14.1%.) An ACE inhibitor/angiotensin II receptor blocker is being taken. Eye exam is current.  Hyperlipidemia  This is a chronic problem. The current episode started more than 1 year ago. The problem is uncontrolled. Exacerbating diseases include diabetes and obesity. Pertinent negatives include no chest pain, myalgias or shortness of breath. Current antihyperlipidemic treatment includes statins. Risk factors for coronary artery disease include diabetes mellitus, hypertension, male sex, obesity, a sedentary lifestyle and dyslipidemia.  Hypertension  This is a chronic problem. The current episode started more than 1 year ago. The problem is controlled. Associated symptoms include blurred vision. Pertinent negatives include no chest pain, headaches, palpitations or shortness of breath. Risk factors for coronary artery disease include dyslipidemia, diabetes mellitus, male gender, obesity and  sedentary lifestyle. Past treatments include ACE inhibitors and diuretics.    Review of Systems  Constitutional: Negative for chills and fever.  Eyes: Positive for blurred vision and visual disturbance.  Respiratory: Negative for cough and shortness of breath.   Cardiovascular: Negative for chest pain and palpitations.       No Shortness of breath  Gastrointestinal: Negative for abdominal pain, diarrhea, nausea and vomiting.  Endocrine: Negative for polydipsia and polyuria.  Genitourinary: Negative for frequency, hematuria and urgency.  Musculoskeletal: Negative for myalgias.  Skin: Negative for rash.  Neurological: Negative for tremors, seizures and headaches.  Hematological: Does not bruise/bleed easily.  Psychiatric/Behavioral: Negative for hallucinations and suicidal ideas.    Objective:    BP 136/83   Pulse 82   Ht _0  (1.727 m)   Wt 205 lb (93 kg)   BMI 31.17 kg/m   Wt Readings from Last 3 Encounters:  10/05/17 205 lb (93 kg)  07/26/17 206 lb (93.4 kg)  07/19/17 204 lb 0.6 oz (92.6 kg)    Physical Exam  Constitutional: He is oriented to person, place, and time. He appears well-developed. He  is cooperative. No distress.  HENT:  Head: Normocephalic and atraumatic.  Eyes: EOM are normal.  Neck: Normal range of motion. Neck supple. No tracheal deviation present. No thyromegaly present.  Cardiovascular: Normal rate, S1 normal and S2 normal. Exam reveals no gallop.  No murmur heard. Pulses:      Dorsalis pedis pulses are 1+ on the right side, and 1+ on the left side.       Posterior tibial pulses are 1+ on the right side, and 1+ on the left side.  Pulmonary/Chest: Effort normal. No respiratory distress. He has no wheezes.  Abdominal: He exhibits no distension. There is no tenderness. There is no guarding and no CVA tenderness.  Musculoskeletal: He exhibits no edema or deformity.       Right shoulder: He exhibits no swelling and no deformity.  Neurological: He is alert  and oriented to person, place, and time. He has normal strength and normal reflexes. No cranial nerve deficit or sensory deficit. Gait normal.  Skin: Skin is warm and dry. No rash noted. No cyanosis. Nails show no clubbing.  Psychiatric: He has a normal mood and affect. His speech is normal. Judgment normal. Cognition and memory are normal.     CMP ( most recent) CMP     Component Value Date/Time   NA 134 (L) 07/19/2017 1014   K 4.1 07/19/2017 1014   CL 97 (L) 07/19/2017 1014   CO2 29 07/19/2017 1014   GLUCOSE 221 (H) 07/19/2017 1014   BUN 12 07/19/2017 1014   CREATININE 1.23 07/19/2017 1014   CALCIUM 9.9 07/19/2017 1014   PROT 7.7 07/19/2017 1014   ALBUMIN 4.1 12/30/2016 0950   AST 23 07/19/2017 1014   ALT 33 07/19/2017 1014   ALKPHOS 95 12/30/2016 0950   BILITOT 0.8 07/19/2017 1014   GFRNONAA 65 07/19/2017 1014   GFRAA 75 07/19/2017 1014    Lab Results  Component Value Date   HGBA1C 7.5 (H) 09/25/2017   HGBA1C 9.1 (H) 04/05/2017   HGBA1C 14.1 (H) 12/30/2016    Lipid Panel     Component Value Date/Time   CHOL 194 07/19/2017 1014   TRIG 144 07/19/2017 1014   HDL 41 07/19/2017 1014   CHOLHDL 4.7 07/19/2017 1014   VLDL 64 (H) 12/30/2016 0950   LDLCALC 127 (H) 07/19/2017 1014     Assessment & Plan:   1. Diabetes mellitus type 2 in obese [facility])  - Patient has currently uncontrolled symptomatic type 2 DM since  57 years of age. - Mr. Bulger returns with significant improvement in his glycemic profile.  -His recent labs show A1c of 7.5%, generally improving from 14.1%   He is diabetes is complicated by obesity , prior history of noncompliance/nonadherence, and patient remains at a high risk for more acute and chronic complications of diabetes which include CAD, CVA, CKD, retinopathy, and neuropathy. These are all discussed in detail with the patient.  - I have counseled the patient on diet management and weight loss, by adopting a carbohydrate restricted/protein  rich diet.  -  Suggestion is made for him to avoid simple carbohydrates  from his diet including Cakes, Sweet Desserts / Pastries, Ice Cream, Soda (diet and regular), Sweet Tea, Candies, Chips, Cookies, Store Bought Juices, Alcohol in Excess of  1-2 drinks a day, Artificial Sweeteners, and "Sugar-free" Products. This will help patient to have stable blood glucose profile and potentially avoid unintended weight gain.  - I encouraged the patient to switch to  unprocessed or  minimally processed complex starch and increased protein intake (animal or plant source), fruits, and vegetables.  - Patient is advised to stick to a routine mealtimes to eat 3 meals  a day and avoid unnecessary snacks ( to snack only to correct hypoglycemia).   - I have approached patient with the following individualized plan to manage diabetes and patient agrees:   -Based on his presentation with controlled glycemia post fasting and postprandially and improved A1c of 7.5%, he will not require bolus insulin at this time.   - I advised him to continue Lantus 50 units at bedtime associated with strict monitoring of blood glucose 2 times a day-daily before breakfast and at bedtime.   -Patient is encouraged to call clinic for blood glucose levels less than 70 or above 300 mg /dl.  - I advised him to continue metformin  500 mg by mouth once a day after breakfast, he did not tolerate higher dose of metformin.  - Patient will be considered for incretin therapy as appropriate next visit. - Patient specific target  A1c;  LDL, HDL, Triglycerides, and  Waist Circumference were discussed in detail.  2) BP/HTN: His blood pressure is controlled to target.  He is advised to continue his current blood pressure medications including Monopril/hydrochlorothiazide  10/12.5 mg po daily. 3) Lipids/HPL:  uncontrolled,  LDL  improving to 127 from  275, and triglycerides improving to 140 from 319. I  have advised him to be consistent in taking his  simvastatin 40 mg by mouth daily at bedtime. Side effects and precautions discussed with him.   4)  Weight/Diet: CDE Consult has been  initiated , exercise, and detailed carbohydrates information provided.  5) Chronic Care/Health Maintenance:  -Patient is on ACEI/ARB and Statin medications and encouraged to continue to follow up with Ophthalmology, Podiatrist at least yearly or according to recommendations, and advised to   stay away from smoking. I have recommended yearly flu vaccine and pneumonia vaccination at least every 5 years; moderate intensity exercise for up to 150 minutes weekly; and  sleep for at least 7 hours a day.  - I advised patient to maintain close follow up with Fayrene Helper, MD for primary care needs. - Time spent with the patient: 25 min, of which >50% was spent in reviewing his blood glucose logs , discussing his hypo- and hyper-glycemic episodes, reviewing his current and  previous labs and insulin doses and developing a plan to avoid hypo- and hyper-glycemia. Please refer to Patient Instructions for Blood Glucose Monitoring and Insulin/Medications Dosing Guide"  in media tab for additional information. [name] participated in the discussions, expressed understanding, and voiced agreement with the above plans.  All questions were answered to his satisfaction. he is encouraged to contact clinic should he have any questions or concerns prior to his return visit.   Follow up plan: - Return in about 4 months (around 02/04/2018) for meter, and logs.  Glade Lloyd, MD Phone: [phone]  Fax: [phone]   This note was partially dictated with voice recognition software. Similar sounding words can be transcribed inadequately or may not  be corrected upon review.  10/05/2017, 5:03 PM

## 2017-11-01 ENCOUNTER — Ambulatory Visit (HOSPITAL_COMMUNITY): Payer: Self-pay | Admitting: Licensed Clinical Social Worker

## 2017-11-22 ENCOUNTER — Ambulatory Visit: Payer: Self-pay | Admitting: Family Medicine

## 2017-12-27 ENCOUNTER — Ambulatory Visit (INDEPENDENT_AMBULATORY_CARE_PROVIDER_SITE_OTHER): Payer: Medicare Other | Admitting: Family Medicine

## 2017-12-27 ENCOUNTER — Other Ambulatory Visit: Payer: Self-pay

## 2017-12-27 ENCOUNTER — Encounter: Payer: Self-pay | Admitting: Family Medicine

## 2017-12-27 VITALS — BP 128/80 | HR 67 | Ht 68.0 in | Wt 201.1 lb

## 2017-12-27 DIAGNOSIS — I10 Essential (primary) hypertension: Secondary | ICD-10-CM | POA: Diagnosis not present

## 2017-12-27 DIAGNOSIS — E669 Obesity, unspecified: Secondary | ICD-10-CM

## 2017-12-27 DIAGNOSIS — E782 Mixed hyperlipidemia: Secondary | ICD-10-CM | POA: Diagnosis not present

## 2017-12-27 DIAGNOSIS — F418 Other specified anxiety disorders: Secondary | ICD-10-CM | POA: Diagnosis not present

## 2017-12-27 DIAGNOSIS — E1169 Type 2 diabetes mellitus with other specified complication: Secondary | ICD-10-CM | POA: Diagnosis not present

## 2017-12-27 NOTE — Assessment & Plan Note (Signed)
Controlled, no change in medication John [name] is reminded of the importance of commitment to daily physical activity for 30 minutes or more, as able and the need to limit carbohydrate intake to 30 to 60 grams per meal to help with blood sugar control.   The need to take medication as prescribed, test blood sugar as directed, and to call between visits if there is a concern that blood sugar is uncontrolled is also discussed.   John [name] is reminded of the importance of daily foot exam, annual eye examination, and good blood sugar, blood pressure and cholesterol control.  Diabetic Labs Latest Ref Rng & Units 09/25/2017 07/19/2017 04/05/2017 12/30/2016 12/28/2016  HbA1c <5.7 % of total Hgb 7.5(H) - 9.1(H) 14.1(H) >14%  Microalbumin Not Estab. ug/mL - - - 134.8(H) -  Micro/Creat Ratio <30 mcg/mg creat - - - - -  Chol <200 mg/dL - 474194 259(D229(H) 638(V378(H) -  HDL >40 mg/dL - 41 56(E36(L) 33(I39(L) -  Calc LDL mg/dL (calc) - 951(O127(H) 841(Y164(H) 275(H) -  Triglycerides <150 mg/dL - 606144 301148 601(U319(H) -  Creatinine 0.70 - 1.33 mg/dL - 9.321.23 3.551.14 - 7.321.22   BP/Weight 12/27/2017 10/05/2017 07/26/2017 07/19/2017 04/25/2017 04/18/2017 03/08/2017  Systolic BP 128 136 134 130 125 108 148  Diastolic BP 80 83 70 70 83 70 87  Wt. (Lbs) 201.08 205 206 204.04 200 201.25 201  BMI 30.57 31.17 31.32 31.02 30.41 30.6 30.56  Some encounter information is confidential and restricted. Go to Review Flowsheets activity to see all data.   Foot/eye exam completion dates 04/18/2017 01/26/2016  Foot Form Completion Done Done

## 2017-12-27 NOTE — Patient Instructions (Addendum)
Welcome to medicare with MD visit in early December, call if you need me before.  Congrats on good blood pressure and improved blood sugar, keep up great  Habits    Fasting lipid and hepatic panel next time you are getting labs for Dr Fransico HimNida  ( august)  You are referred for eye exam  Keep on fishing!! ( need to see newspaper clipping next visit!)  It is important that you exercise regularly at least 30 minutes 5 times a week. If you develop chest pain, have severe difficulty breathing, or feel very tired, stop exercising immediately and seek medical attention  .

## 2017-12-28 ENCOUNTER — Encounter (HOSPITAL_COMMUNITY): Payer: Self-pay | Admitting: Psychiatry

## 2017-12-28 ENCOUNTER — Ambulatory Visit (INDEPENDENT_AMBULATORY_CARE_PROVIDER_SITE_OTHER): Payer: Medicare Other | Admitting: Psychiatry

## 2017-12-28 VITALS — BP 138/90 | HR 80 | Ht 68.0 in | Wt 200.0 lb

## 2017-12-28 DIAGNOSIS — F322 Major depressive disorder, single episode, severe without psychotic features: Secondary | ICD-10-CM

## 2017-12-28 MED ORDER — FLUOXETINE HCL 20 MG PO CAPS: 20.0000 mg | ORAL_CAPSULE | Freq: Every day | ORAL | 2 refills | Status: DC

## 2017-12-28 MED ORDER — TEMAZEPAM 30 MG PO CAPS: 30.0000 mg | ORAL_CAPSULE | Freq: Every day | ORAL | 2 refills | Status: DC

## 2017-12-28 MED ORDER — ALPRAZOLAM 1 MG PO TABS: 1.0000 mg | ORAL_TABLET | Freq: Every day | ORAL | 2 refills | Status: DC | PRN
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
## 2017-12-31 ENCOUNTER — Encounter: Payer: Self-pay | Admitting: Family Medicine

## 2017-12-31 NOTE — Progress Notes (Signed)
John [name]     MRN: [id number]      DOB: 1960/08/29   HPI John [name] is here for follow up and re-evaluation of chronic medical conditions, medication management and review of any available recent lab and radiology data.  Preventive health is updated, specifically  Cancer screening and Immunization.   Continues to follow up with endocrinology and psychiatry and is doing very well. He is fishing regularly and he enjoys this, was recently featured in the newspaper having caught a big 10 pound [place] Foil  The PT denies any adverse reactions to current medications since the last visit.  There are no new concerns.  There are no specific complaints  Denies polyuria, polydipsia, blurred vision , or hypoglycemic episodes.   ROS Denies recent fever or chills. Denies sinus pressure, nasal congestion, ear pain or sore throat. Denies chest congestion, productive cough or wheezing. Denies chest pains, palpitations and leg swelling Denies abdominal pain, nausea, vomiting,diarrhea or constipation.   Denies dysuria, frequency, hesitancy or incontinence. Denies joint pain, swelling and limitation in mobility. Denies headaches, seizures, numbness, or tingling. Denies depression, anxiety or insomnia. Denies skin break down or rash.   PE  BP 128/80   Pulse 67   Ht 5\' 8"  (1.727 m)   Wt 201 lb 1.3 oz (91.2 kg)   SpO2 97%   BMI 30.57 kg/m   Patient alert and oriented and in no cardiopulmonary distress.  HEENT: No facial asymmetry, EOMI,   oropharynx pink and moist.  Neck supple no JVD, no mass.  Chest: Clear to auscultation bilaterally.  CVS: S1, S2 no murmurs, no S3.Regular rate.  ABD: Soft non tender.   Ext: No edema  MS: Adequate ROM spine, shoulders, hips and knees.  Skin: Intact, no ulcerations or rash noted.  Psych: Good eye contact, normal affect. Memory intact not anxious or depressed appearing.  CNS: CN 2-12 intact, power,  normal throughout.no focal deficits  noted.   Assessment & Plan  Diabetes mellitus type 2 in obese (HCC) Controlled, no change in medication John [name] is reminded of the importance of commitment to daily physical activity for 30 minutes or more, as able and the need to limit carbohydrate intake to 30 to 60 grams per meal to help with blood sugar control.   The need to take medication as prescribed, test blood sugar as directed, and to call between visits if there is a concern that blood sugar is uncontrolled is also discussed.   John [name] is reminded of the importance of daily foot exam, annual eye examination, and good blood sugar, blood pressure and cholesterol control.  Diabetic Labs Latest Ref Rng & Units 09/25/2017 07/19/2017 04/05/2017 12/30/2016 12/28/2016  HbA1c <5.7 % of total Hgb 7.5(H) - 9.1(H) 14.1(H) >14%  Microalbumin Not Estab. ug/mL - - - 134.8(H) -  Micro/Creat Ratio <30 mcg/mg creat - - - - -  Chol <200 mg/dL - 119 147(W) 295(A) -  HDL >40 mg/dL - 41 21(H) 08(M) -  Calc LDL mg/dL (calc) - 578(I) 696(E) 275(H) -  Triglycerides <150 mg/dL - 952 841 324(M) -  Creatinine 0.70 - 1.33 mg/dL - 0.10 2.72 - 5.36   BP/Weight 12/27/2017 10/05/2017 07/26/2017 07/19/2017 04/25/2017 04/18/2017 03/08/2017  Systolic BP 128 136 134 130 125 108 148  Diastolic BP 80 83 70 70 83 70 87  Wt. (Lbs) 201.08 205 206 204.04 200 201.25 201  BMI 30.57 31.17 31.32 31.02 30.41 30.6 30.56  Some encounter information is confidential and restricted.  Go to Review Flowsheets activity to see all data.   Foot/eye exam completion dates 04/18/2017 01/26/2016  Foot Form Completion Done Done        Essential hypertension Controlled, no change in medication DASH diet and commitment to daily physical activity for a minimum of 30 minutes discussed and encouraged, as a part of hypertension management. The importance of attaining a healthy weight is also discussed.  BP/Weight 12/27/2017 10/05/2017 07/26/2017 07/19/2017 04/25/2017 04/18/2017 03/08/2017   Systolic BP 128 136 134 130 125 108 148  Diastolic BP 80 83 70 70 83 70 87  Wt. (Lbs) 201.08 205 206 204.04 200 201.25 201  BMI 30.57 31.17 31.32 31.02 30.41 30.6 30.56  Some encounter information is confidential and restricted. Go to Review Flowsheets activity to see all data.       Depression with anxiety Managed by psychiatry and doing extremely well.  Medication management per psychiatry, adjustment will be by treating MD, though improved, not at goal, pt not suicidal or homicidal and much improve, now energized and enjoying going out and doing things  Obesity (BMI 30.0-34.9) Improved Patient re-educated about  the importance of commitment to a  minimum of 150 minutes of exercise per week.  The importance of healthy food choices with portion control discussed. Encouraged to start a food diary, count calories and to consider  joining a support group. Sample diet sheets offered. Goals set by the patient for the next several months.   Weight /BMI 12/27/2017 10/05/2017 07/26/2017  WEIGHT 201 lb 1.3 oz 205 lb 206 lb  HEIGHT 5\' 8"  5\' 8"  5\' 8"   BMI 30.57 kg/m2 31.17 kg/m2 31.32 kg/m2  Some encounter information is confidential and restricted. Go to Review Flowsheets activity to see all data.      Mixed hyperlipidemia Hyperlipidemia:Low fat diet discussed and encouraged.   Lipid Panel  Lab Results  Component Value Date   CHOL 194 07/19/2017   HDL 41 07/19/2017   LDLCALC 127 (H) 07/19/2017   TRIG 144 07/19/2017   CHOLHDL 4.7 07/19/2017   Not at goal. Updated lab needed at/ before next visit.

## 2017-12-31 NOTE — Assessment & Plan Note (Addendum)
Improved Patient re-educated about  the importance of commitment to a  minimum of 150 minutes of exercise per week.  The importance of healthy food choices with portion control discussed. Encouraged to start a food diary, count calories and to consider  joining a support group. Sample diet sheets offered. Goals set by the patient for the next several months.   Weight /BMI 12/27/2017 10/05/2017 07/26/2017  WEIGHT 201 lb 1.3 oz 205 lb 206 lb  HEIGHT 5\' 8"  5\' 8"  5\' 8"   BMI 30.57 kg/m2 31.17 kg/m2 31.32 kg/m2  Some encounter information is confidential and restricted. Go to Review Flowsheets activity to see all data.

## 2017-12-31 NOTE — Assessment & Plan Note (Signed)
Hyperlipidemia:Low fat diet discussed and encouraged.   Lipid Panel  Lab Results  Component Value Date   CHOL 194 07/19/2017   HDL 41 07/19/2017   LDLCALC 127 (H) 07/19/2017   TRIG 144 07/19/2017   CHOLHDL 4.7 07/19/2017   Not at goal. Updated lab needed at/ before next visit.

## 2017-12-31 NOTE — Assessment & Plan Note (Addendum)
Managed by psychiatry and doing extremely well.  Medication management per psychiatry, adjustment will be by treating MD, though improved, not at goal, pt not suicidal or homicidal and much improve, now energized and enjoying going out and doing things

## 2017-12-31 NOTE — Assessment & Plan Note (Signed)
Controlled, no change in medication DASH diet and commitment to daily physical activity for a minimum of 30 minutes discussed and encouraged, as a part of hypertension management. The importance of attaining a healthy weight is also discussed.  BP/Weight 12/27/2017 10/05/2017 07/26/2017 07/19/2017 04/25/2017 04/18/2017 03/08/2017  Systolic BP 128 136 134 130 125 108 148  Diastolic BP 80 83 70 70 83 70 87  Wt. (Lbs) 201.08 205 206 204.04 200 201.25 201  BMI 30.57 31.17 31.32 31.02 30.41 30.6 30.56  Some encounter information is confidential and restricted. Go to Review Flowsheets activity to see all data.

## 2018-01-20 ENCOUNTER — Other Ambulatory Visit: Payer: Self-pay | Admitting: Family Medicine

## 2018-01-20 DIAGNOSIS — E663 Overweight: Secondary | ICD-10-CM

## 2018-01-20 DIAGNOSIS — E782 Mixed hyperlipidemia: Secondary | ICD-10-CM

## 2018-01-20 DIAGNOSIS — I10 Essential (primary) hypertension: Secondary | ICD-10-CM

## 2018-01-20 DIAGNOSIS — Z23 Encounter for immunization: Secondary | ICD-10-CM

## 2018-01-20 DIAGNOSIS — F418 Other specified anxiety disorders: Secondary | ICD-10-CM

## 2018-01-20 DIAGNOSIS — E1169 Type 2 diabetes mellitus with other specified complication: Secondary | ICD-10-CM

## 2018-01-20 DIAGNOSIS — E669 Obesity, unspecified: Secondary | ICD-10-CM

## 2018-01-24 ENCOUNTER — Other Ambulatory Visit: Payer: Self-pay

## 2018-01-24 MED ORDER — LISINOPRIL-HYDROCHLOROTHIAZIDE 10-12.5 MG PO TABS: 1.0000 | ORAL_TABLET | Freq: Every day | ORAL | 0 refills | Status: DC

## 2018-02-06 ENCOUNTER — Ambulatory Visit: Payer: Medicare Other | Admitting: "Endocrinology

## 2018-02-06 DIAGNOSIS — E669 Obesity, unspecified: Secondary | ICD-10-CM | POA: Diagnosis not present

## 2018-02-06 DIAGNOSIS — E1169 Type 2 diabetes mellitus with other specified complication: Secondary | ICD-10-CM | POA: Diagnosis not present

## 2018-02-07 ENCOUNTER — Other Ambulatory Visit: Payer: Self-pay | Admitting: "Endocrinology

## 2018-02-07 LAB — COMPLETE METABOLIC PANEL WITH GFR
AG RATIO: 1.4 (calc) (ref 1.0–2.5)
ALKALINE PHOSPHATASE (APISO): 92 U/L (ref 40–115)
ALT: 32 U/L (ref 9–46)
AST: 24 U/L (ref 10–35)
Albumin: 4.1 g/dL (ref 3.6–5.1)
BILIRUBIN TOTAL: 0.7 mg/dL (ref 0.2–1.2)
BUN: 16 mg/dL (ref 7–25)
CHLORIDE: 100 mmol/L (ref 98–110)
CO2: 29 mmol/L (ref 20–32)
Calcium: 9.3 mg/dL (ref 8.6–10.3)
Creat: 1.13 mg/dL (ref 0.70–1.33)
GFR, Est African American: 83 mL/min/{1.73_m2} (ref >=60)
GFR, Est Non African American: 72 mL/min/{1.73_m2} (ref >=60)
Globulin: 2.9 g/dL (calc) (ref 1.9–3.7)
Glucose, Bld: 148 mg/dL — ABNORMAL HIGH (ref 65–139)
POTASSIUM: 3.8 mmol/L (ref 3.5–5.3)
Sodium: 137 mmol/L (ref 135–146)
Total Protein: 7 g/dL (ref 6.1–8.1)

## 2018-02-07 LAB — MICROALBUMIN / CREATININE URINE RATIO
Creatinine, Urine: 203 mg/dL (ref 20–320)
MICROALB UR: 5.3 mg/dL
MICROALB/CREAT RATIO: 26 ug/mg{creat} (ref <30)

## 2018-02-07 LAB — HEMOGLOBIN A1C
Hgb A1c MFr Bld: 7.4 % of total Hgb — ABNORMAL HIGH (ref <5.7)
Mean Plasma Glucose: 166 (calc)
eAG (mmol/L): 9.2 (calc)

## 2018-03-05 ENCOUNTER — Other Ambulatory Visit: Payer: Self-pay | Admitting: "Endocrinology

## 2018-03-14 ENCOUNTER — Ambulatory Visit: Payer: Self-pay | Admitting: Urology

## 2018-03-14 ENCOUNTER — Encounter: Payer: Self-pay | Admitting: "Endocrinology

## 2018-03-14 ENCOUNTER — Ambulatory Visit (INDEPENDENT_AMBULATORY_CARE_PROVIDER_SITE_OTHER): Payer: Medicare Other | Admitting: "Endocrinology

## 2018-03-14 VITALS — BP 134/86 | HR 77 | Ht 68.0 in | Wt 205.0 lb

## 2018-03-14 DIAGNOSIS — E782 Mixed hyperlipidemia: Secondary | ICD-10-CM

## 2018-03-14 DIAGNOSIS — E6609 Other obesity due to excess calories: Secondary | ICD-10-CM

## 2018-03-14 DIAGNOSIS — Z6830 Body mass index (BMI) 30.0-30.9, adult: Secondary | ICD-10-CM

## 2018-03-14 DIAGNOSIS — E669 Obesity, unspecified: Secondary | ICD-10-CM

## 2018-03-14 DIAGNOSIS — I10 Essential (primary) hypertension: Secondary | ICD-10-CM

## 2018-03-14 DIAGNOSIS — E1169 Type 2 diabetes mellitus with other specified complication: Secondary | ICD-10-CM

## 2018-03-14 MED ORDER — INSULIN GLARGINE 100 UNIT/ML SOLOSTAR PEN: 60.0000 [IU] | PEN_INJECTOR | Freq: Every day | SUBCUTANEOUS | 2 refills | Status: DC

## 2018-03-14 MED ORDER — METFORMIN HCL 500 MG PO TABS: 500.0000 mg | ORAL_TABLET | Freq: Two times a day (BID) | ORAL | 2 refills | Status: DC

## 2018-03-14 NOTE — Patient Instructions (Signed)

## 2018-03-14 NOTE — Progress Notes (Signed)
Endocrinology follow-up note   Subjective:    Patient ID: John [name], male    DOB: 11-01-1960. Patient is being seen in f/u  for management of chronically uncontrolled type 2 diabetes, hyperlipidemia, hypertension. PMD:   Fayrene Helper, MD  Past Medical History:  Diagnosis Date  . Anxiety   . Depression 2007  . Diabetes mellitus without complication ([place])   . Hyperlipidemia   . Hypertension   . Hypogonadism male    Past Surgical History:  Procedure Laterality Date  . COLONOSCOPY  01/20/2011   Procedure: COLONOSCOPY;  Surgeon: Dorothyann Peng, MD;  Location: [facility];  Service: Endoscopy;  Laterality: N/A;  . HERNIA REPAIR     ventral hernia repair    Social History   Socioeconomic History  . Marital status: Married    Spouse name: Not on file  . Number of children: 1  . Years of education: Not on file  . Highest education level: Not on file  Occupational History  . Occupation: Financial risk analyst  . Financial resource strain: Not on file  . Food insecurity:    Worry: Not on file    Inability: Not on file  . Transportation needs:    Medical: Not on file    Non-medical: Not on file  Tobacco Use  . Smoking status: Never Smoker  . Smokeless tobacco: Never Used  Substance and Sexual Activity  . Alcohol use: Yes    Alcohol/week: 1.0 standard drinks    Types: 1 Cans of beer per week    Comment: occasional beer socially.   . Drug use: No  . Sexual activity: Yes  Lifestyle  . Physical activity:    Days per week: Not on file    Minutes per session: Not on file  . Stress: Not on file  Relationships  . Social connections:    Talks on phone: Not on file    Gets together: Not on file    Attends religious service: Not on file    Active member of club or organization: Not on file    Attends meetings of clubs or organizations: Not on file    Relationship status: Not on file  Other Topics Concern  . Not on file  Social History Narrative  . Not  on file   Outpatient Encounter Medications as of 03/14/2018  Medication Sig  . ALPRAZolam (XANAX) 1 MG tablet Take 1 tablet (1 mg total) by mouth daily as needed for anxiety.  [name] atorvastatin (LIPITOR) 20 MG tablet TAKE 1 TABLET BY MOUTH ONCE DAILY  . atorvastatin (LIPITOR) 40 MG tablet TAKE 1 TABLET BY MOUTH ONCE DAILY  . blood glucose meter kit and supplies KIT Dispense based on patient and insurance preference. Use up to four times daily as directed. (FOR ICD-9 250.00, 250.01).  . Blood Glucose Monitoring Suppl (ACCU-CHEK AVIVA) device 1 each by Other route 2 (two) times daily. Use as instructed bid. E11.65  . FLUoxetine (PROZAC) 20 MG capsule Take 1 capsule (20 mg total) by mouth daily.  [name] glucose blood (ACCU-CHEK AVIVA) test strip Use to test blood glucose 4 times a day. E11.65  . Insulin Glargine (LANTUS SOLOSTAR) 100 UNIT/ML Solostar Pen Inject 60 Units into the skin at bedtime.  [name] lisinopril-hydrochlorothiazide (PRINZIDE,ZESTORETIC) 10-12.5 MG tablet Take 1 tablet by mouth daily.  . metFORMIN (GLUCOPHAGE) 500 MG tablet Take 1 tablet (500 mg total) by mouth 2 (two) times daily with a meal.  . temazepam (RESTORIL)  30 MG capsule Take 1 capsule (30 mg total) by mouth at bedtime.  . [DISCONTINUED] LANTUS SOLOSTAR 100 UNIT/ML Solostar Pen INJECT 50 UNITS SUBCUTANEOUSLY AT BEDTIME  . [DISCONTINUED] metFORMIN (GLUCOPHAGE) 1000 MG tablet Take 500 mg by mouth 2 (two) times daily with a meal.   No facility-administered encounter medications on file as of 03/14/2018.    ALLERGIES: Allergies  Allergen Reactions  . Januvia [Sitagliptin] Nausea And Vomiting  . Metformin And Related Nausea Only  . Poison Oak Extract [Poison Oak Extract] Dermatitis   VACCINATION STATUS: Immunization History  Administered Date(s) Administered  . Influenza Whole 03/18/2010, 04/13/2011  . Influenza,inj,Quad PF,6+ Mos 05/20/2013, 05/08/2014, 06/11/2015, 04/18/2017  . Pneumococcal Polysaccharide-23 11/26/2013  . Td  09/15/2009    Diabetes  He presents for his follow-up diabetic visit. He has type 2 diabetes mellitus. Onset time: He was diagnosed at age 43. His disease course has been stable (Patient was supposed to return for a follow-up visit after his last August 2017, unfortunately he never showed up.). There are no hypoglycemic associated symptoms. Pertinent negatives for hypoglycemia include no headaches, seizures or tremors. Associated symptoms include blurred vision. Pertinent negatives for diabetes include no chest pain, no polydipsia and no polyuria. There are no hypoglycemic complications. Symptoms are stable. There are no diabetic complications. Risk factors for coronary artery disease include dyslipidemia, diabetes mellitus, hypertension, male sex, obesity and sedentary lifestyle. Current diabetic treatment includes oral agent (monotherapy). His weight is fluctuating minimally. He is following a generally unhealthy diet. He has not had a previous visit with a dietitian. He never participates in exercise. His breakfast blood glucose range is generally 140-180 mg/dl. His bedtime blood glucose range is generally 180-200 mg/dl. His overall blood glucose range is 140-180 mg/dl. (He was supposed to present with for tests daily, unfortunately presents with only fasting blood glucose monitoring which are improving. He says that he did not have enough test strips which she did not report about. His most recent A1c was high at 14.1%.) An ACE inhibitor/angiotensin II receptor blocker is being taken. Eye exam is current.  Hyperlipidemia  This is a chronic problem. The current episode started more than 1 year ago. The problem is uncontrolled. Exacerbating diseases include diabetes and obesity. Pertinent negatives include no chest pain, myalgias or shortness of breath. Current antihyperlipidemic treatment includes statins. Risk factors for coronary artery disease include diabetes mellitus, hypertension, male sex, obesity,  a sedentary lifestyle and dyslipidemia.  Hypertension  This is a chronic problem. The current episode started more than 1 year ago. The problem is controlled. Associated symptoms include blurred vision. Pertinent negatives include no chest pain, headaches, palpitations or shortness of breath. Risk factors for coronary artery disease include dyslipidemia, diabetes mellitus, male gender, obesity and sedentary lifestyle. Past treatments include ACE inhibitors and diuretics.    Review of Systems  Constitutional: Negative for chills and fever.  Eyes: Positive for blurred vision and visual disturbance.  Respiratory: Negative for cough and shortness of breath.   Cardiovascular: Negative for chest pain and palpitations.       No Shortness of breath  Gastrointestinal: Negative for abdominal pain, diarrhea, nausea and vomiting.  Endocrine: Negative for polydipsia and polyuria.  Genitourinary: Negative for frequency, hematuria and urgency.  Musculoskeletal: Negative for myalgias.  Skin: Negative for rash.  Neurological: Negative for tremors, seizures and headaches.  Hematological: Does not bruise/bleed easily.  Psychiatric/Behavioral: Negative for hallucinations and suicidal ideas.    Objective:    BP 134/86   Pulse 77  Ht '5\' 8"'  (1.727 m)   Wt 205 lb (93 kg)   BMI 31.17 kg/m   Wt Readings from Last 3 Encounters:  03/14/18 205 lb (93 kg)  12/27/17 201 lb 1.3 oz (91.2 kg)  10/05/17 205 lb (93 kg)    Physical Exam  Constitutional: He is oriented to person, place, and time. He appears well-developed. He is cooperative. No distress.  HENT:  Head: Normocephalic and atraumatic.  Eyes: EOM are normal.  Neck: Normal range of motion. Neck supple. No tracheal deviation present. No thyromegaly present.  Cardiovascular: Normal rate, S1 normal and S2 normal. Exam reveals no gallop.  No murmur heard. Pulses:      Dorsalis pedis pulses are 1+ on the right side, and 1+ on the left side.        Posterior tibial pulses are 1+ on the right side, and 1+ on the left side.  Pulmonary/Chest: Effort normal. No respiratory distress. He has no wheezes.  Abdominal: He exhibits no distension. There is no tenderness. There is no guarding and no CVA tenderness.  Musculoskeletal: He exhibits no edema or deformity.       Right shoulder: He exhibits no swelling and no deformity.  Neurological: He is alert and oriented to person, place, and time. He has normal strength and normal reflexes. No cranial nerve deficit or sensory deficit. Gait normal.  Skin: Skin is warm and dry. No rash noted. No cyanosis. Nails show no clubbing.  Psychiatric: He has a normal mood and affect. His speech is normal. Judgment normal. Cognition and memory are normal.     CMP ( most recent) CMP     Component Value Date/Time   NA 137 02/06/2018 0833   K 3.8 02/06/2018 0833   CL 100 02/06/2018 0833   CO2 29 02/06/2018 0833   GLUCOSE 148 (H) 02/06/2018 0833   BUN 16 02/06/2018 0833   CREATININE 1.13 02/06/2018 0833   CALCIUM 9.3 02/06/2018 0833   PROT 7.0 02/06/2018 0833   ALBUMIN 4.1 12/30/2016 0950   AST 24 02/06/2018 0833   ALT 32 02/06/2018 0833   ALKPHOS 95 12/30/2016 0950   BILITOT 0.7 02/06/2018 0833   GFRNONAA 72 02/06/2018 0833   GFRAA 83 02/06/2018 0833    Lab Results  Component Value Date   HGBA1C 7.4 (H) 02/06/2018   HGBA1C 7.5 (H) 09/25/2017   HGBA1C 9.1 (H) 04/05/2017    Lipid Panel     Component Value Date/Time   CHOL 194 07/19/2017 1014   TRIG 144 07/19/2017 1014   HDL 41 07/19/2017 1014   CHOLHDL 4.7 07/19/2017 1014   VLDL 64 (H) 12/30/2016 0950   LDLCALC 127 (H) 07/19/2017 1014     Assessment & Plan:   1. Diabetes mellitus type 2 in obese [facility])  - Patient has currently uncontrolled symptomatic type 2 DM since  57 years of age. - Mr. Helfman returns with A1c of 7.4%, unchanged from his last visit A1c was 7.5%, although generally improved from 14.1%.     His diabetes is  complicated by obesity , prior history of noncompliance/nonadherence, and patient remains at a high risk for more acute and chronic complications of diabetes which include CAD, CVA, CKD, retinopathy, and neuropathy. These are all discussed in detail with the patient.  - I have counseled the patient on diet management and weight loss, by adopting a carbohydrate restricted/protein rich diet.  -He admits to dietary indiscretions including consumption of sweetened beverages.  -  Suggestion is made  for him to avoid simple carbohydrates  from his diet including Cakes, Sweet Desserts / Pastries, Ice Cream, Soda (diet and regular), Sweet Tea, Candies, Chips, Cookies, Store Bought Juices, Alcohol in Excess of  1-2 drinks a day, Artificial Sweeteners, and "Sugar-free" Products. This will help patient to have stable blood glucose profile and potentially avoid unintended weight gain.   - I encouraged the patient to switch to  unprocessed or minimally processed complex starch and increased protein intake (animal or plant source), fruits, and vegetables.  - Patient is advised to stick to a routine mealtimes to eat 3 meals  a day and avoid unnecessary snacks ( to snack only to correct hypoglycemia).   - I have approached patient with the following individualized plan to manage diabetes and patient agrees:   -Based on his presentation with near target fasting glycemia, and A1c of 7.4%, he would not require bolus insulin at this time.    - I advised him to increase Lantus to 60 units daily at bedtime, associated with strict monitoring of blood glucose 2 times a day-daily before breakfast and at bedtime.   -Patient is encouraged to call clinic for blood glucose levels less than 70 or above 300 mg /dl.  - I advised him to be consistent in taking his metformin 500 mg p.o. twice daily-after breakfast and supper.   - Patient will be considered for incretin therapy as appropriate next visit. - Patient specific  target  A1c;  LDL, HDL, Triglycerides, and  Waist Circumference were discussed in detail.  2) BP/HTN: His blood pressure is controlled to target.    He is advised to continue his current blood pressure medications including lisinopril/hydrochlorthiazide  10/12.5 mg po daily.  3) Lipids/HPL: His recent lipid panel showed uncontrolled LDL at 127.  I have advised him to be consistent taking his simvastatin 40 mg p.o. nightly.    Side effects and precautions discussed with him.   4)  Weight/Diet: CDE Consult has been  initiated , exercise, and detailed carbohydrates information provided.  5) Chronic Care/Health Maintenance:  -Patient is on ACEI/ARB and Statin medications and encouraged to continue to follow up with Ophthalmology, Podiatrist at least yearly or according to recommendations, and advised to   stay away from smoking. I have recommended yearly flu vaccine and pneumonia vaccination at least every 5 years; moderate intensity exercise for up to 150 minutes weekly; and  sleep for at least 7 hours a day.  - I advised patient to maintain close follow up with Fayrene Helper, MD for primary care needs.  - Time spent with the patient: 25 min, of which >50% was spent in reviewing his blood glucose logs , discussing his hypo- and hyper-glycemic episodes, reviewing his current and  previous labs and insulin doses and developing a plan to avoid hypo- and hyper-glycemia. Please refer to Patient Instructions for Blood Glucose Monitoring and Insulin/Medications Dosing Guide"  in media tab for additional information. [name] participated in the discussions, expressed understanding, and voiced agreement with the above plans.  All questions were answered to his satisfaction. he is encouraged to contact clinic should he have any questions or concerns prior to his return visit.   Follow up plan: - Return in about 3 months (around 06/13/2018) for Meter, and Logs.  Glade Lloyd, MD Phone:  [phone]  Fax: [phone]   This note was partially dictated with voice recognition software. Similar sounding words can be transcribed inadequately or may not  be corrected upon  review.  03/14/2018, 1:06 PM

## 2018-03-19 ENCOUNTER — Ambulatory Visit: Payer: Medicare Other | Admitting: "Endocrinology

## 2018-04-02 ENCOUNTER — Encounter (HOSPITAL_COMMUNITY): Payer: Self-pay | Admitting: Psychiatry

## 2018-04-02 ENCOUNTER — Ambulatory Visit (INDEPENDENT_AMBULATORY_CARE_PROVIDER_SITE_OTHER): Payer: Medicare Other | Admitting: Psychiatry

## 2018-04-02 VITALS — BP 119/80 | HR 81 | Ht 68.0 in | Wt 202.0 lb

## 2018-04-02 DIAGNOSIS — F322 Major depressive disorder, single episode, severe without psychotic features: Secondary | ICD-10-CM | POA: Diagnosis not present

## 2018-04-02 MED ORDER — ALPRAZOLAM 1 MG PO TABS: 1.0000 mg | ORAL_TABLET | Freq: Every day | ORAL | 2 refills | Status: DC | PRN

## 2018-04-02 MED ORDER — TEMAZEPAM 30 MG PO CAPS: 30.0000 mg | ORAL_CAPSULE | Freq: Every day | ORAL | 2 refills | Status: DC

## 2018-04-02 MED ORDER — FLUOXETINE HCL 20 MG PO CAPS: 20.0000 mg | ORAL_CAPSULE | Freq: Every day | ORAL | 2 refills | Status: DC
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
## 2018-04-18 ENCOUNTER — Other Ambulatory Visit: Payer: Self-pay

## 2018-04-18 MED ORDER — ACCU-CHEK SOFTCLIX LANCETS MISC: 100.0000 | Freq: Four times a day (QID) | 5 refills | Status: DC

## 2018-04-26 ENCOUNTER — Other Ambulatory Visit: Payer: Self-pay

## 2018-04-26 MED ORDER — ACCU-CHEK SOFTCLIX LANCETS MISC: 100.0000 | Freq: Four times a day (QID) | 5 refills | Status: DC

## 2018-04-30 ENCOUNTER — Other Ambulatory Visit: Payer: Self-pay

## 2018-04-30 MED ORDER — ACCU-CHEK SOFTCLIX LANCETS MISC: 100.0000 | Freq: Four times a day (QID) | 5 refills | Status: DC

## 2018-05-20 ENCOUNTER — Other Ambulatory Visit: Payer: Self-pay | Admitting: "Endocrinology

## 2018-05-23 ENCOUNTER — Other Ambulatory Visit: Payer: Self-pay

## 2018-05-23 ENCOUNTER — Ambulatory Visit (INDEPENDENT_AMBULATORY_CARE_PROVIDER_SITE_OTHER): Payer: Medicare Other | Admitting: Family Medicine

## 2018-05-23 ENCOUNTER — Encounter: Payer: Self-pay | Admitting: Family Medicine

## 2018-05-23 VITALS — BP 128/80 | HR 80 | Resp 15 | Ht 68.0 in | Wt 206.1 lb

## 2018-05-23 DIAGNOSIS — Z23 Encounter for immunization: Secondary | ICD-10-CM | POA: Diagnosis not present

## 2018-05-23 DIAGNOSIS — Z0000 Encounter for general adult medical examination without abnormal findings: Secondary | ICD-10-CM

## 2018-05-23 NOTE — Progress Notes (Signed)
Preventive Screening-Counseling & Management   Patient present here today for a welcome to medicare visit   Current Problems (verified)   Medications Prior to Visit Allergies (verified)   PAST HISTORY  Family History (completed in history)   Social History Disabled for 2 years, married, lives with spouse, 1 child passed away, never a smoker, occasional alcohol use    Risk Factors  Current exercise habits: 3 x a week minimum, pushups and stretches for 30 minutes. Will be joining the [facility] [facility] for swimming   Dietary issues discussed: low fat diet and heart healthy encouraged    Cardiac risk factors: hyperlipidemia , type 2 diabetes   Depression Screen  (Note: if answer to either of the following is "Yes", a more complete depression screening is indicated)   Over the past two weeks, have you felt down, depressed or hopeless? No  Over the past two weeks, have you felt little interest or pleasure in doing things? yes Have you lost interest or pleasure in daily life? No  Do you often feel hopeless? Not really  Do you cry easily over simple problems? No   Activities of Daily Living  In your present state of health, do you have any difficulty performing the following activities?  Driving?: No Managing money?: No Feeding yourself?:No Getting from bed to chair?:No Climbing a flight of stairs?:No Preparing food and eating?:No Bathing or showering?:No Getting dressed?:No Getting to the toilet?:No Using the toilet?:No Moving around from place to place?: No  Fall Risk Assessment In the past year have you fallen or had a near fall?: once while fishing  Are you currently taking any medications that make you dizzy?:No   Hearing Difficulties: No Do you often ask people to speak up or repeat themselves?:No Do you experience ringing or noises in your ears?:No Do you have difficulty understanding soft or whispered voices?:No  Cognitive Testing  Alert? Yes Normal Appearance?Yes   Oriented to person? Yes Place? Yes  Time? Yes  Displays appropriate judgment?Yes  Can read the correct time from a watch face? yes Are you having problems remembering things? no  Advanced Directives have been discussed with the patient?Yes, packet given on advanced directives    List the Names of Other Physician/Practitioners you currently use:  Dr Fransico HimNida (endo)  Dr. Tenny Crawoss (psych)    Indicate any recent Medical Services you may have received from other than [facility] providers in the past year (date may be approximate).     Medicare Attestation  I have personally reviewed:  The patient's medical and social history  Their use of alcohol, tobacco or illicit drugs  Their current medications and supplements  The patient's functional ability including ADLs,fall risks, home safety risks, cognitive, and hearing and visual impairment  Diet and physical activities  Evidence for depression or mood disorders  The patient's weight, height, BMI, and visual acuity have been recorded in the chart. I have made referrals, counseling, and provided education to the patient based on review of the above and I have provided the patient with a written personalized care plan for preventive services.    Physical Exam BP 128/80   Pulse 80   Resp 15   Ht 5\' 8"  (1.727 m)   Wt 206 lb 1.9 oz (93.5 kg)   SpO2 97%   BMI 31.34 kg/m    EKG: NSR, rate 72 and  Regular, no ischemia, no LVH   Assessment & Plan:  Need for immunization against influenza After obtaining informed consent, the vaccine  is  administered    Welcome to [employer] preventive visit Annual exam as documented. Counseling done  re healthy lifestyle involving commitment to 150 minutes exercise per week, heart healthy diet, and attaining healthy weight.The importance of adequate sleep also discussed. Regular seat belt use and home safety, is also discussed. Changes in health habits are decided on by the patient with goals and time frames  set  for achieving them. Immunization and cancer screening needs are specifically addressed at this visit.

## 2018-05-23 NOTE — Assessment & Plan Note (Signed)

## 2018-05-23 NOTE — Patient Instructions (Signed)
F/u in early March , call if you need me sooner  Flu vaccine today  It is important that you exercise regularly at least 30 minutes 5 times a week. If you develop chest pain, have severe difficulty breathing, or feel very tired, stop exercising immediately and seek medical attention  Thanks for choosing [facility], we consider it a privelige to serve you.

## 2018-05-23 NOTE — Assessment & Plan Note (Signed)
After obtaining informed consent, the vaccine is  administered 

## 2018-05-27 ENCOUNTER — Encounter: Payer: Self-pay | Admitting: Family Medicine

## 2018-06-12 DIAGNOSIS — E782 Mixed hyperlipidemia: Secondary | ICD-10-CM | POA: Diagnosis not present

## 2018-06-12 DIAGNOSIS — E1169 Type 2 diabetes mellitus with other specified complication: Secondary | ICD-10-CM | POA: Diagnosis not present

## 2018-06-12 DIAGNOSIS — E669 Obesity, unspecified: Secondary | ICD-10-CM | POA: Diagnosis not present

## 2018-06-13 LAB — COMPLETE METABOLIC PANEL WITH GFR
AG RATIO: 1.4 (calc) (ref 1.0–2.5)
ALBUMIN MSPROF: 4.2 g/dL (ref 3.6–5.1)
ALT: 21 U/L (ref 9–46)
AST: 13 U/L (ref 10–35)
Alkaline phosphatase (APISO): 87 U/L (ref 40–115)
BILIRUBIN TOTAL: 0.7 mg/dL (ref 0.2–1.2)
BUN: 15 mg/dL (ref 7–25)
CHLORIDE: 100 mmol/L (ref 98–110)
CO2: 30 mmol/L (ref 20–32)
Calcium: 9.4 mg/dL (ref 8.6–10.3)
Creat: 1.24 mg/dL (ref 0.70–1.33)
GFR, Est African American: 74 mL/min/{1.73_m2} (ref >=60)
GFR, Est Non African American: 64 mL/min/{1.73_m2} (ref >=60)
GLOBULIN: 2.9 g/dL (ref 1.9–3.7)
Glucose, Bld: 156 mg/dL — ABNORMAL HIGH (ref 65–99)
POTASSIUM: 4.3 mmol/L (ref 3.5–5.3)
SODIUM: 138 mmol/L (ref 135–146)
Total Protein: 7.1 g/dL (ref 6.1–8.1)

## 2018-06-13 LAB — LIPID PANEL
CHOL/HDL RATIO: 3.7 (calc) (ref <5.0)
CHOLESTEROL: 160 mg/dL (ref <200)
HDL: 43 mg/dL (ref >40)
LDL Cholesterol (Calc): 95 mg/dL (calc)
Non-HDL Cholesterol (Calc): 117 mg/dL (calc) (ref <130)
Triglycerides: 121 mg/dL (ref <150)

## 2018-06-13 LAB — HEMOGLOBIN A1C
EAG (MMOL/L): 9.8 (calc)
HEMOGLOBIN A1C: 7.8 %{Hb} — AB (ref <5.7)
MEAN PLASMA GLUCOSE: 177 (calc)

## 2018-06-19 ENCOUNTER — Ambulatory Visit (INDEPENDENT_AMBULATORY_CARE_PROVIDER_SITE_OTHER): Payer: Medicare Other | Admitting: "Endocrinology

## 2018-06-19 ENCOUNTER — Encounter: Payer: Self-pay | Admitting: "Endocrinology

## 2018-06-19 VITALS — BP 128/74 | HR 74 | Ht 68.0 in | Wt 209.0 lb

## 2018-06-19 DIAGNOSIS — E1169 Type 2 diabetes mellitus with other specified complication: Secondary | ICD-10-CM

## 2018-06-19 DIAGNOSIS — I10 Essential (primary) hypertension: Secondary | ICD-10-CM | POA: Diagnosis not present

## 2018-06-19 DIAGNOSIS — E782 Mixed hyperlipidemia: Secondary | ICD-10-CM | POA: Diagnosis not present

## 2018-06-19 DIAGNOSIS — E669 Obesity, unspecified: Secondary | ICD-10-CM | POA: Diagnosis not present

## 2018-06-19 MED ORDER — INSULIN GLARGINE 100 UNIT/ML SOLOSTAR PEN: 70.0000 [IU] | PEN_INJECTOR | Freq: Every day | SUBCUTANEOUS | 2 refills | Status: DC

## 2018-06-19 NOTE — Progress Notes (Signed)
Endocrinology follow-up note   Subjective:    Patient ID: John [name], male    DOB: 1961-05-07. Patient is being seen in f/u  for management of chronically uncontrolled type 2 diabetes, hyperlipidemia, hypertension. PMD:   Fayrene Helper, MD  Past Medical History:  Diagnosis Date  . Anxiety   . Depression 2007  . Diabetes mellitus without complication ([place])   . Hyperlipidemia   . Hypertension   . Hypogonadism male    Past Surgical History:  Procedure Laterality Date  . COLONOSCOPY  01/20/2011   Procedure: COLONOSCOPY;  Surgeon: Dorothyann Peng, MD;  Location: [facility];  Service: Endoscopy;  Laterality: N/A;  . HERNIA REPAIR     ventral hernia repair    Social History   Socioeconomic History  . Marital status: Married    Spouse name: Not on file  . Number of children: 1  . Years of education: Not on file  . Highest education level: Not on file  Occupational History  . Occupation: Financial risk analyst  . Financial resource strain: Not on file  . Food insecurity:    Worry: Not on file    Inability: Not on file  . Transportation needs:    Medical: Not on file    Non-medical: Not on file  Tobacco Use  . Smoking status: Never Smoker  . Smokeless tobacco: Never Used  Substance and Sexual Activity  . Alcohol use: Yes    Alcohol/week: 1.0 standard drinks    Types: 1 Cans of beer per week    Comment: occasional beer socially.   . Drug use: No  . Sexual activity: Yes  Lifestyle  . Physical activity:    Days per week: Not on file    Minutes per session: Not on file  . Stress: Not on file  Relationships  . Social connections:    Talks on phone: Not on file    Gets together: Not on file    Attends religious service: Not on file    Active member of club or organization: Not on file    Attends meetings of clubs or organizations: Not on file    Relationship status: Not on file  Other Topics Concern  . Not on file  Social History Narrative  . Not  on file   Outpatient Encounter Medications as of 06/19/2018  Medication Sig  . ACCU-CHEK SOFTCLIX LANCETS lancets 100 each by Other route 4 (four) times daily. Use as instructed  . ALPRAZolam (XANAX) 1 MG tablet Take 1 tablet (1 mg total) by mouth daily as needed for anxiety.  [name] atorvastatin (LIPITOR) 20 MG tablet TAKE 1 TABLET BY MOUTH ONCE DAILY  . blood glucose meter kit and supplies KIT Dispense based on patient and insurance preference. Use up to four times daily as directed. (FOR ICD-9 250.00, 250.01).  . Blood Glucose Monitoring Suppl (ACCU-CHEK AVIVA) device 1 each by Other route 2 (two) times daily. Use as instructed bid. E11.65  . FLUoxetine (PROZAC) 20 MG capsule Take 1 capsule (20 mg total) by mouth daily.  [name] glucose blood (ACCU-CHEK AVIVA) test strip Use to test blood glucose 4 times a day. E11.65  . Insulin Glargine (LANTUS SOLOSTAR) 100 UNIT/ML Solostar Pen Inject 70 Units into the skin at bedtime.  [name] lisinopril-hydrochlorothiazide (PRINZIDE,ZESTORETIC) 10-12.5 MG tablet Take 1 tablet by mouth daily.  . metFORMIN (GLUCOPHAGE) 500 MG tablet Take 1 tablet (500 mg total) by mouth 2 (two) times daily with a meal.  .  temazepam (RESTORIL) 30 MG capsule Take 1 capsule (30 mg total) by mouth at bedtime.  . [DISCONTINUED] Insulin Glargine (LANTUS SOLOSTAR) 100 UNIT/ML Solostar Pen Inject 60 Units into the skin at bedtime.   No facility-administered encounter medications on file as of 06/19/2018.    ALLERGIES: Allergies  Allergen Reactions  . Januvia [Sitagliptin] Nausea And Vomiting  . Metformin And Related Nausea Only  . Poison Oak Extract [Poison Oak Extract] Dermatitis   VACCINATION STATUS: Immunization History  Administered Date(s) Administered  . Influenza Whole 03/18/2010, 04/13/2011  . Influenza,inj,Quad PF,6+ Mos 05/20/2013, 05/08/2014, 06/11/2015, 04/18/2017, 05/23/2018  . Pneumococcal Polysaccharide-23 11/26/2013  . Td 09/15/2009    Diabetes  He presents for his  follow-up diabetic visit. He has type 2 diabetes mellitus. Onset time: He was diagnosed at age 57. His disease course has been worsening. There are no hypoglycemic associated symptoms. Pertinent negatives for hypoglycemia include no headaches, seizures or tremors. Pertinent negatives for diabetes include no blurred vision, no chest pain, no polydipsia and no polyuria. There are no hypoglycemic complications. Symptoms are worsening. There are no diabetic complications. Risk factors for coronary artery disease include dyslipidemia, diabetes mellitus, hypertension, male sex, obesity and sedentary lifestyle. Current diabetic treatment includes oral agent (monotherapy). His weight is increasing steadily. He is following a generally unhealthy diet. He has not had a previous visit with a dietitian. He never participates in exercise. His breakfast blood glucose range is generally 140-180 mg/dl. His overall blood glucose range is 140-180 mg/dl. (He returns with improved glycemic profile, A1c of 7.8%, overall improving from 14.1%.   ) An ACE inhibitor/angiotensin II receptor blocker is being taken. Eye exam is current.  Hyperlipidemia  This is a chronic problem. The current episode started more than 1 year ago. The problem is uncontrolled. Exacerbating diseases include diabetes and obesity. Pertinent negatives include no chest pain, myalgias or shortness of breath. Current antihyperlipidemic treatment includes statins. Risk factors for coronary artery disease include diabetes mellitus, hypertension, male sex, obesity, a sedentary lifestyle and dyslipidemia.  Hypertension  This is a chronic problem. The current episode started more than 1 year ago. The problem is controlled. Pertinent negatives include no blurred vision, chest pain, headaches, palpitations or shortness of breath. Risk factors for coronary artery disease include dyslipidemia, diabetes mellitus, male gender, obesity and sedentary lifestyle. Past treatments  include ACE inhibitors and diuretics.    Review of Systems  Constitutional: Negative for chills and fever.  Eyes: Positive for visual disturbance. Negative for blurred vision.  Respiratory: Negative for cough and shortness of breath.   Cardiovascular: Negative for chest pain and palpitations.       No Shortness of breath  Gastrointestinal: Negative for abdominal pain, diarrhea, nausea and vomiting.  Endocrine: Negative for polydipsia and polyuria.  Genitourinary: Negative for frequency, hematuria and urgency.  Musculoskeletal: Negative for myalgias.  Skin: Negative for rash.  Neurological: Negative for tremors, seizures and headaches.  Hematological: Does not bruise/bleed easily.  Psychiatric/Behavioral: Negative for hallucinations and suicidal ideas.    Objective:    BP 128/74   Pulse 74   Ht '5\' 8"'  (1.727 m)   Wt 209 lb (94.8 kg)   BMI 31.78 kg/m   Wt Readings from Last 3 Encounters:  06/19/18 209 lb (94.8 kg)  05/23/18 206 lb 1.9 oz (93.5 kg)  03/14/18 205 lb (93 kg)    Physical Exam  Constitutional: He is oriented to person, place, and time. He appears well-developed. He is cooperative. No distress.  HENT:  Head: Normocephalic and atraumatic.  Eyes: EOM are normal.  Neck: Normal range of motion. Neck supple. No tracheal deviation present. No thyromegaly present.  Cardiovascular: Normal rate, S1 normal and S2 normal. Exam reveals no gallop.  No murmur heard. Pulses:      Dorsalis pedis pulses are 1+ on the right side and 1+ on the left side.       Posterior tibial pulses are 1+ on the right side and 1+ on the left side.  Pulmonary/Chest: Effort normal. No respiratory distress. He has no wheezes.  Abdominal: He exhibits no distension. There is no abdominal tenderness. There is no guarding and no CVA tenderness.  Musculoskeletal:        General: No deformity or edema.     Right shoulder: He exhibits no swelling and no deformity.  Neurological: He is alert and oriented  to person, place, and time. He has normal strength and normal reflexes. No cranial nerve deficit or sensory deficit. Gait normal.  Skin: Skin is warm and dry. No rash noted. No cyanosis. Nails show no clubbing.  Psychiatric: He has a normal mood and affect. His speech is normal. Judgment normal. Cognition and memory are normal.     CMP     Component Value Date/Time   NA 138 06/12/2018 0848   K 4.3 06/12/2018 0848   CL 100 06/12/2018 0848   CO2 30 06/12/2018 0848   GLUCOSE 156 (H) 06/12/2018 0848   BUN 15 06/12/2018 0848   CREATININE 1.24 06/12/2018 0848   CALCIUM 9.4 06/12/2018 0848   PROT 7.1 06/12/2018 0848   ALBUMIN 4.1 12/30/2016 0950   AST 13 06/12/2018 0848   ALT 21 06/12/2018 0848   ALKPHOS 95 12/30/2016 0950   BILITOT 0.7 06/12/2018 0848   GFRNONAA 64 06/12/2018 0848   GFRAA 74 06/12/2018 0848    Lab Results  Component Value Date   HGBA1C 7.8 (H) 06/12/2018   HGBA1C 7.4 (H) 02/06/2018   HGBA1C 7.5 (H) 09/25/2017    Lipid Panel     Component Value Date/Time   CHOL 160 06/12/2018 0848   TRIG 121 06/12/2018 0848   HDL 43 06/12/2018 0848   CHOLHDL 3.7 06/12/2018 0848   VLDL 64 (H) 12/30/2016 0950   LDLCALC 95 06/12/2018 0848     Assessment & Plan:   1. Diabetes mellitus type 2 in obese [facility])  - Patient has currently uncontrolled symptomatic type 2 DM since  57 years of age. - John [name] returns with A1c of 7.8%,  generally improved from 14.1%.     His diabetes is complicated by obesity , prior history of noncompliance/nonadherence, and patient remains at a high risk for more acute and chronic complications of diabetes which include CAD, CVA, CKD, retinopathy, and neuropathy. These are all discussed in detail with the patient.  - I have counseled the patient on diet management and weight loss, by adopting a carbohydrate restricted/protein rich diet.  -He admits to dietary indiscretions including consumption of sweetened beverages.  -  Suggestion is made  for him to avoid simple carbohydrates  from his diet including Cakes, Sweet Desserts / Pastries, Ice Cream, Soda (diet and regular), Sweet Tea, Candies, Chips, Cookies, Store Bought Juices, Alcohol in Excess of  1-2 drinks a day, Artificial Sweeteners, and "Sugar-free" Products. This will help patient to have stable blood glucose profile and potentially avoid unintended weight gain.  - I encouraged the patient to switch to  unprocessed or minimally processed complex starch and increased protein  intake (animal or plant source), fruits, and vegetables.  - Patient is advised to stick to a routine mealtimes to eat 3 meals  a day and avoid unnecessary snacks ( to snack only to correct hypoglycemia).   - I have approached patient with the following individualized plan to manage diabetes and patient agrees:   -Based on his presentation with near target fasting glycemia, and A1c of 7.8%, he would not require bolus insulin at this time.    -He is advised to increase Lantus to 70 units nightly,  associated with strict monitoring of blood glucose 2 times a day-daily before breakfast and at bedtime.  -Patient is encouraged to call clinic for blood glucose levels less than 70 or above 300 mg /dl.  -He will continue metformin  500 mg p.o. twice daily-after breakfast and supper.   - Patient will be considered for incretin therapy as appropriate next visit. - Patient specific target  A1c;  LDL, HDL, Triglycerides, and  Waist Circumference were discussed in detail.  2) BP/HTN: His blood pressure is controlled to target.    He is advised to continue his current blood pressure medications including lisinopril/hydrochlorthiazide  10/12.5 mg po daily.  3) Lipids/HPL: His recent lipid panel showed improved LDL to 95.  He is advised to continue simvastatin 40 mg p.o. nightly.    Side effects and precautions discussed with him.   4)  Weight/Diet: CDE Consult has been  initiated , exercise, and detailed  carbohydrates information provided.  5) Chronic Care/Health Maintenance:  -Patient is on ACEI/ARB and Statin medications and encouraged to continue to follow up with Ophthalmology, Podiatrist at least yearly or according to recommendations, and advised to   stay away from smoking. I have recommended yearly flu vaccine and pneumonia vaccination at least every 5 years; moderate intensity exercise for up to 150 minutes weekly; and  sleep for at least 7 hours a day.  - I advised patient to maintain close follow up with Fayrene Helper, MD for primary care needs.  - Time spent with the patient: 25 min, of which >50% was spent in reviewing his blood glucose logs , discussing his hypo- and hyper-glycemic episodes, reviewing his current and  previous labs and insulin doses and developing a plan to avoid hypo- and hyper-glycemia. Please refer to Patient Instructions for Blood Glucose Monitoring and Insulin/Medications Dosing Guide"  in media tab for additional information. [name] participated in the discussions, expressed understanding, and voiced agreement with the above plans.  All questions were answered to his satisfaction. he is encouraged to contact clinic should he have any questions or concerns prior to his return visit.   Follow up plan: - Return in about 4 months (around 10/18/2018) for Meter, and Logs.  Glade Lloyd, MD Phone: [phone]  Fax: [phone]   This note was partially dictated with voice recognition software. Similar sounding words can be transcribed inadequately or may not  be corrected upon review.  06/19/2018, 9:59 AM

## 2018-06-19 NOTE — Patient Instructions (Signed)

## 2018-07-03 ENCOUNTER — Encounter (HOSPITAL_COMMUNITY): Payer: Self-pay | Admitting: Psychiatry

## 2018-07-03 ENCOUNTER — Ambulatory Visit (INDEPENDENT_AMBULATORY_CARE_PROVIDER_SITE_OTHER): Payer: Medicare Other | Admitting: Psychiatry

## 2018-07-03 VITALS — BP 148/96 | HR 80 | Ht 68.0 in | Wt 201.2 lb

## 2018-07-03 DIAGNOSIS — F322 Major depressive disorder, single episode, severe without psychotic features: Secondary | ICD-10-CM | POA: Diagnosis not present

## 2018-07-03 MED ORDER — TEMAZEPAM 30 MG PO CAPS: 30.0000 mg | ORAL_CAPSULE | Freq: Every day | ORAL | 2 refills | Status: DC

## 2018-07-03 MED ORDER — ALPRAZOLAM 1 MG PO TABS: 1.0000 mg | ORAL_TABLET | Freq: Every day | ORAL | 2 refills | Status: DC | PRN

## 2018-07-03 MED ORDER — FLUOXETINE HCL 20 MG PO CAPS: 20.0000 mg | ORAL_CAPSULE | Freq: Every day | ORAL | 2 refills | Status: DC
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
## 2018-08-02 ENCOUNTER — Other Ambulatory Visit: Payer: Self-pay | Admitting: "Endocrinology

## 2018-08-06 ENCOUNTER — Ambulatory Visit: Payer: Self-pay | Admitting: Family Medicine

## 2018-08-22 ENCOUNTER — Other Ambulatory Visit: Payer: Self-pay | Admitting: "Endocrinology

## 2018-08-23 ENCOUNTER — Ambulatory Visit: Payer: Self-pay | Admitting: Family Medicine

## 2018-08-23 ENCOUNTER — Other Ambulatory Visit (HOSPITAL_COMMUNITY): Payer: Self-pay | Admitting: Psychiatry

## 2018-09-23 ENCOUNTER — Other Ambulatory Visit: Payer: Self-pay | Admitting: "Endocrinology

## 2018-10-02 ENCOUNTER — Ambulatory Visit (HOSPITAL_COMMUNITY): Payer: Medicare Other | Admitting: Psychiatry

## 2018-10-02 ENCOUNTER — Other Ambulatory Visit: Payer: Self-pay

## 2018-10-10 ENCOUNTER — Ambulatory Visit (INDEPENDENT_AMBULATORY_CARE_PROVIDER_SITE_OTHER): Payer: Medicare Other | Admitting: Psychiatry

## 2018-10-10 ENCOUNTER — Other Ambulatory Visit: Payer: Self-pay

## 2018-10-10 ENCOUNTER — Encounter (HOSPITAL_COMMUNITY): Payer: Self-pay | Admitting: Psychiatry

## 2018-10-10 DIAGNOSIS — F322 Major depressive disorder, single episode, severe without psychotic features: Secondary | ICD-10-CM

## 2018-10-10 MED ORDER — ALPRAZOLAM 1 MG PO TABS: 1.0000 mg | ORAL_TABLET | Freq: Every day | ORAL | 2 refills | Status: DC | PRN

## 2018-10-10 MED ORDER — FLUOXETINE HCL 20 MG PO CAPS: 20.0000 mg | ORAL_CAPSULE | Freq: Every day | ORAL | 2 refills | Status: DC

## 2018-10-10 MED ORDER — TEMAZEPAM 30 MG PO CAPS: 30.0000 mg | ORAL_CAPSULE | Freq: Every day | ORAL | 2 refills | Status: DC
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
## 2018-10-16 ENCOUNTER — Ambulatory Visit (INDEPENDENT_AMBULATORY_CARE_PROVIDER_SITE_OTHER): Payer: Medicare Other | Admitting: Family Medicine

## 2018-10-16 ENCOUNTER — Encounter: Payer: Self-pay | Admitting: Family Medicine

## 2018-10-16 ENCOUNTER — Other Ambulatory Visit: Payer: Self-pay

## 2018-10-16 ENCOUNTER — Encounter: Payer: Self-pay | Admitting: "Endocrinology

## 2018-10-16 ENCOUNTER — Ambulatory Visit (INDEPENDENT_AMBULATORY_CARE_PROVIDER_SITE_OTHER): Payer: Medicare Other | Admitting: "Endocrinology

## 2018-10-16 VITALS — Ht 68.0 in | Wt 203.0 lb

## 2018-10-16 DIAGNOSIS — E669 Obesity, unspecified: Secondary | ICD-10-CM

## 2018-10-16 DIAGNOSIS — E782 Mixed hyperlipidemia: Secondary | ICD-10-CM | POA: Diagnosis not present

## 2018-10-16 DIAGNOSIS — E1169 Type 2 diabetes mellitus with other specified complication: Secondary | ICD-10-CM | POA: Diagnosis not present

## 2018-10-16 DIAGNOSIS — I10 Essential (primary) hypertension: Secondary | ICD-10-CM | POA: Diagnosis not present

## 2018-10-16 NOTE — Patient Instructions (Addendum)
Thank you for your visit today via the telephone. I appreciate the opportunity to provide you with the care for your health and wellness. Today we discussed: Overall health.  Glad to hear that you are doing well.  Please continue to take your medications as prescribed.  Follow-up will be with Dr. Lodema Hong in about 5 months.  If you need anything in the interim please do not hesitate to reach out to [place].  Please continue to work on your diet and maintain a heart healthy low-fat low-cholesterol, diabetic diet.  You are encouraged to try to get 30 minutes of exercise on a daily basis at least 5 days of the week.  Walking is a really good option for this.  Labs were placed at this visit.  If you would get them in the next week that would be great.  If you are hesitant about coming out secondary to the virus this is understandable you can get them 1 week prior to your appointment with Dr. Lodema Hong.  Dr. Tenny Craw has called in your sleep and anxiety medications.  Please continue to practice social distancing.  WASH YOUR HANDS WELL AND FREQUENTLY. AVOID TOUCHING YOUR FACE, UNLESS YOUR HANDS ARE FRESHLY WASHED.  GET FRESH AIR DAILY. STAY HYDRATED WITH WATER.   It was a pleasure to see you and I look forward to continuing to work together on your health and well-being.Please do not hesitate to call the office if you need care or have questions about your care.  Have a wonderful day and week. With [facility], Tereasa Coop, DNP, AGNP-BC  DIET info  Hyperlipidemia is excess lipid (FATS) levels in the blood.  The excess lipids in your life places you at higher risk for developing heart disease and heart attacks.  This elevation of lipids or cholesterol weeks and plaque formation in the walls of your major arteries in the body be higher the level of LDL or your bad cholesterol the greater the chance of getting heart disease.  On the other hand the higher the level of the HDL or good cholesterol the lower  the risk for heart disease.  Lowering your risk for heart disease involves the following: Diet changes to lower your bad cholesterol and raise your good cholesterol.  Losing weight.  Start by losing 5 to 10 pounds.  Start or increase your physical activity.  Walking is a good exercise to get active.  Regular exercise such as briskly walking for 30 minutes 3 times a week increases your good cholesterol, lowers blood sugar, and promote weight loss and heart health.  Additionally, it is recommended by the American Heart Association and [employer] of Cardiologist that a degree of goal exercise at least 150 minutes/week accumulative for moderate intensity is brisk walking.  This can be broken up into various durations throughout the week.  For example 30 minutes on lunch break.  15 minutes in the morning.  15 minutes in the evening.  As long as the cumulative total is 150 minutes.  Other ways to modify your risk factors for heart disease include stop smoking it is never too late to stop smoking.  Control your blood pressure.  Can do this by maintaining your medications and taking them as directed.  Taking her medications as directed.  If you are diabetic, it is important that you control your blood sugar levels.  Diet: Follow the dietary approaches to stop hypertension-DASH DIET and to lower-fat for your cholesterol.   A.  Decreased total  fat calories and cholesterol.  B.  Decreased total saturated fats, and replaced with monounsaturated fats such as canola oil all of oil or margarine.  C.  Increase fiber with oatmeal, bran, fiber supplements. D.  Increase daily intake of fruits and vegetables.  Be mindful that canned fruits and canned vegetables could have   added sugar and salt.  E.  Tried garlic, soy protein and vitamin C to help lower your LDL cholesterol.  Limit red meats, egg yolks, cheese and creamy items such as ice cream, creamy sauces, soups and dressings, substituting instead with  oil-based salad dressings, broth-based soups, etc.  Avoid fatty/greasy foods and sweets.  Medications: You have been prescribed medications to help lower your cholesterol levels.  Please take these as directed.  We will be monitoring your cholesterol levels with labs yearly.  Please notify the office if you have: Chest pain Shortness of breath or trouble breathing or exercising Abdominal pain Muscle pain or weakness

## 2018-10-16 NOTE — Progress Notes (Signed)
Endocrinology Telehealth Visit Follow up Note -During COVID -19 Pandemic  This visit type was conducted due to national recommendations for restrictions regarding the COVID-19 Pandemic  in an effort to limit this patient's exposure and mitigate transmission of the corona virus.  Due to his co-morbid illnesses, John [name] is at  moderate to high risk for complications without adequate follow up.  This format is felt to be most appropriate for him at this time.  I connected with this patient on 10/16/2018   by telephone and verified that I am speaking with the correct person using two identifiers. John [name], 1961-02-18. he has verbally consented to this visit. All issues noted in this document were discussed and addressed. The format was not optimal for physical exam.   Subjective:    Patient ID: John [name], male    DOB: 1960/07/17. Patient is being engaged in telehealth in follow-up for management of chronically uncontrolled type 2 diabetes, hyperlipidemia, hypertension. PMD:   Fayrene Helper, MD  Past Medical History:  Diagnosis Date  . Anxiety   . Depression 2007  . Diabetes mellitus without complication ([place])   . Hyperlipidemia   . Hypertension   . Hypogonadism male    Past Surgical History:  Procedure Laterality Date  . COLONOSCOPY  01/20/2011   Procedure: COLONOSCOPY;  Surgeon: Dorothyann Peng, MD;  Location: [facility];  Service: Endoscopy;  Laterality: N/A;  . HERNIA REPAIR     ventral hernia repair    Social History   Socioeconomic History  . Marital status: Married    Spouse name: Not on file  . Number of children: 1  . Years of education: Not on file  . Highest education level: Not on file  Occupational History  . Occupation: Financial risk analyst  . Financial resource strain: Not on file  . Food insecurity:    Worry: Not on file    Inability: Not on file  . Transportation needs:     Medical: Not on file    Non-medical: Not on file  Tobacco Use  . Smoking status: Never Smoker  . Smokeless tobacco: Never Used  Substance and Sexual Activity  . Alcohol use: Yes    Alcohol/week: 1.0 standard drinks    Types: 1 Cans of beer per week    Comment: occasional beer socially.   . Drug use: No  . Sexual activity: Yes  Lifestyle  . Physical activity:    Days per week: Not on file    Minutes per session: Not on file  . Stress: Not on file  Relationships  . Social connections:    Talks on phone: Not on file    Gets together: Not on file    Attends religious service: Not on file    Active member of club or organization: Not on file    Attends meetings of clubs or organizations: Not on file    Relationship status: Not on file  Other Topics Concern  . Not on file  Social History Narrative  . Not on file   Outpatient Encounter Medications as of 10/16/2018  Medication Sig  . ACCU-CHEK SOFTCLIX LANCETS lancets 100 each by Other  route 4 (four) times daily. Use as instructed  . ALPRAZolam (XANAX) 1 MG tablet Take 1 tablet (1 mg total) by mouth daily as needed for anxiety.  [name] atorvastatin (LIPITOR) 20 MG tablet TAKE 1 TABLET BY MOUTH ONCE DAILY  . blood glucose meter kit and supplies KIT Dispense based on patient and insurance preference. Use up to four times daily as directed. (FOR ICD-9 250.00, 250.01).  . Blood Glucose Monitoring Suppl (ACCU-CHEK AVIVA) device 1 each by Other route 2 (two) times daily. Use as instructed bid. E11.65  . FLUoxetine (PROZAC) 20 MG capsule Take 1 capsule (20 mg total) by mouth daily.  [name] glucose blood (ACCU-CHEK AVIVA) test strip Use to test blood glucose 4 times a day. E11.65  . LANTUS SOLOSTAR 100 UNIT/ML Solostar Pen INJECT 70 UNITS SUBCUTANEOUSLY AT BEDTIME  . lisinopril-hydrochlorothiazide (PRINZIDE,ZESTORETIC) 10-12.5 MG tablet Take 1 tablet by mouth once daily  . metFORMIN (GLUCOPHAGE) 500 MG tablet TAKE 1 TABLET BY MOUTH TWICE DAILY WITH A  MEAL  . temazepam (RESTORIL) 30 MG capsule Take 1 capsule (30 mg total) by mouth at bedtime.   No facility-administered encounter medications on file as of 10/16/2018.    ALLERGIES: Allergies  Allergen Reactions  . Januvia [Sitagliptin] Nausea And Vomiting  . Metformin And Related Nausea Only  . Poison Oak Extract [Poison Oak Extract] Dermatitis   VACCINATION STATUS: Immunization History  Administered Date(s) Administered  . Influenza Whole 03/18/2010, 04/13/2011  . Influenza,inj,Quad PF,6+ Mos 05/20/2013, 05/08/2014, 06/11/2015, 04/18/2017, 05/23/2018  . Pneumococcal Polysaccharide-23 11/26/2013  . Td 09/15/2009    Diabetes  He presents for his follow-up diabetic visit. He has type 2 diabetes mellitus. Onset time: He was diagnosed at age 58. His disease course has been improving. There are no hypoglycemic associated symptoms. Pertinent negatives for hypoglycemia include no headaches, seizures or tremors. Pertinent negatives for diabetes include no blurred vision, no chest pain, no polydipsia and no polyuria. There are no hypoglycemic complications. Symptoms are improving. There are no diabetic complications. Risk factors for coronary artery disease include dyslipidemia, diabetes mellitus, hypertension, male sex, obesity and sedentary lifestyle. Current diabetic treatment includes oral agent (monotherapy). His weight is stable. He is following a generally unhealthy diet. He has not had a previous visit with a dietitian. He never participates in exercise. His breakfast blood glucose range is generally 140-180 mg/dl. His overall blood glucose range is 140-180 mg/dl. (His A1c prior to his last visit was 7.8% improving from 14.1%.  His recent labs are not ready  to review. ) An ACE inhibitor/angiotensin II receptor blocker is being taken. Eye exam is current.  Hyperlipidemia  This is a chronic problem. The current episode started more than 1 year ago. The problem is uncontrolled. Exacerbating  diseases include diabetes and obesity. Pertinent negatives include no chest pain, myalgias or shortness of breath. Current antihyperlipidemic treatment includes statins. Risk factors for coronary artery disease include diabetes mellitus, hypertension, male sex, obesity, a sedentary lifestyle and dyslipidemia.  Hypertension  This is a chronic problem. The current episode started more than 1 year ago. The problem is controlled. Pertinent negatives include no blurred vision, chest pain, headaches, palpitations or shortness of breath. Risk factors for coronary artery disease include dyslipidemia, diabetes mellitus, male gender, obesity and sedentary lifestyle. Past treatments include ACE inhibitors and diuretics.   Review of systems: Limited as above.   Objective:    There were no vitals taken for this visit.  Wt Readings from Last 3 Encounters:  10/16/18 203 lb (  92.1 kg)  06/19/18 209 lb (94.8 kg)  05/23/18 206 lb 1.9 oz (93.5 kg)      CMP     Component Value Date/Time   NA 138 06/12/2018 0848   K 4.3 06/12/2018 0848   CL 100 06/12/2018 0848   CO2 30 06/12/2018 0848   GLUCOSE 156 (H) 06/12/2018 0848   BUN 15 06/12/2018 0848   CREATININE 1.24 06/12/2018 0848   CALCIUM 9.4 06/12/2018 0848   PROT 7.1 06/12/2018 0848   ALBUMIN 4.1 12/30/2016 0950   AST 13 06/12/2018 0848   ALT 21 06/12/2018 0848   ALKPHOS 95 12/30/2016 0950   BILITOT 0.7 06/12/2018 0848   GFRNONAA 64 06/12/2018 0848   GFRAA 74 06/12/2018 0848    Lab Results  Component Value Date   HGBA1C 7.8 (H) 06/12/2018   HGBA1C 7.4 (H) 02/06/2018   HGBA1C 7.5 (H) 09/25/2017    Lipid Panel     Component Value Date/Time   CHOL 160 06/12/2018 0848   TRIG 121 06/12/2018 0848   HDL 43 06/12/2018 0848   CHOLHDL 3.7 06/12/2018 0848   VLDL 64 (H) 12/30/2016 0950   LDLCALC 95 06/12/2018 0848     Assessment & Plan:   1. Diabetes mellitus type 2 in obese [facility])  - Patient has currently uncontrolled symptomatic type 2 DM  since  58 years of age. -He reports near target glycemic profile, his previsit A1c is not ready to review.    His diabetes is complicated by obesity , prior history of noncompliance/nonadherence, and patient remains at a high risk for more acute and chronic complications of diabetes which include CAD, CVA, CKD, retinopathy, and neuropathy. These are all discussed in detail with the patient.  - I have counseled the patient on diet management and weight loss, by adopting a carbohydrate restricted/protein rich diet.  - Patient admits there is a room for improvement in his diet and drink choices. -  Suggestion is made for him to avoid simple carbohydrates  from his diet including Cakes, Sweet Desserts / Pastries, Ice Cream, Soda (diet and regular), Sweet Tea, Candies, Chips, Cookies, Store Bought Juices, Alcohol in Excess of  1-2 drinks a day, Artificial Sweeteners, and "Sugar-free" Products. This will help patient to have stable blood glucose profile and potentially avoid unintended weight gain.  - I encouraged the patient to switch to  unprocessed or minimally processed complex starch and increased protein intake (animal or plant source), fruits, and vegetables.  - Patient is advised to stick to a routine mealtimes to eat 3 meals  a day and avoid unnecessary snacks ( to snack only to correct hypoglycemia).   - I have approached patient with the following individualized plan to manage diabetes and patient agrees:   -Based on his reported near target glycemic profile, he will not require bolus insulin for now.    -He is advised to continue Lantus 70 units nightly,  associated with strict monitoring of blood glucose 2 times a day-daily before breakfast and at bedtime.  -Patient is encouraged to call clinic for blood glucose levels less than 70 or above 300 mg /dl.  -He will continue metformin  500 mg p.o. twice daily-after breakfast and supper.   - Patient will be considered for incretin therapy  as appropriate next visit.  2) BP/HTN: he is advised to home monitor blood pressure and report if > 140/90 on 2 separate readings.   He is advised to continue his current blood pressure medications including lisinopril/hydrochlorthiazide  10/12.5 mg po daily.  3) Lipids/HPL: His recent lipid panel showed improved LDL to 95.  He is advised to continue simvastatin 40 mg p.o. nightly.    Side effects and precautions discussed with him.   4) Chronic Care/Health Maintenance:  -Patient is on ACEI/ARB and Statin medications and encouraged to continue to follow up with Ophthalmology, Podiatrist at least yearly or according to recommendations, and advised to   stay away from smoking. I have recommended yearly flu vaccine and pneumonia vaccination at least every 5 years; moderate intensity exercise for up to 150 minutes weekly; and  sleep for at least 7 hours a day.  - I advised patient to maintain close follow up with Fayrene Helper, MD for primary care needs.  - Patient Care Time Today:  25 min, of which >50% was spent in reviewing his  current and  previous labs/studies, previous treatments, and medications doses and developing a plan for long-term care based on the latest recommendations for standards of care.  John [name] participated in the discussions, expressed understanding, and voiced agreement with the above plans.  All questions were answered to his satisfaction. he is encouraged to contact clinic should he have any questions or concerns prior to his return visit.   Follow up plan: - Return in about 4 months (around 02/15/2019) for Follow up with Pre-visit Labs, Meter, and Logs.  Glade Lloyd, MD Phone: [phone]  Fax: [phone]   This note was partially dictated with voice recognition software. Similar sounding words can be transcribed inadequately or may not  be corrected upon review.  10/16/2018, 3:51 PM

## 2018-10-16 NOTE — Progress Notes (Signed)
Virtual Visit via Telephone Note   This visit type was conducted due to national recommendations for restrictions regarding the COVID-19 Pandemic (e.g. social distancing) in an effort to limit this patient's exposure and mitigate transmission in our community.  Due to his co-morbid illnesses, this patient is at least at moderate risk for complications without adequate follow up.  This format is felt to be most appropriate for this patient at this time.  The patient did not have access to video technology/had technical difficulties with video requiring transitioning to audio format only (telephone).  All issues noted in this document were discussed and addressed.  No physical exam could be performed with this format.    Evaluation Performed:  Follow-up visit  Date:  10/16/2018   ID:  John [name], John [name], MRN [id number]  Patient Location: Home Provider Location: Other:  telemedicine  Location of Patient: Home Location of Provider: Telehealth Consent was obtain for visit to be over via telehealth. I verified that I am speaking with the correct person using two identifiers.  PCP:  John Helper, MD   Chief Complaint:  Follow up  History of Present Illness:    John [name] is a 58 y.o. male with history of hypertension, type 2 diabetes with obesity, hyperlipidemia, insomnia, depression with anxiety.  John [name] reports that he is taking his medications as directed and without difficulties or troubles.  Reports that he has no trouble with vision changes, headaches, chest pain or discomfort or any other signs or symptoms of elevated BP.  Reports that his fasting blood sugars are in the 70s to 80s range.  And reports taking his medication without trouble.  He is followed by John [name].  He reports that he tries to eat healthy and eat at least 2 meals per day.  Reports eating well but he does like to eat fried chicken every now and then and some other things that are not as  beneficial for his diet.  But overall he reports that he is eating well and staying with his his diet most days.  Reports that he was about 203- 204 pounds, this is close to what he weighed previously.  Has not lost any weight.    Reports that he needs refills on his insomnia and anxiety medications.  He is followed by John [name] and it looks like John [name] has already refilled these in the last week.  The patient does not have symptoms concerning for COVID-19 infection (fever, chills, cough, or new shortness of breath).   Past Medical, Surgical, Social History, Allergies, and Medications have been Reviewed.  Review of Systems  HENT: Negative.   Eyes: Negative.   Respiratory: Negative.   Cardiovascular: Negative.   Gastrointestinal: Negative.   Endocrine: Negative.   Genitourinary: Negative.   Musculoskeletal: Negative.   Skin: Negative.   Allergic/Immunologic: Negative.   Neurological: Negative.   Hematological: Negative.   Psychiatric/Behavioral: Negative.   All other systems reviewed and are negative.   Past Medical History:  Diagnosis Date  . Anxiety   . Depression 2007  . Diabetes mellitus without complication ([place])   . Hyperlipidemia   . Hypertension   . Hypogonadism male    Past Surgical History:  Procedure Laterality Date  . COLONOSCOPY  01/20/2011   Procedure: COLONOSCOPY;  Surgeon: Dorothyann Peng, MD;  Location: [facility];  Service: Endoscopy;  Laterality: N/A;  . HERNIA REPAIR     ventral hernia repair  Current Meds  Medication Sig  . ACCU-CHEK SOFTCLIX LANCETS lancets 100 each by Other route 4 (four) times daily. Use as instructed  . ALPRAZolam (XANAX) 1 MG tablet Take 1 tablet (1 mg total) by mouth daily as needed for anxiety.  [name] atorvastatin (LIPITOR) 20 MG tablet TAKE 1 TABLET BY MOUTH ONCE DAILY  . blood glucose meter kit and supplies KIT Dispense based on patient and insurance preference. Use up to four times daily as directed. (FOR ICD-9 250.00,  250.01).  . Blood Glucose Monitoring Suppl (ACCU-CHEK AVIVA) device 1 each by Other route 2 (two) times daily. Use as instructed bid. E11.65  . FLUoxetine (PROZAC) 20 MG capsule Take 1 capsule (20 mg total) by mouth daily.  [name] glucose blood (ACCU-CHEK AVIVA) test strip Use to test blood glucose 4 times a day. E11.65  . LANTUS SOLOSTAR 100 UNIT/ML Solostar Pen INJECT 70 UNITS SUBCUTANEOUSLY AT BEDTIME  . lisinopril-hydrochlorothiazide (PRINZIDE,ZESTORETIC) 10-12.5 MG tablet Take 1 tablet by mouth once daily  . metFORMIN (GLUCOPHAGE) 500 MG tablet TAKE 1 TABLET BY MOUTH TWICE DAILY WITH A MEAL  . temazepam (RESTORIL) 30 MG capsule Take 1 capsule (30 mg total) by mouth at bedtime.     Allergies:   Januvia [sitagliptin]; Metformin and related; and Poison oak extract [poison oak extract]   Social History   Tobacco Use  . Smoking status: Never Smoker  . Smokeless tobacco: Never Used  Substance Use Topics  . Alcohol use: Yes    Alcohol/week: 1.0 standard drinks    Types: 1 Cans of beer per week    Comment: occasional beer socially.   . Drug use: No     Family Hx: The patient's family history includes Heart disease in his mother; Heart failure in his mother; Hyperlipidemia in his mother; Hypertension in his brother, mother, and sister. There is no history of Colon cancer.  ROS:   Please see the history of present illness.    All other systems reviewed and are negative.   Labs/Other Tests and Data Reviewed:    Recent Labs: 06/12/2018: ALT 21; BUN 15; Creat 1.24; Potassium 4.3; Sodium 138   Recent Lipid Panel Lab Results  Component Value Date/Time   CHOL 160 06/12/2018 08:48 AM   TRIG 121 06/12/2018 08:48 AM   HDL 43 06/12/2018 08:48 AM   CHOLHDL 3.7 06/12/2018 08:48 AM   LDLCALC 95 06/12/2018 08:48 AM    Wt Readings from Last 3 Encounters:  10/16/18 203 lb (92.1 kg)  06/19/18 209 lb (94.8 kg)  05/23/18 206 lb 1.9 oz (93.5 kg)     Objective:    Vital Signs:  Ht '5\' 8"'   (1.727 m)   Wt 203 lb (92.1 kg)   BMI 30.87 kg/m    GEN:  alert RESPIRATORY:  clear communicaiton without shortness of breath  PSYCH:  normal affect  ASSESSMENT & PLAN:    1.  Diabetes mellitus type 2 in obese ([facility]) Stable, last A1c was 7.8.  He reports his fasting blood sugars are 70s to 80s.  We will continue current medication regime.  Is seen by John [name].  Will get A1c and lab work as well.  - Hemoglobin A1c - COMPLETE METABOLIC PANEL WITH GFR - CBC with Differential/Platelet  2. Essential hypertension Encouraged to try to get 30 minutes of exercise on a daily basis.  This will help with his blood pressure control.  Unable to assess BP at this time.  Advised to continue medications as directed.  Will  check labs  - COMPLETE METABOLIC PANEL WITH GFR - CBC with Differential/Platelet  3. Obesity (BMI 30.0-34.9) Reports that he still likes to eat fried chicken every now and then.  But has been trying to work on his diet.  He is encouraged to maintain a heart healthy, diabetic, low-fat diet.  He is also encouraged to make a goal exercise at least 150 minutes/week accumulative for moderate intensity is brisk walking.  Limit red meats, egg yolks, cheese and creamy items such as ice cream, creamy sauces, soups and dressings, substituting instead with oil-based salad dressings, broth-based soups, etc.  Avoid fatty/greasy foods and sweets.  COVID-19 Education: The signs and symptoms of COVID-19 were discussed with the patient and how to seek care for testing (follow up with PCP or arrange E-visit). The importance of continued social distancing was discussed today.  Time:   Today, I have spent 10 minutes with the patient with telehealth technology discussing the above problems.     Medication Adjustments/Labs and Tests Ordered: Current medicines are reviewed at length with the patient today.  Concerns regarding medicines are outlined above.   Tests Ordered: Orders Placed This Encounter   Procedures  . Hemoglobin A1c  . COMPLETE METABOLIC PANEL WITH GFR  . CBC with Differential/Platelet    Medication Changes: No orders of the defined types were placed in this encounter.   Disposition:  Follow up in 5 month(s)  Signed, Perlie Mayo, NP  10/16/2018 9:57 AM     [facility]

## 2018-10-17 LAB — COMPLETE METABOLIC PANEL WITH GFR
AG Ratio: 1.5 (calc) (ref 1.0–2.5)
ALT: 20 U/L (ref 9–46)
AST: 20 U/L (ref 10–35)
Albumin: 4.1 g/dL (ref 3.6–5.1)
Alkaline phosphatase (APISO): 94 U/L (ref 35–144)
BUN: 11 mg/dL (ref 7–25)
CO2: 32 mmol/L (ref 20–32)
Calcium: 9.5 mg/dL (ref 8.6–10.3)
Chloride: 101 mmol/L (ref 98–110)
Creat: 1.18 mg/dL (ref 0.70–1.33)
GFR, Est African American: 78 mL/min/{1.73_m2} (ref >=60)
GFR, Est Non African American: 68 mL/min/{1.73_m2} (ref >=60)
Globulin: 2.8 g/dL (calc) (ref 1.9–3.7)
Glucose, Bld: 147 mg/dL — ABNORMAL HIGH (ref 65–139)
Potassium: 3.9 mmol/L (ref 3.5–5.3)
Sodium: 139 mmol/L (ref 135–146)
Total Bilirubin: 0.5 mg/dL (ref 0.2–1.2)
Total Protein: 6.9 g/dL (ref 6.1–8.1)

## 2018-10-17 LAB — CBC WITH DIFFERENTIAL/PLATELET
Absolute Monocytes: 642 cells/uL (ref 200–950)
Basophils Absolute: 48 cells/uL (ref 0–200)
Basophils Relative: 0.7 %
Eosinophils Absolute: 449 cells/uL (ref 15–500)
Eosinophils Relative: 6.5 %
HCT: 38.4 % — ABNORMAL LOW (ref 38.5–50.0)
Hemoglobin: 12.9 g/dL — ABNORMAL LOW (ref 13.2–17.1)
Lymphs Abs: 2808 cells/uL (ref 850–3900)
MCH: 31.2 pg (ref 27.0–33.0)
MCHC: 33.6 g/dL (ref 32.0–36.0)
MCV: 93 fL (ref 80.0–100.0)
MPV: 11.6 fL (ref 7.5–12.5)
Monocytes Relative: 9.3 %
Neutro Abs: 2953 cells/uL (ref 1500–7800)
Neutrophils Relative %: 42.8 %
Platelets: 296 10*3/uL (ref 140–400)
RBC: 4.13 10*6/uL — ABNORMAL LOW (ref 4.20–5.80)
RDW: 12.4 % (ref 11.0–15.0)
Total Lymphocyte: 40.7 %
WBC: 6.9 10*3/uL (ref 3.8–10.8)

## 2018-10-17 LAB — HEMOGLOBIN A1C
Hgb A1c MFr Bld: 5.8 % of total Hgb — ABNORMAL HIGH (ref <5.7)
Mean Plasma Glucose: 120 (calc)
eAG (mmol/L): 6.6 (calc)

## 2018-10-17 NOTE — Progress Notes (Signed)
A1c has greatly improved. Kidney and Liver are excellent as well. Red blood cells are alittle low, might need to start an iron supplement if this repeats at next lab check. Make sure getting enough iron in diet.

## 2018-10-18 ENCOUNTER — Ambulatory Visit: Payer: Medicare Other | Admitting: "Endocrinology

## 2018-11-24 ENCOUNTER — Other Ambulatory Visit: Payer: Self-pay | Admitting: "Endocrinology

## 2019-01-05 ENCOUNTER — Other Ambulatory Visit: Payer: Self-pay | Admitting: "Endocrinology

## 2019-01-09 ENCOUNTER — Ambulatory Visit (HOSPITAL_COMMUNITY): Payer: Medicare Other | Admitting: Psychiatry

## 2019-01-15 ENCOUNTER — Encounter: Payer: Self-pay | Admitting: Family Medicine

## 2019-01-15 ENCOUNTER — Encounter (INDEPENDENT_AMBULATORY_CARE_PROVIDER_SITE_OTHER): Payer: Self-pay

## 2019-01-15 ENCOUNTER — Other Ambulatory Visit: Payer: Self-pay

## 2019-01-15 ENCOUNTER — Ambulatory Visit (HOSPITAL_COMMUNITY)
Admission: RE | Admit: 2019-01-15 | Discharge: 2019-01-15 | Disposition: A | Discharge status: Home / Self Care | Payer: Medicare Other | Source: Ambulatory Visit | Attending: Family Medicine | Admitting: Family Medicine

## 2019-01-15 ENCOUNTER — Ambulatory Visit (INDEPENDENT_AMBULATORY_CARE_PROVIDER_SITE_OTHER): Payer: Medicare Other | Admitting: Family Medicine

## 2019-01-15 VITALS — BP 140/78 | HR 87 | Temp 97.5°F | Resp 15 | Ht 68.0 in | Wt 200.0 lb

## 2019-01-15 DIAGNOSIS — R1013 Epigastric pain: Secondary | ICD-10-CM | POA: Diagnosis not present

## 2019-01-15 DIAGNOSIS — E6609 Other obesity due to excess calories: Secondary | ICD-10-CM

## 2019-01-15 DIAGNOSIS — E1169 Type 2 diabetes mellitus with other specified complication: Secondary | ICD-10-CM | POA: Diagnosis not present

## 2019-01-15 DIAGNOSIS — I10 Essential (primary) hypertension: Secondary | ICD-10-CM | POA: Diagnosis not present

## 2019-01-15 DIAGNOSIS — E669 Obesity, unspecified: Secondary | ICD-10-CM | POA: Diagnosis not present

## 2019-01-15 DIAGNOSIS — F418 Other specified anxiety disorders: Secondary | ICD-10-CM | POA: Diagnosis not present

## 2019-01-15 DIAGNOSIS — R768 Other specified abnormal immunological findings in serum: Secondary | ICD-10-CM | POA: Diagnosis not present

## 2019-01-15 DIAGNOSIS — M542 Cervicalgia: Secondary | ICD-10-CM | POA: Diagnosis not present

## 2019-01-15 DIAGNOSIS — Z6830 Body mass index (BMI) 30.0-30.9, adult: Secondary | ICD-10-CM | POA: Diagnosis not present

## 2019-01-15 MED ORDER — PREDNISONE 10 MG PO TABS: 10.0000 mg | ORAL_TABLET | Freq: Two times a day (BID) | ORAL | 0 refills | Status: DC

## 2019-01-15 MED ORDER — IBUPROFEN 800 MG PO TABS: 800.0000 mg | ORAL_TABLET | Freq: Three times a day (TID) | ORAL | 0 refills | Status: DC | PRN

## 2019-01-15 MED ORDER — PANTOPRAZOLE SODIUM 40 MG PO TBEC: 40.0000 mg | DELAYED_RELEASE_TABLET | Freq: Every day | ORAL | 0 refills | Status: DC

## 2019-01-15 MED ORDER — KETOROLAC TROMETHAMINE 60 MG/2ML IM SOLN
60.0000 mg | Freq: Once | INTRAMUSCULAR | Status: AC
  Administered 2019-01-15: 60 mg via INTRAMUSCULAR

## 2019-01-15 MED ORDER — METHYLPREDNISOLONE ACETATE 80 MG/ML IJ SUSP
40.0000 mg | Freq: Once | INTRAMUSCULAR | Status: AC
  Administered 2019-01-15: 40 mg via INTRAMUSCULAR

## 2019-01-15 MED ORDER — ONDANSETRON HCL 4 MG PO TABS: 4.0000 mg | ORAL_TABLET | Freq: Three times a day (TID) | ORAL | 0 refills | Status: DC | PRN

## 2019-01-15 NOTE — Progress Notes (Signed)
John RoundGary K [name]     MRN: [id number]      DOB: 1961/04/03   HPI John [name] is here for follow up and re-evaluation of chronic medical conditions, medication management and review of any available recent lab and radiology data.  Preventive health is updated, specifically  Cancer screening and Immunization.   2 week h/o right neck and shoulder pain radiaiting to arm, pain is constant and at times debilitating C/o increased and uncontrolled depression since losing his Mom 1 day h/o nausea and emesis after eating a fish sandwich, better today, nmo loose stool  Denies polyuria, polydipsia, blurred vision , or hypoglycemic episodes.  ROS Denies recent fever or chills. Denies sinus pressure, nasal congestion, ear pain or sore throat. Denies chest congestion, productive cough or wheezing. Denies chest pains, palpitations and leg swelling Deniesdiarrhea or constipation.   Denies dysuria, frequency, hesitancy or incontinence.  Denies headaches, seizures, numbness, or tingling. Denies skin break down or rash.   PE  BP 140/78   Pulse 87   Temp (!) 97.5 F (36.4 C) (Temporal)   Resp 15   Ht 5\' 8"  (1.727 m)   Wt 200 lb (90.7 kg)   SpO2 98%   BMI 30.41 kg/m   Patient alert and oriented and in no cardiopulmonary distress.  HEENT: No facial asymmetry, EOMI,   oropharynx pink and moist.  Neck decreased ROM, no JVD, no mass.  Chest: Clear to auscultation bilaterally.  CVS: S1, S2 no murmurs, no S3.Regular rate.  ABD: Soft non tender.   Ext: No edema  MS: Adequate ROM spine,  hips and knees.dereased mildly in right shoulder Skin: Intact, no ulcerations or rash noted.  Psych: Good eye contact, flat affect. Memory intact not anxious moderately  depressed appearing.  CNS: CN 2-12 intact, power,  normal throughout.no focal deficits noted.   Assessment & Plan Neck pain on right side Uncontrolled.Toradol and depo medrol administered IM in the office , to be followed by a short  course of oral prednisone and NSAIDS.   Essential hypertension Elevated at visit , but recent h/o emesis, no med change review and adjust at next visit DASH diet and commitment to daily physical activity for a minimum of 30 minutes discussed and encouraged, as a part of hypertension management. The importance of attaining a healthy weight is also discussed.  BP/Weight 01/15/2019 10/16/2018 06/19/2018 05/23/2018 03/14/2018 12/27/2017 10/05/2017  Systolic BP 140 - 128 128 134 409128 136  Diastolic BP 78 - 74 80 86 80 83  Wt. (Lbs) 200 203 209 206.12 205 201.08 205  BMI 30.41 30.87 31.78 31.34 31.17 30.57 31.17  Some encounter information is confidential and restricted. Go to Review Flowsheets activity to see all data.       Diabetes mellitus type 2 in obese [facility](HCC) John [name] is reminded of the importance of commitment to daily physical activity for 30 minutes or more, as able and the need to limit carbohydrate intake to 30 to 60 grams per meal to help with blood sugar control.   The need to take medication as prescribed, test blood sugar as directed, and to call between visits if there is a concern that blood sugar is uncontrolled is also discussed.   John [name] is reminded of the importance of daily foot exam, annual eye examination, and good blood sugar, blood pressure and cholesterol control. Controlled, no change in medication Managed by endo  Diabetic Labs Latest Ref Rng & Units 10/16/2018 06/12/2018 02/06/2018 09/25/2017 07/19/2017  HbA1c <  5.7 % of total Hgb 5.8(H) 7.8(H) 7.4(H) 7.5(H) -  Microalbumin mg/dL - - 5.3 - -  Micro/Creat Ratio <30 mcg/mg creat - - 26 - -  Chol <200 mg/dL - 160 - - 194  HDL >40 mg/dL - 43 - - 41  Calc LDL mg/dL (calc) - 95 - - 127(H)  Triglycerides <150 mg/dL - 121 - - 144  Creatinine 0.70 - 1.33 mg/dL 1.18 1.24 1.13 - 1.23   BP/Weight 01/15/2019 10/16/2018 06/19/2018 05/23/2018 03/14/2018 12/27/2017 03/14/4627  Systolic BP 638 - 177 116 579 038 333  Diastolic BP  78 - 74 80 86 80 83  Wt. (Lbs) 200 203 209 206.12 205 201.08 205  BMI 30.41 30.87 31.78 31.34 31.17 30.57 31.17  Some encounter information is confidential and restricted. Go to Review Flowsheets activity to see all data.   Foot/eye exam completion dates 04/18/2017 01/26/2016  Foot Form Completion Done Done        Dyspepsia Check h pylori status, start PPI  Helicobacter pylori ab+ Symptomatic, needs treatment  Depression with anxiety Severe and uncontrolled since death of Mom approx 3 months ago, will make Psych aware , he has visit later this week

## 2019-01-15 NOTE — Patient Instructions (Signed)
Follow-up as before call if you need me sooner.  You are receiving injections today Toradol 60 mg and Depo-Medrol 40 mg IM for neck pain radiating down her right arm.  Medications are prescribed today also for this problem these are ibuprofen prednisone and Protonix.  Please get an  x-ray of your neck today as the pain in your arm  I believe is from arthritis in the neck and possible disc disease.  Because of one-month history of nausea you are referred please get an H. pylori breath test today to see if you have bacterial infection which may be causing this.  Medication Zofran is also prescribed for as needed use.  Dr. Harrington Challenger has received a message regarding the current status of your mental health which is not good main trigger being the loss of your mom and it is good that you have an near appointment scheduled later this week.  Thanks for choosing [facility], we consider it a privelige to serve you.   I hope that ypou feel better soon

## 2019-01-15 NOTE — Assessment & Plan Note (Signed)
Uncontrolled.Toradol and depo medrol administered IM in the office , to be followed by a short course of oral prednisone and NSAIDS.  

## 2019-01-16 ENCOUNTER — Other Ambulatory Visit: Payer: Self-pay | Admitting: Family Medicine

## 2019-01-16 LAB — H. PYLORI BREATH TEST: H. pylori Breath Test: DETECTED — AB

## 2019-01-16 MED ORDER — AMOXICILL-CLARITHRO-LANSOPRAZ PO MISC: Freq: Two times a day (BID) | ORAL | 0 refills | Status: DC

## 2019-01-16 NOTE — Progress Notes (Signed)
Patient aware.

## 2019-01-17 ENCOUNTER — Other Ambulatory Visit: Payer: Self-pay

## 2019-01-17 ENCOUNTER — Ambulatory Visit (HOSPITAL_COMMUNITY): Payer: Medicare Other | Admitting: Psychiatry

## 2019-01-17 ENCOUNTER — Telehealth (HOSPITAL_COMMUNITY): Payer: Self-pay | Admitting: Psychiatry

## 2019-01-21 ENCOUNTER — Encounter: Payer: Self-pay | Admitting: Family Medicine

## 2019-01-21 DIAGNOSIS — R1013 Epigastric pain: Secondary | ICD-10-CM | POA: Insufficient documentation

## 2019-01-21 DIAGNOSIS — R768 Other specified abnormal immunological findings in serum: Secondary | ICD-10-CM | POA: Insufficient documentation

## 2019-01-21 HISTORY — DX: Other specified abnormal immunological findings in serum: R76.8

## 2019-01-21 NOTE — Assessment & Plan Note (Signed)
  Patient re-educated about  the importance of commitment to a  minimum of 150 minutes of exercise per week as able.  The importance of healthy food choices with portion control discussed, as well as eating regularly and within a 12 hour window most days. The need to choose "clean , green" food 50 to 75% of the time is discussed, as well as to make water the primary drink and set a goal of 64 ounces water daily.    Weight /BMI 01/15/2019 10/16/2018 06/19/2018  WEIGHT 200 lb 203 lb 209 lb  HEIGHT 5\' 8"  5\' 8"  5\' 8"   BMI 30.41 kg/m2 30.87 kg/m2 31.78 kg/m2  Some encounter information is confidential and restricted. Go to Review Flowsheets activity to see all data.

## 2019-01-21 NOTE — Assessment & Plan Note (Signed)
Mr. Mollica is reminded of the importance of commitment to daily physical activity for 30 minutes or more, as able and the need to limit carbohydrate intake to 30 to 60 grams per meal to help with blood sugar control.   The need to take medication as prescribed, test blood sugar as directed, and to call between visits if there is a concern that blood sugar is uncontrolled is also discussed.   Mr. Cu is reminded of the importance of daily foot exam, annual eye examination, and good blood sugar, blood pressure and cholesterol control. Controlled, no change in medication Managed by endo  Diabetic Labs Latest Ref Rng & Units 10/16/2018 06/12/2018 02/06/2018 09/25/2017 07/19/2017  HbA1c <5.7 % of total Hgb 5.8(H) 7.8(H) 7.4(H) 7.5(H) -  Microalbumin mg/dL - - 5.3 - -  Micro/Creat Ratio <30 mcg/mg creat - - 26 - -  Chol <200 mg/dL - 160 - - 194  HDL >40 mg/dL - 43 - - 41  Calc LDL mg/dL (calc) - 95 - - 127(H)  Triglycerides <150 mg/dL - 121 - - 144  Creatinine 0.70 - 1.33 mg/dL 1.18 1.24 1.13 - 1.23   BP/Weight 01/15/2019 10/16/2018 06/19/2018 05/23/2018 03/14/2018 12/27/2017 03/05/3845  Systolic BP 659 - 935 701 779 390 300  Diastolic BP 78 - 74 80 86 80 83  Wt. (Lbs) 200 203 209 206.12 205 201.08 205  BMI 30.41 30.87 31.78 31.34 31.17 30.57 31.17  Some encounter information is confidential and restricted. Go to Review Flowsheets activity to see all data.   Foot/eye exam completion dates 04/18/2017 01/26/2016  Foot Form Completion Done Done

## 2019-01-21 NOTE — Assessment & Plan Note (Signed)
Elevated at visit , but recent h/o emesis, no med change review and adjust at next visit DASH diet and commitment to daily physical activity for a minimum of 30 minutes discussed and encouraged, as a part of hypertension management. The importance of attaining a healthy weight is also discussed.  BP/Weight 01/15/2019 10/16/2018 06/19/2018 05/23/2018 03/14/2018 12/27/2017 08/18/6008  Systolic BP 932 - 355 732 202 542 706  Diastolic BP 78 - 74 80 86 80 83  Wt. (Lbs) 200 203 209 206.12 205 201.08 205  BMI 30.41 30.87 31.78 31.34 31.17 30.57 31.17  Some encounter information is confidential and restricted. Go to Review Flowsheets activity to see all data.

## 2019-01-21 NOTE — Assessment & Plan Note (Signed)
Check h pylori status, start PPI

## 2019-01-21 NOTE — Assessment & Plan Note (Signed)
Symptomatic, needs treatment

## 2019-01-21 NOTE — Assessment & Plan Note (Signed)
Severe and uncontrolled since death of Mom approx 3 months ago, will make Psych aware , he has visit later this week

## 2019-01-23 ENCOUNTER — Telehealth: Payer: Self-pay | Admitting: Family Medicine

## 2019-01-23 NOTE — Telephone Encounter (Signed)
pls call , ensure pt is taking his prevpac,  For H pylori infection' Needs to not take the atorvastatin while on this course due to drug interaction, plwease let him know

## 2019-01-23 NOTE — Telephone Encounter (Signed)
Wife and patient aware to not take cholesterol med while on the 14 day treatment

## 2019-02-12 ENCOUNTER — Other Ambulatory Visit (HOSPITAL_COMMUNITY): Payer: Self-pay | Admitting: Psychiatry

## 2019-02-12 NOTE — Telephone Encounter (Signed)
Per Provider: Please call pt for appointment, missed last one.  Approved Prescription request Alprazolam (Xanax)   *LVM

## 2019-02-12 NOTE — Telephone Encounter (Signed)
Please call pt for appointment, missed last one

## 2019-02-14 ENCOUNTER — Encounter (HOSPITAL_COMMUNITY): Payer: Self-pay | Admitting: Psychiatry

## 2019-02-14 ENCOUNTER — Other Ambulatory Visit: Payer: Self-pay

## 2019-02-14 ENCOUNTER — Ambulatory Visit (INDEPENDENT_AMBULATORY_CARE_PROVIDER_SITE_OTHER): Payer: Medicare Other | Admitting: Psychiatry

## 2019-02-14 DIAGNOSIS — F322 Major depressive disorder, single episode, severe without psychotic features: Secondary | ICD-10-CM | POA: Diagnosis not present

## 2019-02-14 MED ORDER — FLUOXETINE HCL 20 MG PO CAPS: 20.0000 mg | ORAL_CAPSULE | Freq: Every day | ORAL | 2 refills | Status: DC

## 2019-02-14 MED ORDER — TEMAZEPAM 30 MG PO CAPS: 30.0000 mg | ORAL_CAPSULE | Freq: Every day | ORAL | 2 refills | Status: DC

## 2019-02-14 MED ORDER — ALPRAZOLAM 1 MG PO TABS: ORAL_TABLET | ORAL | 2 refills | Status: DC

## 2019-02-14 NOTE — Progress Notes (Signed)
[facility] MD/PA/NP OP Progress Note  02/14/2019 9:39 AM John [name]  MRN:  [id number] Virtual Visit via Telephone Note  I connected with John [name] on 02/14/19 at  9:20 AM EDT by telephone and verified that I am speaking with the correct person using two identifiers.   I discussed the limitations, risks, security and privacy concerns of performing an evaluation and management service by telephone and the availability of in person appointments. I also discussed with the patient that there may be a patient responsible charge related to this service. The patient expressed understanding and agreed to proceed.     I discussed the assessment and treatment plan with the patient. The patient was provided an opportunity to ask questions and all were answered. The patient agreed with the plan and demonstrated an understanding of the instructions.   The patient was advised to call back or seek an in-person evaluation if the symptoms worsen or if the condition fails to improve as anticipated.  I provided 15 minutes of non-face-to-face time during this encounter.   Levonne Spiller, MD   Chief Complaint:  Chief Complaint    Depression; Anxiety; Follow-up     HPI: This patient is a 58 year old married black male who lives with his wife in [place].  He had 1 son who died at age 37 and 30 in a motor vehicle accident.  He is currently on disability.  Patient returns after 4 months.  He has missed some appointments.  He states that overall he is doing okay.  He still grieving the loss of his mother who died in Oct 10, 2022.  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  His wife has difficulties with urinary incontinence at night he is often interrupted in his sleep.  However he thinks he is getting enough rest.  He is grieving his mother but denies serious depression or suicidal ideation and the Xanax continues to help his anxiety.  He denies any thoughts  of self-harm. Visit Diagnosis:    ICD-10-CM   1. Major depressive disorder, single episode, severe without psychotic features ([place])  F32.2     Past Psychiatric History: none  Past Medical History:  Past Medical History:  Diagnosis Date  . Anxiety   . Depression 2007  . Diabetes mellitus without complication ([place])   . Hyperlipidemia   . Hypertension   . Hypogonadism male     Past Surgical History:  Procedure Laterality Date  . COLONOSCOPY  01/20/2011   Procedure: COLONOSCOPY;  Surgeon: Dorothyann Peng, MD;  Location: [facility];  Service: Endoscopy;  Laterality: N/A;  . HERNIA REPAIR     ventral hernia repair     Family Psychiatric History: none  Family History:  Family History  Problem Relation Age of Onset  . Heart failure Mother        living   . Hypertension Mother   . Hyperlipidemia Mother   . Heart disease Mother   . Hypertension Sister   . Hypertension Brother   . Colon cancer Neg Hx     Social History:  Social History   Socioeconomic History  . Marital status: Married    Spouse name: Not on file  . Number of children: 1  . Years of education: Not on file  . Highest education level: Not on file  Occupational History  . Occupation: Financial risk analyst  . Financial resource strain:  Not on file  . Food insecurity    Worry: Not on file    Inability: Not on file  . Transportation needs    Medical: Not on file    Non-medical: Not on file  Tobacco Use  . Smoking status: Never Smoker  . Smokeless tobacco: Never Used  Substance and Sexual Activity  . Alcohol use: Yes    Alcohol/week: 1.0 standard drinks    Types: 1 Cans of beer per week    Comment: occasional beer socially.   . Drug use: No  . Sexual activity: Yes  Lifestyle  . Physical activity    Days per week: Not on file    Minutes per session: Not on file  . Stress: Not on file  Relationships  . Social Herbalist on phone: Not on file    Gets together: Not on file     Attends religious service: Not on file    Active member of club or organization: Not on file    Attends meetings of clubs or organizations: Not on file    Relationship status: Not on file  Other Topics Concern  . Not on file  Social History Narrative  . Not on file    Allergies:  Allergies  Allergen Reactions  . Januvia [Sitagliptin] Nausea And Vomiting  . Metformin And Related Nausea Only  . Poison Oak Extract [Poison Oak Extract] Dermatitis    Metabolic Disorder Labs: Lab Results  Component Value Date   HGBA1C 5.8 (H) 10/16/2018   MPG 120 10/16/2018   MPG 177 06/12/2018   No results found for: PROLACTIN Lab Results  Component Value Date   CHOL 160 06/12/2018   TRIG 121 06/12/2018   HDL 43 06/12/2018   CHOLHDL 3.7 06/12/2018   VLDL 64 (H) 12/30/2016   LDLCALC 95 06/12/2018   LDLCALC 127 (H) 07/19/2017   Lab Results  Component Value Date   TSH 2.35 01/26/2016   TSH 2.908 09/17/2014    Therapeutic Level Labs: No results found for: LITHIUM No results found for: VALPROATE No components found for:  CBMZ  Current Medications: Current Outpatient Medications  Medication Sig Dispense Refill  . ACCU-CHEK SOFTCLIX LANCETS lancets 100 each by Other route 4 (four) times daily. Use as instructed 100 each 5  . ALPRAZolam (XANAX) 1 MG tablet TAKE 1 TABLET BY MOUTH ONCE DAILY AS NEEDED FOR ANXIETY 30 tablet 2  . amoxicillin-clarithromycin-lansoprazole (PREVPAC) combo pack Take by mouth 2 (two) times daily for 14 days. Follow package directions. 1 each 0  . atorvastatin (LIPITOR) 20 MG tablet TAKE 1 TABLET BY MOUTH ONCE DAILY 30 tablet 3  . blood glucose meter kit and supplies KIT Dispense based on patient and insurance preference. Use up to four times daily as directed. (FOR ICD-9 250.00, 250.01). 1 each 0  . Blood Glucose Monitoring Suppl (ACCU-CHEK AVIVA) device 1 each by Other route 2 (two) times daily. Use as instructed bid. E11.65 1 each 0  . FLUoxetine (PROZAC) 20 MG  capsule Take 1 capsule (20 mg total) by mouth daily. 90 capsule 2  . glucose blood (ACCU-CHEK AVIVA) test strip Use to test blood glucose 4 times a day. E11.65 150 each 5  . ibuprofen (ADVIL) 800 MG tablet Take 1 tablet (800 mg total) by mouth every 8 (eight) hours as needed. 21 tablet 0  . LANTUS SOLOSTAR 100 UNIT/ML Solostar Pen INJECT 70 UNITS SUBCUTANEOUSLY AT BEDTIME 60 mL 0  . lisinopril-hydrochlorothiazide (PRINZIDE,ZESTORETIC) 10-12.5 MG tablet  Take 1 tablet by mouth once daily 90 tablet 0  . metFORMIN (GLUCOPHAGE) 500 MG tablet Take 1/2 (one-half) tablet by mouth twice daily 90 tablet 0  . ondansetron (ZOFRAN) 4 MG tablet Take 1 tablet (4 mg total) by mouth every 8 (eight) hours as needed for nausea or vomiting. 15 tablet 0  . pantoprazole (PROTONIX) 40 MG tablet Take 1 tablet (40 mg total) by mouth daily. 14 tablet 0  . predniSONE (DELTASONE) 10 MG tablet Take 1 tablet (10 mg total) by mouth 2 (two) times daily with a meal. 10 tablet 0  . temazepam (RESTORIL) 30 MG capsule Take 1 capsule (30 mg total) by mouth at bedtime. 30 capsule 2   No current facility-administered medications for this visit.      Musculoskeletal: Strength & Muscle Tone: within normal limits Gait & Station: normal Patient leans: N/A  Psychiatric Specialty Exam: Review of Systems  Musculoskeletal: Positive for joint pain.  All other systems reviewed and are negative.   There were no vitals taken for this visit.There is no height or weight on file to calculate BMI.  General Appearance: NA  Eye Contact:  NA  Speech:  Clear and Coherent  Volume:  Decreased  Mood:  Dysphoric  Affect:  NA  Thought Process:  Goal Directed  Orientation:  Full (Time, Place, and Person)  Thought Content: Rumination   Suicidal Thoughts:  No  Homicidal Thoughts:  No  Memory:  Immediate;   Good Recent;   Good Remote;   Fair  Judgement:  Fair  Insight:  Fair  Psychomotor Activity:  Normal  Concentration:  Concentration:  Fair and Attention Span: Fair  Recall:  [employer] of Knowledge: Fair  Language: Good  Akathisia:  No  Handed:  Right  AIMS (if indicated): not done  Assets:  Communication Skills Desire for Improvement Resilience Social Support  ADL's:  Intact  Cognition: Mild impairment, unable to read and write  Sleep:  Fair   Screenings: PHQ2-9     Office Visit from 01/15/2019 in [facility] Office Visit from 10/16/2018 in [facility] Office Visit from 05/23/2018 in [facility] Office Visit from 12/27/2017 in [facility] Visit from 10/05/2017 in [facility]  PHQ-2 Total Score  6  0  2  4  0  PHQ-9 Total Score  22  -  7  14  -       Assessment and Plan: This patient is a this patient is a 58 year old male with a history of depression particularly since his son was killed in 2005.  His depression worsened a little bit after his mother died but he states that he overall he is doing well.  He did score high on his PHQ 9 in his primary care office on 728 but he states that things have gotten better since.  He would like to remain on his current regimen and he denies any thoughts of self-harm.  He will continue Prozac 20 mg daily for depression, Xanax 1 mg daily as needed for anxiety and temazepam 30 mg at bedtime for sleep.  He will return to see me in 3 months   Levonne Spiller, MD 02/14/2019, 9:39 AM

## 2019-02-18 ENCOUNTER — Ambulatory Visit: Payer: Medicare Other | Admitting: "Endocrinology

## 2019-02-26 ENCOUNTER — Telehealth: Payer: Self-pay | Admitting: Family Medicine

## 2019-02-26 DIAGNOSIS — E1169 Type 2 diabetes mellitus with other specified complication: Secondary | ICD-10-CM | POA: Diagnosis not present

## 2019-02-26 DIAGNOSIS — E669 Obesity, unspecified: Secondary | ICD-10-CM | POA: Diagnosis not present

## 2019-02-26 NOTE — Telephone Encounter (Signed)
Pt called back confused on which pill he was supposed to take if he was nauseated would like a return call

## 2019-02-26 NOTE — Telephone Encounter (Signed)
Called patient and again told him which medication was for nausea and vomiting. Asked patient to write n+v on the top to help him remember. He said he did this. He stated that is the only one he was confused about.

## 2019-02-26 NOTE — Telephone Encounter (Signed)
Please call the pt regarding his medication

## 2019-02-26 NOTE — Telephone Encounter (Signed)
Spoke with patient and went over his medication list and helped him understand the ones he was confused on.

## 2019-02-27 LAB — HEMOGLOBIN A1C
Hgb A1c MFr Bld: 6.5 % of total Hgb — ABNORMAL HIGH (ref <5.7)
Mean Plasma Glucose: 140 (calc)
eAG (mmol/L): 7.7 (calc)

## 2019-02-27 LAB — COMPLETE METABOLIC PANEL WITH GFR
AG Ratio: 1.5 (calc) (ref 1.0–2.5)
ALT: 21 U/L (ref 9–46)
AST: 19 U/L (ref 10–35)
Albumin: 4.3 g/dL (ref 3.6–5.1)
Alkaline phosphatase (APISO): 90 U/L (ref 35–144)
BUN: 13 mg/dL (ref 7–25)
CO2: 29 mmol/L (ref 20–32)
Calcium: 10 mg/dL (ref 8.6–10.3)
Chloride: 102 mmol/L (ref 98–110)
Creat: 1.14 mg/dL (ref 0.70–1.33)
GFR, Est African American: 81 mL/min/{1.73_m2} (ref >=60)
GFR, Est Non African American: 70 mL/min/{1.73_m2} (ref >=60)
Globulin: 2.8 g/dL (calc) (ref 1.9–3.7)
Glucose, Bld: 78 mg/dL (ref 65–99)
Potassium: 4.3 mmol/L (ref 3.5–5.3)
Sodium: 139 mmol/L (ref 135–146)
Total Bilirubin: 0.9 mg/dL (ref 0.2–1.2)
Total Protein: 7.1 g/dL (ref 6.1–8.1)

## 2019-03-01 ENCOUNTER — Encounter: Payer: Self-pay | Admitting: "Endocrinology

## 2019-03-01 ENCOUNTER — Encounter (INDEPENDENT_AMBULATORY_CARE_PROVIDER_SITE_OTHER): Payer: Self-pay

## 2019-03-01 ENCOUNTER — Other Ambulatory Visit: Payer: Self-pay

## 2019-03-01 ENCOUNTER — Ambulatory Visit (INDEPENDENT_AMBULATORY_CARE_PROVIDER_SITE_OTHER): Payer: Medicare Other | Admitting: "Endocrinology

## 2019-03-01 DIAGNOSIS — E669 Obesity, unspecified: Secondary | ICD-10-CM

## 2019-03-01 DIAGNOSIS — E1169 Type 2 diabetes mellitus with other specified complication: Secondary | ICD-10-CM | POA: Diagnosis not present

## 2019-03-01 DIAGNOSIS — E782 Mixed hyperlipidemia: Secondary | ICD-10-CM | POA: Diagnosis not present

## 2019-03-01 DIAGNOSIS — I10 Essential (primary) hypertension: Secondary | ICD-10-CM | POA: Diagnosis not present

## 2019-03-01 MED ORDER — LANTUS SOLOSTAR 100 UNIT/ML ~~LOC~~ SOPN: PEN_INJECTOR | SUBCUTANEOUS | 0 refills | Status: DC

## 2019-03-01 NOTE — Progress Notes (Signed)
Endocrinology Telehealth Visit Follow up Note -During COVID -19 Pandemic  This visit type was conducted due to national recommendations for restrictions regarding the COVID-19 Pandemic  in an effort to limit this patient's exposure and mitigate transmission of the corona virus.  Due to his co-morbid illnesses, John [name] is at  moderate to high risk for complications without adequate follow up.  This format is felt to be most appropriate for him at this time.  I connected with this patient on 03/01/2019   by telephone and verified that I am speaking with the correct person using two identifiers. [name], 10/09/1960. he has verbally consented to this visit. All issues noted in this document were discussed and addressed. The format was not optimal for physical exam.   Subjective:    Patient ID: John [name], male    DOB: 27-Oct-1960. Patient is being engaged in telehealth in follow-up for management of chronically uncontrolled type 2 diabetes, hyperlipidemia, hypertension. PMD:   Fayrene Helper, MD  Past Medical History:  Diagnosis Date  . Anxiety   . Depression 2007  . Diabetes mellitus without complication ([place])   . Hyperlipidemia   . Hypertension   . Hypogonadism male    Past Surgical History:  Procedure Laterality Date  . COLONOSCOPY  01/20/2011   Procedure: COLONOSCOPY;  Surgeon: Dorothyann Peng, MD;  Location: [facility];  Service: Endoscopy;  Laterality: N/A;  . HERNIA REPAIR     ventral hernia repair    Social History   Socioeconomic History  . Marital status: Married    Spouse name: Not on file  . Number of children: 1  . Years of education: Not on file  . Highest education level: Not on file  Occupational History  . Occupation: Financial risk analyst  . Financial resource strain: Not on file  . Food insecurity    Worry: Not on file    Inability: Not on file  . Transportation needs    Medical:  Not on file    Non-medical: Not on file  Tobacco Use  . Smoking status: Never Smoker  . Smokeless tobacco: Never Used  Substance and Sexual Activity  . Alcohol use: Yes    Alcohol/week: 1.0 standard drinks    Types: 1 Cans of beer per week    Comment: occasional beer socially.   . Drug use: No  . Sexual activity: Yes  Lifestyle  . Physical activity    Days per week: Not on file    Minutes per session: Not on file  . Stress: Not on file  Relationships  . Social Herbalist on phone: Not on file    Gets together: Not on file    Attends religious service: Not on file    Active member of club or organization: Not on file    Attends meetings of clubs or organizations: Not on file    Relationship status: Not on file  Other Topics Concern  . Not on file  Social History Narrative  . Not on file   Outpatient Encounter Medications as of 03/01/2019  Medication Sig  . ACCU-CHEK SOFTCLIX LANCETS lancets 100 each by Other  route 4 (four) times daily. Use as instructed  . ALPRAZolam (XANAX) 1 MG tablet TAKE 1 TABLET BY MOUTH ONCE DAILY AS NEEDED FOR ANXIETY  . amoxicillin-clarithromycin-lansoprazole (PREVPAC) combo pack Take by mouth 2 (two) times daily for 14 days. Follow package directions.  [name] atorvastatin (LIPITOR) 20 MG tablet TAKE 1 TABLET BY MOUTH ONCE DAILY  . blood glucose meter kit and supplies KIT Dispense based on patient and insurance preference. Use up to four times daily as directed. (FOR ICD-9 250.00, 250.01).  . Blood Glucose Monitoring Suppl (ACCU-CHEK AVIVA) device 1 each by Other route 2 (two) times daily. Use as instructed bid. E11.65  . FLUoxetine (PROZAC) 20 MG capsule Take 1 capsule (20 mg total) by mouth daily.  [name] glucose blood (ACCU-CHEK AVIVA) test strip Use to test blood glucose 4 times a day. E11.65  . ibuprofen (ADVIL) 800 MG tablet Take 1 tablet (800 mg total) by mouth every 8 (eight) hours as needed.  . Insulin Glargine (LANTUS SOLOSTAR) 100 UNIT/ML  Solostar Pen INJECT 60 UNITS SUBCUTANEOUSLY AT BEDTIME  . lisinopril-hydrochlorothiazide (PRINZIDE,ZESTORETIC) 10-12.5 MG tablet Take 1 tablet by mouth once daily  . metFORMIN (GLUCOPHAGE) 500 MG tablet Take 1/2 (one-half) tablet by mouth twice daily  . ondansetron (ZOFRAN) 4 MG tablet Take 1 tablet (4 mg total) by mouth every 8 (eight) hours as needed for nausea or vomiting.  . pantoprazole (PROTONIX) 40 MG tablet Take 1 tablet (40 mg total) by mouth daily.  . predniSONE (DELTASONE) 10 MG tablet Take 1 tablet (10 mg total) by mouth 2 (two) times daily with a meal.  . temazepam (RESTORIL) 30 MG capsule Take 1 capsule (30 mg total) by mouth at bedtime.  . [DISCONTINUED] LANTUS SOLOSTAR 100 UNIT/ML Solostar Pen INJECT 70 UNITS SUBCUTANEOUSLY AT BEDTIME   No facility-administered encounter medications on file as of 03/01/2019.    ALLERGIES: Allergies  Allergen Reactions  . Januvia [Sitagliptin] Nausea And Vomiting  . Metformin And Related Nausea Only  . Poison Oak Extract [Poison Oak Extract] Dermatitis   VACCINATION STATUS: Immunization History  Administered Date(s) Administered  . Influenza Whole 03/18/2010, 04/13/2011  . Influenza,inj,Quad PF,6+ Mos 05/20/2013, 05/08/2014, 06/11/2015, 04/18/2017, 05/23/2018  . Pneumococcal Polysaccharide-23 11/26/2013  . Td 09/15/2009    Diabetes He presents for his follow-up diabetic visit. He has type 2 diabetes mellitus. Onset time: He was diagnosed at age 58. His disease course has been improving. There are no hypoglycemic associated symptoms. Pertinent negatives for hypoglycemia include no headaches, seizures or tremors. Pertinent negatives for diabetes include no blurred vision, no chest pain, no polydipsia and no polyuria. There are no hypoglycemic complications. Symptoms are improving. There are no diabetic complications. Risk factors for coronary artery disease include dyslipidemia, diabetes mellitus, hypertension, male sex, obesity and sedentary  lifestyle. Current diabetic treatment includes oral agent (monotherapy). He is following a generally unhealthy diet. He has not had a previous visit with a dietitian. He never participates in exercise. His breakfast blood glucose range is generally 140-180 mg/dl. His overall blood glucose range is 140-180 mg/dl. An ACE inhibitor/angiotensin II receptor blocker is being taken. Eye exam is current.  Hyperlipidemia This is a chronic problem. The current episode started more than 1 year ago. The problem is uncontrolled. Exacerbating diseases include diabetes and obesity. Pertinent negatives include no chest pain, myalgias or shortness of breath. Current antihyperlipidemic treatment includes statins. Risk factors for coronary artery disease include diabetes mellitus, hypertension, male sex, obesity, a sedentary lifestyle and dyslipidemia.  Hypertension This is  a chronic problem. The current episode started more than 1 year ago. The problem is controlled. Pertinent negatives include no blurred vision, chest pain, headaches, palpitations or shortness of breath. Risk factors for coronary artery disease include dyslipidemia, diabetes mellitus, male gender, obesity and sedentary lifestyle. Past treatments include ACE inhibitors and diuretics.   Review of systems: Limited as above.   Objective:    There were no vitals taken for this visit.  Wt Readings from Last 3 Encounters:  01/15/19 200 lb (90.7 kg)  10/16/18 203 lb (92.1 kg)  06/19/18 209 lb (94.8 kg)      CMP     Component Value Date/Time   NA 139 02/26/2019 0803   K 4.3 02/26/2019 0803   CL 102 02/26/2019 0803   CO2 29 02/26/2019 0803   GLUCOSE 78 02/26/2019 0803   BUN 13 02/26/2019 0803   CREATININE 1.14 02/26/2019 0803   CALCIUM 10.0 02/26/2019 0803   PROT 7.1 02/26/2019 0803   ALBUMIN 4.1 12/30/2016 0950   AST 19 02/26/2019 0803   ALT 21 02/26/2019 0803   ALKPHOS 95 12/30/2016 0950   BILITOT 0.9 02/26/2019 0803   GFRNONAA 70  02/26/2019 0803   GFRAA 81 02/26/2019 0803    Lab Results  Component Value Date   HGBA1C 6.5 (H) 02/26/2019   HGBA1C 5.8 (H) 10/16/2018   HGBA1C 7.8 (H) 06/12/2018    Lipid Panel     Component Value Date/Time   CHOL 160 06/12/2018 0848   TRIG 121 06/12/2018 0848   HDL 43 06/12/2018 0848   CHOLHDL 3.7 06/12/2018 0848   VLDL 64 (H) 12/30/2016 0950   [place] 95 06/12/2018 0848     Assessment & Plan:   1. Diabetes mellitus type 2 in obese [facility])  - Patient has currently uncontrolled symptomatic type 2 DM since  58 years of age. -He reports near target glycemic profile, his previsit A1c is 6.5%, progressively improving.      His diabetes is complicated by obesity , prior history of noncompliance/nonadherence, and patient remains at a high risk for more acute and chronic complications of diabetes which include CAD, CVA, CKD, retinopathy, and neuropathy. These are all discussed in detail with the patient.  - I have counseled the patient on diet management and weight loss, by adopting a carbohydrate restricted/protein rich diet. - he  admits there is a room for improvement in his diet and drink choices. -  Suggestion is made for him to avoid simple carbohydrates  from his diet including Cakes, Sweet Desserts / Pastries, Ice Cream, Soda (diet and regular), Sweet Tea, Candies, Chips, Cookies, Sweet Pastries,  Store Bought Juices, Alcohol in Excess of  1-2 drinks a day, Artificial Sweeteners, Coffee Creamer, and "Sugar-free" Products. This will help patient to have stable blood glucose profile and potentially avoid unintended weight gain.   - I encouraged the patient to switch to  unprocessed or minimally processed complex starch and increased protein intake (animal or plant source), fruits, and vegetables.  - Patient is advised to stick to a routine mealtimes to eat 3 meals  a day and avoid unnecessary snacks ( to snack only to correct hypoglycemia).   - I have approached patient with  the following individualized plan to manage diabetes and patient agrees:   -Based on his reported near target glycemic profile, he will not require bolus insulin for now.    -He is advised to lower his Lantus to 60  units nightly,  associated with strict monitoring  of blood glucose 2 times a day-daily before breakfast and at bedtime.  -Patient is encouraged to call clinic for blood glucose levels less than 70 or above 300 mg /dl.  -He will continue metformin  500 mg p.o. twice daily-after breakfast and supper.   - Patient will be considered for incretin therapy as appropriate next visit.  2) BP/HTN: he is advised to home monitor blood pressure and report if > 140/90 on 2 separate readings.   He is advised to continue his current blood pressure medications including lisinopril/hydrochlorthiazide  10/12.5 mg po daily.  3) Lipids/HPL: His recent lipid panel showed improved LDL to 95.  He is advised to continue simvastatin 40 mg p.o. nightly.     Side effects and precautions discussed with him.   4) Chronic Care/Health Maintenance:  -Patient is on ACEI/ARB and Statin medications and encouraged to continue to follow up with Ophthalmology, Podiatrist at least yearly or according to recommendations, and advised to   stay away from smoking. I have recommended yearly flu vaccine and pneumonia vaccination at least every 5 years; moderate intensity exercise for up to 150 minutes weekly; and  sleep for at least 7 hours a day.  - I advised patient to maintain close follow up with Fayrene Helper, MD for primary care needs.  - Patient Care Time Today:  25 min, of which >50% was spent in  counseling and the rest reviewing his  current and  previous labs/studies, previous treatments, his blood glucose readings, and medications' doses and developing a plan for long-term care based on the latest recommendations for standards of care.   [name] participated in the discussions, expressed  understanding, and voiced agreement with the above plans.  All questions were answered to his satisfaction. he is encouraged to contact clinic should he have any questions or concerns prior to his return visit.  Follow up plan: - Return in about 4 months (around 07/01/2019) for Bring Meter and Logs- A1c in Office.  Glade Lloyd, MD Phone: [phone]  Fax: [phone]   This note was partially dictated with voice recognition software. Similar sounding words can be transcribed inadequately or may not  be corrected upon review.  03/01/2019, 1:00 PM

## 2019-03-04 ENCOUNTER — Other Ambulatory Visit: Payer: Self-pay

## 2019-03-04 ENCOUNTER — Ambulatory Visit: Payer: Medicare Other

## 2019-03-18 ENCOUNTER — Encounter: Payer: Self-pay | Admitting: Family Medicine

## 2019-03-18 ENCOUNTER — Ambulatory Visit (INDEPENDENT_AMBULATORY_CARE_PROVIDER_SITE_OTHER): Payer: Medicare Other | Admitting: Family Medicine

## 2019-03-18 ENCOUNTER — Other Ambulatory Visit: Payer: Self-pay

## 2019-03-18 VITALS — BP 122/74 | HR 89 | Temp 97.5°F | Resp 15 | Ht 68.0 in | Wt 196.0 lb

## 2019-03-18 DIAGNOSIS — Z125 Encounter for screening for malignant neoplasm of prostate: Secondary | ICD-10-CM

## 2019-03-18 DIAGNOSIS — E663 Overweight: Secondary | ICD-10-CM | POA: Diagnosis not present

## 2019-03-18 DIAGNOSIS — Z23 Encounter for immunization: Secondary | ICD-10-CM

## 2019-03-18 DIAGNOSIS — F418 Other specified anxiety disorders: Secondary | ICD-10-CM

## 2019-03-18 DIAGNOSIS — Z794 Long term (current) use of insulin: Secondary | ICD-10-CM

## 2019-03-18 DIAGNOSIS — G4700 Insomnia, unspecified: Secondary | ICD-10-CM

## 2019-03-18 DIAGNOSIS — I10 Essential (primary) hypertension: Secondary | ICD-10-CM | POA: Diagnosis not present

## 2019-03-18 DIAGNOSIS — M722 Plantar fascial fibromatosis: Secondary | ICD-10-CM

## 2019-03-18 DIAGNOSIS — E1169 Type 2 diabetes mellitus with other specified complication: Secondary | ICD-10-CM

## 2019-03-18 DIAGNOSIS — E119 Type 2 diabetes mellitus without complications: Secondary | ICD-10-CM

## 2019-03-18 DIAGNOSIS — IMO0001 Reserved for inherently not codable concepts without codable children: Secondary | ICD-10-CM

## 2019-03-18 DIAGNOSIS — E782 Mixed hyperlipidemia: Secondary | ICD-10-CM | POA: Diagnosis not present

## 2019-03-18 DIAGNOSIS — E669 Obesity, unspecified: Secondary | ICD-10-CM

## 2019-03-18 MED ORDER — IBUPROFEN 800 MG PO TABS: ORAL_TABLET | ORAL | 0 refills | Status: DC

## 2019-03-18 NOTE — Assessment & Plan Note (Signed)
Controlled, no change in medication DASH diet and commitment to daily physical activity for a minimum of 30 minutes discussed and encouraged, as a part of hypertension management. The importance of attaining a healthy weight is also discussed.  BP/Weight 03/18/2019 01/15/2019 10/16/2018 06/19/2018 05/23/2018 03/14/2018 7/68/0881  Systolic BP 103 159 - 458 592 924 462  Diastolic BP 74 78 - 74 80 86 80  Wt. (Lbs) 196 200 203 209 206.12 205 201.08  BMI 29.8 30.41 30.87 31.78 31.34 31.17 30.57  Some encounter information is confidential and restricted. Go to Review Flowsheets activity to see all data.

## 2019-03-18 NOTE — Assessment & Plan Note (Signed)
Short course ibuprofen and pt to ice feet twice daily

## 2019-03-18 NOTE — Assessment & Plan Note (Signed)
Hyperlipidemia:Low fat diet discussed and encouraged.   Lipid Panel  Lab Results  Component Value Date   CHOL 134 03/18/2019   HDL 33 (L) 03/18/2019   LDLCALC 82 03/18/2019   TRIG 93 03/18/2019   CHOLHDL 4.1 03/18/2019   Controlled, no change in medication Needs to increase exercise to improve HDL

## 2019-03-18 NOTE — Patient Instructions (Addendum)
F/U in office wit MD in 5 months, call if you need me soioner  Flu vaccine and pneumonia 23 today  Please give pt appt with Dr Jorja Loa ( eye exam) today, if possible  Lipid, TsH, pSA and CbC and microalb today  Foot exam today  I have sent a message to Dr Kathrynn Speed on improved blood sugar  Ibuprofen is prescribed for foot pain, also ice feet twice daily   Plantar Fasciitis  Plantar fasciitis is a painful foot condition that affects the heel. It occurs when the band of tissue that connects the toes to the heel bone (plantar fascia) becomes irritated. This can happen as the result of exercising too much or doing other repetitive activities (overuse injury). The pain from plantar fasciitis can range from mild irritation to severe pain that makes it difficult to walk or move. The pain is usually worse in the morning after sleeping, or after sitting or lying down for a while. Pain may also be worse after long periods of walking or standing. What are the causes? This condition may be caused by:  Standing for long periods of time.  Wearing shoes that do not have good arch support.  Doing activities that put stress on joints (high-impact activities), including running, aerobics, and ballet.  Being overweight.  An abnormal way of walking (gait).  Tight muscles in the back of your lower leg (calf).  High arches in your feet.  Starting a new athletic activity. What are the signs or symptoms? The main symptom of this condition is heel pain. Pain may:  Be worse with first steps after a time of rest, especially in the morning after sleeping or after you have been sitting or lying down for a while.  Be worse after long periods of standing still.  Decrease after 30-45 minutes of activity, such as gentle walking. How is this diagnosed? This condition may be diagnosed based on your medical history and your symptoms. Your health care provider may ask questions about your activity  level. Your health care provider will do a physical exam to check for:  A tender area on the bottom of your foot.  A high arch in your foot.  Pain when you move your foot.  Difficulty moving your foot. You may have imaging tests to confirm the diagnosis, such as:  X-rays.  Ultrasound.  MRI. How is this treated? Treatment for plantar fasciitis depends on how severe your condition is. Treatment may include:  Rest, ice, applying pressure (compression), and raising the affected foot (elevation). This may be called RICE therapy. Your health care provider may recommend RICE therapy along with over-the-counter pain medicines to manage your pain.  Exercises to stretch your calves and your plantar fascia.  A splint that holds your foot in a stretched, upward position while you sleep (night splint).  Physical therapy to relieve symptoms and prevent problems in the future.  Injections of steroid medicine (cortisone) to relieve pain and inflammation.  Stimulating your plantar fascia with electrical impulses (extracorporeal shock wave therapy). This is usually the last treatment option before surgery.  Surgery, if other treatments have not worked after 12 months. Follow these instructions at home:  Managing pain, stiffness, and swelling  If directed, put ice on the painful area: ? Put ice in a plastic bag, or use a frozen bottle of water. ? Place a towel between your skin and the bag or bottle. ? Roll the bottom of your foot over the bag or  bottle. ? Do this for 20 minutes, 2-3 times a day.  Wear athletic shoes that have air-sole or gel-sole cushions, or try wearing soft shoe inserts that are designed for plantar fasciitis.  Raise (elevate) your foot above the level of your heart while you are sitting or lying down. Activity  Avoid activities that cause pain. Ask your health care provider what activities are safe for you.  Do physical therapy exercises and stretches as told by  your health care provider.  Try activities and forms of exercise that are easier on your joints (low-impact). Examples include swimming, water aerobics, and biking. General instructions  Take over-the-counter and prescription medicines only as told by your health care provider.  Wear a night splint while sleeping, if told by your health care provider. Loosen the splint if your toes tingle, become numb, or turn cold and blue.  Maintain a healthy weight, or work with your health care provider to lose weight as needed.  Keep all follow-up visits as told by your health care provider. This is important. Contact a health care provider if you:  Have symptoms that do not go away after caring for yourself at home.  Have pain that gets worse.  Have pain that affects your ability to move or do your daily activities. Summary  Plantar fasciitis is a painful foot condition that affects the heel. It occurs when the band of tissue that connects the toes to the heel bone (plantar fascia) becomes irritated.  The main symptom of this condition is heel pain that may be worse after exercising too much or standing still for a long time.  Treatment varies, but it usually starts with rest, ice, compression, and elevation (RICE therapy) and over-the-counter medicines to manage pain. This information is not intended to replace advice given to you by your health care provider. Make sure you discuss any questions you have with your health care provider. Document Released: 03/01/2001 Document Revised: 05/19/2017 Document Reviewed: 04/03/2017 Elsevier Patient Education  2020 [employer].

## 2019-03-18 NOTE — Assessment & Plan Note (Signed)
Sleep hygiene reviewed and written information offered also. Prescription sent for  medication needed.  

## 2019-03-18 NOTE — Progress Notes (Signed)
John [name]     MRN: [id number]      DOB: 08/29/1960   HPI John [name] is here for follow up and re-evaluation of chronic medical conditions, medication management and review of any available recent lab and radiology data.  Preventive health is updated, specifically  Cancer screening and Immunization.   Questions or concerns regarding consultations or procedures which the PT has had in the interim are  addressed. The PT denies any adverse reactions to current medications since the last visit.  Main c/o worsening depression, denies suicidal or homicidal ideation, lost mother this year , then has been home bound with Covid x 6 months. Treated by psych Denies polyuria, polydipsia, blurred vision , or hypoglycemic episodes.Has poor appetite and sometimes blood sugar is low   ROS Denies recent fever or chills. Denies sinus pressure, nasal congestion, ear pain or sore throat. Denies chest congestion, productive cough or wheezing. Denies chest pains, palpitations and leg swelling Denies abdominal pain, nausea, vomiting,diarrhea or constipation.   Denies dysuria, frequency, hesitancy or incontinence. Denies joint pain, swelling and limitation in mobility.c/o bilateral foot pain worse on starting to walk in past 2 months Denies headaches, seizures, numbness, or tingling.  Denies skin break down or rash.   PE  BP 122/74   Pulse 89   Temp (!) 97.5 F (36.4 C) (Temporal)   Resp 15   Ht 5\' 8"  (1.727 m)   Wt 196 lb (88.9 kg)   SpO2 97%   BMI 29.80 kg/m   Patient alert and oriented and in no cardiopulmonary distress.  HEENT: No facial asymmetry, EOMI,   .  Neck supple no JVD, no mass.  Chest: Clear to auscultation bilaterally.  CVS: S1, S2 no murmurs, no S3.Regular rate.  ABD: Soft non tender.   Ext: No edema  MS: Adequate ROM spine, shoulders, hips and knees.Tender over plantar aspect of foot  Skin: Intact, no ulcerations or rash noted.  Psych: Good eye contact, flat  affect. Memory intact  anxious and  depressed appearing.  CNS: CN 2-12 intact, power,  normal throughout.no focal deficits noted.   Assessment & Plan  Depression with anxiety Uncontrolled, needs re evaluation and review of management by Psychiatry  Diabetes mellitus type 2 in obese (HCC) Controlled, no change in medication [name] is reminded of the importance of commitment to daily physical activity for 30 minutes or more, as able and the need to limit carbohydrate intake to 30 to 60 grams per meal to help with blood sugar control.   The need to take medication as prescribed, test blood sugar as directed, and to call between visits if there is a concern that blood sugar is uncontrolled is also discussed.   [name] is reminded of the importance of daily foot exam, annual eye examination, and good blood sugar, blood pressure and cholesterol control.  Diabetic Labs Latest Ref Rng & Units 03/18/2019 02/26/2019 10/16/2018 06/12/2018 02/06/2018  HbA1c <5.7 % of total Hgb - 6.5(H) 5.8(H) 7.8(H) 7.4(H)  Microalbumin mg/dL - - - - 5.3  Micro/Creat Ratio <30 mcg/mg creat - - - - 26  Chol <200 mg/dL 134 - - 160 -  HDL > OR = 40 mg/dL 33(L) - - 43 -  Calc LDL mg/dL (calc) 82 - - 95 -  Triglycerides <150 mg/dL 93 - - 121 -  Creatinine 0.70 - 1.33 mg/dL - 1.14 1.18 1.24 1.13   BP/Weight 03/18/2019 01/15/2019 10/16/2018 06/19/2018 05/23/2018 03/14/2018 12/24/2374  Systolic BP 283  140 - 128 128 134 128  Diastolic BP 74 78 - 74 80 86 80  Wt. (Lbs) 196 200 203 209 206.12 205 201.08  BMI 29.8 30.41 30.87 31.78 31.34 31.17 30.57  Some encounter information is confidential and restricted. Go to Review Flowsheets activity to see all data.   Foot/eye exam completion dates 03/18/2019 04/18/2017  Foot Form Completion Done Done        Essential hypertension Controlled, no change in medication DASH diet and commitment to daily physical activity for a minimum of 30 minutes discussed and encouraged,  as a part of hypertension management. The importance of attaining a healthy weight is also discussed.  BP/Weight 03/18/2019 01/15/2019 10/16/2018 06/19/2018 05/23/2018 03/14/2018 12/27/2017  Systolic BP 122 140 - 128 128 160 128  Diastolic BP 74 78 - 74 80 86 80  Wt. (Lbs) 196 200 203 209 206.12 205 201.08  BMI 29.8 30.41 30.87 31.78 31.34 31.17 30.57  Some encounter information is confidential and restricted. Go to Review Flowsheets activity to see all data.       Insomnia Sleep hygiene reviewed and written information offered also. Prescription sent for  medication needed.   Mixed hyperlipidemia Hyperlipidemia:Low fat diet discussed and encouraged.   Lipid Panel  Lab Results  Component Value Date   CHOL 134 03/18/2019   HDL 33 (L) 03/18/2019   LDLCALC 82 03/18/2019   TRIG 93 03/18/2019   CHOLHDL 4.1 03/18/2019   Controlled, no change in medication Needs to increase exercise to improve HDL    Overweight (BMI 25.0-29.9)  Patient re-educated about  the importance of commitment to a  minimum of 150 minutes of exercise per week as able.  The importance of healthy food choices with portion control discussed, as well as eating regularly and within a 12 hour window most days. The need to choose "clean , green" food 50 to 75% of the time is discussed, as well as to make water the primary drink and set a goal of 64 ounces water daily.    Weight /BMI 03/18/2019 01/15/2019 10/16/2018  WEIGHT 196 lb 200 lb 203 lb  HEIGHT 5\' 8"  5\' 8"  5\' 8"   BMI 29.8 kg/m2 30.41 kg/m2 30.87 kg/m2  Some encounter information is confidential and restricted. Go to Review Flowsheets activity to see all data.      Plantar fasciitis Short course ibuprofen and pt to ice feet twice daily

## 2019-03-18 NOTE — Assessment & Plan Note (Signed)
Controlled, no change in medication John [name] is reminded of the importance of commitment to daily physical activity for 30 minutes or more, as able and the need to limit carbohydrate intake to 30 to 60 grams per meal to help with blood sugar control.   The need to take medication as prescribed, test blood sugar as directed, and to call between visits if there is a concern that blood sugar is uncontrolled is also discussed.   John [name] is reminded of the importance of daily foot exam, annual eye examination, and good blood sugar, blood pressure and cholesterol control.  Diabetic Labs Latest Ref Rng & Units 03/18/2019 02/26/2019 10/16/2018 06/12/2018 02/06/2018  HbA1c <5.7 % of total Hgb - 6.5(H) 5.8(H) 7.8(H) 7.4(H)  Microalbumin mg/dL - - - - 5.3  Micro/Creat Ratio <30 mcg/mg creat - - - - 26  Chol <200 mg/dL 134 - - 160 -  HDL > OR = 40 mg/dL 33(L) - - 43 -  Calc LDL mg/dL (calc) 82 - - 95 -  Triglycerides <150 mg/dL 93 - - 121 -  Creatinine 0.70 - 1.33 mg/dL - 1.14 1.18 1.24 1.13   BP/Weight 03/18/2019 01/15/2019 10/16/2018 06/19/2018 05/23/2018 03/14/2018 11/24/3014  Systolic BP 010 932 - 355 732 202 542  Diastolic BP 74 78 - 74 80 86 80  Wt. (Lbs) 196 200 203 209 206.12 205 201.08  BMI 29.8 30.41 30.87 31.78 31.34 31.17 30.57  Some encounter information is confidential and restricted. Go to Review Flowsheets activity to see all data.   Foot/eye exam completion dates 03/18/2019 04/18/2017  Foot Form Completion Done Done

## 2019-03-18 NOTE — Assessment & Plan Note (Signed)
Uncontrolled, needs re evaluation and review of management by Psychiatry

## 2019-03-18 NOTE — Assessment & Plan Note (Signed)
  Patient re-educated about  the importance of commitment to a  minimum of 150 minutes of exercise per week as able.  The importance of healthy food choices with portion control discussed, as well as eating regularly and within a 12 hour window most days. The need to choose "clean , green" food 50 to 75% of the time is discussed, as well as to make water the primary drink and set a goal of 64 ounces water daily.    Weight /BMI 03/18/2019 01/15/2019 10/16/2018  WEIGHT 196 lb 200 lb 203 lb  HEIGHT 5\' 8"  5\' 8"  5\' 8"   BMI 29.8 kg/m2 30.41 kg/m2 30.87 kg/m2  Some encounter information is confidential and restricted. Go to Review Flowsheets activity to see all data.

## 2019-03-19 LAB — LIPID PANEL
Cholesterol: 134 mg/dL (ref <200)
HDL: 33 mg/dL — ABNORMAL LOW (ref >=40)
LDL Cholesterol (Calc): 82 mg/dL (calc)
Non-HDL Cholesterol (Calc): 101 mg/dL (calc) (ref <130)
Total CHOL/HDL Ratio: 4.1 (calc) (ref <5.0)
Triglycerides: 93 mg/dL (ref <150)

## 2019-03-19 LAB — CBC
HCT: 38.6 % (ref 38.5–50.0)
Hemoglobin: 13.1 g/dL — ABNORMAL LOW (ref 13.2–17.1)
MCH: 30.8 pg (ref 27.0–33.0)
MCHC: 33.9 g/dL (ref 32.0–36.0)
MCV: 90.8 fL (ref 80.0–100.0)
MPV: 10.7 fL (ref 7.5–12.5)
Platelets: 368 10*3/uL (ref 140–400)
RBC: 4.25 10*6/uL (ref 4.20–5.80)
RDW: 12.1 % (ref 11.0–15.0)
WBC: 7.6 10*3/uL (ref 3.8–10.8)

## 2019-03-19 LAB — MICROALBUMIN / CREATININE URINE RATIO
Creatinine, Urine: 511 mg/dL — ABNORMAL HIGH (ref 20–320)
Microalb Creat Ratio: 27 mcg/mg creat (ref <30)
Microalb, Ur: 13.8 mg/dL

## 2019-03-19 LAB — TSH: TSH: 2.84 mIU/L (ref 0.40–4.50)

## 2019-03-19 LAB — PSA: PSA: 0.9 ng/mL (ref <=4.0)

## 2019-03-20 ENCOUNTER — Ambulatory Visit (INDEPENDENT_AMBULATORY_CARE_PROVIDER_SITE_OTHER): Payer: Medicare Other | Admitting: Psychiatry

## 2019-03-20 ENCOUNTER — Other Ambulatory Visit: Payer: Self-pay

## 2019-03-20 ENCOUNTER — Encounter (HOSPITAL_COMMUNITY): Payer: Self-pay | Admitting: Psychiatry

## 2019-03-20 DIAGNOSIS — F418 Other specified anxiety disorders: Secondary | ICD-10-CM | POA: Diagnosis not present

## 2019-03-20 DIAGNOSIS — F322 Major depressive disorder, single episode, severe without psychotic features: Secondary | ICD-10-CM

## 2019-03-20 MED ORDER — FLUOXETINE HCL 40 MG PO CAPS: 40.0000 mg | ORAL_CAPSULE | Freq: Every day | ORAL | 2 refills | Status: DC

## 2019-03-20 MED ORDER — FLUOXETINE HCL 40 MG PO CAPS: 40.0000 mg | ORAL_CAPSULE | Freq: Every day | ORAL | 5 refills | Status: DC

## 2019-03-20 MED ORDER — TRAZODONE HCL 100 MG PO TABS: 100.0000 mg | ORAL_TABLET | Freq: Every day | ORAL | 2 refills | Status: DC

## 2019-03-20 MED ORDER — ALPRAZOLAM 1 MG PO TABS: ORAL_TABLET | ORAL | 2 refills | Status: DC
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
## 2019-04-05 ENCOUNTER — Other Ambulatory Visit: Payer: Self-pay | Admitting: "Endocrinology

## 2019-04-15 ENCOUNTER — Other Ambulatory Visit: Payer: Self-pay

## 2019-04-15 ENCOUNTER — Ambulatory Visit (HOSPITAL_COMMUNITY): Payer: Medicare Other | Admitting: Licensed Clinical Social Worker

## 2019-04-18 ENCOUNTER — Other Ambulatory Visit: Payer: Self-pay

## 2019-04-18 ENCOUNTER — Ambulatory Visit (HOSPITAL_COMMUNITY): Payer: Medicare Other | Admitting: Psychiatry

## 2019-05-02 ENCOUNTER — Telehealth: Payer: Self-pay

## 2019-05-02 NOTE — Telephone Encounter (Signed)
Patient called stating he had lost his bottle of Zofran. Explained to the patient he doesn't need this unless he is vomiting or nauseous. He verbalized understanding.

## 2019-05-02 NOTE — Telephone Encounter (Signed)
Pt is calling with medication concerns, please call

## 2019-05-08 ENCOUNTER — Telehealth: Payer: Self-pay

## 2019-05-08 NOTE — Telephone Encounter (Signed)
Please call patient about his medicine.  Has lots of questions.

## 2019-05-14 NOTE — Telephone Encounter (Signed)
Patient confused about his meds, will come tomorrow about 9:40 to bring meds

## 2019-05-15 ENCOUNTER — Other Ambulatory Visit: Payer: Self-pay

## 2019-05-15 ENCOUNTER — Ambulatory Visit: Payer: Medicare Other

## 2019-05-15 DIAGNOSIS — Z7189 Other specified counseling: Secondary | ICD-10-CM

## 2019-05-15 MED ORDER — ATORVASTATIN CALCIUM 20 MG PO TABS: 20.0000 mg | ORAL_TABLET | Freq: Every day | ORAL | 3 refills | Status: DC

## 2019-05-15 NOTE — Progress Notes (Signed)
Patient came in today to have someone go over his medications with him. I reviewed each bottle of medication with him. Advised him which bottles he no longer needed to take and could dispose of. Refilled his atorvastatin 20mg . Patient verbalized understanding of everything we covered today. Made sure patient understood directions of each medications and how many times to take them per day and how much with verbal understanding.

## 2019-05-20 ENCOUNTER — Encounter (HOSPITAL_COMMUNITY): Payer: Self-pay | Admitting: Psychiatry

## 2019-05-20 ENCOUNTER — Ambulatory Visit (INDEPENDENT_AMBULATORY_CARE_PROVIDER_SITE_OTHER): Payer: Medicare Other | Admitting: Psychiatry

## 2019-05-20 ENCOUNTER — Other Ambulatory Visit: Payer: Self-pay

## 2019-05-20 DIAGNOSIS — F418 Other specified anxiety disorders: Secondary | ICD-10-CM | POA: Diagnosis not present

## 2019-05-20 MED ORDER — FLUOXETINE HCL 40 MG PO CAPS: 40.0000 mg | ORAL_CAPSULE | Freq: Every day | ORAL | 2 refills | Status: DC

## 2019-05-20 MED ORDER — TRAZODONE HCL 100 MG PO TABS: 100.0000 mg | ORAL_TABLET | Freq: Every day | ORAL | 2 refills | Status: DC

## 2019-05-20 MED ORDER — ALPRAZOLAM 1 MG PO TABS: ORAL_TABLET | ORAL | 2 refills | Status: DC

## 2019-05-20 NOTE — Progress Notes (Signed)
Virtual Visit via Telephone Note  I connected with John [name] on 05/20/19 at  9:00 AM EST by telephone and verified that I am speaking with the correct person using two identifiers.   I discussed the limitations, risks, security and privacy concerns of performing an evaluation and management service by telephone and the availability of in person appointments. I also discussed with the patient that there may be a patient responsible charge related to this service. The patient expressed understanding and agreed to proceed.    I discussed the assessment and treatment plan with the patient. The patient was provided an opportunity to ask questions and all were answered. The patient agreed with the plan and demonstrated an understanding of the instructions.   The patient was advised to call back or seek an in-person evaluation if the symptoms worsen or if the condition fails to improve as anticipated.  I provided 15 minutes of non-face-to-face time during this encounter.   Levonne Spiller, MD  [facility] MD/PA/NP OP Progress Note  05/20/2019 9:13 AM John [name]  MRN:  [id number]  Chief Complaint:  Chief Complaint    Depression; Anxiety; Follow-up     HPI: This patient is a 58 year old married black male who lives with his wife in [place].  He had 1 son who died at age 81 and 51 in a motor vehicle accident.  He is currently on disability  The patient returns after 3 months.  Last time he was quite depressed.  He tells me that he was not even able to get out of bed had no interest in doing anything and his energy was low.  This was particularly because his mother died in 09/15/2022 and having to deal with the pandemic and taking care of his ill wife.  I did increase his Prozac to 40 mg it seems to have helped.  He states he has been getting up cleaning the house taking care of his wife as best he can.  He has been able to get out fishing a few times.  He states that he has several friends from  church who are being supportive.  We talked about counseling but he declined.  The trazodone at 100 mg tends to give him nightmares so he breaks it in half and he is sleeping well.  The Xanax also continues to help his anxiety.  He denies suicidal ideation.   Visit Diagnosis:    ICD-10-CM   1. Depression with anxiety  F41.8 FLUoxetine (PROZAC) 40 MG capsule    Past Psychiatric History:none  Past Medical History:  Past Medical History:  Diagnosis Date  . Anxiety   . Depression 09-14-05  . Diabetes mellitus without complication ([place])   . Hyperlipidemia   . Hypertension   . Hypogonadism male   . Obesity (BMI 30.0-34.9) 03/01/2017    Past Surgical History:  Procedure Laterality Date  . COLONOSCOPY  01/20/2011   Procedure: COLONOSCOPY;  Surgeon: Dorothyann Peng, MD;  Location: [facility];  Service: Endoscopy;  Laterality: N/A;  . HERNIA REPAIR     ventral hernia repair     Family Psychiatric History: see below  Family History:  Family History  Problem Relation Age of Onset  . Heart failure Mother        living   . Hypertension Mother   . Hyperlipidemia Mother   . Heart disease Mother   . Hypertension Sister   . Hypertension Brother   . Colon cancer Neg Hx  Social History:  Social History   Socioeconomic History  . Marital status: Married    Spouse name: Not on file  . Number of children: 1  . Years of education: Not on file  . Highest education level: Not on file  Occupational History  . Occupation: Financial risk analyst  . Financial resource strain: Not on file  . Food insecurity    Worry: Not on file    Inability: Not on file  . Transportation needs    Medical: Not on file    Non-medical: Not on file  Tobacco Use  . Smoking status: Never Smoker  . Smokeless tobacco: Never Used  Substance and Sexual Activity  . Alcohol use: Yes    Alcohol/week: 1.0 standard drinks    Types: 1 Cans of beer per week    Comment: occasional beer socially.   . Drug  use: No  . Sexual activity: Yes  Lifestyle  . Physical activity    Days per week: Not on file    Minutes per session: Not on file  . Stress: Not on file  Relationships  . Social Herbalist on phone: Not on file    Gets together: Not on file    Attends religious service: Not on file    Active member of club or organization: Not on file    Attends meetings of clubs or organizations: Not on file    Relationship status: Not on file  Other Topics Concern  . Not on file  Social History Narrative  . Not on file    Allergies:  Allergies  Allergen Reactions  . Januvia [Sitagliptin] Nausea And Vomiting  . Metformin And Related Nausea Only  . Poison Oak Extract [Poison Oak Extract] Dermatitis    Metabolic Disorder Labs: Lab Results  Component Value Date   HGBA1C 6.5 (H) 02/26/2019   MPG 140 02/26/2019   MPG 120 10/16/2018   No results found for: PROLACTIN Lab Results  Component Value Date   CHOL 134 03/18/2019   TRIG 93 03/18/2019   HDL 33 (L) 03/18/2019   CHOLHDL 4.1 03/18/2019   VLDL 64 (H) 12/30/2016   LDLCALC 82 03/18/2019   LDLCALC 95 06/12/2018   Lab Results  Component Value Date   TSH 2.84 03/18/2019   TSH 2.35 01/26/2016    Therapeutic Level Labs: No results found for: LITHIUM No results found for: VALPROATE No components found for:  CBMZ  Current Medications: Current Outpatient Medications  Medication Sig Dispense Refill  . ACCU-CHEK SOFTCLIX LANCETS lancets 100 each by Other route 4 (four) times daily. Use as instructed 100 each 5  . ALPRAZolam (XANAX) 1 MG tablet TAKE 1 TABLET BY MOUTH ONCE DAILY AS NEEDED FOR ANXIETY 30 tablet 2  . atorvastatin (LIPITOR) 20 MG tablet Take 1 tablet (20 mg total) by mouth daily. 30 tablet 3  . blood glucose meter kit and supplies KIT Dispense based on patient and insurance preference. Use up to four times daily as directed. (FOR ICD-9 250.00, 250.01). 1 each 0  . Blood Glucose Monitoring Suppl (ACCU-CHEK  AVIVA) device 1 each by Other route 2 (two) times daily. Use as instructed bid. E11.65 1 each 0  . FLUoxetine (PROZAC) 40 MG capsule Take 1 capsule (40 mg total) by mouth daily. 30 capsule 2  . glucose blood (ACCU-CHEK AVIVA) test strip Use to test blood glucose 4 times a day. E11.65 150 each 5  . ibuprofen (ADVIL) 800 MG tablet Take  one tablet by mouth three times daily for 5 days only, for bilateral foot pain 30 tablet 0  . Insulin Glargine (LANTUS SOLOSTAR) 100 UNIT/ML Solostar Pen INJECT 60 UNITS SUBCUTANEOUSLY AT BEDTIME 60 mL 0  . lisinopril-hydrochlorothiazide (ZESTORETIC) 10-12.5 MG tablet Take 1 tablet by mouth once daily 90 tablet 0  . metFORMIN (GLUCOPHAGE) 500 MG tablet Take 1/2 (one-half) tablet by mouth twice daily 90 tablet 0  . ondansetron (ZOFRAN) 4 MG tablet Take 1 tablet (4 mg total) by mouth every 8 (eight) hours as needed for nausea or vomiting. 15 tablet 0  . traZODone (DESYREL) 100 MG tablet Take 1 tablet (100 mg total) by mouth at bedtime. 30 tablet 2   No current facility-administered medications for this visit.      Musculoskeletal: Strength & Muscle Tone: within normal limits Gait & Station: normal Patient leans: N/A  Psychiatric Specialty Exam: Review of Systems  All other systems reviewed and are negative.   There were no vitals taken for this visit.There is no height or weight on file to calculate BMI.  General Appearance: NA  Eye Contact:  NA  Speech:  Clear and Coherent  Volume:  Normal  Mood:  Euthymic  Affect:  NA  Thought Process:  Goal Directed  Orientation:  Full (Time, Place, and Person)  Thought Content: WDL   Suicidal Thoughts:  No  Homicidal Thoughts:  No  Memory:  Immediate;   Fair Recent;   Poor Remote;   Poor  Judgement:  Fair  Insight:  Shallow  Psychomotor Activity:  Normal  Concentration:  Concentration: Fair and Attention Span: Fair  Recall:  [employer] of Knowledge: Fair  Language: Good  Akathisia:  No  Handed:  Right   AIMS (if indicated): not done  Assets:  Communication Skills Desire for Improvement Resilience Social Support Talents/Skills  ADL's:  Intact  Cognition: Impaired,  Mild  Sleep:  Good   Screenings: PHQ2-9     Office Visit from 03/18/2019 in [facility] Office Visit from 01/15/2019 in [facility] Office Visit from 10/16/2018 in [facility] Visit from 05/23/2018 in [facility] Visit from 12/27/2017 in [facility]  PHQ-2 Total Score  6  6  0  2  4  PHQ-9 Total Score  23  22  -  7  14       Assessment and Plan: This patient is a 58 year old male with a history of depression anxiety and mild cognitive impairment.  He is doing better on the increased dosage of Prozac so he will remain at 40 mg daily.  He will continue trazodone 50 to 100 mg at bedtime for sleep and Xanax 1 mg daily for anxiety.  He will return to see me in 3 months   Levonne Spiller, MD 05/20/2019, 9:13 AM

## 2019-05-28 ENCOUNTER — Encounter: Payer: Self-pay | Admitting: Family Medicine

## 2019-05-28 ENCOUNTER — Other Ambulatory Visit: Payer: Self-pay

## 2019-05-28 ENCOUNTER — Ambulatory Visit (INDEPENDENT_AMBULATORY_CARE_PROVIDER_SITE_OTHER): Payer: Medicare Other | Admitting: Family Medicine

## 2019-05-28 VITALS — BP 122/74 | HR 89 | Resp 15 | Ht 68.0 in | Wt 196.0 lb

## 2019-05-28 DIAGNOSIS — Z0000 Encounter for general adult medical examination without abnormal findings: Secondary | ICD-10-CM | POA: Diagnosis not present

## 2019-05-28 NOTE — Patient Instructions (Addendum)
John [name] , Thank you for taking time to come for your Medicare Wellness Visit. I appreciate your ongoing commitment to your health goals. Please review the following plan we discussed and let me know if I can assist you in the future.   Please continue to practice social distancing to keep you, your family, and our community safe.  If you must go out, please wear a Mask and practice good handwashing.  We hope that you have a happy, safe, and healthy Holiday Season.  Screening recommendations/referrals: Colonoscopy: Due 2022 Recommended yearly ophthalmology/optometry visit for glaucoma screening and checkup Recommended yearly dental visit for hygiene and checkup  Vaccinations: Influenza vaccine: Up to date Pneumococcal vaccine: Up to date Tdap vaccine: Up to date Shingles vaccine: check coverage with pharmacy  Advanced directives:  Please let us know if you would like to discuss this in the future.  Conditions/risks identified: Fall  Next appointment: 08/19/2019   Preventive Care 40-64 Years, Male Preventive care refers to lifestyle choices and visits with your health care provider that can promote health and wellness. What does preventive care include?  A yearly physical exam. This is also called an annual well check.  Dental exams once or twice a year.  Routine eye exams. Ask your health care provider how often you should have your eyes checked.  Personal lifestyle choices, including:  Daily care of your teeth and gums.  Regular physical activity.  Eating a healthy diet.  Avoiding tobacco and drug use.  Limiting alcohol use.  Practicing safe sex.  Taking low-dose aspirin every day starting at age 70. What happens during an annual well check? The services and screenings done by your health care provider during your annual well check will depend on your age, overall health, lifestyle risk factors, and family history of disease. Counseling  Your health care  provider may ask you questions about your:  Alcohol use.  Tobacco use.  Drug use.  Emotional well-being.  Home and relationship well-being.  Sexual activity.  Eating habits.  Work and work Astronomer. Screening  You may have the following tests or measurements:  Height, weight, and BMI.  Blood pressure.  Lipid and cholesterol levels. These may be checked every 5 years, or more frequently if you are over 11 years old.  Skin check.  Lung cancer screening. You may have this screening every year starting at age 52 if you have a 30-pack-year history of smoking and currently smoke or have quit within the past 15 years.  Fecal occult blood test (FOBT) of the stool. You may have this test every year starting at age 47.  Flexible sigmoidoscopy or colonoscopy. You may have a sigmoidoscopy every 5 years or a colonoscopy every 10 years starting at age 25.  Prostate cancer screening. Recommendations will vary depending on your family history and other risks.  Hepatitis C blood test.  Hepatitis B blood test.  Sexually transmitted disease (STD) testing.  Diabetes screening. This is done by checking your blood sugar (glucose) after you have not eaten for a while (fasting). You may have this done every 1-3 years. Discuss your test results, treatment options, and if necessary, the need for more tests with your health care provider. Vaccines  Your health care provider may recommend certain vaccines, such as:  Influenza vaccine. This is recommended every year.  Tetanus, diphtheria, and acellular pertussis (Tdap, Td) vaccine. You may need a Td booster every 10 years.  Zoster vaccine. You may need this after age 43.  Pneumococcal 13-valent conjugate (PCV13) vaccine. You may need this if you have certain conditions and have not been vaccinated.  Pneumococcal polysaccharide (PPSV23) vaccine. You may need one or two doses if you smoke cigarettes or if you have certain conditions. Talk  to your health care provider about which screenings and vaccines you need and how often you need them. This information is not intended to replace advice given to you by your health care provider. Make sure you discuss any questions you have with your health care provider. Document Released: 07/03/2015 Document Revised: 02/24/2016 Document Reviewed: 04/07/2015 Elsevier Interactive Patient Education  2017 [place] Prevention in the Home Falls can cause injuries. They can happen to people of all ages. There are many things you can do to make your home safe and to help prevent falls. What can I do on the outside of my home?  Regularly fix the edges of walkways and driveways and fix any cracks.  Remove anything that might make you trip as you walk through a door, such as a raised step or threshold.  Trim any bushes or trees on the path to your home.  Use bright outdoor lighting.  Clear any walking paths of anything that might make someone trip, such as rocks or tools.  Regularly check to see if handrails are loose or broken. Make sure that both sides of any steps have handrails.  Any raised decks and porches should have guardrails on the edges.  Have any leaves, snow, or ice cleared regularly.  Use sand or salt on walking paths during winter.  Clean up any spills in your garage right away. This includes oil or grease spills. What can I do in the bathroom?  Use night lights.  Install grab bars by the toilet and in the tub and shower. Do not use towel bars as grab bars.  Use non-skid mats or decals in the tub or shower.  If you need to sit down in the shower, use a plastic, non-slip stool.  Keep the floor dry. Clean up any water that spills on the floor as soon as it happens.  Remove soap buildup in the tub or shower regularly.  Attach bath mats securely with double-sided non-slip rug tape.  Do not have throw rugs and other things on the floor that can make you trip.  What can I do in the bedroom?  Use night lights.  Make sure that you have a light by your bed that is easy to reach.  Do not use any sheets or blankets that are too big for your bed. They should not hang down onto the floor.  Have a firm chair that has side arms. You can use this for support while you get dressed.  Do not have throw rugs and other things on the floor that can make you trip. What can I do in the kitchen?  Clean up any spills right away.  Avoid walking on wet floors.  Keep items that you use a lot in easy-to-reach places.  If you need to reach something above you, use a strong step stool that has a grab bar.  Keep electrical cords out of the way.  Do not use floor polish or wax that makes floors slippery. If you must use wax, use non-skid floor wax.  Do not have throw rugs and other things on the floor that can make you trip. What can I do with my stairs?  Do not leave any items on the  stairs.  Make sure that there are handrails on both sides of the stairs and use them. Fix handrails that are broken or loose. Make sure that handrails are as long as the stairways.  Check any carpeting to make sure that it is firmly attached to the stairs. Fix any carpet that is loose or worn.  Avoid having throw rugs at the top or bottom of the stairs. If you do have throw rugs, attach them to the floor with carpet tape.  Make sure that you have a light switch at the top of the stairs and the bottom of the stairs. If you do not have them, ask someone to add them for you. What else can I do to help prevent falls?  Wear shoes that:  Do not have high heels.  Have rubber bottoms.  Are comfortable and fit you well.  Are closed at the toe. Do not wear sandals.  If you use a stepladder:  Make sure that it is fully opened. Do not climb a closed stepladder.  Make sure that both sides of the stepladder are locked into place.  Ask someone to hold it for you, if possible.   Clearly mark and make sure that you can see:  Any grab bars or handrails.  First and last steps.  Where the edge of each step is.  Use tools that help you move around (mobility aids) if they are needed. These include:  Canes.  Walkers.  Scooters.  Crutches.  Turn on the lights when you go into a dark area. Replace any light bulbs as soon as they burn out.  Set up your furniture so you have a clear path. Avoid moving your furniture around.  If any of your floors are uneven, fix them.  If there are any pets around you, be aware of where they are.  Review your medicines with your doctor. Some medicines can make you feel dizzy. This can increase your chance of falling. Ask your doctor what other things that you can do to help prevent falls. This information is not intended to replace advice given to you by your health care provider. Make sure you discuss any questions you have with your health care provider. Document Released: 04/02/2009 Document Revised: 11/12/2015 Document Reviewed: 07/11/2014 Elsevier Interactive Patient Education  2017 [employer].

## 2019-05-28 NOTE — Progress Notes (Signed)
Subjective:   John [name] is a 58 y.o. male who presents for Medicare Annual/Subsequent preventive examination.  Location of Patient: Home Location of Provider: Telehealth Consent was obtain for visit to be over via telehealth.  I verified that I am speaking with the correct person using two identifiers.    Review of Systems:    Cardiac Risk Factors include: advanced age (>63mn, >>80women);diabetes mellitus;dyslipidemia;hypertension;male gender    Objective:    Vitals: BP 122/74   Pulse 89   Resp 15   Ht 5' 8" (1.727 m)   Wt 196 lb (88.9 kg)   BMI 29.80 kg/m   Body mass index is 29.8 kg/m.  Advanced Directives 12/28/2016 09/18/2016 03/02/2016 01/05/2016 12/31/2015 12/31/2015 08/30/2015  Does Patient Have a Medical Advance Directive? _0  No No  Would patient like information on creating a medical advance directive? No - Patient declined No - Patient declined No - patient declined information No - patient declined information No - patient declined information No - patient declined information No - patient declined information  Pre-existing out of facility DNR order (yellow form or pink MOST form) - - - - - - -  Some encounter information is confidential and restricted. Go to Review Flowsheets activity to see all data.    Tobacco Social History   Tobacco Use  Smoking Status Never Smoker  Smokeless Tobacco Never Used     Counseling given: Yes   Clinical Intake:  Pre-visit preparation completed: Yes  Pain : No/denies pain Pain Score: 0-No pain     BMI - recorded: 29.8 Nutritional Status: BMI 25 -29 Overweight Nutritional Risks: None Diabetes: No  How often do you need to have someone help you when you read instructions, pamphlets, or other written materials from your doctor or pharmacy?: 4 - Often What is the last grade level you completed in school?: 9  Interpreter Needed?: No     Past Medical History:  Diagnosis Date  . Anxiety   . Depression  2007  . Diabetes mellitus without complication ([place]   . Hyperlipidemia   . Hypertension   . Hypogonadism male   . Obesity (BMI 30.0-34.9) 03/01/2017   Past Surgical History:  Procedure Laterality Date  . COLONOSCOPY  01/20/2011   Procedure: COLONOSCOPY;  Surgeon: SDorothyann Peng MD;  [facility];  Service: Endoscopy;  Laterality: N/A;  . HERNIA REPAIR     ventral hernia repair    Family History  Problem Relation Age of Onset  . Heart failure Mother        living   . Hypertension Mother   . Hyperlipidemia Mother   . Heart disease Mother   . Hypertension Sister   . Hypertension Brother   . Colon cancer Neg Hx    Social History   Socioeconomic History  . Marital status: Married    Spouse name: Not on file  . Number of children: 1  . Years of education: Not on file  . Highest education level: Not on file  Occupational History  . 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 Financial resource strain: Not on file  . Food insecurity    Worry: Not on file    Inability: Not on file  . Transportation needs    Medical: Not on file    Non-medical: Not on file  Tobacco Use  . Smoking status: Never Smoker  . Smokeless tobacco: Never Used  Substance and Sexual Activity  .  Alcohol use: Yes    Alcohol/week: 1.0 standard drinks    Types: 1 Cans of beer per week    Comment: occasional beer socially.   . Drug use: No  . Sexual activity: Yes  Lifestyle  . Physical activity    Days per week: Not on file    Minutes per session: Not on file  . Stress: Not on file  Relationships  . Social Herbalist on phone: Not on file    Gets together: Not on file    Attends religious service: Not on file    Active member of club or organization: Not on file    Attends meetings of clubs or organizations: Not on file    Relationship status: Not on file  Other Topics Concern  . Not on file  Social History Narrative  . Not on file    Outpatient Encounter Medications as  of 05/28/2019  Medication Sig  . ACCU-CHEK SOFTCLIX LANCETS lancets 100 each by Other route 4 (four) times daily. Use as instructed  . ALPRAZolam (XANAX) 1 MG tablet TAKE 1 TABLET BY MOUTH ONCE DAILY AS NEEDED FOR ANXIETY  . atorvastatin (LIPITOR) 20 MG tablet Take 1 tablet (20 mg total) by mouth daily.  . blood glucose meter kit and supplies KIT Dispense based on patient and insurance preference. Use up to four times daily as directed. (FOR ICD-9 250.00, 250.01).  . Blood Glucose Monitoring Suppl (ACCU-CHEK AVIVA) device 1 each by Other route 2 (two) times daily. Use as instructed bid. E11.65  . FLUoxetine (PROZAC) 40 MG capsule Take 1 capsule (40 mg total) by mouth daily.  [name] glucose blood (ACCU-CHEK AVIVA) test strip Use to test blood glucose 4 times a day. E11.65  . ibuprofen (ADVIL) 800 MG tablet Take one tablet by mouth three times daily for 5 days only, for bilateral foot pain  . Insulin Glargine (LANTUS SOLOSTAR) 100 UNIT/ML Solostar Pen INJECT 60 UNITS SUBCUTANEOUSLY AT BEDTIME  . lisinopril-hydrochlorothiazide (ZESTORETIC) 10-12.5 MG tablet Take 1 tablet by mouth once daily  . metFORMIN (GLUCOPHAGE) 500 MG tablet Take 1/2 (one-half) tablet by mouth twice daily  . ondansetron (ZOFRAN) 4 MG tablet Take 1 tablet (4 mg total) by mouth every 8 (eight) hours as needed for nausea or vomiting.  . traZODone (DESYREL) 100 MG tablet Take 1 tablet (100 mg total) by mouth at bedtime.   No facility-administered encounter medications on file as of 05/28/2019.     Activities of Daily Living In your present state of health, do you have any difficulty performing the following activities: 05/28/2019  Hearing? N  Vision? N  Difficulty concentrating or making decisions? N  Walking or climbing stairs? Y  Dressing or bathing? N  Doing errands, shopping? N  Preparing Food and eating ? N  Using the Toilet? N  In the past six months, have you accidently leaked urine? N  Do you have problems with loss of  bowel control? N  Managing your Medications? Y  Managing your Finances? Y  Housekeeping or managing your Housekeeping? N  Some recent data might be hidden    Patient Care Team: Fayrene Helper, MD as PCP - General (Family Medicine) Danie Binder, MD (Gastroenterology)   Assessment:   This is a routine wellness examination for Arye.  Exercise Activities and Dietary recommendations Current Exercise Habits: Home exercise routine, Type of exercise: walking;strength training/weights, Time (Minutes): 30, Frequency (Times/Week): 7, Weekly Exercise (Minutes/Week): 210, Intensity: Mild, Exercise limited by:  None identified  Goals   None     Fall Risk Fall Risk  05/28/2019 03/18/2019 01/15/2019 10/16/2018 05/23/2018  Falls in the past year? 0 0 0 0 1  Number falls in past yr: 0 0 0 - 0  Injury with Fall? 0 0 0 0 0   Is the patient's home free of loose throw rugs in walkways, pet beds, electrical cords, etc?   yes      Grab bars in the bathroom? yes      Handrails on the stairs?   yes      Adequate lighting?   yes     Depression Screen PHQ 2/9 Scores 05/28/2019 03/18/2019 03/18/2019 01/15/2019  PHQ - 2 Score 0 6 6 6  PHQ- 9 Score - 23 23 22    Cognitive Function     6CIT Screen 05/28/2019  What Year? 0 points  What month? 0 points  What time? 0 points  Count back from 20 0 points  Months in reverse 4 points  Repeat phrase 10 points  Total Score 14    Immunization History  Administered Date(s) Administered  . Influenza Whole 03/18/2010, 04/13/2011  . Influenza,inj,Quad PF,6+ Mos 05/20/2013, 05/08/2014, 06/11/2015, 04/18/2017, 05/23/2018, 03/18/2019  . Pneumococcal Polysaccharide-23 11/26/2013, 03/18/2019  . Td 09/15/2009    Qualifies for Shingles Vaccine?  Postponed- checking coverage  Screening Tests Health Maintenance  Topic Date Due  . OPHTHALMOLOGY EXAM  02/12/1970  . HEMOGLOBIN A1C  08/26/2019  . TETANUS/TDAP  09/16/2019  . FOOT EXAM  03/17/2020  .  COLONOSCOPY  01/19/2021  . INFLUENZA VACCINE  Completed  . PNEUMOCOCCAL POLYSACCHARIDE VACCINE AGE 2-64 HIGH RISK  Completed  . Hepatitis C Screening  Completed  . HIV Screening  Completed   Cancer Screenings: Lung: Low Dose CT Chest recommended if Age 55-80 years, 30 pack-year currently smoking OR have quit w/in 15years. Patient does not qualify. Colorectal:  Due 2022  Additional Screenings:   Hepatitis C Screening: completed      Plan:      1. Encounter for Medicare annual wellness exam  I have personally reviewed and noted the following in the patient's chart:   . Medical and social history . Use of alcohol, tobacco or illicit drugs  . Current medications and supplements . Functional ability and status . Nutritional status . Physical activity . Advanced directives . List of other physicians . Hospitalizations, surgeries, and ER visits in previous 12 months . Vitals . Screenings to include cognitive, depression, and falls . Referrals and appointments  In addition, I have reviewed and discussed with patient certain preventive protocols, quality metrics, and best practice recommendations. A written personalized care plan for preventive services as well as general preventive health recommendations were provided to patient.     I provided 20 minutes of non-face-to-face time during this encounter.   Hannah M Mills, NP  05/28/2019   

## 2019-05-29 ENCOUNTER — Ambulatory Visit (INDEPENDENT_AMBULATORY_CARE_PROVIDER_SITE_OTHER): Payer: Medicare Other | Admitting: Urology

## 2019-05-29 DIAGNOSIS — N476 Balanoposthitis: Secondary | ICD-10-CM | POA: Diagnosis not present

## 2019-05-29 DIAGNOSIS — N471 Phimosis: Secondary | ICD-10-CM

## 2019-05-31 ENCOUNTER — Other Ambulatory Visit: Payer: Self-pay | Admitting: Urology

## 2019-05-31 ENCOUNTER — Ambulatory Visit: Payer: Medicare Other | Admitting: Family Medicine

## 2019-06-07 ENCOUNTER — Telehealth: Payer: Self-pay | Admitting: Urology

## 2019-06-07 ENCOUNTER — Encounter (HOSPITAL_COMMUNITY): Admission: RE | Admit: 2019-06-07 | Payer: Medicare Other | Source: Ambulatory Visit

## 2019-06-07 ENCOUNTER — Other Ambulatory Visit: Payer: Self-pay

## 2019-06-07 ENCOUNTER — Other Ambulatory Visit (HOSPITAL_COMMUNITY)
Admission: RE | Admit: 2019-06-07 | Discharge: 2019-06-07 | Disposition: A | Discharge status: Home / Self Care | Payer: Medicare Other | Source: Ambulatory Visit | Attending: Urology | Admitting: Urology

## 2019-06-07 DIAGNOSIS — Z20828 Contact with and (suspected) exposure to other viral communicable diseases: Secondary | ICD-10-CM | POA: Insufficient documentation

## 2019-06-07 DIAGNOSIS — Z01812 Encounter for preprocedural laboratory examination: Secondary | ICD-10-CM | POA: Insufficient documentation

## 2019-06-07 LAB — SARS CORONAVIRUS 2 (TAT 6-24 HRS): SARS Coronavirus 2: NEGATIVE

## 2019-06-07 NOTE — Telephone Encounter (Signed)
Pre Op called, pt did not show for pre admission testing/screening that was scheduled for today 06/07/19

## 2019-06-10 ENCOUNTER — Ambulatory Visit (HOSPITAL_COMMUNITY): Payer: Medicare Other | Admitting: Anesthesiology

## 2019-06-10 ENCOUNTER — Encounter (HOSPITAL_COMMUNITY): Payer: Self-pay | Admitting: Urology

## 2019-06-10 ENCOUNTER — Encounter (HOSPITAL_COMMUNITY)
Admission: RE | Disposition: A | Discharge status: Home / Self Care | Payer: Self-pay | Source: Home / Self Care | Attending: Urology

## 2019-06-10 ENCOUNTER — Ambulatory Visit (HOSPITAL_COMMUNITY)
Admission: RE | Admit: 2019-06-10 | Discharge: 2019-06-10 | Disposition: A | Discharge status: Home / Self Care | Payer: Medicare Other | Attending: Urology | Admitting: Urology

## 2019-06-10 DIAGNOSIS — N477 Other inflammatory diseases of prepuce: Secondary | ICD-10-CM | POA: Diagnosis not present

## 2019-06-10 DIAGNOSIS — Z8249 Family history of ischemic heart disease and other diseases of the circulatory system: Secondary | ICD-10-CM | POA: Insufficient documentation

## 2019-06-10 DIAGNOSIS — Z6830 Body mass index (BMI) 30.0-30.9, adult: Secondary | ICD-10-CM | POA: Insufficient documentation

## 2019-06-10 DIAGNOSIS — Z888 Allergy status to other drugs, medicaments and biological substances status: Secondary | ICD-10-CM | POA: Diagnosis not present

## 2019-06-10 DIAGNOSIS — N476 Balanoposthitis: Secondary | ICD-10-CM | POA: Diagnosis not present

## 2019-06-10 DIAGNOSIS — E669 Obesity, unspecified: Secondary | ICD-10-CM | POA: Insufficient documentation

## 2019-06-10 DIAGNOSIS — I10 Essential (primary) hypertension: Secondary | ICD-10-CM | POA: Insufficient documentation

## 2019-06-10 DIAGNOSIS — N471 Phimosis: Secondary | ICD-10-CM | POA: Diagnosis not present

## 2019-06-10 DIAGNOSIS — E119 Type 2 diabetes mellitus without complications: Secondary | ICD-10-CM | POA: Insufficient documentation

## 2019-06-10 DIAGNOSIS — E785 Hyperlipidemia, unspecified: Secondary | ICD-10-CM | POA: Diagnosis not present

## 2019-06-10 HISTORY — PX: CIRCUMCISION: SHX1350

## 2019-06-10 LAB — GLUCOSE, CAPILLARY
Glucose-Capillary: 65 mg/dL — ABNORMAL LOW (ref 70–99)
Glucose-Capillary: 103 mg/dL — ABNORMAL HIGH (ref 70–99)
Glucose-Capillary: 76 mg/dL (ref 70–99)

## 2019-06-10 LAB — BASIC METABOLIC PANEL
Anion gap: 10 (ref 5–15)
BUN: 10 mg/dL (ref 6–20)
CO2: 29 mmol/L (ref 22–32)
Calcium: 9.5 mg/dL (ref 8.9–10.3)
Chloride: 99 mmol/L (ref 98–111)
Creatinine, Ser: 1.2 mg/dL (ref 0.61–1.24)
GFR calc Af Amer: >60 mL/min (ref >60)
GFR calc non Af Amer: >60 mL/min (ref >60)
Glucose, Bld: 83 mg/dL (ref 70–99)
Potassium: 3.7 mmol/L (ref 3.5–5.1)
Sodium: 138 mmol/L (ref 135–145)

## 2019-06-10 LAB — HEMOGLOBIN A1C
Hgb A1c MFr Bld: 5.6 % (ref 4.8–5.6)
Mean Plasma Glucose: 114.02 mg/dL

## 2019-06-10 SURGERY — CIRCUMCISION, ADULT
Anesthesia: General | Site: Penis

## 2019-06-10 MED ORDER — LACTATED RINGERS IV SOLN
INTRAVENOUS | Status: DC
  Administered 2019-06-10: 09:00:00 1000 mL via INTRAVENOUS

## 2019-06-10 MED ORDER — CEFAZOLIN SODIUM-DEXTROSE 2-4 GM/100ML-% IV SOLN
2.0000 g | INTRAVENOUS | Status: AC
  Administered 2019-06-10: 2 g via INTRAVENOUS
  Filled 2019-06-10: qty 100

## 2019-06-10 MED ORDER — BUPIVACAINE HCL (PF) 0.25 % IJ SOLN
INTRAMUSCULAR | Status: AC
  Filled 2019-06-10: qty 30

## 2019-06-10 MED ORDER — EPHEDRINE 5 MG/ML INJ
INTRAVENOUS | Status: AC
  Filled 2019-06-10: qty 10

## 2019-06-10 MED ORDER — HYDROCODONE-ACETAMINOPHEN 7.5-325 MG PO TABS: 1.0000 | ORAL_TABLET | Freq: Once | ORAL | Status: DC | PRN

## 2019-06-10 MED ORDER — SUCCINYLCHOLINE CHLORIDE 200 MG/10ML IV SOSY
PREFILLED_SYRINGE | INTRAVENOUS | Status: AC
  Filled 2019-06-10: qty 10

## 2019-06-10 MED ORDER — PROMETHAZINE HCL 25 MG/ML IJ SOLN: 6.2500 mg | INTRAMUSCULAR | Status: DC | PRN

## 2019-06-10 MED ORDER — DEXAMETHASONE SODIUM PHOSPHATE 4 MG/ML IJ SOLN
INTRAMUSCULAR | Status: DC | PRN
  Administered 2019-06-10: 10 mg via INTRAVENOUS

## 2019-06-10 MED ORDER — FENTANYL CITRATE (PF) 250 MCG/5ML IJ SOLN
INTRAMUSCULAR | Status: AC
  Filled 2019-06-10: qty 5

## 2019-06-10 MED ORDER — OXYCODONE-ACETAMINOPHEN 5-325 MG PO TABS: 1.0000 | ORAL_TABLET | ORAL | 0 refills | Status: DC | PRN

## 2019-06-10 MED ORDER — GLYCOPYRROLATE PF 0.2 MG/ML IJ SOSY
PREFILLED_SYRINGE | INTRAMUSCULAR | Status: AC
  Filled 2019-06-10: qty 1

## 2019-06-10 MED ORDER — PHENYLEPHRINE 40 MCG/ML (10ML) SYRINGE FOR IV PUSH (FOR BLOOD PRESSURE SUPPORT)
PREFILLED_SYRINGE | INTRAVENOUS | Status: AC
  Filled 2019-06-10: qty 10

## 2019-06-10 MED ORDER — DEXAMETHASONE SODIUM PHOSPHATE 10 MG/ML IJ SOLN
INTRAMUSCULAR | Status: AC
  Filled 2019-06-10: qty 1

## 2019-06-10 MED ORDER — HYDROMORPHONE HCL 1 MG/ML IJ SOLN: 0.2500 mg | INTRAMUSCULAR | Status: DC | PRN

## 2019-06-10 MED ORDER — PROPOFOL 10 MG/ML IV BOLUS
INTRAVENOUS | Status: AC
  Filled 2019-06-10: qty 40

## 2019-06-10 MED ORDER — FENTANYL CITRATE (PF) 100 MCG/2ML IJ SOLN
INTRAMUSCULAR | Status: DC | PRN
  Administered 2019-06-10 (×2): 50 ug via INTRAVENOUS

## 2019-06-10 MED ORDER — LIDOCAINE 2% (20 MG/ML) 5 ML SYRINGE
INTRAMUSCULAR | Status: AC
  Filled 2019-06-10: qty 5

## 2019-06-10 MED ORDER — LACTATED RINGERS IV SOLN: INTRAVENOUS | Status: DC | PRN

## 2019-06-10 MED ORDER — GLYCOPYRROLATE PF 0.2 MG/ML IJ SOSY
PREFILLED_SYRINGE | INTRAMUSCULAR | Status: DC | PRN
  Administered 2019-06-10: .2 mg via INTRAVENOUS

## 2019-06-10 MED ORDER — ARTIFICIAL TEARS OPHTHALMIC OINT
TOPICAL_OINTMENT | OPHTHALMIC | Status: AC
  Filled 2019-06-10: qty 3.5

## 2019-06-10 MED ORDER — LIDOCAINE HCL (CARDIAC) PF 100 MG/5ML IV SOSY
PREFILLED_SYRINGE | INTRAVENOUS | Status: DC | PRN
  Administered 2019-06-10: 60 mg via INTRAVENOUS

## 2019-06-10 MED ORDER — ONDANSETRON HCL 4 MG/2ML IJ SOLN
INTRAMUSCULAR | Status: DC | PRN
  Administered 2019-06-10: 4 mg via INTRAVENOUS

## 2019-06-10 MED ORDER — ONDANSETRON HCL 4 MG/2ML IJ SOLN
INTRAMUSCULAR | Status: AC
  Filled 2019-06-10: qty 2

## 2019-06-10 MED ORDER — PROPOFOL 10 MG/ML IV BOLUS
INTRAVENOUS | Status: AC
  Filled 2019-06-10: qty 20

## 2019-06-10 MED ORDER — 0.9 % SODIUM CHLORIDE (POUR BTL) OPTIME
TOPICAL | Status: DC | PRN
  Administered 2019-06-10: 1000 mL

## 2019-06-10 MED ORDER — BUPIVACAINE HCL (PF) 0.25 % IJ SOLN
INTRAMUSCULAR | Status: DC | PRN
  Administered 2019-06-10: 10 mL

## 2019-06-10 MED ORDER — MIDAZOLAM HCL 2 MG/2ML IJ SOLN: 0.5000 mg | Freq: Once | INTRAMUSCULAR | Status: DC | PRN

## 2019-06-10 MED ORDER — LIDOCAINE 2% (20 MG/ML) 5 ML SYRINGE
INTRAMUSCULAR | Status: AC
  Filled 2019-06-10: qty 10

## 2019-06-10 MED ORDER — PROPOFOL 10 MG/ML IV BOLUS
INTRAVENOUS | Status: DC | PRN
  Administered 2019-06-10: 230 mg via INTRAVENOUS

## 2019-06-10 SURGICAL SUPPLY — 25 items
BANDAGE COBAN STERILE 2 (GAUZE/BANDAGES/DRESSINGS) ×3 IMPLANT
BNDG CONFORM 2 STRL LF (GAUZE/BANDAGES/DRESSINGS) ×3 IMPLANT
COVER LIGHT HANDLE STERIS (MISCELLANEOUS) ×6 IMPLANT
COVER WAND RF STERILE (DRAPES) ×3 IMPLANT
DECANTER SPIKE VIAL GLASS SM (MISCELLANEOUS) ×3 IMPLANT
DRSG XEROFORM 1X8 (GAUZE/BANDAGES/DRESSINGS) ×3 IMPLANT
ELECT NEEDLE TIP 2.8 STRL (NEEDLE) ×3 IMPLANT
ELECT REM PT RETURN 9FT ADLT (ELECTROSURGICAL) ×3
ELECTRODE REM PT RTRN 9FT ADLT (ELECTROSURGICAL) ×1 IMPLANT
GAUZE SPONGE 4X4 12PLY STRL (GAUZE/BANDAGES/DRESSINGS) ×3 IMPLANT
GLOVE BIO SURGEON STRL SZ7 (GLOVE) ×3 IMPLANT
GLOVE BIO SURGEON STRL SZ8 (GLOVE) ×3 IMPLANT
GLOVE BIOGEL PI IND STRL 7.0 (GLOVE) ×2 IMPLANT
GLOVE BIOGEL PI INDICATOR 7.0 (GLOVE) ×4
GOWN STRL REUS W/ TWL XL LVL3 (GOWN DISPOSABLE) ×1 IMPLANT
GOWN STRL REUS W/TWL XL LVL3 (GOWN DISPOSABLE) ×2
KIT TURNOVER KIT A (KITS) ×6 IMPLANT
NEEDLE HYPO 25X1 1.5 SAFETY (NEEDLE) ×3 IMPLANT
NS IRRIG 1000ML POUR BTL (IV SOLUTION) ×3 IMPLANT
PACK MINOR (CUSTOM PROCEDURE TRAY) ×3 IMPLANT
PAD ARMBOARD 7.5X6 YLW CONV (MISCELLANEOUS) ×3 IMPLANT
SET BASIN LINEN APH (SET/KITS/TRAYS/PACK) ×3 IMPLANT
SUT VIC AB 4-0 SH 27 (SUTURE) ×4
SUT VIC AB 4-0 SH 27XBRD (SUTURE) ×2 IMPLANT
SYR CONTROL 10ML LL (SYRINGE) ×3 IMPLANT

## 2019-06-10 NOTE — Discharge Instructions (Signed)
Circumcision, Adult, Care After This sheet gives you information about how to care for yourself after your procedure. Your doctor may also give you more specific instructions. If you have problems or questions, contact your doctor. Follow these instructions at home: Cut care   Follow instructions from your doctor about how to take care of your cut from surgery (incision). Make sure you: ? Wash your hands with soap and water before you change your bandage (dressing). If you cannot use soap and water, use hand sanitizer. ? Change your bandage as told by your doctor. ? Leave stitches (sutures), skin glue, or skin tape (adhesive) strips in place. They may need to stay in place for 2 weeks or longer. If tape strips get loose and curl up, you may trim the loose edges. Do not remove tape strips completely unless your doctor says it is okay  Check your cut area every day for signs of infection. Check for: ? More redness, swelling, or pain. ? More fluid or blood. ? Warmth. ? Pus or a bad smell. Bathing  Do not take baths, swim, or use a hot tub until your doctor says it is okay. Ask your doctor if you can take showers. You may only be allowed to take sponge baths for bathing.  Do not get your cut area wet during the 24 hours after the procedure, or as long as told by your doctor.  When you can shower, do not rub the cut area. Gently pat it dry. General instructions  Avoid heavy lifting, contact sports, biking, or swimming until your doctor says it is okay.  Do not have sex until your doctor says it is okay.  Take or apply over-the-counter and prescription medicines only as told by your doctor.  Keep all follow-up visits as told by your doctor. This is important. Contact a doctor if:  Medicine does not help your pain.  You have more redness, swelling, or pain around your cut.  You have more fluid or blood coming from your cut.  Your cut feels warm to the touch.  You have pus or a bad  smell coming from your cut.  You have a fever. Get help right away if:  You cannot pee (urinate).  It hurts to pee.  Your pain is not helped by medicines.  There is redness, swelling, and soreness that spreads up the shaft of your penis, your thighs, or your lower belly (abdomen).  You have bleeding that does not stop when you press on it. Summary  Follow instructions from your doctor about how to take care of your cut from surgery (incision).  Check your cut area every day for signs of infection.  Do not have sex until your doctor says it is okay. This information is not intended to replace advice given to you by your health care provider. Make sure you discuss any questions you have with your health care provider. Document Released: 11/23/2007 Document Revised: 05/19/2017 Document Reviewed: 06/14/2016 Elsevier Patient Education  2020 [place].  PATIENT INSTRUCTIONS POST-ANESTHESIA  IMMEDIATELY FOLLOWING SURGERY:  Do not drive or operate machinery for the first twenty four hours after surgery.  Do not make any important decisions for twenty four hours after surgery or while taking narcotic pain medications or sedatives.  If you develop intractable nausea and vomiting or a severe headache please notify your doctor immediately.  FOLLOW-UP:  Please make an appointment with your surgeon as instructed. You do not need to follow up with anesthesia unless  specifically instructed to do so.  WOUND CARE INSTRUCTIONS (if applicable):  Keep a dry clean dressing on the anesthesia/puncture wound site if there is drainage.  Once the wound has quit draining you may leave it open to air.  Generally you should leave the bandage intact for twenty four hours unless there is drainage.  If the epidural site drains for more than 36-48 hours please call the anesthesia department.  QUESTIONS?:  Please feel free to call your physician or the hospital operator if you have any questions, and they will be  happy to assist you.

## 2019-06-10 NOTE — Transfer of Care (Signed)
Immediate Anesthesia Transfer of Care Note  Patient: John [name]  Procedure(s) Performed: CIRCUMCISION ADULT (N/A Penis)  Patient Location: PACU  Anesthesia Type:General  Level of Consciousness: awake, alert , oriented and patient cooperative  Airway & Oxygen Therapy: Patient Spontanous Breathing  Post-op Assessment: Report given to RN and Post -op Vital signs reviewed and stable  Post vital signs: Reviewed and stable  Last Vitals:  Vitals Value Taken Time  BP 148/93 06/10/19 1100  Temp    Pulse 65 06/10/19 1103  Resp 12 06/10/19 1103  SpO2 99 % 06/10/19 1103  Vitals shown include unvalidated device data.  Last Pain:  Vitals:   06/10/19 0853  TempSrc: Oral  PainSc: 0-No pain      Patients Stated Pain Goal: 6 (18/84/16 6063)  Complications: No apparent anesthesia complications

## 2019-06-10 NOTE — H&P (Signed)
Urology Admission H&P  Chief Complaint: phimosis  History of Present Illness: Mr John [name] is a 58yo with a hx of phimosis and balanoposthitis. He denies any LUTS. NO hematuria  Past Medical History:  Diagnosis Date  . Anxiety   . Depression 2007  . Diabetes mellitus without complication ([place])   . Hyperlipidemia   . Hypertension   . Hypogonadism male   . Obesity (BMI 30.0-34.9) 03/01/2017   Past Surgical History:  Procedure Laterality Date  . COLONOSCOPY  01/20/2011   Procedure: COLONOSCOPY;  Surgeon: Dorothyann Peng, MD;  [facility];  Service: Endoscopy;  Laterality: N/A;  . HERNIA REPAIR     ventral hernia repair     Home Medications:  Current Facility-Administered Medications  Medication Dose Route Frequency Provider Last Rate Last Admin  . ceFAZolin (ANCEF) IVPB 2g/100 mL premix  2 g Intravenous 30 min Pre-Op Allexus Ovens, Candee Furbish, MD      . lactated ringers infusion   Intravenous Continuous Lenice Llamas, MD 50 mL/hr at 06/10/19 0909 1,000 mL at 06/10/19 6962   Allergies:  Allergies  Allergen Reactions  . Januvia [Sitagliptin] Nausea And Vomiting  . Poison Oak Extract [Poison Oak Extract] Dermatitis    Family History  Problem Relation Age of Onset  . Heart failure Mother        living   . Hypertension Mother   . Hyperlipidemia Mother   . Heart disease Mother   . Hypertension Sister   . Hypertension Brother   . Colon cancer Neg Hx    Social History:  reports that he has never smoked. He has never used smokeless tobacco. He reports current alcohol use of about 1.0 standard drinks of alcohol per week. He reports that he does not use drugs.  Review of Systems  All other systems reviewed and are negative.   Physical Exam:  Vital signs in last 24 hours: Temp:  [98.8 F (37.1 C)] 98.8 F (37.1 C) (12/21 0853) Pulse Rate:  [85] 85 (12/21 0853) Resp:  [20] 20 (12/21 0853) BP: (121)/(68) 121/68 (12/21 0853) SpO2:  [99 %] 99 % (12/21 0853) Physical  Exam  Constitutional: He is oriented to person, place, and time. He appears well-developed and well-nourished.  HENT:  Head: Normocephalic and atraumatic.  Eyes: Pupils are equal, round, and reactive to light. EOM are normal.  Neck: No thyromegaly present.  Cardiovascular: Normal rate and regular rhythm.  Respiratory: Effort normal. No respiratory distress.  GI: Soft. He exhibits no distension.  Musculoskeletal:        General: No edema. Normal range of motion.     Cervical back: Normal range of motion.  Neurological: He is alert and oriented to person, place, and time.  Skin: Skin is warm and dry.  Psychiatric: He has a normal mood and affect. His behavior is normal. Judgment and thought content normal.    Laboratory Data:  Results for orders placed or performed during the hospital encounter of 06/10/19 (from the past 24 hour(s))  Basic metabolic panel     Status: None   Collection Time: 06/10/19  8:39 AM  Result Value Ref Range   Sodium 138 135 - 145 mmol/L   Potassium 3.7 3.5 - 5.1 mmol/L   Chloride 99 98 - 111 mmol/L   CO2 29 22 - 32 mmol/L   Glucose, Bld 83 70 - 99 mg/dL   BUN 10 6 - 20 mg/dL   Creatinine, Ser 1.20 0.61 - 1.24 mg/dL   Calcium  9.5 8.9 - 10.3 mg/dL   GFR calc non Af Amer >60 >60 mL/min   GFR calc Af Amer >60 >60 mL/min   Anion gap 10 5 - 15   Recent Results (from the past 240 hour(s))  SARS CORONAVIRUS 2 (TAT 6-24 HRS) Nasopharyngeal Nasopharyngeal Swab     Status: None   Collection Time: 06/07/19  6:59 AM   Specimen: Nasopharyngeal Swab  Result Value Ref Range Status   SARS Coronavirus 2 NEGATIVE NEGATIVE Final    Comment: (NOTE) SARS-CoV-2 target nucleic acids are NOT DETECTED. The SARS-CoV-2 RNA is generally detectable in upper and lower respiratory specimens during the acute phase of infection. Negative results do not preclude SARS-CoV-2 infection, do not rule out co-infections with other pathogens, and should not be used as the sole basis for  treatment or other patient management decisions. Negative results must be combined with clinical observations, patient history, and epidemiological information. The expected result is Negative. Fact Sheet for Patients: HairSlick.no Fact Sheet for Healthcare Providers: quierodirigir.com This test is not yet approved or cleared by the [place] FDA and  has been authorized for detection and/or diagnosis of SARS-CoV-2 by FDA under an Emergency Use Authorization (EUA). This EUA will remain  in effect (meaning this test can be used) for the duration of the COVID-19 declaration under Section 56 4(b)(1) of the Act, 21 U.S.C. section 360bbb-3(b)(1), unless the authorization is terminated or revoked sooner. Performed at [facility], [address], [place], [place] [address]    Creatinine: Recent Labs    06/10/19 0630  CREATININE 1.20   Baseline Creatinine: 1.2  Impression/Assessment:  58yo with phimosis  Plan:  The risks/benefits/alternatives to circumcision was explained to the patient and he understands and wishes to proceed with surgery  [name] 06/10/2019, 9:46 AM

## 2019-06-10 NOTE — Anesthesia Procedure Notes (Signed)
Procedure Name: LMA Insertion Date/Time: 06/10/2019 10:24 AM Performed by: Valyn Latchford A, CRNA Pre-anesthesia Checklist: Patient identified, Emergency Drugs available, Suction available, Patient being monitored and Timeout performed Patient Re-evaluated:Patient Re-evaluated prior to induction Oxygen Delivery Method: Circle system utilized Preoxygenation: Pre-oxygenation with 100% oxygen Induction Type: IV induction LMA: LMA inserted LMA Size: 5.0 Number of attempts: 1 Placement Confirmation: breath sounds checked- equal and bilateral and positive ETCO2 Tube secured with: Tape Dental Injury: Teeth and Oropharynx as per pre-operative assessment

## 2019-06-10 NOTE — Anesthesia Postprocedure Evaluation (Signed)
Anesthesia Post Note  Patient: John [name]  Procedure(s) Performed: CIRCUMCISION ADULT (N/A Penis)  Anesthesia Type: General Level of consciousness: awake and alert, oriented and patient cooperative Pain management: pain level controlled Vital Signs Assessment: post-procedure vital signs reviewed and stable Respiratory status: spontaneous breathing Cardiovascular status: stable Postop Assessment: no apparent nausea or vomiting Anesthetic complications: no     Last Vitals:  Vitals:   06/10/19 0853  BP: 121/68  Pulse: 85  Resp: 20  Temp: 37.1 C  SpO2: 99%    Last Pain:  Vitals:   06/10/19 0853  TempSrc: Oral  PainSc: 0-No pain                 Braiden Presutti A

## 2019-06-10 NOTE — Anesthesia Preprocedure Evaluation (Signed)
Anesthesia Evaluation  Patient identified by MRN, date of birth, ID band Patient awake    Reviewed: Allergy & Precautions, NPO status , Patient's Chart, lab work & pertinent test results  Airway Mallampati: III  TM Distance: >3 FB Neck ROM: Full    Dental no notable dental hx. (+) Edentulous Upper, Edentulous Lower   Pulmonary neg pulmonary ROS,    Pulmonary exam normal breath sounds clear to auscultation       Cardiovascular Exercise Tolerance: Good hypertension, Pt. on medications negative cardio ROS Normal cardiovascular examI Rhythm:Regular Rate:Normal     Neuro/Psych Anxiety Depression negative neurological ROS  negative psych ROS   GI/Hepatic negative GI ROS, Neg liver ROS,   Endo/Other  negative endocrine ROSdiabetes, Type 2, Oral Hypoglycemic Agents  Renal/GU negative Renal ROS  negative genitourinary   Musculoskeletal negative musculoskeletal ROS (+)   Abdominal   Peds negative pediatric ROS (+)  Hematology negative hematology ROS (+)   Anesthesia Other Findings   Reproductive/Obstetrics negative OB ROS                             Anesthesia Physical Anesthesia Plan  ASA: II  Anesthesia Plan: General   Post-op Pain Management:    Induction: Intravenous  PONV Risk Score and Plan:   Airway Management Planned: LMA and Oral ETT  Additional Equipment:   Intra-op Plan:   Post-operative Plan:   Informed Consent: I have reviewed the patients History and Physical, chart, labs and discussed the procedure including the risks, benefits and alternatives for the proposed anesthesia with the patient or authorized representative who has indicated his/her understanding and acceptance.     Dental advisory given  Plan Discussed with: CRNA  Anesthesia Plan Comments: (Plan Full PPE use  Plan GA (LMA) with GETA as needed d/w pt -WTP with same after Q&A)        Anesthesia  Quick Evaluation

## 2019-06-10 NOTE — Op Note (Signed)
Preoperative diagnosis: phimosis  Postoperative diagnosis: Same  Procedure: circumcision  Attending: Nicolette Bang, MD  Anesthesia: general  History of blood loss: Minimal  Antibiotics: ancef  Drains: none  Specimens: foreskin  Findings: dense phimotic ring. No masses/lesions on glans or foreskin.  Indications: Patient is a 58 year old male with a history of phimosis, difficulty urinating, and recurrent balanoprothitis  After discussing treatment options he decided to proceed with circumcision   Procedure in detail: Prior to procedure consent was obtained.  Patient was brought to the operating room and a brief timeout was done to ensure correct patient, correct procedure, correct site.  General anesthesia was administered and patient was placed in supine position.  His genitalia and abdomen was then prepped and draped in usual sterile fashion.  The phimotic ring was incised sharply and the foreskin was then retracted. An circumferential incision was made 1.5cm below the coronal ridge. We then pulled the foreskin over the glans and made a separate circumferential incision at the level of the coronal ridge. We then sharply incised the foreskin and then freed it from the dartos using electrocautery. We then sent the foreskin for pathology. Individual bleeders were then cauterized and good hemostasis was noted. We then reapproximated the penile skin to the subcoronal incision. We used interrupted 3-0 vicryl to close the incision. Once this was complete we applied a gauze bandage and this then concluded the procedure which was well tolerated by the patient.  Complications: None  Condition: Stable, extubated, transferred to PACU.  Plan: Patient is to be discharged home.  He is to followup in 2 weeks for a wound check

## 2019-06-11 ENCOUNTER — Other Ambulatory Visit: Payer: Self-pay

## 2019-06-11 ENCOUNTER — Emergency Department (HOSPITAL_COMMUNITY)
Admission: EM | Admit: 2019-06-11 | Discharge: 2019-06-11 | Disposition: A | Discharge status: Home / Self Care | Payer: Medicare Other | Attending: Emergency Medicine | Admitting: Emergency Medicine

## 2019-06-11 DIAGNOSIS — N99820 Postprocedural hemorrhage and hematoma of a genitourinary system organ or structure following a genitourinary system procedure: Secondary | ICD-10-CM | POA: Diagnosis not present

## 2019-06-11 DIAGNOSIS — I10 Essential (primary) hypertension: Secondary | ICD-10-CM | POA: Insufficient documentation

## 2019-06-11 DIAGNOSIS — E119 Type 2 diabetes mellitus without complications: Secondary | ICD-10-CM | POA: Insufficient documentation

## 2019-06-11 DIAGNOSIS — T819XXA Unspecified complication of procedure, initial encounter: Secondary | ICD-10-CM | POA: Insufficient documentation

## 2019-06-11 DIAGNOSIS — R58 Hemorrhage, not elsewhere classified: Secondary | ICD-10-CM

## 2019-06-11 DIAGNOSIS — Y733 Surgical instruments, materials and gastroenterology and urology devices (including sutures) associated with adverse incidents: Secondary | ICD-10-CM | POA: Diagnosis not present

## 2019-06-11 LAB — SURGICAL PATHOLOGY

## 2019-06-11 NOTE — ED Triage Notes (Addendum)
Pt had a vasectomy the 21st of December. Stated today around 3pm, he started having some minor bleeding. NAD. Bleeding controlled now. Pt stated "I believe it's stopped."

## 2019-06-11 NOTE — Discharge Instructions (Addendum)
You were evaluated in the Emergency Department and after careful evaluation, we did not find any emergent condition requiring admission or further testing in the hospital.  Your exam/testing today is overall reassuring.  A small amount of bleeding after surgery can occur.  Please keep your appointment with your urologist.  Please return to the Emergency Department if you experience any worsening of your condition.  We encourage you to follow up with a primary care provider.  Thank you for allowing [place] to be a part of your care.

## 2019-06-11 NOTE — ED Provider Notes (Signed)
[facility] Emergency Department Provider Note MRN:  [id number]  Arrival date & time: 06/11/19     Chief Complaint   Post-op Problem   History of Present Illness   John [name] is a 58 y.o. year-old male with a history of diabetes, hypertension presenting to the ED with chief complaint of postop problem.  Patient underwent circumcision yesterday.  Today had some bleeding from the surgical site of his penis.  Here for evaluation.  Denies any other symptoms.  Bleeding was mild, seems to have stopped.  Denies blood thinners, no dizziness, no falls, no trauma, no abdominal pain.  No testicular or penile pain.  Review of Systems  A complete 10 system review of systems was obtained and all systems are negative except as noted in the HPI and PMH.   Patient's Health History    Past Medical History:  Diagnosis Date  . Anxiety   . Depression 2007  . Diabetes mellitus without complication (HCC)   . Hyperlipidemia   . Hypertension   . Hypogonadism male   . Obesity (BMI 30.0-34.9) 03/01/2017    Past Surgical History:  Procedure Laterality Date  . CIRCUMCISION N/A 06/10/2019   Procedure: CIRCUMCISION ADULT;  Surgeon: Malen Gauze, MD;  Location: [facility] ORS;  Service: Urology;  Laterality: N/A;  30 MINS  . COLONOSCOPY  01/20/2011   Procedure: COLONOSCOPY;  Surgeon: Arlyce Harman, MD;  Location: [facility]O SU[facility];  Service: Endoscopy;  Laterality: N/A;  . HERNIA REPAIR     ventral hernia repair   . VASECTOMY      Family History  Problem Relation Age of Onset  . Heart failure Mother        living   . Hypertension Mother   . Hyperlipidemia Mother   . Heart disease Mother   . Hypertension Sister   . Hypertension Brother   . Colon cancer Neg Hx     Social History   Socioeconomic History  . Marital status: Married    Spouse name: Not on file  . Number of children: 1  . Years of education: Not on file  . Highest education level: Not on file  Occupational  History  . Occupation: Dispensing optician  Tobacco Use  . Smoking status: Never Smoker  . Smokeless tobacco: Never Used  Substance and Sexual Activity  . Alcohol use: Yes    Alcohol/week: 1.0 standard drinks    Types: 1 Cans of beer per week    Comment: occasional beer socially.   . Drug use: No  . Sexual activity: Yes  Other Topics Concern  . Not on file  Social History Narrative  . Not on file   Social Determinants of Health   Financial Resource Strain:   . Difficulty of Paying Living Expenses: Not on file  Food Insecurity:   . Worried About Programme researcher, broadcasting/film/video in the Last Year: Not on file  . Ran Out of Food in the Last Year: Not on file  Transportation Needs:   . Lack of Transportation (Medical): Not on file  . Lack of Transportation (Non-Medical): Not on file  Physical Activity:   . Days of Exercise per Week: Not on file  . Minutes of Exercise per Session: Not on file  Stress:   . Feeling of Stress : Not on file  Social Connections:   . Frequency of Communication with Friends and Family: Not on file  . Frequency of Social Gatherings with Friends and Family: Not on  file  . Attends Religious Services: Not on file  . Active Member of Clubs or Organizations: Not on file  . Attends Archivist Meetings: Not on file  . Marital Status: Not on file  Intimate Partner Violence:   . Fear of Current or Ex-Partner: Not on file  . Emotionally Abused: Not on file  . Physically Abused: Not on file  . Sexually Abused: Not on file     Physical Exam  Vital Signs and Nursing Notes reviewed Vitals:   06/11/19 1754  BP: (!) 140/96  Pulse: 77  Resp: 16  Temp: 97.8 F (36.6 C)  SpO2: 98%    CONSTITUTIONAL: Well-appearing, NAD NEURO:  Alert and oriented x 3, no focal deficits EYES:  eyes equal and reactive ENT/NECK:  no LAD, no JVD CARDIO: Regular rate, well-perfused, normal S1 and S2 PULM:  CTAB no wheezing or rhonchi GI/GU:  normal bowel sounds, non-distended,  non-tender; circumferential sutures of the distal penile foreskin, no active bleeding MSK/SPINE:  No gross deformities, no edema SKIN:  no rash, atraumatic PSYCH:  Appropriate speech and behavior  Diagnostic and Interventional Summary    EKG Interpretation  Date/Time:    Ventricular Rate:    PR Interval:    QRS Duration:   QT Interval:    QTC Calculation:   R Axis:     Text Interpretation:        Labs Reviewed - No data to display  No orders to display    Medications - No data to display   Procedures  /  Critical Care Procedures  ED Course and Medical Decision Making  I have reviewed the triage vital signs and the nursing notes.  Pertinent labs & imaging results that were available during my care of the patient were reviewed by me and considered in my medical decision making (see below for details).     Well-healing surgical site with sutures in place, no active bleeding, no signs of infection.  Estimated 10 to 20 cc of blood loss based on patient's underwear.  Will observe for a brief period of time and then provide reassurance, has follow-up in 2 weeks.    Barth Kirks. Sedonia Small, [place] [email or web address]  Final Clinical Impressions(s) / ED Diagnoses     ICD-10-CM   1. Complication of circumcision, initial encounter  T81.9XXA   2. Bleeding  R58     ED Discharge Orders    None       Discharge Instructions Discussed with and Provided to Patient:     Discharge Instructions     You were evaluated in the Emergency Department and after careful evaluation, we did not find any emergent condition requiring admission or further testing in the hospital.  Your exam/testing today is overall reassuring.  A small amount of bleeding after surgery can occur.  Please keep your appointment with your urologist.  Please return to the Emergency Department if you experience any worsening of your condition.  We encourage you  to follow up with a primary care provider.  Thank you for allowing [place] to be a part of your care.       Maudie Flakes, MD 06/11/19 Lurena Nida

## 2019-06-26 ENCOUNTER — Other Ambulatory Visit: Payer: Self-pay

## 2019-06-26 ENCOUNTER — Ambulatory Visit (INDEPENDENT_AMBULATORY_CARE_PROVIDER_SITE_OTHER): Payer: Medicare Other | Admitting: Urology

## 2019-06-26 ENCOUNTER — Encounter: Payer: Self-pay | Admitting: Urology

## 2019-06-26 VITALS — BP 133/83 | HR 80 | Temp 96.3°F | Ht 68.0 in | Wt 193.0 lb

## 2019-06-26 DIAGNOSIS — N481 Balanitis: Secondary | ICD-10-CM

## 2019-06-26 MED ORDER — BACITRACIN 500 UNIT/GM EX OINT: 1.0000 "application " | TOPICAL_OINTMENT | Freq: Two times a day (BID) | CUTANEOUS | 3 refills | Status: DC

## 2019-06-26 NOTE — Patient Instructions (Signed)
Circumcision, Adult, Care After This sheet gives you information about how to care for yourself after your procedure. Your doctor may also give you more specific instructions. If you have problems or questions, contact your doctor. Follow these instructions at home: Cut care   Follow instructions from your doctor about how to take care of your cut from surgery (incision). Make sure you: ? Wash your hands with soap and water before you change your bandage (dressing). If you cannot use soap and water, use hand sanitizer. ? Change your bandage as told by your doctor. ? Leave stitches (sutures), skin glue, or skin tape (adhesive) strips in place. They may need to stay in place for 2 weeks or longer. If tape strips get loose and curl up, you may trim the loose edges. Do not remove tape strips completely unless your doctor says it is okay  Check your cut area every day for signs of infection. Check for: ? More redness, swelling, or pain. ? More fluid or blood. ? Warmth. ? Pus or a bad smell. Bathing  Do not take baths, swim, or use a hot tub until your doctor says it is okay. Ask your doctor if you can take showers. You may only be allowed to take sponge baths for bathing.  Do not get your cut area wet during the 24 hours after the procedure, or as long as told by your doctor.  When you can shower, do not rub the cut area. Gently pat it dry. General instructions  Avoid heavy lifting, contact sports, biking, or swimming until your doctor says it is okay.  Do not have sex until your doctor says it is okay.  Take or apply over-the-counter and prescription medicines only as told by your doctor.  Keep all follow-up visits as told by your doctor. This is important. Contact a doctor if:  Medicine does not help your pain.  You have more redness, swelling, or pain around your cut.  You have more fluid or blood coming from your cut.  Your cut feels warm to the touch.  You have pus or a bad  smell coming from your cut.  You have a fever. Get help right away if:  You cannot pee (urinate).  It hurts to pee.  Your pain is not helped by medicines.  There is redness, swelling, and soreness that spreads up the shaft of your penis, your thighs, or your lower belly (abdomen).  You have bleeding that does not stop when you press on it. Summary  Follow instructions from your doctor about how to take care of your cut from surgery (incision).  Check your cut area every day for signs of infection.  Do not have sex until your doctor says it is okay. This information is not intended to replace advice given to you by your health care provider. Make sure you discuss any questions you have with your health care provider. Document Revised: 05/19/2017 Document Reviewed: 06/14/2016 Elsevier Patient Education  2020 [employer]vier Inc[employer]  

## 2019-06-26 NOTE — Progress Notes (Signed)
06/26/2019 9:15 AM   John [name] [birth year] [id number]  Referring provider: Fayrene Helper, MD [address], [place],  [place] [address]  Post op circumcision  HPI:  John [name] is a 59yo with a hx of phimosis s/p circumcision. Healing well. No pain. He presented to ER on 12/22 with increased bleeding from incision which resolved. He has been putting hydrogen peroxide on incision  PMH: Past Medical History:  Diagnosis Date  . Anxiety   . Depression 2007  . Diabetes mellitus without complication ([place])   . Hyperlipidemia   . Hypertension   . Hypogonadism male   . Obesity (BMI 30.0-34.9) 03/01/2017    Surgical History: Past Surgical History:  Procedure Laterality Date  . CIRCUMCISION N/A 06/10/2019   Procedure: CIRCUMCISION ADULT;  Surgeon: Cleon Gustin, MD;  Location: [facility];  Service: Urology;  Laterality: N/A;  30 MINS  . COLONOSCOPY  01/20/2011   Procedure: COLONOSCOPY;  Surgeon: Dorothyann Peng, MD;  Location[facility];  Service: Endoscopy;  Laterality: N/A;  . HERNIA REPAIR     ventral hernia repair   . VASECTOMY      Home Medications:  Allergies as of 06/26/2019      Reactions   Januvia [sitagliptin] Nausea And Vomiting   Poison Oak Extract [poison Oak Extract] Dermatitis      Medication List       Accurate as of June 26, 2019  9:15 AM. If you have any questions, ask your nurse or doctor.        Accu-Chek Aviva device 1 each by Other route 2 (two) times daily. Use as instructed bid. E11.65   Accu-Chek Softclix Lancets lancets 100 each by Other route 4 (four) times daily. Use as instructed   ALPRAZolam 1 MG tablet Commonly known as: XANAX TAKE 1 TABLET BY MOUTH ONCE DAILY AS NEEDED FOR ANXIETY   atorvastatin 40 MG tablet Commonly known as: LIPITOR Take 40 mg by mouth daily.   atorvastatin 20 MG tablet Commonly known as: LIPITOR Take 1 tablet (20 mg total) by mouth daily.   blood glucose meter kit and supplies  Kit Dispense based on patient and insurance preference. Use up to four times daily as directed. (FOR ICD-9 250.00, 250.01).   FLUoxetine 40 MG capsule Commonly known as: PROZAC Take 1 capsule (40 mg total) by mouth daily.   glucose blood test strip Commonly known as: Accu-Chek Aviva Use to test blood glucose 4 times a day. E11.65   ibuprofen 800 MG tablet Commonly known as: ADVIL Take one tablet by mouth three times daily for 5 days only, for bilateral foot pain   Lantus SoloStar 100 UNIT/ML Solostar Pen Generic drug: Insulin Glargine INJECT 60 UNITS SUBCUTANEOUSLY AT BEDTIME   lisinopril-hydrochlorothiazide 10-12.5 MG tablet Commonly known as: ZESTORETIC Take 1 tablet by mouth once daily   metFORMIN 500 MG tablet Commonly known as: GLUCOPHAGE Take 1/2 (one-half) tablet by mouth twice daily   ondansetron 4 MG tablet Commonly known as: ZOFRAN Take 1 tablet (4 mg total) by mouth every 8 (eight) hours as needed for nausea or vomiting.   oxyCODONE-acetaminophen 5-325 MG tablet Commonly known as: Percocet Take 1 tablet by mouth every 4 (four) hours as needed for severe pain.   traZODone 100 MG tablet Commonly known as: DESYREL Take 1 tablet (100 mg total) by mouth at bedtime.       Allergies:  Allergies  Allergen Reactions  . Januvia [Sitagliptin] Nausea And Vomiting  .  Poison Oak Extract [Poison Oak Extract] Dermatitis    Family History: Family History  Problem Relation Age of Onset  . Heart failure Mother        living   . Hypertension Mother   . Hyperlipidemia Mother   . Heart disease Mother   . Hypertension Sister   . Hypertension Brother   . Colon cancer Neg Hx     Social History:  reports that he has never smoked. He has never used smokeless tobacco. He reports current alcohol use of about 1.0 standard drinks of alcohol per week. He reports that he does not use drugs.  ROS:  14 point reviewed and negative                                       Physical Exam: BP 133/83   Pulse 80   Temp (!) 96.3 F (35.7 C)   Ht '5\' 8"'  (1.727 m)   Wt 193 lb (87.5 kg)   BMI 29.35 kg/m   Constitutional:  Alert and oriented, No acute distress. HEENT: [place] AT, moist mucus membranes.  Trachea midline, no masses. Cardiovascular: No clubbing, cyanosis, or edema. Respiratory: Normal respiratory effort, no increased work of breathing. GI: Abdomen is soft, nontender, nondistended, no abdominal masses GU: No CVA tenderness. Healing circumcision incision Lymph: No cervical or inguinal lymphadenopathy. Skin: No rashes, bruises or suspicious lesions. Neurologic: Grossly intact, no focal deficits, moving all 4 extremities. Psychiatric: Normal mood and affect.  Laboratory Data: Lab Results  Component Value Date   WBC 7.6 03/18/2019   HGB 13.1 (L) 03/18/2019   HCT 38.6 03/18/2019   MCV 90.8 03/18/2019   PLT 368 03/18/2019    Lab Results  Component Value Date   CREATININE 1.20 06/10/2019    Lab Results  Component Value Date   PSA 0.9 03/18/2019   PSA 0.8 07/19/2017   PSA 0.86 10/23/2015    Lab Results  Component Value Date   TESTOSTERONE 343.29 (L) 12/12/2009    Lab Results  Component Value Date   HGBA1C 5.6 06/10/2019    Urinalysis    Component Value Date/Time   COLORURINE YELLOW 12/28/2016 1645   APPEARANCEUR CLEAR 12/28/2016 1645   LABSPEC 1.031 (H) 12/28/2016 1645   PHURINE 5.0 12/28/2016 1645   GLUCOSEU >=500 (A) 12/28/2016 1645   HGBUR NEGATIVE 12/28/2016 1645   BILIRUBINUR NEGATIVE 12/28/2016 1645   KETONESUR 5 (A) 12/28/2016 1645   PROTEINUR NEGATIVE 12/28/2016 1645   UROBILINOGEN 0.2 08/21/2008 2301   NITRITE NEGATIVE 12/28/2016 1645   LEUKOCYTESUR NEGATIVE 12/28/2016 1645    Lab Results  Component Value Date   BACTERIA NONE SEEN 12/28/2016    Pertinent Imaging:  No results found for this or any previous visit. No results found for this or any previous visit. No results found for this or  any previous visit. No results found for this or any previous visit. No results found for this or any previous visit. No results found for this or any previous visit. No results found for this or any previous visit. No results found for this or any previous visit.  Assessment & Plan:    Balanitis s/p circ: -bacitracin BID to incision. RTC 3 months  No follow-ups on file.  Nicolette Bang, MD  [facility] [place]

## 2019-07-02 ENCOUNTER — Encounter: Payer: Self-pay | Admitting: "Endocrinology

## 2019-07-02 ENCOUNTER — Ambulatory Visit (INDEPENDENT_AMBULATORY_CARE_PROVIDER_SITE_OTHER): Payer: Medicare Other | Admitting: "Endocrinology

## 2019-07-02 DIAGNOSIS — E669 Obesity, unspecified: Secondary | ICD-10-CM | POA: Diagnosis not present

## 2019-07-02 DIAGNOSIS — I10 Essential (primary) hypertension: Secondary | ICD-10-CM

## 2019-07-02 DIAGNOSIS — E782 Mixed hyperlipidemia: Secondary | ICD-10-CM

## 2019-07-02 DIAGNOSIS — E1169 Type 2 diabetes mellitus with other specified complication: Secondary | ICD-10-CM | POA: Diagnosis not present

## 2019-07-02 MED ORDER — LANTUS SOLOSTAR 100 UNIT/ML ~~LOC~~ SOPN: 30.0000 [IU] | PEN_INJECTOR | Freq: Every day | SUBCUTANEOUS | 1 refills | Status: DC

## 2019-07-02 NOTE — Progress Notes (Signed)
07/02/2019                                                      Endocrinology Telehealth Visit Follow up Note -During COVID -19 Pandemic  This visit type was conducted due to national recommendations for restrictions regarding the COVID-19 Pandemic  in an effort to limit this patient's exposure and mitigate transmission of the corona virus.  Due to his co-morbid illnesses, John [name] is at  moderate to high risk for complications without adequate follow up.  This format is felt to be most appropriate for him at this time.  I connected with this patient on 07/02/2019   by telephone and verified that I am speaking with the correct person using two identifiers. John [name], 1961-04-20. he has verbally consented to this visit. All issues noted in this document were discussed and addressed. The format was not optimal for physical exam.    Subjective:    Patient ID: John [name], male    DOB: 07-02-1960. Patient is being engaged in telehealth via telephone in follow-up for management of chronically uncontrolled type 2 diabetes, hyperlipidemia, hypertension. PMD:   Fayrene Helper, MD  Past Medical History:  Diagnosis Date  . Anxiety   . Depression 2007  . Diabetes mellitus without complication ([place])   . Hyperlipidemia   . Hypertension   . Hypogonadism male   . Obesity (BMI 30.0-34.9) 03/01/2017   Past Surgical History:  Procedure Laterality Date  . CIRCUMCISION N/A 06/10/2019   Procedure: CIRCUMCISION ADULT;  Surgeon: Cleon Gustin, MD;  Location: [facility];  Service: Urology;  Laterality: N/A;  30 MINS  . COLONOSCOPY  01/20/2011   Procedure: COLONOSCOPY;  Surgeon: Dorothyann Peng, MD;  Location: [facility];  Service: Endoscopy;  Laterality: N/A;  . HERNIA REPAIR     ventral hernia repair   . VASECTOMY     Social History   Socioeconomic History  . Marital status: Married    Spouse name: Not on file  . Number of children: 1  . Years of education: Not on file  .  Highest education level: Not on file  Occupational History  . Occupation: Production assistant, radio  Tobacco Use  . Smoking status: Never Smoker  . Smokeless tobacco: Never Used  Substance and Sexual Activity  . Alcohol use: Yes    Alcohol/week: 1.0 standard drinks    Types: 1 Cans of beer per week    Comment: occasional beer socially.   . Drug use: No  . Sexual activity: Yes  Other Topics Concern  . Not on file  Social History Narrative  . Not on file   Social Determinants of Health   Financial Resource Strain:   . Difficulty of Paying Living Expenses: Not on file  Food Insecurity:   . Worried About Charity fundraiser in the Last Year: Not on file  . Ran Out of Food in the Last Year: Not on file  Transportation Needs:   . Lack of Transportation (Medical): Not on file  . Lack of Transportation (Non-Medical): Not on file  Physical Activity:   . Days of Exercise per Week: Not on file  . Minutes of Exercise per Session: Not on file  Stress:   . Feeling of Stress : Not on file  Social Connections:   .  Frequency of Communication with Friends and Family: Not on file  . Frequency of Social Gatherings with Friends and Family: Not on file  . Attends Religious Services: Not on file  . Active Member of Clubs or Organizations: Not on file  . Attends Archivist Meetings: Not on file  . Marital Status: Not on file   Outpatient Encounter Medications as of 07/02/2019  Medication Sig  . ACCU-CHEK SOFTCLIX LANCETS lancets 100 each by Other route 4 (four) times daily. Use as instructed  . ALPRAZolam (XANAX) 1 MG tablet TAKE 1 TABLET BY MOUTH ONCE DAILY AS NEEDED FOR ANXIETY  . atorvastatin (LIPITOR) 20 MG tablet Take 1 tablet (20 mg total) by mouth daily.  [name] atorvastatin (LIPITOR) 40 MG tablet Take 40 mg by mouth daily.  . bacitracin 500 UNIT/GM ointment Apply 1 application topically 2 (two) times daily.  . blood glucose meter kit and supplies KIT Dispense based on patient and insurance  preference. Use up to four times daily as directed. (FOR ICD-9 250.00, 250.01).  . Blood Glucose Monitoring Suppl (ACCU-CHEK AVIVA) device 1 each by Other route 2 (two) times daily. Use as instructed bid. E11.65  . FLUoxetine (PROZAC) 40 MG capsule Take 1 capsule (40 mg total) by mouth daily.  [name] glucose blood (ACCU-CHEK AVIVA) test strip Use to test blood glucose 4 times a day. E11.65  . ibuprofen (ADVIL) 800 MG tablet Take one tablet by mouth three times daily for 5 days only, for bilateral foot pain  . Insulin Glargine (LANTUS SOLOSTAR) 100 UNIT/ML Solostar Pen Inject 30 Units into the skin at bedtime. INJECT 60 UNITS SUBCUTANEOUSLY AT BEDTIME  . lisinopril-hydrochlorothiazide (ZESTORETIC) 10-12.5 MG tablet Take 1 tablet by mouth once daily  . metFORMIN (GLUCOPHAGE) 500 MG tablet Take 1/2 (one-half) tablet by mouth twice daily  . ondansetron (ZOFRAN) 4 MG tablet Take 1 tablet (4 mg total) by mouth every 8 (eight) hours as needed for nausea or vomiting.  [name] oxyCODONE-acetaminophen (PERCOCET) 5-325 MG tablet Take 1 tablet by mouth every 4 (four) hours as needed for severe pain.  . traZODone (DESYREL) 100 MG tablet Take 1 tablet (100 mg total) by mouth at bedtime.  . [DISCONTINUED] Insulin Glargine (LANTUS SOLOSTAR) 100 UNIT/ML Solostar Pen INJECT 60 UNITS SUBCUTANEOUSLY AT BEDTIME   No facility-administered encounter medications on file as of 07/02/2019.   ALLERGIES: Allergies  Allergen Reactions  . Januvia [Sitagliptin] Nausea And Vomiting  . Poison Oak Extract [Poison Oak Extract] Dermatitis   VACCINATION STATUS: Immunization History  Administered Date(s) Administered  . Influenza Whole 03/18/2010, 04/13/2011  . Influenza,inj,Quad PF,6+ Mos 05/20/2013, 05/08/2014, 06/11/2015, 04/18/2017, 05/23/2018, 03/18/2019  . Pneumococcal Polysaccharide-23 11/26/2013, 03/18/2019  . Td 09/15/2009    Diabetes He presents for his follow-up diabetic visit. He has type 2 diabetes mellitus. Onset time:  He was diagnosed at age 25. His disease course has been improving. There are no hypoglycemic associated symptoms. Pertinent negatives for hypoglycemia include no headaches, seizures or tremors. Pertinent negatives for diabetes include no blurred vision, no chest pain, no polydipsia and no polyuria. There are no hypoglycemic complications. Symptoms are improving. There are no diabetic complications. Risk factors for coronary artery disease include dyslipidemia, diabetes mellitus, hypertension, male sex, obesity and sedentary lifestyle. Current diabetic treatment includes oral agent (monotherapy). His weight is decreasing steadily. He is following a generally unhealthy diet. He has not had a previous visit with a dietitian. He never participates in exercise. His home blood glucose trend is decreasing steadily. His breakfast  blood glucose range is generally 130-140 mg/dl. His overall blood glucose range is 140-180 mg/dl. An ACE inhibitor/angiotensin II receptor blocker is being taken. Eye exam is current.  Hyperlipidemia This is a chronic problem. The current episode started more than 1 year ago. The problem is uncontrolled. Exacerbating diseases include diabetes and obesity. Pertinent negatives include no chest pain, myalgias or shortness of breath. Current antihyperlipidemic treatment includes statins. Risk factors for coronary artery disease include diabetes mellitus, hypertension, male sex, obesity, a sedentary lifestyle and dyslipidemia.  Hypertension This is a chronic problem. The current episode started more than 1 year ago. The problem is controlled. Pertinent negatives include no blurred vision, chest pain, headaches, palpitations or shortness of breath. Risk factors for coronary artery disease include dyslipidemia, diabetes mellitus, male gender, obesity and sedentary lifestyle. Past treatments include ACE inhibitors and diuretics.   Review of systems: Limited as above.   Objective:    There were  no vitals taken for this visit.  Wt Readings from Last 3 Encounters:  06/26/19 193 lb (87.5 kg)  06/11/19 190 lb (86.2 kg)  05/28/19 196 lb (88.9 kg)      CMP     Component Value Date/Time   NA 138 06/10/2019 0839   K 3.7 06/10/2019 0839   CL 99 06/10/2019 0839   CO2 29 06/10/2019 0839   GLUCOSE 83 06/10/2019 0839   BUN 10 06/10/2019 0839   CREATININE 1.20 06/10/2019 0839   CREATININE 1.14 02/26/2019 0803   CALCIUM 9.5 06/10/2019 0839   PROT 7.1 02/26/2019 0803   ALBUMIN 4.1 12/30/2016 0950   AST 19 02/26/2019 0803   ALT 21 02/26/2019 0803   ALKPHOS 95 12/30/2016 0950   BILITOT 0.9 02/26/2019 0803   GFRNONAA >60 06/10/2019 0839   GFRNONAA 70 02/26/2019 0803   GFRAA >60 06/10/2019 0839   GFRAA 81 02/26/2019 0803    Lab Results  Component Value Date   HGBA1C 5.6 06/10/2019   HGBA1C 6.5 (H) 02/26/2019   HGBA1C 5.8 (H) 10/16/2018    Lipid Panel     Component Value Date/Time   CHOL 134 03/18/2019 1031   TRIG 93 03/18/2019 1031   HDL 33 (L) 03/18/2019 1031   CHOLHDL 4.1 03/18/2019 1031   VLDL 64 (H) 12/30/2016 0950   LDLCALC 82 03/18/2019 1031     Assessment & Plan:   1. Diabetes mellitus type 2 in obese [facility])  - Patient has currently uncontrolled symptomatic type 2 DM since  59 years of age. -He reports tightly controlled glycemic profile, with some rare and random readings in the 60s.  His previsit labs show A1c of 5.6%.     His diabetes is complicated by obesity , prior history of noncompliance/nonadherence, and patient remains at a high risk for more acute and chronic complications of diabetes which include CAD, CVA, CKD, retinopathy, and neuropathy. These are all discussed in detail with the patient.  - I have counseled the patient on diet management and weight loss, by adopting a carbohydrate restricted/protein rich diet.  - he  admits there is a room for improvement in his diet and drink choices. -  Suggestion is made for him to avoid simple  carbohydrates  from his diet including Cakes, Sweet Desserts / Pastries, Ice Cream, Soda (diet and regular), Sweet Tea, Candies, Chips, Cookies, Sweet Pastries,  Store Bought Juices, Alcohol in Excess of  1-2 drinks a day, Artificial Sweeteners, Coffee Creamer, and "Sugar-free" Products. This will help patient to have stable blood glucose  profile and potentially avoid unintended weight gain.   - I encouraged the patient to switch to  unprocessed or minimally processed complex starch and increased protein intake (animal or plant source), fruits, and vegetables.  - Patient is advised to stick to a routine mealtimes to eat 3 meals  a day and avoid unnecessary snacks ( to snack only to correct hypoglycemia).   - I have approached patient with the following individualized plan to manage diabetes and patient agrees:   -Based on his response with tight glycemic profile, he will be considered for lower dose of insulin.  -He is advised to lower his Lantus to 30  units nightly,  associated with strict monitoring of blood glucose 2 times a day-daily before breakfast and at bedtime.  -Patient is encouraged to call clinic for blood glucose levels less than 70 or above 300 mg /dl.  -He will continue Metformin 500 mg p.o. twice daily-daily after breakfast and after supper.   - Patient will be considered for incretin therapy as appropriate next visit.  2) BP/HTN: he is advised to home monitor blood pressure and report if > 140/90 on 2 separate readings.    He is advised to continue his current blood pressure medications including lisinopril/hydrochlorthiazide  10/12.5 mg po daily.  3) Lipids/HPL: His recent lipid panel showed improved LDL to 95.  He is advised to continue simvastatin 40 mg p.o. nightly.  Side effects and precautions discussed with him.    4) Chronic Care/Health Maintenance:  -Patient is on ACEI/ARB and Statin medications and encouraged to continue to follow up with Ophthalmology,  Podiatrist at least yearly or according to recommendations, and advised to   stay away from smoking. I have recommended yearly flu vaccine and pneumonia vaccination at least every 5 years; moderate intensity exercise for up to 150 minutes weekly; and  sleep for at least 7 hours a day.  - I advised patient to maintain close follow up with Fayrene Helper, MD for primary care needs.  - Time spent on this patient care encounter:  35 min, of which >50% was spent in  counseling and the rest reviewing his  current and  previous labs/studies ( including abstraction from other facilities),  previous treatments, his blood glucose readings, and medications' doses and developing a plan for long-term care based on the latest recommendations for standards of care; and documenting his care.  John [name] participated in the discussions, expressed understanding, and voiced agreement with the above plans.  All questions were answered to his satisfaction. he is encouraged to contact clinic should he have any questions or concerns prior to his return visit.   Follow up plan: - Return in about 4 months (around 10/30/2019) for Bring Meter and Logs- A1c in Office.  Glade Lloyd, MD Phone: [phone]  Fax: [phone]   This note was partially dictated with voice recognition software. Similar sounding words can be transcribed inadequately or may not  be corrected upon review.  07/02/2019, 1:31 PM

## 2019-07-09 DIAGNOSIS — E119 Type 2 diabetes mellitus without complications: Secondary | ICD-10-CM | POA: Diagnosis not present

## 2019-07-09 LAB — HM DIABETES EYE EXAM

## 2019-07-11 ENCOUNTER — Other Ambulatory Visit: Payer: Self-pay

## 2019-07-11 MED ORDER — LANTUS SOLOSTAR 100 UNIT/ML ~~LOC~~ SOPN: 30.0000 [IU] | PEN_INJECTOR | Freq: Every day | SUBCUTANEOUS | 2 refills | Status: DC

## 2019-07-15 ENCOUNTER — Telehealth: Payer: Self-pay | Admitting: "Endocrinology

## 2019-07-15 NOTE — Telephone Encounter (Signed)
Patient notified

## 2019-07-15 NOTE — Telephone Encounter (Signed)
Pt said that his lantus is over $400. I asked him to check with insurance to see what else they would cover cheaper. Do you have any other suggestions?

## 2019-07-15 NOTE — Telephone Encounter (Signed)
He can come get samples of either tresiba or Toujeo to cover until his next visit.

## 2019-07-16 NOTE — Telephone Encounter (Signed)
Pt picked up tresbia. 3 boxes

## 2019-07-22 DIAGNOSIS — M79672 Pain in left foot: Secondary | ICD-10-CM | POA: Diagnosis not present

## 2019-07-22 DIAGNOSIS — M79671 Pain in right foot: Secondary | ICD-10-CM | POA: Diagnosis not present

## 2019-07-22 DIAGNOSIS — M722 Plantar fascial fibromatosis: Secondary | ICD-10-CM | POA: Diagnosis not present

## 2019-07-24 ENCOUNTER — Other Ambulatory Visit: Payer: Self-pay | Admitting: "Endocrinology

## 2019-08-15 ENCOUNTER — Other Ambulatory Visit: Payer: Self-pay

## 2019-08-15 ENCOUNTER — Ambulatory Visit (INDEPENDENT_AMBULATORY_CARE_PROVIDER_SITE_OTHER): Payer: Medicare Other | Admitting: Family Medicine

## 2019-08-15 ENCOUNTER — Encounter: Payer: Self-pay | Admitting: Family Medicine

## 2019-08-15 VITALS — BP 120/82 | HR 85 | Temp 97.7°F | Resp 15 | Ht 68.0 in | Wt 193.0 lb

## 2019-08-15 DIAGNOSIS — E782 Mixed hyperlipidemia: Secondary | ICD-10-CM | POA: Diagnosis not present

## 2019-08-15 DIAGNOSIS — E663 Overweight: Secondary | ICD-10-CM | POA: Diagnosis not present

## 2019-08-15 DIAGNOSIS — E669 Obesity, unspecified: Secondary | ICD-10-CM | POA: Diagnosis not present

## 2019-08-15 DIAGNOSIS — F324 Major depressive disorder, single episode, in partial remission: Secondary | ICD-10-CM

## 2019-08-15 DIAGNOSIS — I10 Essential (primary) hypertension: Secondary | ICD-10-CM | POA: Diagnosis not present

## 2019-08-15 DIAGNOSIS — E1169 Type 2 diabetes mellitus with other specified complication: Secondary | ICD-10-CM

## 2019-08-15 NOTE — Assessment & Plan Note (Signed)
  Hyperlipidemia:Low fat diet discussed and encouraged. Updated lab needed at/ before next visit.   Lipid Panel  Lab Results  Component Value Date   CHOL 134 03/18/2019   HDL 33 (L) 03/18/2019   LDLCALC 82 03/18/2019   TRIG 93 03/18/2019   CHOLHDL 4.1 03/18/2019

## 2019-08-15 NOTE — Progress Notes (Signed)
   QUENTIN SHOREY     MRN: [id number]      DOB: [birth year]   HPI John [name] is here for follow up and re-evaluation of chronic medical conditions, medication management and review of any available recent lab and radiology data.  Preventive health is updated, specifically  Cancer screening and Immunization.   Questions or concerns regarding consultations or procedures which the PT has had in the interim are  addressed. The PT denies any adverse reactions to current medications since the last visit.  There are no new concerns.  There are no specific complaints  Denies polyuria, polydipsia, blurred vision , or hypoglycemic episodes.   ROS Denies recent fever or chills. Denies sinus pressure, nasal congestion, ear pain or sore throat. Denies chest congestion, productive cough or wheezing. Denies chest pains, palpitations and leg swelling Denies abdominal pain, nausea, vomiting,diarrhea or constipation.   Denies dysuria, frequency, hesitancy or incontinence. Denies joint pain, swelling and limitation in mobility. Denies headaches, seizures, numbness, or tingling. C/o  depression, improved however, also has anxiety or insomnia. Denies skin break down or rash.   PE BP 120/82   Pulse 85   Temp 97.7 F (36.5 C) (Temporal)   Resp 15   Ht 5\' 8"  (1.727 m)   Wt 193 lb (87.5 kg)   SpO2 97%   BMI 29.35 kg/m    Patient alert and oriented and in no cardiopulmonary distress.  HEENT: No facial asymmetry, EOMI,     Neck supple .  Chest: Clear to auscultation bilaterally.  CVS: S1, S2 no murmurs, no S3.Regular rate.  ABD: Soft non tender.   Ext: No edema  MS: Adequate ROM spine, shoulders, hips and knees.  Skin: Intact, no ulcerations or rash noted.  Psych: Good eye contact, normal affect. Memory intact not anxious or depressed appearing.  CNS: CN 2-12 intact, power,  normal throughout.no focal deficits noted.   Assessment & Plan  Essential hypertension Controlled, no  change in medication DASH diet and commitment to daily physical activity for a minimum of 30 minutes discussed and encouraged, as a part of hypertension management. The importance of attaining a healthy weight is also discussed.  BP/Weight 08/15/2019 06/26/2019 06/11/2019 06/10/2019 05/28/2019 03/18/2019 01/15/2019  Systolic BP 120 133 140 163 122 122 140  Diastolic BP 82 83 96 75 74 74 78  Wt. (Lbs) 193 193 190 - 196 196 200  BMI 29.35 29.35 28.89 - 29.8 29.8 30.41  Some encounter information is confidential and restricted. Go to Review Flowsheets activity to see all data.       Mixed hyperlipidemia  Hyperlipidemia:Low fat diet discussed and encouraged. Updated lab needed at/ before next visit.   Lipid Panel  Lab Results  Component Value Date   CHOL 134 03/18/2019   HDL 33 (L) 03/18/2019   LDLCALC 82 03/18/2019   TRIG 93 03/18/2019   CHOLHDL 4.1 03/18/2019       Depression, major, single episode, in partial remission (HCC) Improved but not at goal, managed by Psych, not suicidal or homicidal. Since health is improved and he will soon be able to resume fishing , I anticipate continued improvement. He is not suicidal or homicidal

## 2019-08-15 NOTE — Assessment & Plan Note (Signed)
Controlled, no change in medication DASH diet and commitment to daily physical activity for a minimum of 30 minutes discussed and encouraged, as a part of hypertension management. The importance of attaining a healthy weight is also discussed.  BP/Weight 08/15/2019 06/26/2019 06/11/2019 06/10/2019 05/28/2019 03/18/2019 01/15/2019  Systolic BP 120 133 140 163 122 122 140  Diastolic BP 82 83 96 75 74 74 78  Wt. (Lbs) 193 193 190 - 196 196 200  BMI 29.35 29.35 28.89 - 29.8 29.8 30.41  Some encounter information is confidential and restricted. Go to Review Flowsheets activity to see all data.

## 2019-08-15 NOTE — Assessment & Plan Note (Signed)
Improved but not at goal, managed by Psych, not suicidal or homicidal. Since health is improved and he will soon be able to resume fishing , I anticipate continued improvement. He is not suicidal or homicidal

## 2019-08-15 NOTE — Patient Instructions (Addendum)
F/u in office with MD in 6 months, call if you need me before   CONGRATS on excellent blood sugar, thankful you are feeling better  Continue to work with mental health re depression, fishing will help a lot, also the fact that you are physically better  Blood pressure is excellent Please get fasting lipid, cmp and eGFR first week in April  Think about what you will eat, plan ahead. Choose " clean, green, fresh or frozen" over canned, processed or packaged foods which are more sugary, salty and fatty. 70 to 75% of food eaten should be vegetables and fruit. Three meals at set times with snacks allowed between meals, but they must be fruit or vegetables. Aim to eat over a 12 hour period , example 7 am to 7 pm, and STOP after  your last meal of the day. Drink water,generally about 64 ounces per day, no other drink is as healthy. Fruit juice is best enjoyed in a healthy way, by EATING the fruit.  It is important that you exercise regularly at least 30 minutes 5 times a week. If you develop chest pain, have severe difficulty breathing, or feel very tired, stop exercising immediately and seek medical attention   Thanks for choosing [facility], we consider it a privelige to serve you.  

## 2019-08-15 NOTE — Assessment & Plan Note (Signed)
  Patient re-educated about  the importance of commitment to a  minimum of 150 minutes of exercise per week as able.  The importance of healthy food choices with portion control discussed, as well as eating regularly and within a 12 hour window most days. The need to choose "clean , green" food 50 to 75% of the time is discussed, as well as to make water the primary drink and set a goal of 64 ounces water daily.    Weight /BMI 08/15/2019 06/26/2019 06/11/2019  WEIGHT 193 lb 193 lb 190 lb  HEIGHT 5\' 8"  5\' 8"  5\' 8"   BMI 29.35 kg/m2 29.35 kg/m2 28.89 kg/m2  Some encounter information is confidential and restricted. Go to Review Flowsheets activity to see all data.

## 2019-08-19 ENCOUNTER — Other Ambulatory Visit: Payer: Self-pay | Admitting: *Deleted

## 2019-08-19 ENCOUNTER — Ambulatory Visit: Payer: Medicare Other | Admitting: Family Medicine

## 2019-08-19 MED ORDER — ATORVASTATIN CALCIUM 20 MG PO TABS: 20.0000 mg | ORAL_TABLET | Freq: Every day | ORAL | 3 refills | Status: DC

## 2019-08-21 ENCOUNTER — Ambulatory Visit (INDEPENDENT_AMBULATORY_CARE_PROVIDER_SITE_OTHER): Payer: Medicare Other | Admitting: Psychiatry

## 2019-08-21 ENCOUNTER — Encounter (HOSPITAL_COMMUNITY): Payer: Self-pay | Admitting: Psychiatry

## 2019-08-21 ENCOUNTER — Other Ambulatory Visit: Payer: Self-pay

## 2019-08-21 DIAGNOSIS — F418 Other specified anxiety disorders: Secondary | ICD-10-CM | POA: Diagnosis not present

## 2019-08-21 MED ORDER — ALPRAZOLAM 1 MG PO TABS: ORAL_TABLET | ORAL | 2 refills | Status: DC

## 2019-08-21 MED ORDER — FLUOXETINE HCL 40 MG PO CAPS: 40.0000 mg | ORAL_CAPSULE | Freq: Every day | ORAL | 2 refills | Status: DC

## 2019-08-21 MED ORDER — TRAZODONE HCL 100 MG PO TABS: 100.0000 mg | ORAL_TABLET | Freq: Every day | ORAL | 2 refills | Status: DC

## 2019-08-21 NOTE — Progress Notes (Signed)
Virtual Visit via Telephone Note  I connected with John [name] on 08/21/19 at  9:00 AM EST by telephone and verified that I am speaking with the correct person using two identifiers.   I discussed the limitations, risks, security and privacy concerns of performing an evaluation and management service by telephone and the availability of in person appointments. I also discussed with the patient that there may be a patient responsible charge related to this service. The patient expressed understanding and agreed to proceed.    I discussed the assessment and treatment plan with the patient. The patient was provided an opportunity to ask questions and all were answered. The patient agreed with the plan and demonstrated an understanding of the instructions.   The patient was advised to call back or seek an in-person evaluation if the symptoms worsen or if the condition fails to improve as anticipated.  I provided 15 minutes of non-face-to-face time during this encounter.   Diannia Ruder, MD  [facility] MD/PA/NP OP Progress Note  08/21/2019 9:16 AM John [name]  MRN:  [id number]  Chief Complaint:  Chief Complaint    Depression; Follow-up     HPI: Patient is a 59 year old married black male who lives with his wife in [place].  He had 1 son who died at age 66 in 11 in a motor vehicle accident.  He is currently on disability.  The patient returns after 3 months.  He states that he has been doing better in terms of his mood.  He has been getting up and about more and plans to go fishing when the weather warms up.  He is still taking care of his ill wife.  He states that he is sleeping fairly well but he often has to get up during the night to help her.  He denies any thoughts of suicide or self-harm and his energy seems to be much improved.  The Xanax is helping with his anxiety. Visit Diagnosis:    ICD-10-CM   1. Depression with anxiety  F41.8 FLUoxetine (PROZAC) 40 MG capsule    Past  Psychiatric History:none  Past Medical History:  Past Medical History:  Diagnosis Date  . Anxiety   . Depression 2007  . Diabetes mellitus without complication (HCC)   . Helicobacter pylori ab+ 01/21/2019   Dx in 12/2018, will treat  . Hyperlipidemia   . Hypertension   . Hypogonadism male   . Obesity (BMI 30.0-34.9) 03/01/2017    Past Surgical History:  Procedure Laterality Date  . CIRCUMCISION N/A 06/10/2019   Procedure: CIRCUMCISION ADULT;  Surgeon: Malen Gauze, MD;  Location: [facility];  Service: Urology;  Laterality: N/A;  30 MINS  . COLONOSCOPY  01/20/2011   Procedure: COLONOSCOPY;  Surgeon: Arlyce Harman, MD;  Location: [facility];  Service: Endoscopy;  Laterality: N/A;  . HERNIA REPAIR     ventral hernia repair   . VASECTOMY      Family Psychiatric History: see below  Family History:  Family History  Problem Relation Age of Onset  . Heart failure Mother        living   . Hypertension Mother   . Hyperlipidemia Mother   . Heart disease Mother   . Hypertension Sister   . Hypertension Brother   . Colon cancer Neg Hx     Social History:  Social History   Socioeconomic History  . Marital status: Married    Spouse name: Not on file  . Number of children:  1  . Years of education: Not on file  . Highest education level: Not on file  Occupational History  . Occupation: Dispensing optician  Tobacco Use  . Smoking status: Never Smoker  . Smokeless tobacco: Never Used  Substance and Sexual Activity  . Alcohol use: Yes    Alcohol/week: 1.0 standard drinks    Types: 1 Cans of beer per week    Comment: occasional beer socially.   . Drug use: No  . Sexual activity: Yes  Other Topics Concern  . Not on file  Social History Narrative  . Not on file   Social Determinants of Health   Financial Resource Strain:   . Difficulty of Paying Living Expenses: Not on file  Food Insecurity:   . Worried About Programme researcher, broadcasting/film/video in the Last Year: Not on file  . Ran Out of  Food in the Last Year: Not on file  Transportation Needs:   . Lack of Transportation (Medical): Not on file  . Lack of Transportation (Non-Medical): Not on file  Physical Activity:   . Days of Exercise per Week: Not on file  . Minutes of Exercise per Session: Not on file  Stress:   . Feeling of Stress : Not on file  Social Connections:   . Frequency of Communication with Friends and Family: Not on file  . Frequency of Social Gatherings with Friends and Family: Not on file  . Attends Religious Services: Not on file  . Active Member of Clubs or Organizations: Not on file  . Attends Banker Meetings: Not on file  . Marital Status: Not on file    Allergies:  Allergies  Allergen Reactions  . Januvia [Sitagliptin] Nausea And Vomiting  . Poison Oak Extract [Poison Oak Extract] Dermatitis    Metabolic Disorder Labs: Lab Results  Component Value Date   HGBA1C 5.6 06/10/2019   MPG 114.02 06/10/2019   MPG 140 02/26/2019   No results found for: PROLACTIN Lab Results  Component Value Date   CHOL 134 03/18/2019   TRIG 93 03/18/2019   HDL 33 (L) 03/18/2019   CHOLHDL 4.1 03/18/2019   VLDL 64 (H) 12/30/2016   LDLCALC 82 03/18/2019   LDLCALC 95 06/12/2018   Lab Results  Component Value Date   TSH 2.84 03/18/2019   TSH 2.35 01/26/2016    Therapeutic Level Labs: No results found for: LITHIUM No results found for: VALPROATE No components found for:  CBMZ  Current Medications: Current Outpatient Medications  Medication Sig Dispense Refill  . ACCU-CHEK SOFTCLIX LANCETS lancets 100 each by Other route 4 (four) times daily. Use as instructed 100 each 5  . ALPRAZolam (XANAX) 1 MG tablet TAKE 1 TABLET BY MOUTH ONCE DAILY AS NEEDED FOR ANXIETY 30 tablet 2  . atorvastatin (LIPITOR) 20 MG tablet Take 1 tablet (20 mg total) by mouth daily. 30 tablet 3  . bacitracin 500 UNIT/GM ointment Apply 1 application topically 2 (two) times daily. 15 g 3  . Blood Glucose Monitoring  Suppl (ACCU-CHEK AVIVA) device 1 each by Other route 2 (two) times daily. Use as instructed bid. E11.65 1 each 0  . FLUoxetine (PROZAC) 40 MG capsule Take 1 capsule (40 mg total) by mouth daily. 30 capsule 2  . glucose blood (ACCU-CHEK AVIVA) test strip Use to test blood glucose 4 times a day. E11.65 150 each 5  . ibuprofen (ADVIL) 800 MG tablet Take one tablet by mouth three times daily for 5 days only, for bilateral  foot pain 30 tablet 0  . Insulin Glargine (LANTUS SOLOSTAR) 100 UNIT/ML Solostar Pen Inject 30 Units into the skin at bedtime. 15 mL 2  . lisinopril-hydrochlorothiazide (ZESTORETIC) 10-12.5 MG tablet Take 1 tablet by mouth once daily 90 tablet 1  . metFORMIN (GLUCOPHAGE) 500 MG tablet Take 1/2 (one-half) tablet by mouth twice daily 90 tablet 0  . traZODone (DESYREL) 100 MG tablet Take 1 tablet (100 mg total) by mouth at bedtime. 30 tablet 2   No current facility-administered medications for this visit.     Musculoskeletal: Strength & Muscle Tone: within normal limits Gait & Station: normal Patient leans: N/A  Psychiatric Specialty Exam: Review of Systems  All other systems reviewed and are negative.   There were no vitals taken for this visit.There is no height or weight on file to calculate BMI.  General Appearance: NA  Eye Contact:  NA  Speech:  Clear and Coherent  Volume:  Normal  Mood:  Euthymic  Affect:  Appropriate and Congruent  Thought Process:  Goal Directed  Orientation:  Full (Time, Place, and Person)  Thought Content: WDL   Suicidal Thoughts:  No  Homicidal Thoughts:  No  Memory:  Immediate;   Good Recent;   Good Remote;   Fair  Judgement:  Fair  Insight:  Fair  Psychomotor Activity:  Normal  Concentration:  Concentration: Good and Attention Span: Good  Recall:  [place] of Knowledge: Fair  Language: Good  Akathisia:  No  Handed:  Right  AIMS (if indicated): not done  Assets:  Communication Skills Desire for Improvement Physical  Health Resilience Social Support Talents/Skills  ADL's:  Intact  Cognition: Impaired,  Mild  Sleep:  Fair   Screenings: PHQ2-9     Office Visit from 08/15/2019 in [facility] from 05/28/2019 in [facility] Office Visit from 03/18/2019 in [facility] Visit from 01/15/2019 in [facility] Visit from 10/16/2018 in [facility]  PHQ-2 Total Score  3  0  6  6  0  PHQ-9 Total Score  9  --  23  22  --       Assessment and Plan: This patient is a 59 year old male with a history of depression anxiety and mild cognitive impairment.  He is doing well on his current regimen.  He will continue Prozac 40 mg daily for depression, trazodone 50 to 100 mg at bedtime for sleep and Xanax 1 mg daily as needed for anxiety.  He will return to see me in 3 months   Levonne Spiller, MD 08/21/2019, 9:16 AM

## 2019-09-02 DIAGNOSIS — M79671 Pain in right foot: Secondary | ICD-10-CM | POA: Diagnosis not present

## 2019-09-02 DIAGNOSIS — M722 Plantar fascial fibromatosis: Secondary | ICD-10-CM | POA: Diagnosis not present

## 2019-09-25 ENCOUNTER — Ambulatory Visit: Payer: Medicare Other | Admitting: Urology

## 2019-10-22 ENCOUNTER — Other Ambulatory Visit: Payer: Self-pay | Admitting: "Endocrinology

## 2019-11-01 ENCOUNTER — Ambulatory Visit: Payer: Medicare Other | Admitting: "Endocrinology

## 2019-11-19 ENCOUNTER — Other Ambulatory Visit: Payer: Self-pay | Admitting: "Endocrinology

## 2019-11-19 ENCOUNTER — Telehealth: Payer: Self-pay | Admitting: "Endocrinology

## 2019-11-19 NOTE — Telephone Encounter (Signed)
Advise to increase Lantus to 50 units qhs, start monitoring BG 4 times a day - before meals and HS, call back if >200mg /dl x 3.

## 2019-11-19 NOTE — Telephone Encounter (Signed)
Called pt, he did not have his readings or meter with him. Advised pt to call me back as soon as he could with the past 3-4 days of his readings. Understanding voiced.

## 2019-11-19 NOTE — Telephone Encounter (Signed)
Discussed with pt, understanding voiced. 

## 2019-11-19 NOTE — Telephone Encounter (Signed)
Pt left a VM regarding high readings. Can you call pt

## 2019-11-19 NOTE — Telephone Encounter (Signed)
5/28 -  234 5/29 - 316 5/30 - 349 5/31 - 240 6/1 - 349 

## 2019-11-20 ENCOUNTER — Telehealth (INDEPENDENT_AMBULATORY_CARE_PROVIDER_SITE_OTHER): Payer: Medicare Other | Admitting: Psychiatry

## 2019-11-20 ENCOUNTER — Other Ambulatory Visit: Payer: Self-pay

## 2019-11-20 ENCOUNTER — Encounter (HOSPITAL_COMMUNITY): Payer: Self-pay | Admitting: Psychiatry

## 2019-11-20 DIAGNOSIS — F418 Other specified anxiety disorders: Secondary | ICD-10-CM | POA: Diagnosis not present

## 2019-11-20 MED ORDER — TRAZODONE HCL 150 MG PO TABS
150.0000 mg | ORAL_TABLET | Freq: Every day | ORAL | 2 refills | Status: DC
Start: 2019-11-20 — End: 2019-12-18

## 2019-11-20 MED ORDER — ALPRAZOLAM 1 MG PO TABS: ORAL_TABLET | ORAL | 2 refills | Status: DC

## 2019-11-20 MED ORDER — FLUOXETINE HCL 40 MG PO CAPS: 40.0000 mg | ORAL_CAPSULE | Freq: Every day | ORAL | 2 refills | Status: DC

## 2019-11-20 NOTE — Progress Notes (Signed)
Virtual Visit via Telephone Note  I connected with John [name] on 11/20/19 at  8:40 AM EDT by telephone and verified that I am speaking with the correct person using two identifiers.   I discussed the limitations, risks, security and privacy concerns of performing an evaluation and management service by telephone and the availability of in person appointments. I also discussed with the patient that there may be a patient responsible charge related to this service. The patient expressed understanding and     I discussed the assessment and treatment plan with the patient. The patient was provided an opportunity to ask questions and all were answered. The patient agreed with the plan and demonstrated an understanding of the instructions.   The patient was advised to call back or seek an in-person evaluation if the symptoms worsen or if the condition fails to improve as anticipated.  I provided 15 minutes of non-face-to-face time during this encounter. Location: provider home, patient home  Diannia Ruder, MD  [facility] MD/PA/NP OP Progress Note  11/20/2019 9:08 AM John [name]  MRN:  [id number]  Chief Complaint:  Chief Complaint    Depression; Anxiety; Follow-up     HPI: This patient is a 59 year old married black male who lives with his wife in [place].  He had 1 son who died at age 28 in 80 in a motor vehicle accident.  He is currently on disability.  The patient returns for follow-up after 3 months.  He states he is not doing as well recently.  Over the last several weeks he states that his wife began acting strangely.  It sounds as if she became psychotic.  She became obsessed with the game that she was playing on her phone and would do anything else.  She began to get increasingly paranoid and 1 come out of her bedroom.  Eventually the patient had to file a petition for involuntary commitment and she is now at [facility].  Several of her relatives have come in from out of town  and are staying at his house.  He states that he feels upset about the whole situation and has had difficulty sleeping and feels somewhat more depressed.  He does think the medications are helping but he still not sleeping all that well.  I suggested that we go up on his trazodone to help him.  He denies thoughts of self-harm or suicidal ideation Visit Diagnosis:    ICD-10-CM   1. Depression with anxiety  F41.8 FLUoxetine (PROZAC) 40 MG capsule    Past Psychiatric History: none  Past Medical History:  Past Medical History:  Diagnosis Date  . Anxiety   . Depression 2007  . Diabetes mellitus without complication (HCC)   . Helicobacter pylori ab+ 01/21/2019   Dx in 12/2018, will treat  . Hyperlipidemia   . Hypertension   . Hypogonadism male   . Obesity (BMI 30.0-34.9) 03/01/2017    Past Surgical History:  Procedure Laterality Date  . CIRCUMCISION N/A 06/10/2019   Procedure: CIRCUMCISION ADULT;  Surgeon: Malen Gauze, MD;  Location: [facility];  Service: Urology;  Laterality: N/A;  30 MINS  . COLONOSCOPY  01/20/2011   Procedure: COLONOSCOPY;  Surgeon: Arlyce Harman, MD;  [facility];  Service: Endoscopy;  Laterality: N/A;  . HERNIA REPAIR     ventral hernia repair   . VASECTOMY      Family Psychiatric History: none  Family History:  Family History  Problem Relation Age of  Onset  . Heart failure Mother        living   . Hypertension Mother   . Hyperlipidemia Mother   . Heart disease Mother   . Hypertension Sister   . Hypertension Brother   . Colon cancer Neg Hx     Social History:  Social History   Socioeconomic History  . Marital status: Married    Spouse name: Not on file  . Number of children: 1  . Years of education: Not on file  . Highest education level: Not on file  Occupational History  . Occupation: Production assistant, radio  Tobacco Use  . Smoking status: Never Smoker  . Smokeless tobacco: Never Used  Substance and Sexual Activity  . Alcohol use: Yes     Alcohol/week: 1.0 standard drinks    Types: 1 Cans of beer per week    Comment: occasional beer socially.   . Drug use: No  . Sexual activity: Yes  Other Topics Concern  . Not on file  Social History Narrative  . Not on file   Social Determinants of Health   Financial Resource Strain:   . Difficulty of Paying Living Expenses:   Food Insecurity:   . Worried About Charity fundraiser in the Last Year:   . Arboriculturist in the Last Year:   Transportation Needs:   . Film/video editor (Medical):   [name] Lack of Transportation (Non-Medical):   Physical Activity:   . Days of Exercise per Week:   . Minutes of Exercise per Session:   Stress:   . Feeling of Stress :   Social Connections:   . Frequency of Communication with Friends and Family:   . Frequency of Social Gatherings with Friends and Family:   . Attends Religious Services:   . Active Member of Clubs or Organizations:   . Attends Archivist Meetings:   [name] Marital Status:     Allergies:  Allergies  Allergen Reactions  . Januvia [Sitagliptin] Nausea And Vomiting  . Poison Oak Extract [Poison Oak Extract] Dermatitis    Metabolic Disorder Labs: Lab Results  Component Value Date   HGBA1C 5.6 06/10/2019   MPG 114.02 06/10/2019   MPG 140 02/26/2019   No results found for: PROLACTIN Lab Results  Component Value Date   CHOL 134 03/18/2019   TRIG 93 03/18/2019   HDL 33 (L) 03/18/2019   CHOLHDL 4.1 03/18/2019   VLDL 64 (H) 12/30/2016   LDLCALC 82 03/18/2019   LDLCALC 95 06/12/2018   Lab Results  Component Value Date   TSH 2.84 03/18/2019   TSH 2.35 01/26/2016    Therapeutic Level Labs: No results found for: LITHIUM No results found for: VALPROATE No components found for:  CBMZ  Current Medications: Current Outpatient Medications  Medication Sig Dispense Refill  . ACCU-CHEK SOFTCLIX LANCETS lancets 100 each by Other route 4 (four) times daily. Use as instructed 100 each 5  . ALPRAZolam  (XANAX) 1 MG tablet TAKE 1 TABLET BY MOUTH ONCE DAILY AS NEEDED FOR ANXIETY 30 tablet 2  . atorvastatin (LIPITOR) 20 MG tablet Take 1 tablet (20 mg total) by mouth daily. 30 tablet 3  . bacitracin 500 UNIT/GM ointment Apply 1 application topically 2 (two) times daily. 15 g 3  . Blood Glucose Monitoring Suppl (ACCU-CHEK AVIVA) device 1 each by Other route 2 (two) times daily. Use as instructed bid. E11.65 1 each 0  . FLUoxetine (PROZAC) 40 MG capsule Take 1 capsule (40  mg total) by mouth daily. 30 capsule 2  . glucose blood (ACCU-CHEK AVIVA) test strip Use to test blood glucose 4 times a day. E11.65 150 each 5  . ibuprofen (ADVIL) 800 MG tablet Take one tablet by mouth three times daily for 5 days only, for bilateral foot pain 30 tablet 0  . Insulin Glargine (LANTUS SOLOSTAR) 100 UNIT/ML Solostar Pen Inject 30 Units into the skin at bedtime. 15 mL 2  . lisinopril-hydrochlorothiazide (ZESTORETIC) 10-12.5 MG tablet Take 1 tablet by mouth once daily 90 tablet 1  . metFORMIN (GLUCOPHAGE) 500 MG tablet TAKE 1 TABLET BY MOUTH TWICE DAILY WITH A MEAL 180 tablet 0  . traZODone (DESYREL) 150 MG tablet Take 1 tablet (150 mg total) by mouth at bedtime. 30 tablet 2   No current facility-administered medications for this visit.     Musculoskeletal: Strength & Muscle Tone: within normal limits Gait & Station: normal Patient leans: N/A  Psychiatric Specialty Exam: Review of Systems  Psychiatric/Behavioral: Positive for dysphoric mood and sleep disturbance.  All other systems reviewed and are negative.   There were no vitals taken for this visit.There is no height or weight on file to calculate BMI.  General Appearance: NA  Eye Contact:  NA  Speech:  Clear and Coherent  Volume:  Decreased  Mood:  Dysphoric  Affect:  NA  Thought Process:  Goal Directed  Orientation:  Full (Time, Place, and Person)  Thought Content: Rumination   Suicidal Thoughts:  No  Homicidal Thoughts:  No  Memory:  Immediate;    Good Recent;   Fair Remote;   Fair  Judgement:  Fair  Insight:  Shallow  Psychomotor Activity:  Decreased  Concentration:  Concentration: Fair and Attention Span: Fair  Recall:  [employer] of Knowledge: Fair  Language: Good  Akathisia:  No  Handed:  Right  AIMS (if indicated): not done  Assets:  Communication Skills Desire for Improvement Physical Health Resilience Social Support Talents/Skills  ADL's:  Intact  Cognition: Impaired,  Mild  Sleep:  Poor   Screenings: PHQ2-9     Office Visit from 08/15/2019 in [facility] Clinical Support from 05/28/2019 in [facility] Office Visit from 03/18/2019 in [facility] Office Visit from 01/15/2019 in [facility] Office Visit from 10/16/2018 in [facility]  PHQ-2 Total Score  3  0  6  6  0  PHQ-9 Total Score  9  --  23  22  --       Assessment and Plan: This patient is a 59 year old male with a history of depression anxiety and mild cognitive impairment.  He has been struggling since his wife had to go into a psychiatric hospital.  I reminded him that he needed to try to his pull himself together to take care of her when she returns.  He will increase trazodone to 150 mg at bedtime to help with sleep.  He will continue Prozac 40 mg daily for depression and Xanax 1 mg daily as needed for anxiety.  He will return to see me in 4 weeks   Diannia Ruder, MD 11/20/2019, 9:08 AM

## 2019-11-22 ENCOUNTER — Other Ambulatory Visit: Payer: Self-pay | Admitting: Emergency Medicine

## 2019-11-22 DIAGNOSIS — I10 Essential (primary) hypertension: Secondary | ICD-10-CM

## 2019-11-22 MED ORDER — ATORVASTATIN CALCIUM 20 MG PO TABS: 20.0000 mg | ORAL_TABLET | Freq: Every day | ORAL | 0 refills | Status: DC

## 2019-11-28 ENCOUNTER — Other Ambulatory Visit (HOSPITAL_COMMUNITY): Payer: Self-pay | Admitting: Psychiatry

## 2019-11-28 ENCOUNTER — Other Ambulatory Visit: Payer: Self-pay | Admitting: "Endocrinology

## 2019-11-28 DIAGNOSIS — E1169 Type 2 diabetes mellitus with other specified complication: Secondary | ICD-10-CM

## 2019-11-28 MED ORDER — LANTUS SOLOSTAR 100 UNIT/ML ~~LOC~~ SOPN: 50.0000 [IU] | PEN_INJECTOR | Freq: Every day | SUBCUTANEOUS | 0 refills | Status: DC

## 2019-11-28 NOTE — Telephone Encounter (Signed)
Sent in script with no refills, pt needs appointment

## 2019-11-28 NOTE — Telephone Encounter (Signed)
Patient is requesting a refill on the following medication.  Insulin Glargine (LANTUS SOLOSTAR) 100 UNIT/ML Solostar Pen  [id number] - [place], [place] - [address] [place] [address] Phone:  [phone]  Fax:  [phone]

## 2019-12-06 ENCOUNTER — Other Ambulatory Visit (HOSPITAL_COMMUNITY): Payer: Self-pay | Admitting: Psychiatry

## 2019-12-10 DIAGNOSIS — Z23 Encounter for immunization: Secondary | ICD-10-CM | POA: Diagnosis not present

## 2019-12-18 ENCOUNTER — Encounter (HOSPITAL_COMMUNITY): Payer: Self-pay | Admitting: Psychiatry

## 2019-12-18 ENCOUNTER — Other Ambulatory Visit: Payer: Self-pay

## 2019-12-18 ENCOUNTER — Telehealth (INDEPENDENT_AMBULATORY_CARE_PROVIDER_SITE_OTHER): Payer: Medicare Other | Admitting: Psychiatry

## 2019-12-18 DIAGNOSIS — F418 Other specified anxiety disorders: Secondary | ICD-10-CM

## 2019-12-18 MED ORDER — TRAZODONE HCL 150 MG PO TABS: 150.0000 mg | ORAL_TABLET | Freq: Every day | ORAL | 2 refills | Status: DC

## 2019-12-18 MED ORDER — FLUOXETINE HCL 40 MG PO CAPS: 40.0000 mg | ORAL_CAPSULE | Freq: Every day | ORAL | 2 refills | Status: DC

## 2019-12-18 MED ORDER — ALPRAZOLAM 1 MG PO TABS: ORAL_TABLET | ORAL | 2 refills | Status: DC

## 2019-12-18 NOTE — Progress Notes (Signed)
Virtual Visit via Telephone Note  I connected with John [name] on 12/18/19 at 10:20 AM EDT by telephone and verified that I am speaking with the correct person using two identifiers.   I discussed the limitations, risks, security and privacy concerns of performing an evaluation and management service by telephone and the availability of in person appointments. I also discussed with the patient that there may be a patient responsible charge related to this service. The patient expressed understanding and agreed to proceed    I discussed the assessment and treatment plan with the patient. The patient was provided an opportunity to ask questions and all were answered. The patient agreed with the plan and demonstrated an understanding of the instructions.   The patient was advised to call back or seek an in-person evaluation if the symptoms worsen or if the condition fails to improve as anticipated.  I provided 15 minutes of non-face-to-face time during this encounter. Location: Provider home, patient home  Diannia Ruder, MD  [facility] MD/PA/NP OP Progress Note  12/18/2019 11:03 AM John [name]  MRN:  [id number]  Chief Complaint:  Chief Complaint    Depression; Anxiety; Follow-up     HPI: This patient is a 59 year old married black male who lives with his wife in [place].  He had 1 son who died at age 2 in 81 in a motor vehicle accident.  He is currently on disability  The patient returns for follow-up after 3 weeks. Last time he told me his wife had a psychotic break and was hospitalized at [facility] in [place]. He states that she stayed there about 10 days and she is back now. She is somewhat better but is still pacing the house all the time and is unable to sleep at night. It sounds as if she is still psychotic and disorganized. He is not getting much rest because he is keeping an eye on her all night. He is claiming that they did not give him any follow-up for her. He has  been trying to call her primary doctor, Dr. Lodema Hong to see what can be arranged.  For the most part the patient he is holding his own and trying to take care of his wife. He denies serious depression or suicidal ideation but is not getting much rest. I told him I would send Dr. Lodema Hong a message to see if she could work something out to get the wife referred to our office to see our other psychiatrist as soon as possible. Visit Diagnosis:    ICD-10-CM   1. Depression with anxiety  F41.8 FLUoxetine (PROZAC) 40 MG capsule    Past Psychiatric History: none  Past Medical History:  Past Medical History:  Diagnosis Date  . Anxiety   . Depression 2007  . Diabetes mellitus without complication (HCC)   . Helicobacter pylori ab+ 01/21/2019   Dx in 12/2018, will treat  . Hyperlipidemia   . Hypertension   . Hypogonadism male   . Obesity (BMI 30.0-34.9) 03/01/2017    Past Surgical History:  Procedure Laterality Date  . CIRCUMCISION N/A 06/10/2019   Procedure: CIRCUMCISION ADULT;  Surgeon: Malen Gauze, MD;  Location: [facility] ORS;  Service: Urology;  Laterality: N/A;  30 MINS  . COLONOSCOPY  01/20/2011   Procedure: COLONOSCOPY;  Surgeon: Arlyce Harman, MD;  Location: [facility] ENDO SUITE;  Service: Endoscopy;  Laterality: N/A;  . HERNIA REPAIR     ventral hernia repair   . VASECTOMY  Family Psychiatric History: see below  Family History:  Family History  Problem Relation Age of Onset  . Heart failure Mother        living   . Hypertension Mother   . Hyperlipidemia Mother   . Heart disease Mother   . Hypertension Sister   . Hypertension Brother   . Colon cancer Neg Hx     Social History:  Social History   Socioeconomic History  . Marital status: Married    Spouse name: Not on file  . Number of children: 1  . Years of education: Not on file  . Highest education level: Not on file  Occupational History  . Occupation: Dispensing optician  Tobacco Use  . Smoking status: Never Smoker  .  Smokeless tobacco: Never Used  Vaping Use  . Vaping Use: Never used  Substance and Sexual Activity  . Alcohol use: Yes    Alcohol/week: 1.0 standard drink    Types: 1 Cans of beer per week    Comment: occasional beer socially.   . Drug use: No  . Sexual activity: Yes  Other Topics Concern  . Not on file  Social History Narrative  . Not on file   Social Determinants of Health   Financial Resource Strain:   . Difficulty of Paying Living Expenses:   Food Insecurity:   . Worried About Programme researcher, broadcasting/film/video in the Last Year:   . Barista in the Last Year:   Transportation Needs:   . Freight forwarder (Medical):   [name] Lack of Transportation (Non-Medical):   Physical Activity:   . Days of Exercise per Week:   . Minutes of Exercise per Session:   Stress:   . Feeling of Stress :   Social Connections:   . Frequency of Communication with Friends and Family:   . Frequency of Social Gatherings with Friends and Family:   . Attends Religious Services:   . Active Member of Clubs or Organizations:   . Attends Banker Meetings:   [name] Marital Status:     Allergies:  Allergies  Allergen Reactions  . Januvia [Sitagliptin] Nausea And Vomiting  . Poison Oak Extract [Poison Oak Extract] Dermatitis    Metabolic Disorder Labs: Lab Results  Component Value Date   HGBA1C 5.6 06/10/2019   MPG 114.02 06/10/2019   MPG 140 02/26/2019   No results found for: PROLACTIN Lab Results  Component Value Date   CHOL 134 03/18/2019   TRIG 93 03/18/2019   HDL 33 (L) 03/18/2019   CHOLHDL 4.1 03/18/2019   VLDL 64 (H) 12/30/2016   LDLCALC 82 03/18/2019   LDLCALC 95 06/12/2018   Lab Results  Component Value Date   TSH 2.84 03/18/2019   TSH 2.35 01/26/2016    Therapeutic Level Labs: No results found for: LITHIUM No results found for: VALPROATE No components found for:  CBMZ  Current Medications: Current Outpatient Medications  Medication Sig Dispense Refill  .  ACCU-CHEK SOFTCLIX LANCETS lancets 100 each by Other route 4 (four) times daily. Use as instructed 100 each 5  . ALPRAZolam (XANAX) 1 MG tablet TAKE 1 TABLET BY MOUTH ONCE DAILY AS NEEDED FOR ANXIETY 30 tablet 2  . atorvastatin (LIPITOR) 20 MG tablet Take 1 tablet (20 mg total) by mouth daily. 90 tablet 0  . bacitracin 500 UNIT/GM ointment Apply 1 application topically 2 (two) times daily. 15 g 3  . Blood Glucose Monitoring Suppl (ACCU-CHEK AVIVA) device 1 each by  Other route 2 (two) times daily. Use as instructed bid. E11.65 1 each 0  . FLUoxetine (PROZAC) 40 MG capsule Take 1 capsule (40 mg total) by mouth daily. 30 capsule 2  . glucose blood (ACCU-CHEK AVIVA) test strip Use to test blood glucose 4 times a day. E11.65 150 each 5  . ibuprofen (ADVIL) 800 MG tablet Take one tablet by mouth three times daily for 5 days only, for bilateral foot pain 30 tablet 0  . insulin glargine (LANTUS SOLOSTAR) 100 UNIT/ML Solostar Pen Inject 50 Units into the skin at bedtime. 15 mL 0  . lisinopril-hydrochlorothiazide (ZESTORETIC) 10-12.5 MG tablet Take 1 tablet by mouth once daily 90 tablet 1  . metFORMIN (GLUCOPHAGE) 500 MG tablet TAKE 1 TABLET BY MOUTH TWICE DAILY WITH A MEAL 180 tablet 0  . traZODone (DESYREL) 150 MG tablet Take 1 tablet (150 mg total) by mouth at bedtime. 30 tablet 2   No current facility-administered medications for this visit.     Musculoskeletal: Strength & Muscle Tone: within normal limits Gait & Station: normal Patient leans: N/A  Psychiatric Specialty Exam: Review of Systems  Psychiatric/Behavioral: Positive for sleep disturbance.  All other systems reviewed and are negative.   There were no vitals taken for this visit.There is no height or weight on file to calculate BMI.  General Appearance: NA  Eye Contact:  NA  Speech:  Clear and Coherent  Volume:  Normal  Mood:  Anxious  Affect:  NA  Thought Process:  Goal Directed  Orientation:  Full (Time, Place, and Person)   Thought Content: Rumination   Suicidal Thoughts:  No  Homicidal Thoughts:  No  Memory:  Immediate;   Good Recent;   Good Remote;   Fair  Judgement:  Good  Insight:  Fair  Psychomotor Activity:  Normal  Concentration:  Concentration: Good and Attention Span: Good  Recall:  Fair  Fund of Knowledge: Fair  Language: Good  Akathisia:  No  Handed:  Right  AIMS (if indicated): not done  Assets:  Communication Skills Desire for Improvement Resilience Social Support Talents/Skills  ADL's:  Intact  Cognition: WNL  Sleep:  Poor   Screenings: PHQ2-9     Office Visit from 08/15/2019 in [facility] Clinical Support from 05/28/2019 in [facility] Office Visit from 03/18/2019 in [facility] Office Visit from 01/15/2019 in [facility] Office Visit from 10/16/2018 in [facility]  PHQ-2 Total Score 3 0 6 6 0  PHQ-9 Total Score 9 -- 23 22 --       Assessment and Plan: This patient is a 59 year old male with a history of depression anxiety and mild cognitive impairment. He is still struggling to maintain care of his wife who has some sort of psychotic illness but possibly a psychotic break. I will try to assist him with getting her some psychiatric help through her primary physician. In the meantime we will continue Prozac 40 mg daily for depression trazodone 150 mg at bedtime for sleep and Xanax 1 mg daily as needed for anxiety. He will return to see me in 3 months.   Diannia Ruder, MD 12/18/2019, 11:03 AM

## 2019-12-22 ENCOUNTER — Other Ambulatory Visit: Payer: Self-pay | Admitting: "Endocrinology

## 2020-01-07 DIAGNOSIS — I10 Essential (primary) hypertension: Secondary | ICD-10-CM | POA: Diagnosis not present

## 2020-01-07 DIAGNOSIS — E1169 Type 2 diabetes mellitus with other specified complication: Secondary | ICD-10-CM | POA: Diagnosis not present

## 2020-01-07 DIAGNOSIS — E669 Obesity, unspecified: Secondary | ICD-10-CM | POA: Diagnosis not present

## 2020-01-08 LAB — LIPID PANEL
Cholesterol: 163 mg/dL (ref <200)
HDL: 39 mg/dL — ABNORMAL LOW (ref >=40)
LDL Cholesterol (Calc): 99 mg/dL (calc)
Non-HDL Cholesterol (Calc): 124 mg/dL (calc) (ref <130)
Total CHOL/HDL Ratio: 4.2 (calc) (ref <5.0)
Triglycerides: 153 mg/dL — ABNORMAL HIGH (ref <150)

## 2020-01-08 LAB — COMPLETE METABOLIC PANEL WITH GFR
AG Ratio: 1.5 (calc) (ref 1.0–2.5)
ALT: 21 U/L (ref 9–46)
AST: 18 U/L (ref 10–35)
Albumin: 4.1 g/dL (ref 3.6–5.1)
Alkaline phosphatase (APISO): 93 U/L (ref 35–144)
BUN/Creatinine Ratio: 7 (calc) (ref 6–22)
BUN: 9 mg/dL (ref 7–25)
CO2: 29 mmol/L (ref 20–32)
Calcium: 9.5 mg/dL (ref 8.6–10.3)
Chloride: 103 mmol/L (ref 98–110)
Creat: 1.38 mg/dL — ABNORMAL HIGH (ref 0.70–1.33)
GFR, Est African American: 64 mL/min/{1.73_m2} (ref >=60)
GFR, Est Non African American: 56 mL/min/{1.73_m2} — ABNORMAL LOW (ref >=60)
Globulin: 2.8 g/dL (calc) (ref 1.9–3.7)
Glucose, Bld: 98 mg/dL (ref 65–99)
Potassium: 4.1 mmol/L (ref 3.5–5.3)
Sodium: 141 mmol/L (ref 135–146)
Total Bilirubin: 0.6 mg/dL (ref 0.2–1.2)
Total Protein: 6.9 g/dL (ref 6.1–8.1)

## 2020-02-12 ENCOUNTER — Ambulatory Visit (INDEPENDENT_AMBULATORY_CARE_PROVIDER_SITE_OTHER): Payer: Medicare Other | Admitting: Family Medicine

## 2020-02-12 ENCOUNTER — Encounter: Payer: Self-pay | Admitting: Family Medicine

## 2020-02-12 ENCOUNTER — Other Ambulatory Visit: Payer: Self-pay

## 2020-02-12 VITALS — BP 121/77 | HR 77 | Resp 16 | Ht 68.0 in | Wt 199.0 lb

## 2020-02-12 DIAGNOSIS — G4700 Insomnia, unspecified: Secondary | ICD-10-CM | POA: Diagnosis not present

## 2020-02-12 DIAGNOSIS — I10 Essential (primary) hypertension: Secondary | ICD-10-CM

## 2020-02-12 DIAGNOSIS — E782 Mixed hyperlipidemia: Secondary | ICD-10-CM | POA: Diagnosis not present

## 2020-02-12 DIAGNOSIS — Z23 Encounter for immunization: Secondary | ICD-10-CM

## 2020-02-12 DIAGNOSIS — E559 Vitamin D deficiency, unspecified: Secondary | ICD-10-CM

## 2020-02-12 DIAGNOSIS — F324 Major depressive disorder, single episode, in partial remission: Secondary | ICD-10-CM

## 2020-02-12 DIAGNOSIS — E669 Obesity, unspecified: Secondary | ICD-10-CM | POA: Diagnosis not present

## 2020-02-12 DIAGNOSIS — Z125 Encounter for screening for malignant neoplasm of prostate: Secondary | ICD-10-CM | POA: Diagnosis not present

## 2020-02-12 DIAGNOSIS — E1169 Type 2 diabetes mellitus with other specified complication: Secondary | ICD-10-CM

## 2020-02-12 LAB — POCT GLYCOSYLATED HEMOGLOBIN (HGB A1C): Hemoglobin A1C: 6.4 % — AB (ref 4.0–5.6)

## 2020-02-12 NOTE — Assessment & Plan Note (Signed)
[name] is reminded of the importance of commitment to daily physical activity for 30 minutes or more, as able and the need to limit carbohydrate intake to 30 to 60 grams per meal to help with blood sugar control.   The need to take medication as prescribed, test blood sugar as directed, and to call between visits if there is a concern that blood sugar is uncontrolled is also discussed.   [name] is reminded of the importance of daily foot exam, annual eye examination, and good blood sugar, blood pressure and cholesterol control. Controlled, no change in medication   Diabetic Labs Latest Ref Rng & Units 02/12/2020 01/07/2020 06/10/2019 03/18/2019 02/26/2019  HbA1c 4.0 - 5.6 % 6.4(A) - 5.6 - 6.5(H)  Microalbumin mg/dL - - - 01.0 -  Micro/Creat Ratio <30 mcg/mg creat - - - 27 -  Chol <200 mg/dL - 272 - 536 -  HDL > OR = 40 mg/dL - 64(Q) - 03(K) -  Calc LDL mg/dL (calc) - 99 - 82 -  Triglycerides <150 mg/dL - 742(V) - 93 -  Creatinine 0.70 - 1.33 mg/dL - 9.56(L) 8.75 - 6.43   BP/Weight 02/12/2020 08/15/2019 06/26/2019 06/11/2019 06/10/2019 05/28/2019 03/18/2019  Systolic BP 121 120 133 140 163 122 122  Diastolic BP 77 82 83 96 75 74 74  Wt. (Lbs) 199.04 193 193 190 - 196 196  BMI 30.26 29.35 29.35 28.89 - 29.8 29.8  Some encounter information is confidential and restricted. Go to Review Flowsheets activity to see all data.   Foot/eye exam completion dates Latest Ref Rng & Units 07/09/2019 03/18/2019  Eye Exam No Retinopathy No Retinopathy -  Foot Form Completion - - Done

## 2020-02-12 NOTE — Assessment & Plan Note (Signed)
Controlled, no change in medication DASH diet and commitment to daily physical activity for a minimum of 30 minutes discussed and encouraged, as a part of hypertension management. The importance of attaining a healthy weight is also discussed.  BP/Weight 02/12/2020 08/15/2019 06/26/2019 06/11/2019 06/10/2019 05/28/2019 03/18/2019  Systolic BP 121 120 133 140 163 122 122  Diastolic BP 77 82 83 96 75 74 74  Wt. (Lbs) 199.04 193 193 190 - 196 196  BMI 30.26 29.35 29.35 28.89 - 29.8 29.8  Some encounter information is confidential and restricted. Go to Review Flowsheets activity to see all data.

## 2020-02-12 NOTE — Progress Notes (Signed)
John [name]     MRN: [id number]      DOB: 04/10/1961   HPI John [name] is here for follow up and re-evaluation of chronic medical conditions, medication management and review of any available recent lab and radiology data.  Preventive health is updated, specifically  Cancer screening and Immunization.   Questions or concerns regarding consultations or procedures which the PT has had in the interim are  addressed. The PT denies any adverse reactions to current medications since the last visit.  There are no new concerns.  There are no specific complaints  Denies polyuria, polydipsia, blurred vision , or hypoglycemic episodes.  ROS Denies recent fever or chills. Denies sinus pressure, nasal congestion, ear pain or sore throat. Denies chest congestion, productive cough or wheezing. Denies chest pains, palpitations and leg swelling Denies abdominal pain, nausea, vomiting,diarrhea or constipation.   Denies dysuria, frequency, hesitancy or incontinence. Denies joint pain, swelling and limitation in mobility. Denies headaches, seizures, numbness, or tingling. Denies depression, anxiety or insomnia. Denies skin break down or rash.   PE  BP 121/77   Pulse 77   Resp 16   Ht 5\' 8"  (1.727 m)   Wt 199 lb 0.6 oz (90.3 kg)   SpO2 96%   BMI 30.26 kg/m   Patient alert and oriented and in no cardiopulmonary distress.  HEENT: No facial asymmetry, EOMI,     Neck supple .  Chest: Clear to auscultation bilaterally.  CVS: S1, S2 no murmurs, no S3.Regular rate.  ABD: Soft non tender.   Ext: No edema  MS: Adequate ROM spine, shoulders, hips and knees.  Skin: Intact, no ulcerations or rash noted.  Psych: Good eye contact, normal affect. Memory intact not anxious or depressed appearing.  CNS: CN 2-12 intact, power,  normal throughout.no focal deficits noted.   Assessment & Plan  Essential hypertension Controlled, no change in medication DASH diet and commitment to daily  physical activity for a minimum of 30 minutes discussed and encouraged, as a part of hypertension management. The importance of attaining a healthy weight is also discussed.  BP/Weight 02/12/2020 08/15/2019 06/26/2019 06/11/2019 06/10/2019 05/28/2019 03/18/2019  Systolic BP 121 120 133 140 163 122 122  Diastolic BP 77 82 83 96 75 74 74  Wt. (Lbs) 199.04 193 193 190 - 196 196  BMI 30.26 29.35 29.35 28.89 - 29.8 29.8  Some encounter information is confidential and restricted. Go to Review Flowsheets activity to see all data.       Diabetes mellitus type 2 in obese [facility]) John [name] is reminded of the importance of commitment to daily physical activity for 30 minutes or more, as able and the need to limit carbohydrate intake to 30 to 60 grams per meal to help with blood sugar control.   The need to take medication as prescribed, test blood sugar as directed, and to call between visits if there is a concern that blood sugar is uncontrolled is also discussed.   John [name] is reminded of the importance of daily foot exam, annual eye examination, and good blood sugar, blood pressure and cholesterol control. Controlled, no change in medication   Diabetic Labs Latest Ref Rng & Units 02/12/2020 01/07/2020 06/10/2019 03/18/2019 02/26/2019  HbA1c 4.0 - 5.6 % 6.4(A) - 5.6 - 6.5(H)  Microalbumin mg/dL - - - 04/28/2019 -  Micro/Creat Ratio <30 mcg/mg creat - - - 27 -  Chol <200 mg/dL - 25.8 - 527 -  HDL > OR = 40 mg/dL -  39(L) - 33(L) -  Calc LDL mg/dL (calc) - 99 - 82 -  Triglycerides <150 mg/dL - 166(A) - 93 -  Creatinine 0.70 - 1.33 mg/dL - 6.30(Z) 6.01 - 0.93   BP/Weight 02/12/2020 08/15/2019 06/26/2019 06/11/2019 06/10/2019 05/28/2019 03/18/2019  Systolic BP 121 120 133 140 163 122 122  Diastolic BP 77 82 83 96 75 74 74  Wt. (Lbs) 199.04 193 193 190 - 196 196  BMI 30.26 29.35 29.35 28.89 - 29.8 29.8  Some encounter information is confidential and restricted. Go to Review Flowsheets activity to see all data.     Foot/eye exam completion dates Latest Ref Rng & Units 07/09/2019 03/18/2019  Eye Exam No Retinopathy No Retinopathy -  Foot Form Completion - - Done        Obesity (BMI 30.0-34.9)  Patient re-educated about  the importance of commitment to a  minimum of 150 minutes of exercise per week as able.  The importance of healthy food choices with portion control discussed, as well as eating regularly and within a 12 hour window most days. The need to choose "clean , green" food 50 to 75% of the time is discussed, as well as to make water the primary drink and set a goal of 64 ounces water daily.    Weight /BMI 02/12/2020 08/15/2019 06/26/2019  WEIGHT 199 lb 0.6 oz 193 lb 193 lb  HEIGHT 5\' 8"  5\' 8"  5\' 8"   BMI 30.26 kg/m2 29.35 kg/m2 29.35 kg/m2  Some encounter information is confidential and restricted. Go to Review Flowsheets activity to see all data.      Mixed hyperlipidemia Hyperlipidemia:Low fat diet discussed and encouraged.   Lipid Panel  Lab Results  Component Value Date   CHOL 163 01/07/2020   HDL 39 (L) 01/07/2020   LDLCALC 99 01/07/2020   TRIG 153 (H) 01/07/2020   CHOLHDL 4.2 01/07/2020   Needs to reduce fried and fatty foods and increase exercise    Depression, major, single episode, in partial remission (HCC) Managed by Psych and controlled on current medication  Insomnia Sleep hygiene reviewed and written information offered also. Prescription sent for  medication needed.

## 2020-02-12 NOTE — Assessment & Plan Note (Signed)
Sleep hygiene reviewed and written information offered also. Prescription sent for  medication needed.  

## 2020-02-12 NOTE — Assessment & Plan Note (Signed)
Managed by Psych and controlled on current medication

## 2020-02-12 NOTE — Patient Instructions (Addendum)
F/U in early January, call if you need me sooner  Flu vaccine today  GlycoHb in office today  No medication changes   PSA, cBC, cmp and eGFR and TSH and vit D 5 to 7  days  Before Jan appt, non fasting and microalb   Please reduce fried and fattty foods  It is important that you exercise regularly at least 30 minutes 5 times a week. If you develop chest pain, have severe difficulty breathing, or feel very tired, stop exercising immediately and seek medical attention   Think about what you will eat, plan ahead. Choose " clean, green, fresh or frozen" over canned, processed or packaged foods which are more sugary, salty and fatty. 70 to 75% of food eaten should be vegetables and fruit. Three meals at set times with snacks allowed between meals, but they must be fruit or vegetables. Aim to eat over a 12 hour period , example 7 am to 7 pm, and STOP after  your last meal of the day. Drink water,generally about 64 ounces per day, no other drink is as healthy. Fruit juice is best enjoyed in a healthy way, by EATING the fruit. Thanks for choosing [facility], we consider it a privelige to serve you.

## 2020-02-12 NOTE — Assessment & Plan Note (Signed)
  Patient re-educated about  the importance of commitment to a  minimum of 150 minutes of exercise per week as able.  The importance of healthy food choices with portion control discussed, as well as eating regularly and within a 12 hour window most days. The need to choose "clean , green" food 50 to 75% of the time is discussed, as well as to make water the primary drink and set a goal of 64 ounces water daily.    Weight /BMI 02/12/2020 08/15/2019 06/26/2019  WEIGHT 199 lb 0.6 oz 193 lb 193 lb  HEIGHT 5\' 8"  5\' 8"  5\' 8"   BMI 30.26 kg/m2 29.35 kg/m2 29.35 kg/m2  Some encounter information is confidential and restricted. Go to Review Flowsheets activity to see all data.

## 2020-02-12 NOTE — Assessment & Plan Note (Signed)
Hyperlipidemia:Low fat diet discussed and encouraged.   Lipid Panel  Lab Results  Component Value Date   CHOL 163 01/07/2020   HDL 39 (L) 01/07/2020   LDLCALC 99 01/07/2020   TRIG 153 (H) 01/07/2020   CHOLHDL 4.2 01/07/2020   Needs to reduce fried and fatty foods and increase exercise

## 2020-02-27 ENCOUNTER — Other Ambulatory Visit: Payer: Self-pay | Admitting: "Endocrinology

## 2020-03-02 ENCOUNTER — Other Ambulatory Visit: Payer: Self-pay | Admitting: Family Medicine

## 2020-03-02 DIAGNOSIS — I10 Essential (primary) hypertension: Secondary | ICD-10-CM

## 2020-03-03 ENCOUNTER — Other Ambulatory Visit: Payer: Self-pay | Admitting: "Endocrinology

## 2020-03-08 ENCOUNTER — Other Ambulatory Visit: Payer: Self-pay | Admitting: "Endocrinology

## 2020-03-11 ENCOUNTER — Telehealth: Payer: Self-pay | Admitting: "Endocrinology

## 2020-03-11 NOTE — Telephone Encounter (Signed)
Pt is calling about his medication being denied. Patient has not been seen since January and need an appt. Does patient need blood work before appt./

## 2020-03-11 NOTE — Telephone Encounter (Signed)
Pt needs A1c in office.

## 2020-03-16 ENCOUNTER — Ambulatory Visit (INDEPENDENT_AMBULATORY_CARE_PROVIDER_SITE_OTHER): Payer: Medicare Other | Admitting: Nurse Practitioner

## 2020-03-16 ENCOUNTER — Encounter: Payer: Self-pay | Admitting: Nurse Practitioner

## 2020-03-16 ENCOUNTER — Other Ambulatory Visit: Payer: Self-pay

## 2020-03-16 VITALS — BP 193/95 | HR 73 | Ht 68.0 in | Wt 205.6 lb

## 2020-03-16 DIAGNOSIS — I10 Essential (primary) hypertension: Secondary | ICD-10-CM

## 2020-03-16 DIAGNOSIS — E669 Obesity, unspecified: Secondary | ICD-10-CM | POA: Diagnosis not present

## 2020-03-16 DIAGNOSIS — E1169 Type 2 diabetes mellitus with other specified complication: Secondary | ICD-10-CM

## 2020-03-16 DIAGNOSIS — E782 Mixed hyperlipidemia: Secondary | ICD-10-CM

## 2020-03-16 MED ORDER — LISINOPRIL-HYDROCHLOROTHIAZIDE 10-12.5 MG PO TABS: 1.0000 | ORAL_TABLET | Freq: Every day | ORAL | 3 refills | Status: DC

## 2020-03-16 MED ORDER — LANTUS SOLOSTAR 100 UNIT/ML ~~LOC~~ SOPN: 50.0000 [IU] | PEN_INJECTOR | Freq: Every day | SUBCUTANEOUS | 3 refills | Status: DC

## 2020-03-16 MED ORDER — METFORMIN HCL 500 MG PO TABS: ORAL_TABLET | ORAL | 3 refills | Status: DC

## 2020-03-16 NOTE — Patient Instructions (Signed)

## 2020-03-16 NOTE — Progress Notes (Signed)
03/16/2020    Endocrinology Follow Up Visit  Subjective:    Patient ID: John [name], male    DOB: 1961-03-31. Patient is being engaged in follow-up for management of chronically uncontrolled type 2 diabetes, hyperlipidemia, hypertension. PMD:   Kerri Perches, MD  Past Medical History:  Diagnosis Date  . Anxiety   . Depression 2007  . Diabetes mellitus without complication (HCC)   . Helicobacter pylori ab+ 01/21/2019   Dx in 12/2018, will treat  . Hyperlipidemia   . Hypertension   . Hypogonadism male   . Obesity (BMI 30.0-34.9) 03/01/2017   Past Surgical History:  Procedure Laterality Date  . CIRCUMCISION N/A 06/10/2019   Procedure: CIRCUMCISION ADULT;  Surgeon: Malen Gauze, MD;  Location: [facility];  Service: Urology;  Laterality: N/A;  30 MINS  . COLONOSCOPY  01/20/2011   Procedure: COLONOSCOPY;  Surgeon: Arlyce Harman, MD;  Location: [facility];  Service: Endoscopy;  Laterality: N/A;  . HERNIA REPAIR     ventral hernia repair   . VASECTOMY     Social History   Socioeconomic History  . Marital status: Married    Spouse name: Not on file  . Number of children: 1  . Years of education: Not on file  . Highest education level: Not on file  Occupational History  . Occupation: Dispensing optician  Tobacco Use  . Smoking status: Never Smoker  . Smokeless tobacco: Never Used  Vaping Use  . Vaping Use: Never used  Substance and Sexual Activity  . Alcohol use: Yes    Alcohol/week: 1.0 standard drink    Types: 1 Cans of beer per week    Comment: occasional beer socially.   . Drug use: No  . Sexual activity: Yes  Other Topics Concern  . Not on file  Social History Narrative  . Not on file   Social Determinants of Health   Financial Resource Strain:   . Difficulty of Paying Living Expenses: Not on file  Food Insecurity:   . Worried About Programme researcher, broadcasting/film/video in the Last Year: Not on file  . Ran Out of Food in the Last Year: Not on file  Transportation  Needs:   . Lack of Transportation (Medical): Not on file  . Lack of Transportation (Non-Medical): Not on file  Physical Activity:   . Days of Exercise per Week: Not on file  . Minutes of Exercise per Session: Not on file  Stress:   . Feeling of Stress : Not on file  Social Connections:   . Frequency of Communication with Friends and Family: Not on file  . Frequency of Social Gatherings with Friends and Family: Not on file  . Attends Religious Services: Not on file  . Active Member of Clubs or Organizations: Not on file  . Attends Banker Meetings: Not on file  . Marital Status: Not on file   Outpatient Encounter Medications as of 03/16/2020  Medication Sig  . ACCU-CHEK SOFTCLIX LANCETS lancets 100 each by Other route 4 (four) times daily. Use as instructed  . ALPRAZolam (XANAX) 1 MG tablet TAKE 1 TABLET BY MOUTH ONCE DAILY AS NEEDED FOR ANXIETY  . atorvastatin (LIPITOR) 20 MG tablet Take 1 tablet by mouth once daily  . Blood Glucose Monitoring Suppl (ACCU-CHEK AVIVA) device 1 each by Other route 2 (two) times daily. Use as instructed bid. E11.65  . FLUoxetine (PROZAC) 40 MG capsule Take 1 capsule (40 mg total) by mouth daily.  [name]  glucose blood (ACCU-CHEK AVIVA) test strip Use to test blood glucose 4 times a day. E11.65  . insulin glargine (LANTUS SOLOSTAR) 100 UNIT/ML Solostar Pen Inject 50 Units into the skin at bedtime.  . metFORMIN (GLUCOPHAGE) 500 MG tablet TAKE 1 TABLET BY MOUTH TWICE DAILY WITH A MEAL  . traZODone (DESYREL) 150 MG tablet Take 1 tablet (150 mg total) by mouth at bedtime.  . [DISCONTINUED] insulin glargine (LANTUS SOLOSTAR) 100 UNIT/ML Solostar Pen Inject 50 Units into the skin at bedtime.  . [DISCONTINUED] metFORMIN (GLUCOPHAGE) 500 MG tablet TAKE 1 TABLET BY MOUTH TWICE DAILY WITH A MEAL  . lisinopril-hydrochlorothiazide (ZESTORETIC) 10-12.5 MG tablet Take 1 tablet by mouth daily.  . [DISCONTINUED] lisinopril-hydrochlorothiazide (ZESTORETIC) 10-12.5 MG  tablet Take 1 tablet by mouth once daily (Patient not taking: Reported on 03/16/2020)   No facility-administered encounter medications on file as of 03/16/2020.   ALLERGIES: Allergies  Allergen Reactions  . Januvia [Sitagliptin] Nausea And Vomiting  . Poison Oak Extract [Poison Oak Extract] Dermatitis   VACCINATION STATUS: Immunization History  Administered Date(s) Administered  . Influenza Whole 03/18/2010, 04/13/2011  . Influenza,inj,Quad PF,6+ Mos 05/20/2013, 05/08/2014, 06/11/2015, 04/18/2017, 05/23/2018, 03/18/2019, 02/12/2020  . Janssen (J&J) SARS-COV-2 Vaccination 11/02/2019  . Pneumococcal Polysaccharide-23 11/26/2013, 03/18/2019  . Td 09/15/2009    Diabetes He presents for his follow-up diabetic visit. He has type 2 diabetes mellitus. Onset time: He was diagnosed at age 33. His disease course has been worsening. There are no hypoglycemic associated symptoms. Pertinent negatives for hypoglycemia include no headaches, seizures or tremors. Associated symptoms include blurred vision. Pertinent negatives for diabetes include no chest pain, no polydipsia and no polyuria. There are no hypoglycemic complications. Symptoms are worsening. There are no diabetic complications. Risk factors for coronary artery disease include dyslipidemia, diabetes mellitus, hypertension, male sex, obesity and sedentary lifestyle. Current diabetic treatment includes oral agent (monotherapy) and insulin injections. He is compliant with treatment some of the time. His weight is increasing steadily. He is following a generally unhealthy diet. When asked about meal planning, he reported none. He has not had a previous visit with a dietitian. He never participates in exercise. His home blood glucose trend is fluctuating minimally. (Patient presents without meter or logs to review.  He reports he does monitor blood glucose at home, his am check was in the 180s.  His A1C back on 02/12/20 was 6.5%.  He was previously denied  refills on his medications because he needed a follow up appointment.  He denies any episodes of hypoglycemia.  ) An ACE inhibitor/angiotensin II receptor blocker is not being taken (ran out of meds 3 weeks ago). He does not see a podiatrist.Eye exam is current.  Hyperlipidemia This is a chronic problem. The current episode started more than 1 year ago. The problem is controlled. Recent lipid tests were reviewed and are normal. Exacerbating diseases include chronic renal disease, diabetes and obesity. Factors aggravating his hyperlipidemia include fatty foods. Pertinent negatives include no chest pain, myalgias or shortness of breath. Current antihyperlipidemic treatment includes statins. The current treatment provides mild improvement of lipids. Compliance problems include adherence to diet and adherence to exercise.  Risk factors for coronary artery disease include diabetes mellitus, hypertension, male sex, obesity, a sedentary lifestyle and dyslipidemia.  Hypertension This is a chronic problem. The current episode started more than 1 year ago. The problem has been rapidly worsening since onset. The problem is uncontrolled. Associated symptoms include blurred vision. Pertinent negatives include no chest pain, headaches, palpitations or  shortness of breath. There are no associated agents to hypertension. Risk factors for coronary artery disease include dyslipidemia, diabetes mellitus, male gender, obesity and sedentary lifestyle. Past treatments include ACE inhibitors and diuretics. Compliance problems include diet and exercise.  Identifiable causes of hypertension include chronic renal disease.   Review of systems  Constitutional: + Minimally fluctuating body weight,  current Body mass index is 31.26 kg/m. , no fatigue, no subjective hyperthermia, no subjective hypothermia Eyes: no blurry vision, no xerophthalmia ENT: no sore throat, no nodules palpated in throat, no dysphagia/odynophagia, no  hoarseness Cardiovascular: no chest pain, no shortness of breath, no palpitations, no leg swelling Respiratory: no cough, no shortness of breath Gastrointestinal: no nausea/vomiting/diarrhea Musculoskeletal: no muscle/joint aches Skin: no rashes, no hyperemia Neurological: no tremors, no numbness, no tingling, no dizziness Psychiatric: no depression, no anxiety   Objective:    BP (!) 193/95 (BP Location: Left Arm, Patient Position: Sitting)   Pulse 73   Ht 5\' 8"  (1.727 m)   Wt 205 lb 9.6 oz (93.3 kg)   BMI 31.26 kg/m   Wt Readings from Last 3 Encounters:  03/16/20 205 lb 9.6 oz (93.3 kg)  02/12/20 199 lb 0.6 oz (90.3 kg)  08/15/19 193 lb (87.5 kg)    BP Readings from Last 3 Encounters:  03/16/20 (!) 193/95  02/12/20 121/77  08/15/19 120/82     Physical Exam- Limited  Constitutional:  Body mass index is 31.26 kg/m. , not in acute distress, normal state of mind Eyes:  EOMI, no exophthalmos Neck: Supple Thyroid: No gross goiter Cardiovascular: RRR, no murmers, rubs, or gallops, no edema Respiratory: Adequate breathing efforts, no crackles, rales, rhonchi, or wheezing Musculoskeletal: no gross deformities, strength intact in all four extremities, no gross restriction of joint movements Skin:  no rashes, no hyperemia Neurological: no tremor with outstretched hands  CMP     Component Value Date/Time   NA 141 01/07/2020 0729   K 4.1 01/07/2020 0729   CL 103 01/07/2020 0729   CO2 29 01/07/2020 0729   GLUCOSE 98 01/07/2020 0729   BUN 9 01/07/2020 0729   CREATININE 1.38 (H) 01/07/2020 0729   CALCIUM 9.5 01/07/2020 0729   PROT 6.9 01/07/2020 0729   ALBUMIN 4.1 12/30/2016 0950   AST 18 01/07/2020 0729   ALT 21 01/07/2020 0729   ALKPHOS 95 12/30/2016 0950   BILITOT 0.6 01/07/2020 0729   GFRNONAA 56 (L) 01/07/2020 0729   GFRAA 64 01/07/2020 0729    Lab Results  Component Value Date   HGBA1C 6.4 (A) 02/12/2020   HGBA1C 5.6 06/10/2019   HGBA1C 6.5 (H) 02/26/2019     Lipid Panel     Component Value Date/Time   CHOL 163 01/07/2020 0729   TRIG 153 (H) 01/07/2020 0729   HDL 39 (L) 01/07/2020 0729   CHOLHDL 4.2 01/07/2020 0729   VLDL 64 (H) 12/30/2016 0950   LDLCALC 99 01/07/2020 0729     Assessment & Plan:   1. Diabetes mellitus type 2 in obese [facility](HCC)  - Patient has currently uncontrolled symptomatic type 2 DM since  59 years of age.  Patient presents without meter or logs to review.  He reports he does monitor blood glucose at home, his am check was in the 180s.  His A1C back on 02/12/20 was 6.5%.  He was previously denied refills on his medications because he needed a follow up appointment.  He denies any episodes of hypoglycemia.     His diabetes is complicated by  obesity , prior history of noncompliance/nonadherence, and patient remains at a high risk for more acute and chronic complications of diabetes which include CAD, CVA, CKD, retinopathy, and neuropathy. These are all discussed in detail with the patient.  - I have counseled the patient on diet management and weight loss, by adopting a carbohydrate restricted/protein rich diet.  - The patient admits there is a room for improvement in their diet and drink choices. -  Suggestion is made for the patient to avoid simple carbohydrates from their diet including Cakes, Sweet Desserts / Pastries, Ice Cream, Soda (diet and regular), Sweet Tea, Candies, Chips, Cookies, Sweet Pastries,  Store Bought Juices, Alcohol in Excess of  1-2 drinks a day, Artificial Sweeteners, Coffee Creamer, and "Sugar-free" Products. This will help patient to have stable blood glucose profile and potentially avoid unintended weight gain.   - I encouraged the patient to switch to  unprocessed or minimally processed complex starch and increased protein intake (animal or plant source), fruits, and vegetables.   - Patient is advised to stick to a routine mealtimes to eat 3 meals  a day and avoid unnecessary snacks ( to snack  only to correct hypoglycemia).  - I have approached patient with the following individualized plan to manage diabetes and patient agrees:   -Given his lack of meter or logs to review, no changes will be made to his medication regimen today.  He is advised to continue Lantus 50 units SQ nightly and Metformin 500 mg po twice daily with meals.  He was provided with 2 boxes of sample Tresiba pens from the office to use until he picks up his prescription (we do not have samples of Lantus).  -He is urged to monitor blood glucose at least twice daily, before breakfast and before bed and notify the clinic if readings are less than 70 or greater than 300 for 3 tests in a row.  I will have him return in 2 weeks to prove engagement in treatment plan and make necessary adjustments to medications.  - Patient will be considered for incretin therapy as appropriate next visit.  2) BP/HTN: His blood pressure is not controlled to target.  He has been out of his medication for approximately 3 weeks now.  He reports associated dizziness, headache, and blurred vision.  I have refilled his medication and encourage him to consistently take his medication every morning.  He is advised to continue Lisinopril HCT 10-12.5 mg po daily.  3) Lipids/HPL: His most recent lipid panel from 01/07/20 shows controlled LDL at 99 and triglycerides of 153.  He is advised to continue Lipitor 20 mg po daily at bedtime.  Side effects and precautions discussed with him.  4) Chronic Care/Health Maintenance: -Patient is on ACEI/ARB and Statin medications and encouraged to continue to follow up with Ophthalmology, Podiatrist at least yearly or according to recommendations, and advised to   stay away from smoking. I have recommended yearly flu vaccine and pneumonia vaccination at least every 5 years; moderate intensity exercise for up to 150 minutes weekly; and  sleep for at least 7 hours a day.  - I advised patient to maintain close follow up  with Kerri Perches, MD for primary care needs.  - Time spent on this patient care encounter:  35 min, of which > 50% was spent in  counseling and the rest reviewing his blood glucose logs , discussing his hypoglycemia and hyperglycemia episodes, reviewing his current and  previous labs / studies  (  including abstraction from other facilities) and medications  doses and developing a  long term treatment plan and documenting his care.   Please refer to Patient Instructions for Blood Glucose Monitoring and Insulin/Medications Dosing Guide"  in media tab for additional information. Please  also refer to " Patient Self Inventory" in the Media  tab for reviewed elements of pertinent patient history.  [name] participated in the discussions, expressed understanding, and voiced agreement with the above plans.  All questions were answered to his satisfaction. he is encouraged to contact clinic should he have any questions or concerns prior to his return visit.  Follow up plan: - Return in about 2 weeks (around 03/30/2020) for Diabetes follow up with logs and meter.  Ronny Bacon, [facility] [address] [place], [place] [address] Phone: [phone] Fax: [phone]  03/16/2020, 1:30 PM

## 2020-03-19 ENCOUNTER — Other Ambulatory Visit: Payer: Self-pay

## 2020-03-19 ENCOUNTER — Encounter (HOSPITAL_COMMUNITY): Payer: Self-pay | Admitting: Psychiatry

## 2020-03-19 ENCOUNTER — Telehealth (INDEPENDENT_AMBULATORY_CARE_PROVIDER_SITE_OTHER): Payer: Medicare Other | Admitting: Psychiatry

## 2020-03-19 DIAGNOSIS — F418 Other specified anxiety disorders: Secondary | ICD-10-CM | POA: Diagnosis not present

## 2020-03-19 MED ORDER — TRAZODONE HCL 150 MG PO TABS
150.0000 mg | ORAL_TABLET | Freq: Every day | ORAL | 2 refills | Status: DC
Start: 2020-03-19 — End: 2020-06-18

## 2020-03-19 MED ORDER — FLUOXETINE HCL 40 MG PO CAPS: 40.0000 mg | ORAL_CAPSULE | Freq: Every day | ORAL | 2 refills | Status: DC

## 2020-03-19 MED ORDER — ALPRAZOLAM 1 MG PO TABS
1.0000 mg | ORAL_TABLET | Freq: Two times a day (BID) | ORAL | 2 refills | Status: DC
Start: 2020-03-19 — End: 2020-06-15

## 2020-03-19 NOTE — Progress Notes (Signed)
Virtual Visit via Telephone Note  I connected with John [name] on 03/19/20 at 10:00 AM EDT by telephone and verified that I am speaking with the correct person using two identifiers.   I discussed the limitations, risks, security and privacy concerns of performing an evaluation and management service by telephone and the availability of in person appointments. I also discussed with the patient that there may be a patient responsible charge related to this service. The patient expressed understanding and agreed to proceed.     I discussed the assessment and treatment plan with the patient. The patient was provided an opportunity to ask questions and all were answered. The patient agreed with the plan and demonstrated an understanding of the instructions.   The patient was advised to call back or seek an in-person evaluation if the symptoms worsen or if the condition fails to improve as anticipated.  I provided 15 minutes of non-face-to-face time during this encounter. Location: Provider office, patient home  Diannia Ruder, MD  [facility] MD/PA/NP OP Progress Note  03/19/2020 10:13 AM DOMINIQ FONTAINE  MRN:  [id number]  Chief Complaint:  Chief Complaint    Depression; Anxiety; Follow-up     HPI: This patient is a 59 year old married black male who lives with his wife in [place].  He had 1 son who died at age 4 in 56 in a motor vehicle accident.  He is on disability.  The patient returns for follow-up after 3 months.  He still trying to get help for his wife who had a psychotic episode last summer.  Today they are going to [facility] and went [place] to try to get her in with a therapist.  He states that he has to spend a lot of time caring for her.  However he himself is doing well by his report.  He states that his mood is good he denies suicidal ideation.  He has been under more stress and is usually taking the Xanax more than once a day so we will increase the quantity.  He is sleeping well  with the trazodone. Visit Diagnosis:    ICD-10-CM   1. Depression with anxiety  F41.8 FLUoxetine (PROZAC) 40 MG capsule    Past Psychiatric History: none  Past Medical History:  Past Medical History:  Diagnosis Date  . Anxiety   . Depression 2007  . Diabetes mellitus without complication (HCC)   . Helicobacter pylori ab+ 01/21/2019   Dx in 12/2018, will treat  . Hyperlipidemia   . Hypertension   . Hypogonadism male   . Obesity (BMI 30.0-34.9) 03/01/2017    Past Surgical History:  Procedure Laterality Date  . CIRCUMCISION N/A 06/10/2019   Procedure: CIRCUMCISION ADULT;  Surgeon: Malen Gauze, MD;  Location: [facility];  Service: Urology;  Laterality: N/A;  30 MINS  . COLONOSCOPY  01/20/2011   Procedure: COLONOSCOPY;  Surgeon: Arlyce Harman, MD;  Location[facility];  Service: Endoscopy;  Laterality: N/A;  . HERNIA REPAIR     ventral hernia repair   . VASECTOMY      Family Psychiatric History: see below  Family History:  Family History  Problem Relation Age of Onset  . Heart failure Mother        living   . Hypertension Mother   . Hyperlipidemia Mother   . Heart disease Mother   . Hypertension Sister   . Hypertension Brother   . Colon cancer Neg Hx     Social History:  Social  History   Socioeconomic History  . Marital status: Married    Spouse name: Not on file  . Number of children: 1  . Years of education: Not on file  . Highest education level: Not on file  Occupational History  . Occupation: Dispensing optician  Tobacco Use  . Smoking status: Never Smoker  . Smokeless tobacco: Never Used  Vaping Use  . Vaping Use: Never used  Substance and Sexual Activity  . Alcohol use: Yes    Alcohol/week: 1.0 standard drink    Types: 1 Cans of beer per week    Comment: occasional beer socially.   . Drug use: No  . Sexual activity: Yes  Other Topics Concern  . Not on file  Social History Narrative  . Not on file   Social Determinants of Health   Financial  Resource Strain:   . Difficulty of Paying Living Expenses: Not on file  Food Insecurity:   . Worried About Programme researcher, broadcasting/film/video in the Last Year: Not on file  . Ran Out of Food in the Last Year: Not on file  Transportation Needs:   . Lack of Transportation (Medical): Not on file  . Lack of Transportation (Non-Medical): Not on file  Physical Activity:   . Days of Exercise per Week: Not on file  . Minutes of Exercise per Session: Not on file  Stress:   . Feeling of Stress : Not on file  Social Connections:   . Frequency of Communication with Friends and Family: Not on file  . Frequency of Social Gatherings with Friends and Family: Not on file  . Attends Religious Services: Not on file  . Active Member of Clubs or Organizations: Not on file  . Attends Banker Meetings: Not on file  . Marital Status: Not on file    Allergies:  Allergies  Allergen Reactions  . Januvia [Sitagliptin] Nausea And Vomiting  . Poison Oak Extract [Poison Oak Extract] Dermatitis    Metabolic Disorder Labs: Lab Results  Component Value Date   HGBA1C 6.4 (A) 02/12/2020   MPG 114.02 06/10/2019   MPG 140 02/26/2019   No results found for: PROLACTIN Lab Results  Component Value Date   CHOL 163 01/07/2020   TRIG 153 (H) 01/07/2020   HDL 39 (L) 01/07/2020   CHOLHDL 4.2 01/07/2020   VLDL 64 (H) 12/30/2016   LDLCALC 99 01/07/2020   LDLCALC 82 03/18/2019   Lab Results  Component Value Date   TSH 2.84 03/18/2019   TSH 2.35 01/26/2016    Therapeutic Level Labs: No results found for: LITHIUM No results found for: VALPROATE No components found for:  CBMZ  Current Medications: Current Outpatient Medications  Medication Sig Dispense Refill  . ACCU-CHEK SOFTCLIX LANCETS lancets 100 each by Other route 4 (four) times daily. Use as instructed 100 each 5  . ALPRAZolam (XANAX) 1 MG tablet Take 1 tablet (1 mg total) by mouth 2 (two) times daily. 60 tablet 2  . atorvastatin (LIPITOR) 20 MG  tablet Take 1 tablet by mouth once daily 90 tablet 1  . Blood Glucose Monitoring Suppl (ACCU-CHEK AVIVA) device 1 each by Other route 2 (two) times daily. Use as instructed bid. E11.65 1 each 0  . FLUoxetine (PROZAC) 40 MG capsule Take 1 capsule (40 mg total) by mouth daily. 30 capsule 2  . glucose blood (ACCU-CHEK AVIVA) test strip Use to test blood glucose 4 times a day. E11.65 150 each 5  . insulin glargine (LANTUS  SOLOSTAR) 100 UNIT/ML Solostar Pen Inject 50 Units into the skin at bedtime. 30 mL 3  . lisinopril-hydrochlorothiazide (ZESTORETIC) 10-12.5 MG tablet Take 1 tablet by mouth daily. 90 tablet 3  . metFORMIN (GLUCOPHAGE) 500 MG tablet TAKE 1 TABLET BY MOUTH TWICE DAILY WITH A MEAL 180 tablet 3  . traZODone (DESYREL) 150 MG tablet Take 1 tablet (150 mg total) by mouth at bedtime. 30 tablet 2   No current facility-administered medications for this visit.     Musculoskeletal: Strength & Muscle Tone: within normal limits Gait & Station: normal Patient leans: N/A  Psychiatric Specialty Exam: Review of Systems  Psychiatric/Behavioral: The patient is nervous/anxious.   All other systems reviewed and are negative.   There were no vitals taken for this visit.There is no height or weight on file to calculate BMI.  General Appearance: NA  Eye Contact:  NA  Speech:  Clear and Coherent  Volume:  Normal  Mood:  Anxious and Euthymic  Affect:  NA  Thought Process:  Goal Directed  Orientation:  Full (Time, Place, and Person)  Thought Content: Rumination   Suicidal Thoughts:  No  Homicidal Thoughts:  No  Memory:  Immediate;   Good Recent;   Fair Remote;   Poor  Judgement:  Fair  Insight:  Fair  Psychomotor Activity:  Normal  Concentration:  Concentration: Good and Attention Span: Good  Recall:  Good  Fund of Knowledge: Fair  Language: Good  Akathisia:  No  Handed:  Right  AIMS (if indicated): not done  Assets:  Communication Skills Desire for Improvement Physical  Health Resilience Social Support Talents/Skills  ADL's:  Intact  Cognition: Impaired,  Mild  Sleep:  Good   Screenings: PHQ2-9     Office Visit from 02/12/2020 in [facility] Office Visit from 08/15/2019 in [facility] Clinical Support from 05/28/2019 in [facility] Office Visit from 03/18/2019 in [facility] Office Visit from 01/15/2019 in [facility]  PHQ-2 Total Score 1 3 0 6 6  PHQ-9 Total Score -- 9 -- 23 22       Assessment and Plan: This patient is a 59 year old male with a history depression anxiety and mild cognitive impairment.  He is still having a lot of anxiety in dealing with his wife so we will increase Xanax to 1 mg twice daily as needed.  He will continue Prozac 40 mg daily for depression and trazodone 150 mg at bedtime for sleep.  He will return to see me in 3 months   Diannia Ruder, MD 03/19/2020, 10:13 AM

## 2020-03-31 ENCOUNTER — Encounter: Payer: Self-pay | Admitting: Nurse Practitioner

## 2020-03-31 ENCOUNTER — Other Ambulatory Visit: Payer: Self-pay

## 2020-03-31 ENCOUNTER — Ambulatory Visit (INDEPENDENT_AMBULATORY_CARE_PROVIDER_SITE_OTHER): Payer: Medicare Other | Admitting: Nurse Practitioner

## 2020-03-31 VITALS — BP 145/80 | HR 80 | Wt 199.6 lb

## 2020-03-31 DIAGNOSIS — E782 Mixed hyperlipidemia: Secondary | ICD-10-CM

## 2020-03-31 DIAGNOSIS — I10 Essential (primary) hypertension: Secondary | ICD-10-CM

## 2020-03-31 DIAGNOSIS — E1169 Type 2 diabetes mellitus with other specified complication: Secondary | ICD-10-CM

## 2020-03-31 DIAGNOSIS — E669 Obesity, unspecified: Secondary | ICD-10-CM

## 2020-03-31 MED ORDER — LISINOPRIL-HYDROCHLOROTHIAZIDE 20-12.5 MG PO TABS: 1.0000 | ORAL_TABLET | Freq: Every day | ORAL | 3 refills | Status: DC

## 2020-03-31 NOTE — Patient Instructions (Signed)

## 2020-03-31 NOTE — Progress Notes (Signed)
03/31/2020    Endocrinology Follow Up Visit  Subjective:    Patient ID: John [name], male    DOB: Dec 02, 1960. Patient is being engaged in follow-up for management of chronically uncontrolled type 2 diabetes, hyperlipidemia, hypertension. PMD:   Kerri Perches, MD  Past Medical History:  Diagnosis Date  . Anxiety   . Depression 2007  . Diabetes mellitus without complication (HCC)   . Helicobacter pylori ab+ 01/21/2019   Dx in 12/2018, will treat  . Hyperlipidemia   . Hypertension   . Hypogonadism male   . Obesity (BMI 30.0-34.9) 03/01/2017   Past Surgical History:  Procedure Laterality Date  . CIRCUMCISION N/A 06/10/2019   Procedure: CIRCUMCISION ADULT;  Surgeon: Malen Gauze, MD;  Location: [facility];  Service: Urology;  Laterality: N/A;  30 MINS  . COLONOSCOPY  01/20/2011   Procedure: COLONOSCOPY;  Surgeon: Arlyce Harman, MD;  Location: [facility];  Service: Endoscopy;  Laterality: N/A;  . HERNIA REPAIR     ventral hernia repair   . VASECTOMY     Social History   Socioeconomic History  . Marital status: Married    Spouse name: Not on file  . Number of children: 1  . Years of education: Not on file  . Highest education level: Not on file  Occupational History  . Occupation: Dispensing optician  Tobacco Use  . Smoking status: Never Smoker  . Smokeless tobacco: Never Used  Vaping Use  . Vaping Use: Never used  Substance and Sexual Activity  . Alcohol use: Yes    Alcohol/week: 1.0 standard drink    Types: 1 Cans of beer per week    Comment: occasional beer socially.   . Drug use: No  . Sexual activity: Yes  Other Topics Concern  . Not on file  Social History Narrative  . Not on file   Social Determinants of Health   Financial Resource Strain:   . Difficulty of Paying Living Expenses: Not on file  Food Insecurity:   . Worried About Programme researcher, broadcasting/film/video in the Last Year: Not on file  . Ran Out of Food in the Last Year: Not on file  Transportation  Needs:   . Lack of Transportation (Medical): Not on file  . Lack of Transportation (Non-Medical): Not on file  Physical Activity:   . Days of Exercise per Week: Not on file  . Minutes of Exercise per Session: Not on file  Stress:   . Feeling of Stress : Not on file  Social Connections:   . Frequency of Communication with Friends and Family: Not on file  . Frequency of Social Gatherings with Friends and Family: Not on file  . Attends Religious Services: Not on file  . Active Member of Clubs or Organizations: Not on file  . Attends Banker Meetings: Not on file  . Marital Status: Not on file   Outpatient Encounter Medications as of 03/31/2020  Medication Sig  . ACCU-CHEK SOFTCLIX LANCETS lancets 100 each by Other route 4 (four) times daily. Use as instructed  . ALPRAZolam (XANAX) 1 MG tablet Take 1 tablet (1 mg total) by mouth 2 (two) times daily.  [name] atorvastatin (LIPITOR) 20 MG tablet Take 1 tablet by mouth once daily  . Blood Glucose Monitoring Suppl (ACCU-CHEK AVIVA) device 1 each by Other route 2 (two) times daily. Use as instructed bid. E11.65  . FLUoxetine (PROZAC) 40 MG capsule Take 1 capsule (40 mg total) by mouth daily.  [name]  glucose blood (ACCU-CHEK AVIVA) test strip Use to test blood glucose 4 times a day. E11.65  . insulin glargine (LANTUS SOLOSTAR) 100 UNIT/ML Solostar Pen Inject 50 Units into the skin at bedtime.  . metFORMIN (GLUCOPHAGE) 500 MG tablet TAKE 1 TABLET BY MOUTH TWICE DAILY WITH A MEAL  . traZODone (DESYREL) 150 MG tablet Take 1 tablet (150 mg total) by mouth at bedtime.  . [DISCONTINUED] lisinopril-hydrochlorothiazide (ZESTORETIC) 10-12.5 MG tablet Take 1 tablet by mouth daily.  [name] lisinopril-hydrochlorothiazide (ZESTORETIC) 20-12.5 MG tablet Take 1 tablet by mouth daily.   No facility-administered encounter medications on file as of 03/31/2020.   ALLERGIES: Allergies  Allergen Reactions  . Januvia [Sitagliptin] Nausea And Vomiting  . Poison Oak  Extract [Poison Oak Extract] Dermatitis   VACCINATION STATUS: Immunization History  Administered Date(s) Administered  . Influenza Whole 03/18/2010, 04/13/2011  . Influenza,inj,Quad PF,6+ Mos 05/20/2013, 05/08/2014, 06/11/2015, 04/18/2017, 05/23/2018, 03/18/2019, 02/12/2020  . Janssen (J&J) SARS-COV-2 Vaccination 11/02/2019  . Pneumococcal Polysaccharide-23 11/26/2013, 03/18/2019  . Td 09/15/2009    Diabetes He presents for his follow-up diabetic visit. He has type 2 diabetes mellitus. Onset time: He was diagnosed at age 69. His disease course has been improving. There are no hypoglycemic associated symptoms. Pertinent negatives for hypoglycemia include no headaches, seizures or tremors. Associated symptoms include blurred vision. Pertinent negatives for diabetes include no chest pain, no polydipsia and no polyuria. There are no hypoglycemic complications. Symptoms are improving. There are no diabetic complications. Risk factors for coronary artery disease include dyslipidemia, diabetes mellitus, hypertension, male sex, obesity and sedentary lifestyle. Current diabetic treatment includes oral agent (monotherapy) and insulin injections. He is compliant with treatment most of the time. His weight is increasing steadily. He is following a generally unhealthy diet. When asked about meal planning, he reported none. He has not had a previous visit with a dietitian. He never participates in exercise. His home blood glucose trend is decreasing steadily. (Patient presents today with his logs, no meter, showing continued inconsistent monitoring (he only checked his glucose once a day for 5 out of the 14 days since last visit).  His fasting glucose is improving however.  He is under a great amount of stress at home as the sole caregiver for his wife with medical needs.) An ACE inhibitor/angiotensin II receptor blocker is not being taken (ran out of meds 3 weeks ago). He does not see a podiatrist.Eye exam is  current.  Hyperlipidemia This is a chronic problem. The current episode started more than 1 year ago. The problem is controlled. Recent lipid tests were reviewed and are normal. Exacerbating diseases include chronic renal disease, diabetes and obesity. Factors aggravating his hyperlipidemia include fatty foods. Pertinent negatives include no chest pain, myalgias or shortness of breath. Current antihyperlipidemic treatment includes statins. The current treatment provides mild improvement of lipids. Compliance problems include adherence to diet and adherence to exercise.  Risk factors for coronary artery disease include diabetes mellitus, hypertension, male sex, obesity, a sedentary lifestyle and dyslipidemia.  Hypertension This is a chronic problem. The current episode started more than 1 year ago. The problem has been rapidly worsening since onset. The problem is uncontrolled. Associated symptoms include blurred vision. Pertinent negatives include no chest pain, headaches, palpitations or shortness of breath. There are no associated agents to hypertension. Risk factors for coronary artery disease include dyslipidemia, diabetes mellitus, male gender, obesity and sedentary lifestyle. Past treatments include ACE inhibitors and diuretics. Compliance problems include diet and exercise.  Identifiable causes of hypertension  include chronic renal disease.   Review of systems  Constitutional: + Minimally fluctuating body weight,  current Body mass index is 30.35 kg/m. , no fatigue, no subjective hyperthermia, no subjective hypothermia Eyes: no blurry vision, no xerophthalmia ENT: no sore throat, no nodules palpated in throat, no dysphagia/odynophagia, no hoarseness Cardiovascular: no chest pain, no shortness of breath, no palpitations, no leg swelling Respiratory: no cough, no shortness of breath Gastrointestinal: no nausea/vomiting/diarrhea Musculoskeletal: no muscle/joint aches Skin: no rashes, no  hyperemia Neurological: no tremors, no numbness, no tingling, no dizziness Psychiatric: no depression, no anxiety   Objective:    BP (!) 145/80 (BP Location: Left Arm)   Pulse 80   Wt 199 lb 9.6 oz (90.5 kg)   BMI 30.35 kg/m   Wt Readings from Last 3 Encounters:  03/31/20 199 lb 9.6 oz (90.5 kg)  03/16/20 205 lb 9.6 oz (93.3 kg)  02/12/20 199 lb 0.6 oz (90.3 kg)    BP Readings from Last 3 Encounters:  03/31/20 (!) 145/80  03/16/20 (!) 193/95  02/12/20 121/77    Physical Exam- Limited  Constitutional:  Body mass index is 30.35 kg/m. , not in acute distress, normal state of mind Eyes:  EOMI, no exophthalmos Neck: Supple Thyroid: No gross goiter Cardiovascular: RRR, no murmers, rubs, or gallops, no edema Respiratory: Adequate breathing efforts, no crackles, rales, rhonchi, or wheezing Musculoskeletal: no gross deformities, strength intact in all four extremities, no gross restriction of joint movements Skin:  no rashes, no hyperemia Neurological: no tremor with outstretched hands  POCT ABI Results 03/31/20   Right ABI:  1.20      Left ABI:  1.06  Right leg systolic / diastolic: 174/89 mmHg Left leg systolic / diastolic: 154/88 mmHg  Arm systolic / diastolic: 145/80 mmHG  Detailed report will be scanned into patient chart.   CMP     Component Value Date/Time   NA 141 01/07/2020 0729   K 4.1 01/07/2020 0729   CL 103 01/07/2020 0729   CO2 29 01/07/2020 0729   GLUCOSE 98 01/07/2020 0729   BUN 9 01/07/2020 0729   CREATININE 1.38 (H) 01/07/2020 0729   CALCIUM 9.5 01/07/2020 0729   PROT 6.9 01/07/2020 0729   ALBUMIN 4.1 12/30/2016 0950   AST 18 01/07/2020 0729   ALT 21 01/07/2020 0729   ALKPHOS 95 12/30/2016 0950   BILITOT 0.6 01/07/2020 0729   GFRNONAA 56 (L) 01/07/2020 0729   GFRAA 64 01/07/2020 0729    Lab Results  Component Value Date   HGBA1C 6.4 (A) 02/12/2020   HGBA1C 5.6 06/10/2019   HGBA1C 6.5 (H) 02/26/2019    Lipid Panel     Component  Value Date/Time   CHOL 163 01/07/2020 0729   TRIG 153 (H) 01/07/2020 0729   HDL 39 (L) 01/07/2020 0729   CHOLHDL 4.2 01/07/2020 0729   VLDL 64 (H) 12/30/2016 0950   LDLCALC 99 01/07/2020 0729     Assessment & Plan:   1. Diabetes mellitus type 2 in obese [facility])  - Patient has currently uncontrolled symptomatic type 2 DM since  59 years of age.  Patient presents today with his logs, no meter, showing continued inconsistent monitoring (he only checked his glucose once a day for 5 out of the 14 days since last visit).  His fasting glucose is improving however.  He is under a great amount of stress at home as the sole caregiver for his wife with medical needs.   His diabetes is complicated by  obesity , prior history of noncompliance/nonadherence, and patient remains at a high risk for more acute and chronic complications of diabetes which include CAD, CVA, CKD, retinopathy, and neuropathy. These are all discussed in detail with the patient.  - Nutritional counseling repeated at each appointment due to patients tendency to fall back in to old habits.  - The patient admits there is a room for improvement in their diet and drink choices. -  Suggestion is made for the patient to avoid simple carbohydrates from their diet including Cakes, Sweet Desserts / Pastries, Ice Cream, Soda (diet and regular), Sweet Tea, Candies, Chips, Cookies, Sweet Pastries,  Store Bought Juices, Alcohol in Excess of  1-2 drinks a day, Artificial Sweeteners, Coffee Creamer, and "Sugar-free" Products. This will help patient to have stable blood glucose profile and potentially avoid unintended weight gain.   - I encouraged the patient to switch to  unprocessed or minimally processed complex starch and increased protein intake (animal or plant source), fruits, and vegetables.   - Patient is advised to stick to a routine mealtimes to eat 3 meals  a day and avoid unnecessary snacks ( to snack only to correct hypoglycemia).  - I  have approached patient with the following individualized plan to manage diabetes and patient agrees:   -Given the improvement in his fasting glucose readings, he is advised to continue on Lantus 50 units SQ daily at bedtime (still using sample Tresiba pens from office for now until he gets out of the donut hole), and Metformin 500 mg po twice daily before meals.  -He is strongly encouraged to monitor glucose at least twice daily, before breakfast and before bed, and call the clinic if he has readings less than 70 or greater than 200 for 3 tests in a row.  - Patient will be considered for incretin therapy as appropriate next visit.  2) BP/HTN: His blood pressure is not controlled to target.  He has been taking his medication consistently since last visit.  He will benefit from increase in his Lisinopril HCT dose to 20-12.5 mg po daily.  Rx sent to pharmacy.  3) Lipids/HPL: His most recent lipid panel from 01/07/20 shows controlled LDL at 99 and triglycerides of 153.  He is advised to continue Lipitor 20 mg po daily at bedtime.  Side effects and precautions discussed with him.  4) Chronic Care/Health Maintenance: -Patient is on ACEI/ARB and Statin medications and encouraged to continue to follow up with Ophthalmology, Podiatrist at least yearly or according to recommendations, and advised to   stay away from smoking. I have recommended yearly flu vaccine and pneumonia vaccination at least every 5 years; moderate intensity exercise for up to 150 minutes weekly; and  sleep for at least 7 hours a day.  - I advised patient to maintain close follow up with Kerri PerchesSimpson, Margaret E, MD for primary care needs.  - Time spent on this patient care encounter:  35 min, of which > 50% was spent in  counseling and the rest reviewing his blood glucose logs , discussing his hypoglycemia and hyperglycemia episodes, reviewing his current and  previous labs / studies  ( including abstraction from other facilities) and  medications  doses and developing a  long term treatment plan and documenting his care.   Please refer to Patient Instructions for Blood Glucose Monitoring and Insulin/Medications Dosing Guide"  in media tab for additional information. Please  also refer to " Patient Self Inventory" in the Media  tab for reviewed  elements of pertinent patient history.  [name] participated in the discussions, expressed understanding, and voiced agreement with the above plans.  All questions were answered to his satisfaction. he is encouraged to contact clinic should he have any questions or concerns prior to his return visit.  Follow up plan: - Return in about 3 months (around 07/01/2020) for Diabetes follow up- A1c and urine micro in office, Previsit labs.  Ronny Bacon, [facility] [address] [place], [place] [address] Phone: [phone] Fax: [phone]  03/31/2020, 9:37 AM

## 2020-05-28 ENCOUNTER — Ambulatory Visit (INDEPENDENT_AMBULATORY_CARE_PROVIDER_SITE_OTHER): Payer: Medicare Other | Admitting: Family Medicine

## 2020-05-28 ENCOUNTER — Encounter: Payer: Self-pay | Admitting: Family Medicine

## 2020-05-28 ENCOUNTER — Other Ambulatory Visit: Payer: Self-pay

## 2020-05-28 VITALS — BP 145/80 | Ht 68.0 in | Wt 200.0 lb

## 2020-05-28 DIAGNOSIS — Z0000 Encounter for general adult medical examination without abnormal findings: Secondary | ICD-10-CM

## 2020-05-28 DIAGNOSIS — E1169 Type 2 diabetes mellitus with other specified complication: Secondary | ICD-10-CM

## 2020-05-28 MED ORDER — ACCU-CHEK AVIVA DEVI: 1.0000 | Freq: Two times a day (BID) | 0 refills | Status: DC

## 2020-05-28 NOTE — Progress Notes (Addendum)
Subjective:   John [name] is a 59 y.o. male who presents for Medicare Annual/Subsequent preventive examination.  Participants: Nurse for intake and work up; Patient and Provider for Visit and Wrap up  Method of visit: Telephone  Location of Patient: Home Location of Provider: Office Consent was obtain for visit over the telephone. Services rendered by provider: Visit was performed via telephone  I verified that I am speaking with the correct person using two identifiers.   Review of Systems Cardiac Risk Factors include: diabetes mellitus;obesity (BMI >30kg/m2)     Objective:    Today's Vitals   05/28/20 1010 05/28/20 1011  BP: (!) 145/80   Weight: 200 lb (90.7 kg)   Height: 5\' 8"  (1.727 m)   PainSc: 0-No pain 0-No pain   Body mass index is 30.41 kg/m.  Advanced Directives 05/28/2020 06/11/2019 06/10/2019 12/28/2016 09/18/2016 03/02/2016 01/05/2016  Does Patient Have a Medical Advance Directive? No No No No No No No  Would patient like information on creating a medical advance directive? No - Patient declined No - Patient declined No - Patient declined No - Patient declined No - Patient declined No - patient declined information No - patient declined information  Pre-existing out of facility DNR order (yellow form or pink MOST form) - - - - - - -  Some encounter information is confidential and restricted. Go to Review Flowsheets activity to see all data.    Current Medications (verified) Outpatient Encounter Medications as of 05/28/2020  Medication Sig  . ACCU-CHEK SOFTCLIX LANCETS lancets 100 each by Other route 4 (four) times daily. Use as instructed  . ALPRAZolam (XANAX) 1 MG tablet Take 1 tablet (1 mg total) by mouth 2 (two) times daily.  14/02/2020 atorvastatin (LIPITOR) 20 MG tablet Take 1 tablet by mouth once daily  . Blood Glucose Monitoring Suppl (ACCU-CHEK AVIVA) device 1 each by Other route 2 (two) times daily. Use as instructed bid. E11.65  . FLUoxetine (PROZAC) 40 MG  capsule Take 1 capsule (40 mg total) by mouth daily.  [name] glucose blood (ACCU-CHEK AVIVA) test strip Use to test blood glucose 4 times a day. E11.65  . insulin glargine (LANTUS SOLOSTAR) 100 UNIT/ML Solostar Pen Inject 50 Units into the skin at bedtime.  [name] lisinopril-hydrochlorothiazide (ZESTORETIC) 20-12.5 MG tablet Take 1 tablet by mouth daily.  . metFORMIN (GLUCOPHAGE) 500 MG tablet TAKE 1 TABLET BY MOUTH TWICE DAILY WITH A MEAL  . traZODone (DESYREL) 150 MG tablet Take 1 tablet (150 mg total) by mouth at bedtime.   No facility-administered encounter medications on file as of 05/28/2020.    Allergies (verified) Januvia [sitagliptin] and Poison oak extract [poison oak extract]   History: Past Medical History:  Diagnosis Date  . Anxiety   . Depression 2007  . Diabetes mellitus without complication (HCC)   . Helicobacter pylori ab+ 01/21/2019   Dx in 12/2018, will treat  . Hyperlipidemia   . Hypertension   . Hypogonadism male   . Obesity (BMI 30.0-34.9) 03/01/2017   Past Surgical History:  Procedure Laterality Date  . CIRCUMCISION N/A 06/10/2019   Procedure: CIRCUMCISION ADULT;  Surgeon: 06/12/2019, MD;  Location: [facility];  Service: Urology;  Laterality: N/A;  30 MINS  . COLONOSCOPY  01/20/2011   Procedure: COLONOSCOPY;  Surgeon: 03/22/2011, MD;  Location: [facility];  Service: Endoscopy;  Laterality: N/A;  . HERNIA REPAIR     ventral hernia repair   . VASECTOMY  Family History  Problem Relation Age of Onset  . Heart failure Mother        living   . Hypertension Mother   . Hyperlipidemia Mother   . Heart disease Mother   . Hypertension Sister   . Hypertension Brother   . Colon cancer Neg Hx    Social History   Socioeconomic History  . Marital status: Married    Spouse name: Not on file  . Number of children: 1  . Years of education: Not on file  . Highest education level: Not on file  Occupational History  . Occupation: Dispensing optician  Tobacco Use  .  Smoking status: Never Smoker  . Smokeless tobacco: Never Used  Vaping Use  . Vaping Use: Never used  Substance and Sexual Activity  . Alcohol use: Yes    Alcohol/week: 1.0 standard drink    Types: 1 Cans of beer per week    Comment: occasional beer socially.   . Drug use: No  . Sexual activity: Yes  Other Topics Concern  . Not on file  Social History Narrative  . Not on file   Social Determinants of Health   Financial Resource Strain: Low Risk   . Difficulty of Paying Living Expenses: Not hard at all  Food Insecurity: No Food Insecurity  . Worried About Programme researcher, broadcasting/film/video in the Last Year: Never true  . Ran Out of Food in the Last Year: Never true  Transportation Needs: No Transportation Needs  . Lack of Transportation (Medical): No  . Lack of Transportation (Non-Medical): No  Physical Activity: Insufficiently Active  . Days of Exercise per Week: 7 days  . Minutes of Exercise per Session: 20 min  Stress: No Stress Concern Present  . Feeling of Stress : Not at all  Social Connections: Moderately Integrated  . Frequency of Communication with Friends and Family: Twice a week  . Frequency of Social Gatherings with Friends and Family: Once a week  . Attends Religious Services: More than 4 times per year  . Active Member of Clubs or Organizations: No  . Attends Banker Meetings: Never  . Marital Status: Married    Tobacco Counseling Counseling given: Yes   Clinical Intake:  Pre-visit preparation completed: Yes  Pain : No/denies pain Pain Score: 0-No pain     BMI - recorded: 30.36 Nutritional Status: BMI > 30  Obese Nutritional Risks: None Diabetes: Yes CBG done?: No Did pt. bring in CBG monitor from home?: No  How often do you need to have someone help you when you read instructions, pamphlets, or other written materials from your doctor or pharmacy?: 1 - Never What is the last grade level you completed in school?: 9  Diabetic? yes  Interpreter  Needed?: No  Information entered by :: Jerilynn Mages, LPN   Activities of Daily Living In your present state of health, do you have any difficulty performing the following activities: 05/28/2020 06/10/2019  Hearing? N -  Vision? N -  Difficulty concentrating or making decisions? Y -  Walking or climbing stairs? N -  Dressing or bathing? N -  Doing errands, shopping? N N  Preparing Food and eating ? N -  Using the Toilet? N -  In the past six months, have you accidently leaked urine? N -  Do you have problems with loss of bowel control? N -  Managing your Medications? N -  Managing your Finances? N -  Housekeeping or managing your Housekeeping?  N -  Some recent data might be hidden    Patient Care Team: Kerri Perches, MD as PCP - General (Family Medicine) West Bali, MD (Inactive) (Gastroenterology)  Indicate any recent Medical Services you may have received from other than [facility] providers in the past year (date may be approximate).     Assessment:   This is a routine wellness examination for John [name].  Hearing/Vision screen No exam data present  Dietary issues and exercise activities discussed: Current Exercise Habits: Home exercise routine, Type of exercise: walking, Time (Minutes): 20, Frequency (Times/Week): 7, Weekly Exercise (Minutes/Week): 140, Exercise limited by: None identified  Goals    . Weight (lb) < 200 lb (90.7 kg)     Wants to lose 10lbs.      Depression Screen PHQ 2/9 Scores 05/28/2020 02/12/2020 02/12/2020 08/15/2019 05/28/2019 03/18/2019 03/18/2019  PHQ - 2 Score 1 1 0 3 0 6 6  PHQ- 9 Score - - - 9 - 23 23    Fall Risk Fall Risk  05/28/2020 02/12/2020 05/28/2019 03/18/2019 01/15/2019  Falls in the past year? 0 0 0 0 0  Number falls in past yr: 0 - 0 0 0  Injury with Fall? 0 - 0 0 0  Risk for fall due to : No Fall Risks - - - -  Follow up Falls evaluation completed - - - -    FALL RISK PREVENTION PERTAINING TO THE HOME:  Any stairs in or around the  home? Yes  If so, are there any without handrails? No  Home free of loose throw rugs in walkways, pet beds, electrical cords, etc? Yes  Adequate lighting in your home to reduce risk of falls? Yes   ASSISTIVE DEVICES UTILIZED TO PREVENT FALLS:  Life alert? No  Use of a cane, walker or w/c? No  Grab bars in the bathroom? Yes  Shower chair or bench in shower? No  Elevated toilet seat or a handicapped toilet? No   TIMED UP AND GO:  Was the test performed? No .  Length of time to ambulate n/a   Cognitive Function:     6CIT Screen 05/28/2020 05/28/2020 05/28/2019  What Year? 0 points 0 points 0 points  What month? 0 points 0 points 0 points  What time? 0 points - 0 points  Count back from 20 0 points - 0 points  Months in reverse - - 4 points  Repeat phrase - - 10 points  Total Score - - 14    Immunizations Immunization History  Administered Date(s) Administered  . Influenza Whole 03/18/2010, 04/13/2011  . Influenza,inj,Quad PF,6+ Mos 05/20/2013, 05/08/2014, 06/11/2015, 04/18/2017, 05/23/2018, 03/18/2019, 02/12/2020  . Janssen (J&J) SARS-COV-2 Vaccination 11/02/2019  . Pneumococcal Polysaccharide-23 11/26/2013, 03/18/2019  . Td 09/15/2009    TDAP status: Up to date  Flu Vaccine status: Up to date  Pneumococcal vaccine status: Up to date  Covid-19 vaccine status: Completed vaccines  Qualifies for Shingles Vaccine? Yes   Zostavax completed No   Shingrix Completed?: No.    Education has been provided regarding the importance of this vaccine. Patient has been advised to call insurance company to determine out of pocket expense if they have not yet received this vaccine. Advised may also receive vaccine at local pharmacy or Health Dept. Verbalized acceptance and understanding.  Screening Tests Health Maintenance  Topic Date Due  . TETANUS/TDAP  09/16/2019  . COVID-19 Vaccine (2 - Booster for [employer] series) 12/28/2019  . FOOT EXAM  03/17/2020  .  OPHTHALMOLOGY EXAM   07/08/2020  . HEMOGLOBIN A1C  08/14/2020  . COLONOSCOPY  01/19/2021  . INFLUENZA VACCINE  Completed  . PNEUMOCOCCAL POLYSACCHARIDE VACCINE AGE 54-64 HIGH RISK  Completed  . Hepatitis C Screening  Completed  . HIV Screening  Completed    Health Maintenance  Health Maintenance Due  Topic Date Due  . TETANUS/TDAP  09/16/2019  . COVID-19 Vaccine (2 - Booster for [employer] series) 12/28/2019  . FOOT EXAM  03/17/2020    Colorectal cancer screening: Type of screening: Colonoscopy. Completed 01/20/11. Repeat every 10 years  Lung Cancer Screening: (Low Dose CT Chest recommended if Age 1-80 years, 30 pack-year currently smoking OR have quit w/in 15years.) does not qualify.   Lung Cancer Screening Referral: n/a  Additional Screening:  Hepatitis C Screening: does not qualify  Vision Screening: Recommended annual ophthalmology exams for early detection of glaucoma and other disorders of the eye. Is the patient up to date with their annual eye exam?  Yes  Who is the provider or what is the name of the office in which the patient attends annual eye exams? [facility] [place] If pt is not established with a provider, would they like to be referred to a provider to establish care? n/a.   Dental Screening: Recommended annual dental exams for proper oral hygiene  Community Resource Referral / Chronic Care Management: CRR required this visit?  No   CCM required this visit?  No      Plan:     1. Encounter for Medicare annual wellness exam   I have personally reviewed and noted the following in the patient's chart:   . Medical and social history . Use of alcohol, tobacco or illicit drugs  . Current medications and supplements . Functional ability and status . Nutritional status . Physical activity . Advanced directives . List of other physicians . Hospitalizations, surgeries, and ER visits in previous 12 months . Vitals . Screenings to include cognitive, depression, and  falls . Referrals and appointments  In addition, I have reviewed and discussed with patient certain preventive protocols, quality metrics, and best practice recommendations. A written personalized care plan for preventive services as well as general preventive health recommendations were provided to patient.    Agreed to above documentation  Freddy FinnerHannah M Mills, NP   05/28/2020   Nurse Notes: AWV conducted by nurse in office by phone. Patient gave consent to telehealth visit via audio. Patient at home at the time of visit. Provider here in the office. Visit took 30 minutes to complete.

## 2020-05-28 NOTE — Patient Instructions (Addendum)
John [name] , Thank you for taking time to come for your Medicare Wellness Visit. I appreciate your ongoing commitment to your health goals. Please review the following plan we discussed and let me know if I can assist you in the future.   Please continue to practice social distancing to keep you, your family, and our community safe.  If you must go out, please wear a Mask and practice good handwashing.  Screening recommendations/referrals: Colonoscopy: 01/19/21 Recommended yearly ophthalmology/optometry visit for glaucoma screening and checkup Recommended yearly dental visit for hygiene and checkup  Vaccinations: Influenza vaccine: Fall 2022 Pneumococcal vaccine: Complete Tdap vaccine: Due Shingles vaccine: Declined  Advanced directives: No  Conditions/risks identified: None  Next appointment: 06/25/20 @ 8:20 am   Preventive Care 40-64 Years, Male Preventive care refers to lifestyle choices and visits with your health care provider that can promote health and wellness. What does preventive care include?  A yearly physical exam. This is also called an annual well check.  Dental exams once or twice a year.  Routine eye exams. Ask your health care provider how often you should have your eyes checked.  Personal lifestyle choices, including:  Daily care of your teeth and gums.  Regular physical activity.  Eating a healthy diet.  Avoiding tobacco and drug use.  Limiting alcohol use.  Practicing safe sex.  Taking low-dose aspirin every day starting at age 48. What happens during an annual well check? The services and screenings done by your health care provider during your annual well check will depend on your age, overall health, lifestyle risk factors, and family history of disease. Counseling  Your health care provider may ask you questions about your:  Alcohol use.  Tobacco use.  Drug use.  Emotional well-being.  Home and relationship well-being.  Sexual  activity.  Eating habits.  Work and work Astronomer. Screening  You may have the following tests or measurements:  Height, weight, and BMI.  Blood pressure.  Lipid and cholesterol levels. These may be checked every 5 years, or more frequently if you are over 73 years old.  Skin check.  Lung cancer screening. You may have this screening every year starting at age 40 if you have a 30-pack-year history of smoking and currently smoke or have quit within the past 15 years.  Fecal occult blood test (FOBT) of the stool. You may have this test every year starting at age 57.  Flexible sigmoidoscopy or colonoscopy. You may have a sigmoidoscopy every 5 years or a colonoscopy every 10 years starting at age 21.  Prostate cancer screening. Recommendations will vary depending on your family history and other risks.  Hepatitis C blood test.  Hepatitis B blood test.  Sexually transmitted disease (STD) testing.  Diabetes screening. This is done by checking your blood sugar (glucose) after you have not eaten for a while (fasting). You may have this done every 1-3 years. Discuss your test results, treatment options, and if necessary, the need for more tests with your health care provider. Vaccines  Your health care provider may recommend certain vaccines, such as:  Influenza vaccine. This is recommended every year.  Tetanus, diphtheria, and acellular pertussis (Tdap, Td) vaccine. You may need a Td booster every 10 years.  Zoster vaccine. You may need this after age 49.  Pneumococcal 13-valent conjugate (PCV13) vaccine. You may need this if you have certain conditions and have not been vaccinated.  Pneumococcal polysaccharide (PPSV23) vaccine. You may need one or two doses if you  smoke cigarettes or if you have certain conditions. Talk to your health care provider about which screenings and vaccines you need and how often you need them. This information is not intended to replace advice  given to you by your health care provider. Make sure you discuss any questions you have with your health care provider. Document Released: 07/03/2015 Document Revised: 02/24/2016 Document Reviewed: 04/07/2015 Elsevier Interactive Patient Education  2017 [place] Prevention in the Home Falls can cause injuries. They can happen to people of all ages. There are many things you can do to make your home safe and to help prevent falls. What can I do on the outside of my home?  Regularly fix the edges of walkways and driveways and fix any cracks.  Remove anything that might make you trip as you walk through a door, such as a raised step or threshold.  Trim any bushes or trees on the path to your home.  Use bright outdoor lighting.  Clear any walking paths of anything that might make someone trip, such as rocks or tools.  Regularly check to see if handrails are loose or broken. Make sure that both sides of any steps have handrails.  Any raised decks and porches should have guardrails on the edges.  Have any leaves, snow, or ice cleared regularly.  Use sand or salt on walking paths during winter.  Clean up any spills in your garage right away. This includes oil or grease spills. What can I do in the bathroom?  Use night lights.  Install grab bars by the toilet and in the tub and shower. Do not use towel bars as grab bars.  Use non-skid mats or decals in the tub or shower.  If you need to sit down in the shower, use a plastic, non-slip stool.  Keep the floor dry. Clean up any water that spills on the floor as soon as it happens.  Remove soap buildup in the tub or shower regularly.  Attach bath mats securely with double-sided non-slip rug tape.  Do not have throw rugs and other things on the floor that can make you trip. What can I do in the bedroom?  Use night lights.  Make sure that you have a light by your bed that is easy to reach.  Do not use any sheets or  blankets that are too big for your bed. They should not hang down onto the floor.  Have a firm chair that has side arms. You can use this for support while you get dressed.  Do not have throw rugs and other things on the floor that can make you trip. What can I do in the kitchen?  Clean up any spills right away.  Avoid walking on wet floors.  Keep items that you use a lot in easy-to-reach places.  If you need to reach something above you, use a strong step stool that has a grab bar.  Keep electrical cords out of the way.  Do not use floor polish or wax that makes floors slippery. If you must use wax, use non-skid floor wax.  Do not have throw rugs and other things on the floor that can make you trip. What can I do with my stairs?  Do not leave any items on the stairs.  Make sure that there are handrails on both sides of the stairs and use them. Fix handrails that are broken or loose. Make sure that handrails are as long as the  stairways.  Check any carpeting to make sure that it is firmly attached to the stairs. Fix any carpet that is loose or worn.  Avoid having throw rugs at the top or bottom of the stairs. If you do have throw rugs, attach them to the floor with carpet tape.  Make sure that you have a light switch at the top of the stairs and the bottom of the stairs. If you do not have them, ask someone to add them for you. What else can I do to help prevent falls?  Wear shoes that:  Do not have high heels.  Have rubber bottoms.  Are comfortable and fit you well.  Are closed at the toe. Do not wear sandals.  If you use a stepladder:  Make sure that it is fully opened. Do not climb a closed stepladder.  Make sure that both sides of the stepladder are locked into place.  Ask someone to hold it for you, if possible.  Clearly mark and make sure that you can see:  Any grab bars or handrails.  First and last steps.  Where the edge of each step is.  Use tools  that help you move around (mobility aids) if they are needed. These include:  Canes.  Walkers.  Scooters.  Crutches.  Turn on the lights when you go into a dark area. Replace any light bulbs as soon as they burn out.  Set up your furniture so you have a clear path. Avoid moving your furniture around.  If any of your floors are uneven, fix them.  If there are any pets around you, be aware of where they are.  Review your medicines with your doctor. Some medicines can make you feel dizzy. This can increase your chance of falling. Ask your doctor what other things that you can do to help prevent falls. This information is not intended to replace advice given to you by your health care provider. Make sure you discuss any questions you have with your health care provider. Document Released: 04/02/2009 Document Revised: 11/12/2015 Document Reviewed: 07/11/2014 Elsevier Interactive Patient Education  2017 [employer].

## 2020-06-01 ENCOUNTER — Other Ambulatory Visit: Payer: Self-pay

## 2020-06-01 DIAGNOSIS — E669 Obesity, unspecified: Secondary | ICD-10-CM

## 2020-06-01 MED ORDER — ONETOUCH VERIO VI STRP: ORAL_STRIP | 12 refills | Status: DC

## 2020-06-08 ENCOUNTER — Other Ambulatory Visit: Payer: Self-pay

## 2020-06-08 DIAGNOSIS — E669 Obesity, unspecified: Secondary | ICD-10-CM

## 2020-06-08 MED ORDER — ONETOUCH VERIO VI STRP: ORAL_STRIP | 12 refills | Status: DC

## 2020-06-09 ENCOUNTER — Other Ambulatory Visit (HOSPITAL_COMMUNITY): Payer: Self-pay | Admitting: Psychiatry

## 2020-06-18 ENCOUNTER — Telehealth (INDEPENDENT_AMBULATORY_CARE_PROVIDER_SITE_OTHER): Payer: Medicare Other | Admitting: Psychiatry

## 2020-06-18 ENCOUNTER — Other Ambulatory Visit: Payer: Self-pay

## 2020-06-18 ENCOUNTER — Encounter (HOSPITAL_COMMUNITY): Payer: Self-pay | Admitting: Psychiatry

## 2020-06-18 DIAGNOSIS — F418 Other specified anxiety disorders: Secondary | ICD-10-CM | POA: Diagnosis not present

## 2020-06-18 MED ORDER — TRAZODONE HCL 150 MG PO TABS
150.0000 mg | ORAL_TABLET | Freq: Every day | ORAL | 2 refills | Status: DC
Start: 2020-06-18 — End: 2020-09-16

## 2020-06-18 MED ORDER — FLUOXETINE HCL 40 MG PO CAPS: 40.0000 mg | ORAL_CAPSULE | Freq: Every day | ORAL | 2 refills | Status: DC

## 2020-06-18 MED ORDER — ALPRAZOLAM 1 MG PO TABS
1.0000 mg | ORAL_TABLET | Freq: Two times a day (BID) | ORAL | 0 refills | Status: DC
Start: 2020-06-18 — End: 2020-07-15

## 2020-06-18 NOTE — Progress Notes (Signed)
Virtual Visit via Telephone Note  I connected with John [name] on 06/18/20 at 10:00 AM EST by telephone and verified that I am speaking with the correct person using two identifiers.  Location: Patient: home Provider:home   I discussed the limitations, risks, security and privacy concerns of performing an evaluation and management service by telephone and the availability of in person appointments. I also discussed with the patient that there may be a patient responsible charge related to this service. The patient expressed understanding and agreed to proceed.     I discussed the assessment and treatment plan with the patient. The patient was provided an opportunity to ask questions and all were answered. The patient agreed with the plan and demonstrated an understanding of the instructions.   The patient was advised to call back or seek an in-person evaluation if the symptoms worsen or if the condition fails to improve as anticipated.  I provided 15 minutes of non-face-to-face time during this encounter.   Diannia Ruder, MD  [facility] MD/PA/NP OP Progress Note  06/18/2020 10:36 AM John [name]  MRN:  [id number]  Chief Complaint:  Chief Complaint    Anxiety; Depression; Follow-up     HPI: This patient is a 59 year old married black male who lives with his wife in [place].  He had 1 son who died at age 3 in 80 in a motor vehicle accident.  He is on disability.  The patient returns for follow-up after 4 weeks.  He states that he continues to do well.  He is still taking care of his wife who has had a psychotic break in the past but he states that she is improving.  His mood is good and he denies significant depression or anxiety.  He is sleeping well.  He denies suicidal ideation. Visit Diagnosis:    ICD-10-CM   1. Depression with anxiety  F41.8 FLUoxetine (PROZAC) 40 MG capsule    Past Psychiatric History: none  Past Medical History:  Past Medical History:  Diagnosis  Date  . Anxiety   . Depression 2007  . Diabetes mellitus without complication (HCC)   . Helicobacter pylori ab+ 01/21/2019   Dx in 12/2018, will treat  . Hyperlipidemia   . Hypertension   . Hypogonadism male   . Obesity (BMI 30.0-34.9) 03/01/2017    Past Surgical History:  Procedure Laterality Date  . CIRCUMCISION N/A 06/10/2019   Procedure: CIRCUMCISION ADULT;  Surgeon: Malen Gauze, MD;  Location: [facility] ORS;  Service: Urology;  Laterality: N/A;  30 MINS  . COLONOSCOPY  01/20/2011   Procedure: COLONOSCOPY;  Surgeon: Arlyce Harman, MD;  Location: [facility] ENDO SUITE;  Service: Endoscopy;  Laterality: N/A;  . HERNIA REPAIR     ventral hernia repair   . VASECTOMY      Family Psychiatric History: none  Family History:  Family History  Problem Relation Age of Onset  . Heart failure Mother        living   . Hypertension Mother   . Hyperlipidemia Mother   . Heart disease Mother   . Hypertension Sister   . Hypertension Brother   . Colon cancer Neg Hx     Social History:  Social History   Socioeconomic History  . Marital status: Married    Spouse name: Not on file  . Number of children: 1  . Years of education: Not on file  . Highest education level: Not on file  Occupational History  . Occupation: Dispensing optician  Tobacco Use  . Smoking status: Never Smoker  . Smokeless tobacco: Never Used  Vaping Use  . Vaping Use: Never used  Substance and Sexual Activity  . Alcohol use: Yes    Alcohol/week: 1.0 standard drink    Types: 1 Cans of beer per week    Comment: occasional beer socially.   . Drug use: No  . Sexual activity: Yes  Other Topics Concern  . Not on file  Social History Narrative  . Not on file   Social Determinants of Health   Financial Resource Strain: Low Risk   . Difficulty of Paying Living Expenses: Not hard at all  Food Insecurity: No Food Insecurity  . Worried About Programme researcher, broadcasting/film/video in the Last Year: Never true  . Ran Out of Food in the Last  Year: Never true  Transportation Needs: No Transportation Needs  . Lack of Transportation (Medical): No  . Lack of Transportation (Non-Medical): No  Physical Activity: Insufficiently Active  . Days of Exercise per Week: 7 days  . Minutes of Exercise per Session: 20 min  Stress: No Stress Concern Present  . Feeling of Stress : Not at all  Social Connections: Moderately Integrated  . Frequency of Communication with Friends and Family: Twice a week  . Frequency of Social Gatherings with Friends and Family: Once a week  . Attends Religious Services: More than 4 times per year  . Active Member of Clubs or Organizations: No  . Attends Banker Meetings: Never  . Marital Status: Married    Allergies:  Allergies  Allergen Reactions  . Januvia [Sitagliptin] Nausea And Vomiting  . Poison Oak Extract [Poison Oak Extract] Dermatitis    Metabolic Disorder Labs: Lab Results  Component Value Date   HGBA1C 6.4 (A) 02/12/2020   MPG 114.02 06/10/2019   MPG 140 02/26/2019   No results found for: PROLACTIN Lab Results  Component Value Date   CHOL 163 01/07/2020   TRIG 153 (H) 01/07/2020   HDL 39 (L) 01/07/2020   CHOLHDL 4.2 01/07/2020   VLDL 64 (H) 12/30/2016   LDLCALC 99 01/07/2020   LDLCALC 82 03/18/2019   Lab Results  Component Value Date   TSH 2.84 03/18/2019   TSH 2.35 01/26/2016    Therapeutic Level Labs: No results found for: LITHIUM No results found for: VALPROATE No components found for:  CBMZ  Current Medications: Current Outpatient Medications  Medication Sig Dispense Refill  . ACCU-CHEK SOFTCLIX LANCETS lancets 100 each by Other route 4 (four) times daily. Use as instructed 100 each 5  . ALPRAZolam (XANAX) 1 MG tablet Take 1 tablet (1 mg total) by mouth 2 (two) times daily. 60 tablet 0  . atorvastatin (LIPITOR) 20 MG tablet Take 1 tablet by mouth once daily 90 tablet 1  . Blood Glucose Monitoring Suppl (ACCU-CHEK AVIVA) device 1 each by Other route 2  (two) times daily. Use as instructed bid. E11.65 1 each 0  . FLUoxetine (PROZAC) 40 MG capsule Take 1 capsule (40 mg total) by mouth daily. 30 capsule 2  . glucose blood (ONETOUCH VERIO) test strip Use as instructed to test blood glucose twice daily. ONE TOUCH VERIO FLEX test strips 100 each 12  . insulin glargine (LANTUS SOLOSTAR) 100 UNIT/ML Solostar Pen Inject 50 Units into the skin at bedtime. 30 mL 3  . lisinopril-hydrochlorothiazide (ZESTORETIC) 20-12.5 MG tablet Take 1 tablet by mouth daily. 90 tablet 3  . metFORMIN (GLUCOPHAGE) 500 MG tablet TAKE 1 TABLET BY  MOUTH TWICE DAILY WITH A MEAL 180 tablet 3  . traZODone (DESYREL) 150 MG tablet Take 1 tablet (150 mg total) by mouth at bedtime. 30 tablet 2   No current facility-administered medications for this visit.     Musculoskeletal: Strength & Muscle Tone: within normal limits Gait & Station: normal Patient leans: N/A  Psychiatric Specialty Exam: Review of Systems  All other systems reviewed and are negative.   There were no vitals taken for this visit.There is no height or weight on file to calculate BMI.  General Appearance: NA  Eye Contact:  NA  Speech:  Clear and Coherent  Volume:  Normal  Mood:  Euthymic  Affect:  NA  Thought Process:  Goal Directed  Orientation:  Full (Time, Place, and Person)  Thought Content: WDL   Suicidal Thoughts:  No  Homicidal Thoughts:  No  Memory:  Immediate;   Fair Recent;   Fair Remote;   Poor  Judgement:  Fair  Insight:  Fair  Psychomotor Activity:  Normal  Concentration:  Concentration: Fair and Attention Span: Fair  Recall:  [employer] of Knowledge: Fair  Language: Good  Akathisia:  No  Handed:  Right  AIMS (if indicated): not done  Assets:  Communication Skills Desire for Improvement Resilience Social Support Talents/Skills  ADL's:  Intact  Cognition: WNL  Sleep:  Good   Screenings: PHQ2-9   Flowsheet Row Clinical Support from 05/28/2020 in [facility]  Office Visit from 02/12/2020 in [facility] Office Visit from 08/15/2019 in [facility] Clinical Support from 05/28/2019 in [facility] Office Visit from 03/18/2019 in [facility]  PHQ-2 Total Score 1 1 3  0 6  PHQ-9 Total Score -- -- 9 -- 23       Assessment and Plan: This patient is a 59 year old male with a history of depression anxiety and my mild cognitive impairment.  He is doing well on his current regimen.  He will continue Prozac 40 mg daily for depression, Xanax 1 mg twice daily as needed for anxiety and trazodone 150 mg at bedtime for sleep.  He will return to see me in 3 months   66, MD 06/18/2020, 10:36 AM

## 2020-06-22 ENCOUNTER — Encounter: Payer: Self-pay | Admitting: Family Medicine

## 2020-06-22 ENCOUNTER — Telehealth (INDEPENDENT_AMBULATORY_CARE_PROVIDER_SITE_OTHER): Payer: Medicare Other | Admitting: Family Medicine

## 2020-06-22 ENCOUNTER — Other Ambulatory Visit: Payer: Self-pay

## 2020-06-22 DIAGNOSIS — E782 Mixed hyperlipidemia: Secondary | ICD-10-CM | POA: Diagnosis not present

## 2020-06-22 DIAGNOSIS — E1169 Type 2 diabetes mellitus with other specified complication: Secondary | ICD-10-CM

## 2020-06-22 DIAGNOSIS — E118 Type 2 diabetes mellitus with unspecified complications: Secondary | ICD-10-CM | POA: Diagnosis not present

## 2020-06-22 DIAGNOSIS — F324 Major depressive disorder, single episode, in partial remission: Secondary | ICD-10-CM | POA: Diagnosis not present

## 2020-06-22 DIAGNOSIS — I10 Essential (primary) hypertension: Secondary | ICD-10-CM | POA: Diagnosis not present

## 2020-06-22 DIAGNOSIS — E669 Obesity, unspecified: Secondary | ICD-10-CM

## 2020-06-22 DIAGNOSIS — Z794 Long term (current) use of insulin: Secondary | ICD-10-CM | POA: Diagnosis not present

## 2020-06-22 MED ORDER — AMLODIPINE BESYLATE 5 MG PO TABS: 5.0000 mg | ORAL_TABLET | Freq: Every day | ORAL | 3 refills | Status: DC

## 2020-06-22 NOTE — Assessment & Plan Note (Signed)
  Patient re-educated about  the importance of commitment to a  minimum of 150 minutes of exercise per week as able.  The importance of healthy food choices with portion control discussed, as well as eating regularly and within a 12 hour window most days. The need to choose "clean , green" food 50 to 75% of the time is discussed, as well as to make water the primary drink and set a goal of 64 ounces water daily.    Weight /BMI 05/28/2020 03/31/2020 03/16/2020  WEIGHT 200 lb 199 lb 9.6 oz 205 lb 9.6 oz  HEIGHT 5\' 8"  - 5\' 8"   BMI 30.41 kg/m2 30.35 kg/m2 31.26 kg/m2  Some encounter information is confidential and restricted. Go to Review Flowsheets activity to see all data.

## 2020-06-22 NOTE — Assessment & Plan Note (Signed)
uncontrolleed , additional medication added DASH diet and commitment to daily physical activity for a minimum of 30 minutes discussed and encouraged, as a part of hypertension management. The importance of attaining a healthy weight is also discussed.  BP/Weight 05/28/2020 03/31/2020 03/16/2020 02/12/2020 08/15/2019 06/26/2019 06/11/2019  Systolic BP 145 145 193 121 120 133 140  Diastolic BP 80 80 95 77 82 83 96  Wt. (Lbs) 200 199.6 205.6 199.04 193 193 190  BMI 30.41 30.35 31.26 30.26 29.35 29.35 28.89  Some encounter information is confidential and restricted. Go to Review Flowsheets activity to see all data.

## 2020-06-22 NOTE — Progress Notes (Signed)
Virtual Visit via Telephone Note  I connected with John [name] on 06/22/20 at  1:40 PM EST by telephone and verified that I am speaking with the correct person using two identifiers.  Location: Patient: home Provider:office Virtual Visit via Telephone Note  I connected with John [name] on 06/22/20 at  1:40 PM EST by telephone and verified that I am speaking with the correct person using two identifiers.  Location: Patient: home Provider: office   I discussed the limitations, risks, security and privacy concerns of performing an evaluation and management service by telephone and the availability of in person appointments. I also discussed with the patient that there may be a patient responsible charge related to this service. The patient expressed understanding and agreed to proceed.   History of Present Illness: F/u chronic problems Denies polyuria, polydipsia, blurred vision , or hypoglycemic episodes. Not taking insulin, has had none for weeks, not testing blood sugar Denies recent fever or chills. Denies sinus pressure, nasal congestion, ear pain or sore throat. Denies chest congestion, productive cough or wheezing. Denies chest pains, palpitations and leg swelling Denies abdominal pain, nausea, vomiting,diarrhea or constipation.   Denies dysuria, frequency, hesitancy or incontinence. Denies joint pain, swelling and limitation in mobility. Denies headaches, seizures, numbness, or tingling. Denies uncontrolled  depression, anxiety or insomnia. Denies skin break down or rash.       Observations/Objective: There were no vitals taken for this visit. Good communication with no confusion and intact memory. Alert and oriented x 3 No signs of respiratory distress during speech    Assessment and Plan:  Essential hypertension uncontrolleed , additional medication added DASH diet and commitment to daily physical activity for a minimum of 30 minutes discussed and  encouraged, as a part of hypertension management. The importance of attaining a healthy weight is also discussed.  BP/Weight 05/28/2020 03/31/2020 03/16/2020 02/12/2020 08/15/2019 06/26/2019 06/11/2019  Systolic BP 145 145 193 121 120 133 140  Diastolic BP 80 80 95 77 82 83 96  Wt. (Lbs) 200 199.6 205.6 199.04 193 193 190  BMI 30.41 30.35 31.26 30.26 29.35 29.35 28.89  Some encounter information is confidential and restricted. Go to Review Flowsheets activity to see all data.       Diabetes mellitus type 2 in obese [facility]) controlled Updated lab needed at/ before next visit. John [name] is reminded of the importance of commitment to daily physical activity for 30 minutes or more, as able and the need to limit carbohydrate intake to 30 to 60 grams per meal to help with blood sugar control.   The need to take medication as prescribed, test blood sugar as directed, and to call between visits if there is a concern that blood sugar is uncontrolled is also discussed.   John [name] is reminded of the importance of daily foot exam, annual eye examination, and good blood sugar, blood pressure and cholesterol control.  Diabetic Labs Latest Ref Rng & Units 02/12/2020 01/07/2020 06/10/2019 03/18/2019 02/26/2019  HbA1c 4.0 - 5.6 % 6.4(A) - 5.6 - 6.5(H)  Microalbumin mg/dL - - - 40.9 -  Micro/Creat Ratio <30 mcg/mg creat - - - 27 -  Chol <200 mg/dL - 811 - 914 -  HDL > OR = 40 mg/dL - 78(G) - 95(A) -  Calc LDL mg/dL (calc) - 99 - 82 -  Triglycerides <150 mg/dL - 213(Y) - 93 -  Creatinine 0.70 - 1.33 mg/dL - 8.65(H) 8.46 - 9.62   BP/Weight 05/28/2020 03/31/2020 03/16/2020 02/12/2020  08/15/2019 06/26/2019 06/11/2019  Systolic BP 145 145 193 121 120 133 140  Diastolic BP 80 80 95 77 82 83 96  Wt. (Lbs) 200 199.6 205.6 199.04 193 193 190  BMI 30.41 30.35 31.26 30.26 29.35 29.35 28.89  Some encounter information is confidential and restricted. Go to Review Flowsheets activity to see all data.   Foot/eye exam  completion dates Latest Ref Rng & Units 07/09/2019 03/18/2019  Eye Exam No Retinopathy No Retinopathy -  Foot Form Completion - - Done        Depression, major, single episode, in partial remission (HCC) Controlled, no change in medication Managed by Psych, no med change  Mixed hyperlipidemia Hyperlipidemia:Low fat diet discussed and encouraged.   Lipid Panel  Lab Results  Component Value Date   CHOL 163 01/07/2020   HDL 39 (L) 01/07/2020   LDLCALC 99 01/07/2020   TRIG 153 (H) 01/07/2020   CHOLHDL 4.2 01/07/2020     Updated lab needed at/ before next visit.  not at goal  Obesity (BMI 30.0-34.9)  Patient re-educated about  the importance of commitment to a  minimum of 150 minutes of exercise per week as able.  The importance of healthy food choices with portion control discussed, as well as eating regularly and within a 12 hour window most days. The need to choose "clean , green" food 50 to 75% of the time is discussed, as well as to make water the primary drink and set a goal of 64 ounces water daily.    Weight /BMI 05/28/2020 03/31/2020 03/16/2020  WEIGHT 200 lb 199 lb 9.6 oz 205 lb 9.6 oz  HEIGHT 5\' 8"  - 5\' 8"   BMI 30.41 kg/m2 30.35 kg/m2 31.26 kg/m2  Some encounter information is confidential and restricted. Go to Review Flowsheets activity to see all data.       Follow Up Instructions:    I discussed the assessment and treatment plan with the patient. The patient was provided an opportunity to ask questions and all were answered. The patient agreed with the plan and demonstrated an understanding of the instructions.   The patient was advised to call back or seek an in-person evaluation if the symptoms worsen or if the condition fails to improve as anticipated.  I provided 20  minutes of non-face-to-face time during this encounter.   , MD     I discussed the limitations, risks, security and privacy concerns of performing an evaluation and  management service by telephone and the availability of in person appointments. I also discussed with the patient that there may be a patient responsible charge related to this service. The patient expressed understanding and agreed to proceed.   History of Present Illness:    Observations/Objective:   Assessment and Plan:   Follow Up Instructions:    I discussed the assessment and treatment plan with the patient. The patient was provided an opportunity to ask questions and all were answered. The patient agreed with the plan and demonstrated an understanding of the instructions.   The patient was advised to call back or seek an in-person evaluation if the symptoms worsen or if the condition fails to improve as anticipated.  I provided 20 minutes of non-face-to-face time during this encounter.   , MD

## 2020-06-22 NOTE — Assessment & Plan Note (Signed)
Hyperlipidemia:Low fat diet discussed and encouraged.   Lipid Panel  Lab Results  Component Value Date   CHOL 163 01/07/2020   HDL 39 (L) 01/07/2020   LDLCALC 99 01/07/2020   TRIG 153 (H) 01/07/2020   CHOLHDL 4.2 01/07/2020     Updated lab needed at/ before next visit.  not at goal

## 2020-06-22 NOTE — Assessment & Plan Note (Signed)
Controlled, no change in medication Managed by Psych, no med change

## 2020-06-22 NOTE — Assessment & Plan Note (Addendum)
controlled Updated lab needed at/ before next visit. Mr. Kitt is reminded of the importance of commitment to daily physical activity for 30 minutes or more, as able and the need to limit carbohydrate intake to 30 to 60 grams per meal to help with blood sugar control.   The need to take medication as prescribed, test blood sugar as directed, and to call between visits if there is a concern that blood sugar is uncontrolled is also discussed.   Mr. Cona is reminded of the importance of daily foot exam, annual eye examination, and good blood sugar, blood pressure and cholesterol control.  Diabetic Labs Latest Ref Rng & Units 02/12/2020 01/07/2020 06/10/2019 03/18/2019 02/26/2019  HbA1c 4.0 - 5.6 % 6.4(A) - 5.6 - 6.5(H)  Microalbumin mg/dL - - - 78.9 -  Micro/Creat Ratio <30 mcg/mg creat - - - 27 -  Chol <200 mg/dL - 381 - 017 -  HDL > OR = 40 mg/dL - 51(W) - 25(E) -  Calc LDL mg/dL (calc) - 99 - 82 -  Triglycerides <150 mg/dL - 527(P) - 93 -  Creatinine 0.70 - 1.33 mg/dL - 8.24(M) 3.53 - 6.14   BP/Weight 05/28/2020 03/31/2020 03/16/2020 02/12/2020 08/15/2019 06/26/2019 06/11/2019  Systolic BP 145 145 193 121 120 133 140  Diastolic BP 80 80 95 77 82 83 96  Wt. (Lbs) 200 199.6 205.6 199.04 193 193 190  BMI 30.41 30.35 31.26 30.26 29.35 29.35 28.89  Some encounter information is confidential and restricted. Go to Review Flowsheets activity to see all data.   Foot/eye exam completion dates Latest Ref Rng & Units 07/09/2019 03/18/2019  Eye Exam No Retinopathy No Retinopathy -  Foot Form Completion - - Done

## 2020-06-22 NOTE — Patient Instructions (Addendum)
F/U in office wit MD re evaluate blood pressure and for foot exam in 6 to 8 weeks, call if you need me sooner  Please get fasting labs this week, already ordered   You are referred for diabetic eye exam  New additional medication for blood pressure is amlodipine 5 mg one daily, continue the tablet you are currently taking  Please make Dr Isidoro Donning office aware of the fact that you are unable to get insulin as this is a part of your medication regime, and keep your appointment with him in next 2 weeks  It is important that you exercise regularly at least 30 minutes 5 times a week. If you develop chest pain, have severe difficulty breathing, or feel very tired, stop exercising immediately and seek medical attention  Think about what you will eat, plan ahead. Choose " clean, green, fresh or frozen" over canned, processed or packaged foods which are more sugary, salty and fatty. 70 to 75% of food eaten should be vegetables and fruit. Three meals at set times with snacks allowed between meals, but they must be fruit or vegetables. Aim to eat over a 12 hour period , example 7 am to 7 pm, and STOP after  your last meal of the day. Drink water,generally about 64 ounces per day, no other drink is as healthy. Fruit juice is best enjoyed in a healthy way, by EATING the fruit. Thanks for choosing [facility], we consider it a privelige to serve you.

## 2020-06-25 ENCOUNTER — Ambulatory Visit: Payer: Medicare Other | Admitting: Family Medicine

## 2020-07-01 DIAGNOSIS — E1169 Type 2 diabetes mellitus with other specified complication: Secondary | ICD-10-CM | POA: Diagnosis not present

## 2020-07-01 DIAGNOSIS — I10 Essential (primary) hypertension: Secondary | ICD-10-CM | POA: Diagnosis not present

## 2020-07-01 DIAGNOSIS — E559 Vitamin D deficiency, unspecified: Secondary | ICD-10-CM | POA: Diagnosis not present

## 2020-07-03 ENCOUNTER — Other Ambulatory Visit: Payer: Self-pay

## 2020-07-03 ENCOUNTER — Other Ambulatory Visit: Payer: Self-pay | Admitting: Family Medicine

## 2020-07-03 DIAGNOSIS — Z1211 Encounter for screening for malignant neoplasm of colon: Secondary | ICD-10-CM

## 2020-07-03 DIAGNOSIS — E1169 Type 2 diabetes mellitus with other specified complication: Secondary | ICD-10-CM | POA: Diagnosis not present

## 2020-07-03 LAB — CMP14+EGFR
ALT: 34 IU/L (ref 0–44)
AST: 21 IU/L (ref 0–40)
Albumin/Globulin Ratio: 1.9 (ref 1.2–2.2)
Albumin: 4.3 g/dL (ref 3.8–4.9)
Alkaline Phosphatase: 119 IU/L (ref 44–121)
BUN/Creatinine Ratio: 7 — ABNORMAL LOW (ref 10–24)
BUN: 8 mg/dL (ref 8–27)
Bilirubin Total: 0.4 mg/dL (ref 0.0–1.2)
CO2: 26 mmol/L (ref 20–29)
Calcium: 9.5 mg/dL (ref 8.6–10.2)
Chloride: 101 mmol/L (ref 96–106)
Creatinine, Ser: 1.07 mg/dL (ref 0.76–1.27)
GFR calc Af Amer: 87 mL/min/{1.73_m2} (ref >59)
GFR calc non Af Amer: 75 mL/min/{1.73_m2} (ref >59)
Globulin, Total: 2.3 g/dL (ref 1.5–4.5)
Glucose: 92 mg/dL (ref 65–99)
Potassium: 3.9 mmol/L (ref 3.5–5.2)
Sodium: 142 mmol/L (ref 134–144)
Total Protein: 6.6 g/dL (ref 6.0–8.5)

## 2020-07-03 LAB — MICROALBUMIN, URINE: Microalbumin, Urine: 21.1 ug/mL

## 2020-07-03 LAB — CBC
Hematocrit: 36.5 % — ABNORMAL LOW (ref 37.5–51.0)
Hemoglobin: 11.9 g/dL — ABNORMAL LOW (ref 13.0–17.7)
MCH: 30.7 pg (ref 26.6–33.0)
MCHC: 32.6 g/dL (ref 31.5–35.7)
MCV: 94 fL (ref 79–97)
Platelets: 277 10*3/uL (ref 150–450)
RBC: 3.88 x10E6/uL — ABNORMAL LOW (ref 4.14–5.80)
RDW: 11.7 % (ref 11.6–15.4)
WBC: 6.8 10*3/uL (ref 3.4–10.8)

## 2020-07-03 LAB — TSH: TSH: 2.8 u[IU]/mL (ref 0.450–4.500)

## 2020-07-03 LAB — PSA: Prostate Specific Ag, Serum: 0.8 ng/mL (ref 0.0–4.0)

## 2020-07-03 LAB — VITAMIN D 25 HYDROXY (VIT D DEFICIENCY, FRACTURES): Vit D, 25-Hydroxy: 32.3 ng/mL (ref 30.0–100.0)

## 2020-07-04 LAB — COMPREHENSIVE METABOLIC PANEL
ALT: 41 IU/L (ref 0–44)
AST: 31 IU/L (ref 0–40)
Albumin/Globulin Ratio: 1.5 (ref 1.2–2.2)
Albumin: 4.3 g/dL (ref 3.8–4.9)
Alkaline Phosphatase: 124 IU/L — ABNORMAL HIGH (ref 44–121)
BUN/Creatinine Ratio: 7 — ABNORMAL LOW (ref 10–24)
BUN: 9 mg/dL (ref 8–27)
Bilirubin Total: 0.6 mg/dL (ref 0.0–1.2)
CO2: 24 mmol/L (ref 20–29)
Calcium: 10 mg/dL (ref 8.6–10.2)
Chloride: 101 mmol/L (ref 96–106)
Creatinine, Ser: 1.23 mg/dL (ref 0.76–1.27)
GFR calc Af Amer: 73 mL/min/{1.73_m2} (ref >59)
GFR calc non Af Amer: 63 mL/min/{1.73_m2} (ref >59)
Globulin, Total: 2.9 g/dL (ref 1.5–4.5)
Glucose: 86 mg/dL (ref 65–99)
Potassium: 4.1 mmol/L (ref 3.5–5.2)
Sodium: 142 mmol/L (ref 134–144)
Total Protein: 7.2 g/dL (ref 6.0–8.5)

## 2020-07-04 LAB — T4, FREE: Free T4: 1.14 ng/dL (ref 0.82–1.77)

## 2020-07-04 LAB — TSH: TSH: 1.85 u[IU]/mL (ref 0.450–4.500)

## 2020-07-06 ENCOUNTER — Telehealth (INDEPENDENT_AMBULATORY_CARE_PROVIDER_SITE_OTHER): Payer: Medicare Other | Admitting: Nurse Practitioner

## 2020-07-06 ENCOUNTER — Encounter: Payer: Self-pay | Admitting: Nurse Practitioner

## 2020-07-06 ENCOUNTER — Other Ambulatory Visit: Payer: Self-pay

## 2020-07-06 VITALS — Wt 189.0 lb

## 2020-07-06 DIAGNOSIS — I10 Essential (primary) hypertension: Secondary | ICD-10-CM

## 2020-07-06 DIAGNOSIS — E1169 Type 2 diabetes mellitus with other specified complication: Secondary | ICD-10-CM

## 2020-07-06 DIAGNOSIS — E782 Mixed hyperlipidemia: Secondary | ICD-10-CM | POA: Diagnosis not present

## 2020-07-06 DIAGNOSIS — E669 Obesity, unspecified: Secondary | ICD-10-CM

## 2020-07-06 MED ORDER — LANTUS SOLOSTAR 100 UNIT/ML ~~LOC~~ SOPN: 45.0000 [IU] | PEN_INJECTOR | Freq: Every day | SUBCUTANEOUS | 3 refills | Status: DC

## 2020-07-06 NOTE — Patient Instructions (Signed)

## 2020-07-06 NOTE — Progress Notes (Signed)
07/06/2020    Endocrinology Follow Up Visit   TELEHEALTH VISIT: The patient is being engaged in telehealth visit due to COVID-19.  This type of visit limits physical examination significantly, and thus is not preferable over face-to-face encounters.  I connected with  John [name] on 07/06/20 by a video enabled telemedicine application and verified that I am speaking with the correct person using two identifiers.   I discussed the limitations of evaluation and management by telemedicine. The patient expressed understanding and agreed to proceed.    The participants involved in this visit include: Dani Gobble, NP located at [facility] and John [name]  located at their personal residence listed.    Subjective:    Patient ID: John [name], male    DOB: 02-24-1961. Patient is being engaged in follow-up for management of chronically uncontrolled type 2 diabetes, hyperlipidemia, hypertension. PMD:   Kerri Perches, MD  Past Medical History:  Diagnosis Date  . Anxiety   . Depression 2007  . Diabetes mellitus without complication (HCC)   . Helicobacter pylori ab+ 01/21/2019   Dx in 12/2018, will treat  . Hyperlipidemia   . Hypertension   . Hypogonadism male   . Obesity (BMI 30.0-34.9) 03/01/2017   Past Surgical History:  Procedure Laterality Date  . CIRCUMCISION N/A 06/10/2019   Procedure: CIRCUMCISION ADULT;  Surgeon: Malen Gauze, MD;  Location: [facility];  Service: Urology;  Laterality: N/A;  30 MINS  . COLONOSCOPY  01/20/2011   Procedure: COLONOSCOPY;  Surgeon: Arlyce Harman, MD;  Location: [facility];  Service: Endoscopy;  Laterality: N/A;  . HERNIA REPAIR     ventral hernia repair   . VASECTOMY     Social History   Socioeconomic History  . Marital status: Married    Spouse name: Not on file  . Number of children: 1  . Years of education: Not on file  . Highest education level: Not on file  Occupational History  .  Occupation: Dispensing optician  Tobacco Use  . Smoking status: Never Smoker  . Smokeless tobacco: Never Used  Vaping Use  . Vaping Use: Never used  Substance and Sexual Activity  . Alcohol use: Yes    Alcohol/week: 1.0 standard drink    Types: 1 Cans of beer per week    Comment: occasional beer socially.   . Drug use: No  . Sexual activity: Yes  Other Topics Concern  . Not on file  Social History Narrative  . Not on file   Social Determinants of Health   Financial Resource Strain: Low Risk   . Difficulty of Paying Living Expenses: Not hard at all  Food Insecurity: No Food Insecurity  . Worried About Programme researcher, broadcasting/film/video in the Last Year: Never true  . Ran Out of Food in the Last Year: Never true  Transportation Needs: No Transportation Needs  . Lack of Transportation (Medical): No  . Lack of Transportation (Non-Medical): No  Physical Activity: Insufficiently Active  . Days of Exercise per Week: 7 days  . Minutes of Exercise per Session: 20 min  Stress: No Stress Concern Present  . Feeling of Stress : Not at all  Social Connections: Moderately Integrated  . Frequency of Communication with Friends and Family: Twice a week  . Frequency of Social Gatherings with Friends and Family: Once a week  . Attends Religious Services: More than 4 times per year  . Active Member of Clubs or Organizations: No  .  Attends BankerClub or Organization Meetings: Never  . Marital Status: Married   Outpatient Encounter Medications as of 07/06/2020  Medication Sig  . ACCU-CHEK SOFTCLIX LANCETS lancets 100 each by Other route 4 (four) times daily. Use as instructed  . ALPRAZolam (XANAX) 1 MG tablet Take 1 tablet (1 mg total) by mouth 2 (two) times daily.  [name]. amLODipine (NORVASC) 5 MG tablet Take 1 tablet (5 mg total) by mouth daily.  [name]. atorvastatin (LIPITOR) 20 MG tablet Take 1 tablet by mouth once daily  . Blood Glucose Monitoring Suppl (ACCU-CHEK AVIVA) device 1 each by Other route 2 (two) times daily. Use  as instructed bid. E11.65  . FLUoxetine (PROZAC) 40 MG capsule Take 1 capsule (40 mg total) by mouth daily.  [name]. glucose blood (ONETOUCH VERIO) test strip Use as instructed to test blood glucose twice daily. ONE TOUCH VERIO FLEX test strips  . lisinopril-hydrochlorothiazide (ZESTORETIC) 20-12.5 MG tablet Take 1 tablet by mouth daily.  . metFORMIN (GLUCOPHAGE) 500 MG tablet TAKE 1 TABLET BY MOUTH TWICE DAILY WITH A MEAL  . traZODone (DESYREL) 150 MG tablet Take 1 tablet (150 mg total) by mouth at bedtime.  . [DISCONTINUED] insulin glargine (LANTUS SOLOSTAR) 100 UNIT/ML Solostar Pen Inject 50 Units into the skin at bedtime.  . insulin glargine (LANTUS SOLOSTAR) 100 UNIT/ML Solostar Pen Inject 45 Units into the skin at bedtime.   No facility-administered encounter medications on file as of 07/06/2020.   ALLERGIES: Allergies  Allergen Reactions  . Januvia [Sitagliptin] Nausea And Vomiting  . Poison Oak Extract [Poison Oak Extract] Dermatitis   VACCINATION STATUS: Immunization History  Administered Date(s) Administered  . Influenza Whole 03/18/2010, 04/13/2011  . Influenza,inj,Quad PF,6+ Mos 05/20/2013, 05/08/2014, 06/11/2015, 04/18/2017, 05/23/2018, 03/18/2019, 02/12/2020  . Janssen (J&J) SARS-COV-2 Vaccination 11/02/2019  . Pneumococcal Polysaccharide-23 11/26/2013, 03/18/2019  . Td 09/15/2009    Diabetes He presents for his follow-up diabetic visit. He has type 2 diabetes mellitus. Onset time: He was diagnosed at age 60. His disease course has been stable. Hypoglycemia symptoms include nervousness/anxiousness, sweats and tremors. Pertinent negatives for hypoglycemia include no headaches or seizures. Associated symptoms include blurred vision. Pertinent negatives for diabetes include no chest pain, no polydipsia and no polyuria. There are no hypoglycemic complications. Symptoms are stable. There are no diabetic complications. Risk factors for coronary artery disease include dyslipidemia,  diabetes mellitus, hypertension, male sex, obesity and sedentary lifestyle. Current diabetic treatment includes oral agent (monotherapy) and insulin injections. He is compliant with treatment most of the time. His weight is fluctuating minimally. He is following a generally unhealthy diet. When asked about meal planning, he reported none. He has not had a previous visit with a dietitian. He participates in exercise three times a week. His home blood glucose trend is decreasing steadily. His breakfast blood glucose range is generally 70-90 mg/dl. His overall blood glucose range is 180-200 mg/dl. (Patient presents for his virtual visit with his logs to review.  He reports his morning glucose ranges between 70-120 and his evening glucose is usually in the 180s.  He admits he sometimes forgets to monitor his glucose in the evening due to being the sole caregiver for his wife with medical needs.  He does report some frequent fasting hypoglycemia.  His A1c was not checked prior to this visit.  His last A1 was 6.4%. ) An ACE inhibitor/angiotensin II receptor blocker is being taken. He does not see a podiatrist.Eye exam is current.  Hyperlipidemia This is a chronic problem. The current episode  started more than 1 year ago. The problem is controlled. Recent lipid tests were reviewed and are normal. Exacerbating diseases include chronic renal disease, diabetes and obesity. Factors aggravating his hyperlipidemia include fatty foods. Pertinent negatives include no chest pain, myalgias or shortness of breath. Current antihyperlipidemic treatment includes statins. The current treatment provides mild improvement of lipids. Compliance problems include adherence to diet and adherence to exercise.  Risk factors for coronary artery disease include diabetes mellitus, hypertension, male sex, obesity, a sedentary lifestyle and dyslipidemia.  Hypertension This is a chronic problem. The current episode started more than 1 year ago. The  problem has been rapidly worsening since onset. The problem is uncontrolled. Associated symptoms include blurred vision and sweats. Pertinent negatives include no chest pain, headaches, palpitations or shortness of breath. There are no associated agents to hypertension. Risk factors for coronary artery disease include dyslipidemia, diabetes mellitus, male gender, obesity and sedentary lifestyle. Past treatments include ACE inhibitors and diuretics. Compliance problems include diet and exercise.  Identifiable causes of hypertension include chronic renal disease.   Review of systems  Constitutional: + Minimally fluctuating body weight,  current Body mass index is 28.74 kg/m. , no fatigue, no subjective hyperthermia, no subjective hypothermia Eyes: no blurry vision, no xerophthalmia ENT: no sore throat, no nodules palpated in throat, no dysphagia/odynophagia, no hoarseness Cardiovascular: no chest pain, no shortness of breath, no palpitations, no leg swelling Respiratory: no cough, no shortness of breath Gastrointestinal: no nausea/vomiting/diarrhea Musculoskeletal: no muscle/joint aches Skin: no rashes, no hyperemia Neurological: no tremors, no numbness, no tingling, no dizziness Psychiatric: no depression, no anxiety   Objective:    Wt 189 lb (85.7 kg)   BMI 28.74 kg/m   Wt Readings from Last 3 Encounters:  07/06/20 189 lb (85.7 kg)  05/28/20 200 lb (90.7 kg)  03/31/20 199 lb 9.6 oz (90.5 kg)    BP Readings from Last 3 Encounters:  05/28/20 (!) 145/80  03/31/20 (!) 145/80  03/16/20 (!) 193/95    Physical Exam- Telehealth- significantly limited due to nature of visit  Constitutional: Body mass index is 28.74 kg/m. , not in acute distress, normal state of mind Respiratory: Adequate breathing efforts   CMP     Component Value Date/Time   NA 142 07/03/2020 1038   K 4.1 07/03/2020 1038   CL 101 07/03/2020 1038   CO2 24 07/03/2020 1038   GLUCOSE 86 07/03/2020 1038   GLUCOSE  98 01/07/2020 0729   BUN 9 07/03/2020 1038   CREATININE 1.23 07/03/2020 1038   CREATININE 1.38 (H) 01/07/2020 0729   CALCIUM 10.0 07/03/2020 1038   PROT 7.2 07/03/2020 1038   ALBUMIN 4.3 07/03/2020 1038   AST 31 07/03/2020 1038   ALT 41 07/03/2020 1038   ALKPHOS 124 (H) 07/03/2020 1038   BILITOT 0.6 07/03/2020 1038   GFRNONAA 63 07/03/2020 1038   GFRNONAA 56 (L) 01/07/2020 0729   GFRAA 73 07/03/2020 1038   GFRAA 64 01/07/2020 0729    Lab Results  Component Value Date   HGBA1C 6.4 (A) 02/12/2020   HGBA1C 5.6 06/10/2019   HGBA1C 6.5 (H) 02/26/2019    Lipid Panel     Component Value Date/Time   CHOL 163 01/07/2020 0729   TRIG 153 (H) 01/07/2020 0729   HDL 39 (L) 01/07/2020 0729   CHOLHDL 4.2 01/07/2020 0729   VLDL 64 (H) 12/30/2016 0950   LDLCALC 99 01/07/2020 0729     Assessment & Plan:   1) Diabetes mellitus type 2 in obese (  HCC)  - Patient has currently uncontrolled symptomatic type 2 DM since  60 years of age.  Patient presents for his virtual visit with his logs to review.  He reports his morning glucose ranges between 70-120 and his evening glucose is usually in the 180s.  He admits he sometimes forgets to monitor his glucose in the evening due to being the sole caregiver for his wife with medical needs.  He does report some frequent fasting hypoglycemia.  His A1c was not checked prior to this visit.  His last A1 was 6.4%.    His diabetes is complicated by obesity , prior history of noncompliance/nonadherence, and patient remains at a high risk for more acute and chronic complications of diabetes which include CAD, CVA, CKD, retinopathy, and neuropathy. These are all discussed in detail with the patient.  - Nutritional counseling repeated at each appointment due to patients tendency to fall back in to old habits.  - The patient admits there is a room for improvement in their diet and drink choices. -  Suggestion is made for the patient to avoid simple carbohydrates  from their diet including Cakes, Sweet Desserts / Pastries, Ice Cream, Soda (diet and regular), Sweet Tea, Candies, Chips, Cookies, Sweet Pastries,  Store Bought Juices, Alcohol in Excess of  1-2 drinks a day, Artificial Sweeteners, Coffee Creamer, and "Sugar-free" Products. This will help patient to have stable blood glucose profile and potentially avoid unintended weight gain.   - I encouraged the patient to switch to  unprocessed or minimally processed complex starch and increased protein intake (animal or plant source), fruits, and vegetables.   - Patient is advised to stick to a routine mealtimes to eat 3 meals  a day and avoid unnecessary snacks ( to snack only to correct hypoglycemia).  - I have approached patient with the following individualized plan to manage diabetes and patient agrees:   -Given his tight fasting glucose readings, he will tolerate slight decrease in his Lantus to 45 units SQ daily at bedtime.  He can continue his Metformin 500 mg po twice daily with meals.   -He is strongly encouraged to monitor glucose at least twice daily, before breakfast and before bed, and call the clinic if he has readings less than 70 or greater than 200 for 3 tests in a row.  - Patient will be considered for incretin therapy as appropriate next visit.  2) BP/HTN: His blood pressure is not controlled to target, according to his previous visit readings.  He does not have means to check his BP at home.  He reports he ha been taking his medications consistently.  No changes will be made today. He is advised to continue Amlodipine 5 mg po daily and Lisinopril-HCT 20-12.5 mg po daily.   3) Lipids/HPL: His most recent lipid panel from 01/07/20 shows controlled LDL at 99 and triglycerides of 153.  He is advised to continue Lipitor 20 mg po daily at bedtime.  Side effects and precautions discussed with him.  4) Chronic Care/Health Maintenance: -Patient is on ACEI/ARB and Statin medications and  encouraged to continue to follow up with Ophthalmology, Podiatrist at least yearly or according to recommendations, and advised to stay away from smoking. I have recommended yearly flu vaccine and pneumonia vaccination at least every 5 years; moderate intensity exercise for up to 150 minutes weekly; and  sleep for at least 7 hours a day.  - I advised patient to maintain close follow up with Kerri Perches, MD  for primary care needs.   I spent 30 minutes dedicated to the care of this patient on the date of this encounter to include pre-visit review of records, face-to-face time with the patient, and post visit ordering of  testing.   Please refer to Patient Instructions for Blood Glucose Monitoring and Insulin/Medications Dosing Guide"  in media tab for additional information. Please  also refer to " Patient Self Inventory" in the Media  tab for reviewed elements of pertinent patient history.  John [name] participated in the discussions, expressed understanding, and voiced agreement with the above plans.  All questions were answered to his satisfaction. he is encouraged to contact clinic should he have any questions or concerns prior to his return visit.  Follow up plan: - Return in about 4 months (around 11/03/2020) for Diabetes follow up with A1c in office, No previsit labs, Bring glucometer and logs.  Ronny Bacon, [facility] [address] [place], [place] [address] Phone: [phone] Fax: [phone]  07/06/2020, 9:08 AM

## 2020-07-15 ENCOUNTER — Other Ambulatory Visit (HOSPITAL_COMMUNITY): Payer: Self-pay | Admitting: Psychiatry

## 2020-07-17 LAB — IRON: Iron: 89 ug/dL (ref 38–169)

## 2020-07-17 LAB — FERRITIN: Ferritin: 506 ng/mL — ABNORMAL HIGH (ref 30–400)

## 2020-07-17 LAB — SPECIMEN STATUS REPORT

## 2020-08-04 ENCOUNTER — Ambulatory Visit: Payer: Medicare Other | Admitting: Nurse Practitioner

## 2020-08-11 ENCOUNTER — Ambulatory Visit: Payer: Medicare Other | Admitting: Family Medicine

## 2020-08-17 ENCOUNTER — Other Ambulatory Visit: Payer: Self-pay

## 2020-08-17 ENCOUNTER — Encounter: Payer: Self-pay | Admitting: Gastroenterology

## 2020-08-17 ENCOUNTER — Ambulatory Visit: Payer: Medicare Other | Admitting: Gastroenterology

## 2020-08-17 VITALS — BP 152/85 | HR 86 | Temp 97.0°F | Ht 68.0 in | Wt 201.6 lb

## 2020-08-17 DIAGNOSIS — D649 Anemia, unspecified: Secondary | ICD-10-CM | POA: Insufficient documentation

## 2020-08-17 MED ORDER — CLENPIQ 10-3.5-12 MG-GM -GM/160ML PO SOLN: 1.0000 | Freq: Once | ORAL | 0 refills | Status: AC

## 2020-08-17 NOTE — Progress Notes (Signed)
Primary Care Physician: Kerri Perches, MD  Primary Gastroenterologist:  Formerly Jonette Eva, MD   Chief Complaint  Patient presents with  . Anemia    F/u. Last TCS 2012    HPI: John [name] is a 60 y.o. male here at the request of Dr. Lodema Hong for further evaluation of being anemia and consideration of colonoscopy. Last colonoscopy August 2012, large internal hemorrhoids.  Recent labs with hemoglobin 11.9, hematocrit 36.5, MCV 94.  Hemoglobin had been 13.1 in September 2020.  Iron was normal at 89.  Ferritin 506.  Patient diagnosed with H. pylori via breath test July 2020, status post treatment.  Eradication has not been confirmed.  For the most part does well.  Bowel movements fairly regular.  Only occasional constipation.  No melena or rectal bleeding.  No abdominal pain.  No heartburn, vomiting, unintentional weight loss.   Current Outpatient Medications  Medication Sig Dispense Refill  . ACCU-CHEK SOFTCLIX LANCETS lancets 100 each by Other route 4 (four) times daily. Use as instructed 100 each 5  . ALPRAZolam (XANAX) 1 MG tablet Take 1 tablet by mouth twice daily 60 tablet 2  . amLODipine (NORVASC) 5 MG tablet Take 1 tablet (5 mg total) by mouth daily. 30 tablet 3  . atorvastatin (LIPITOR) 20 MG tablet Take 1 tablet by mouth once daily 90 tablet 1  . Blood Glucose Monitoring Suppl (ACCU-CHEK AVIVA) device 1 each by Other route 2 (two) times daily. Use as instructed bid. E11.65 1 each 0  . FLUoxetine (PROZAC) 40 MG capsule Take 1 capsule (40 mg total) by mouth daily. 30 capsule 2  . glucose blood (ONETOUCH VERIO) test strip Use as instructed to test blood glucose twice daily. ONE TOUCH VERIO FLEX test strips 100 each 12  . insulin glargine (LANTUS SOLOSTAR) 100 UNIT/ML Solostar Pen Inject 45 Units into the skin at bedtime. 30 mL 3  . lisinopril-hydrochlorothiazide (ZESTORETIC) 20-12.5 MG tablet Take 1 tablet by mouth daily. 90 tablet 3  . metFORMIN (GLUCOPHAGE)  500 MG tablet TAKE 1 TABLET BY MOUTH TWICE DAILY WITH A MEAL 180 tablet 3  . traZODone (DESYREL) 150 MG tablet Take 1 tablet (150 mg total) by mouth at bedtime. 30 tablet 2   No current facility-administered medications for this visit.    Allergies as of 08/17/2020 - Review Complete 08/17/2020  Allergen Reaction Noted  . Januvia [sitagliptin] Nausea And Vomiting 01/03/2017  . Poison oak extract [poison oak extract] Dermatitis 01/26/2016   Past Medical History:  Diagnosis Date  . Anxiety   . Depression 2007  . Diabetes mellitus without complication (HCC)   . Helicobacter pylori ab+ 01/21/2019   Dx in 12/2018, will treat  . Hyperlipidemia   . Hypertension   . Hypogonadism male   . Obesity (BMI 30.0-34.9) 03/01/2017   Past Surgical History:  Procedure Laterality Date  . CIRCUMCISION N/A 06/10/2019   Procedure: CIRCUMCISION ADULT;  Surgeon: Malen Gauze, MD;  Location: [facility];  Service: Urology;  Laterality: N/A;  30 MINS  . COLONOSCOPY  01/20/2011   Dr. Darrick Penna: large internal hemorrhoids  . HERNIA REPAIR     ventral hernia repair   . VASECTOMY     Family History  Problem Relation Age of Onset  . Heart failure Mother        living   . Hypertension Mother   . Hyperlipidemia Mother   . Heart disease Mother   . Hypertension Sister   . Hypertension Brother   .  Colon cancer Neg Hx    Social History   Tobacco Use  . Smoking status: Never Smoker  . Smokeless tobacco: Never Used  Vaping Use  . Vaping Use: Never used  Substance Use Topics  . Alcohol use: Yes    Alcohol/week: 1.0 standard drink    Types: 1 Cans of beer per week    Comment: occasional beer socially.   . Drug use: No    ROS:  General: Negative for anorexia, weight loss, fever, chills, fatigue, weakness. ENT: Negative for hoarseness, difficulty swallowing , nasal congestion. CV: Negative for chest pain, angina, palpitations, dyspnea on exertion, peripheral edema.  Respiratory: Negative for dyspnea at  rest, dyspnea on exertion, cough, sputum, wheezing.  GI: See history of present illness. GU:  Negative for dysuria, hematuria, urinary incontinence, urinary frequency, nocturnal urination.  Endo: Negative for unusual weight change.    Physical Examination:   BP (!) 152/85   Pulse 86   Temp (!) 97 F (36.1 C)   Ht 5\' 8"  (1.727 m)   Wt 201 lb 9.6 oz (91.4 kg)   BMI 30.65 kg/m   General: Well-nourished, well-developed in no acute distress.  Eyes: No icterus. Mouth: Oropharyngeal mucosa moist and pink , no lesions erythema or exudate. Lungs: Clear to auscultation bilaterally.  Heart: Regular rate and rhythm, no murmurs rubs or gallops.  Abdomen: Bowel sounds are normal, nontender, nondistended, no hepatosplenomegaly or masses, no abdominal bruits or hernia , no rebound or guarding.  Diastases recti. Extremities: No lower extremity edema. No clubbing or deformities. Neuro: Alert and oriented x 4   Skin: Warm and dry, no jaundice.   Psych: Alert and cooperative, normal mood and affect.  Labs:  Lab Results  Component Value Date   IRON 89 07/01/2020   FERRITIN 506 (H) 07/01/2020     Lab Results  Component Value Date   WBC 6.8 07/01/2020   HGB 11.9 (L) 07/01/2020   HCT 36.5 (L) 07/01/2020   MCV 94 07/01/2020   PLT 277 07/01/2020   Lab Results  Component Value Date   TSH 1.850 07/03/2020   Lab Results  Component Value Date   ALT 41 07/03/2020   AST 31 07/03/2020   ALKPHOS 124 (H) 07/03/2020   BILITOT 0.6 07/03/2020   Lab Results  Component Value Date   CREATININE 1.23 07/03/2020   BUN 9 07/03/2020   NA 142 07/03/2020   K 4.1 07/03/2020   CL 101 07/03/2020   CO2 24 07/03/2020   Lab Results  Component Value Date   HGBA1C 6.4 (A) 02/12/2020        Imaging Studies: No results found.   Assessment:  60 year old male presenting for further evaluation of new normocytic anemia, consideration of colonoscopy.  Last colonoscopy over 01/2011.    Normocytic  anemia: Iron levels normal.  Ferritin slightly elevated.  Etiology may be secondary to anemia of chronic disease.  He denies overt GI bleeding.  Hemoccult status unknown.  Diagnosed with H. pylori in 2020, status post treatment.  Plan:  1. Recommend colonoscopy in the near future with Dr. 2021. ASA II.  I have discussed the risks, alternatives, benefits with regards to but not limited to the risk of reaction to medication, bleeding, infection, perforation and the patient is agreeable to proceed. Written consent to be obtained. 2. We will confirm H. pylori eradication via H. pylori breath test.

## 2020-08-17 NOTE — Patient Instructions (Signed)
1. Please go to [facility] to have your H.pylori breath test. It is on the second floor in the same building as Dr. Lodema Hong. 2. Colonoscopy as scheduled. See separate instructions.

## 2020-09-16 ENCOUNTER — Telehealth (INDEPENDENT_AMBULATORY_CARE_PROVIDER_SITE_OTHER): Payer: Medicare Other | Admitting: Psychiatry

## 2020-09-16 ENCOUNTER — Other Ambulatory Visit: Payer: Self-pay

## 2020-09-16 ENCOUNTER — Encounter (HOSPITAL_COMMUNITY): Payer: Self-pay | Admitting: Psychiatry

## 2020-09-16 DIAGNOSIS — F418 Other specified anxiety disorders: Secondary | ICD-10-CM | POA: Diagnosis not present

## 2020-09-16 DIAGNOSIS — F322 Major depressive disorder, single episode, severe without psychotic features: Secondary | ICD-10-CM

## 2020-09-16 MED ORDER — TRAZODONE HCL 150 MG PO TABS: 150.0000 mg | ORAL_TABLET | Freq: Every day | ORAL | 2 refills | Status: DC

## 2020-09-16 MED ORDER — ALPRAZOLAM 1 MG PO TABS: 1.0000 mg | ORAL_TABLET | Freq: Two times a day (BID) | ORAL | 2 refills | Status: DC

## 2020-09-16 MED ORDER — FLUOXETINE HCL 40 MG PO CAPS: 40.0000 mg | ORAL_CAPSULE | Freq: Every day | ORAL | 2 refills | Status: DC

## 2020-09-16 NOTE — Progress Notes (Signed)
Virtual Visit via Telephone Note  I connected with John [name] on 09/16/20 at  9:00 AM EDT by telephone and verified that I am speaking with the correct person using two identifiers.  Location: Patient: home Provider: home   I discussed the limitations, risks, security and privacy concerns of performing an evaluation and management service by telephone and the availability of in person appointments. I also discussed with the patient that there may be a patient responsible charge related to this service. The patient expressed understanding and agreed to proceed.      I discussed the assessment and treatment plan with the patient. The patient was provided an opportunity to ask questions and all were answered. The patient agreed with the plan and demonstrated an understanding of the instructions.   The patient was advised to call back or seek an in-person evaluation if the symptoms worsen or if the condition fails to improve as anticipated.  I provided 15 minutes of non-face-to-face time during this encounter.   John Ruder, MD  [facility] MD/PA/NP OP Progress Note  09/16/2020 9:22 AM John [name]  MRN:  [id number]  Chief Complaint:  Chief Complaint    Depression; Anxiety; Follow-up     HPI: This patient is a 60 year old married black male who lives with his wife in [place].  He had 1 son who died at age 61 in 50 in a motor vehicle accident.  He is on disability.  The patient returns for follow-up after 3 months.  He states that he continues to do well.  He denies significant depression anxiety difficulties with sleep or panic attacks.  He is still caring for his wife who had some sort of psychotic break last year and is still "not back to normal."  We discussed at length the need to get her into see a psychiatrist.  He seems to be handling this well and is going for daily walks for relaxation.  He denies suicidal ideation Visit Diagnosis:    ICD-10-CM   1. Major depressive  disorder, single episode, severe without psychotic features (HCC)  F32.2   2. Depression with anxiety  F41.8 FLUoxetine (PROZAC) 40 MG capsule    Past Psychiatric History: none  Past Medical History:  Past Medical History:  Diagnosis Date  . Anxiety   . Depression 2007  . Diabetes mellitus without complication (HCC)   . Helicobacter pylori ab+ 01/21/2019   Dx in 12/2018, will treat  . Hyperlipidemia   . Hypertension   . Hypogonadism male   . Obesity (BMI 30.0-34.9) 03/01/2017    Past Surgical History:  Procedure Laterality Date  . CIRCUMCISION N/A 06/10/2019   Procedure: CIRCUMCISION ADULT;  Surgeon: Malen Gauze, MD;  Location: [facility];  Service: Urology;  Laterality: N/A;  30 MINS  . COLONOSCOPY  01/20/2011   Dr. Darrick Penna: large internal hemorrhoids  . HERNIA REPAIR     ventral hernia repair   . VASECTOMY      Family Psychiatric History: see below  Family History:  Family History  Problem Relation Age of Onset  . Heart failure Mother        living   . Hypertension Mother   . Hyperlipidemia Mother   . Heart disease Mother   . Hypertension Sister   . Hypertension Brother   . Colon cancer Neg Hx     Social History:  Social History   Socioeconomic History  . Marital status: Married    Spouse name: Not on file  .  Number of children: 1  . Years of education: Not on file  . Highest education level: Not on file  Occupational History  . Occupation: Dispensing optician  Tobacco Use  . Smoking status: Never Smoker  . Smokeless tobacco: Never Used  Vaping Use  . Vaping Use: Never used  Substance and Sexual Activity  . Alcohol use: Yes    Alcohol/week: 1.0 standard drink    Types: 1 Cans of beer per week    Comment: occasional beer socially.   . Drug use: No  . Sexual activity: Yes  Other Topics Concern  . Not on file  Social History Narrative  . Not on file   Social Determinants of Health   Financial Resource Strain: Low Risk   . Difficulty of Paying Living  Expenses: Not hard at all  Food Insecurity: No Food Insecurity  . Worried About Programme researcher, broadcasting/film/video in the Last Year: Never true  . Ran Out of Food in the Last Year: Never true  Transportation Needs: No Transportation Needs  . Lack of Transportation (Medical): No  . Lack of Transportation (Non-Medical): No  Physical Activity: Insufficiently Active  . Days of Exercise per Week: 7 days  . Minutes of Exercise per Session: 20 min  Stress: No Stress Concern Present  . Feeling of Stress : Not at all  Social Connections: Moderately Integrated  . Frequency of Communication with Friends and Family: Twice a week  . Frequency of Social Gatherings with Friends and Family: Once a week  . Attends Religious Services: More than 4 times per year  . Active Member of Clubs or Organizations: No  . Attends Banker Meetings: Never  . Marital Status: Married    Allergies:  Allergies  Allergen Reactions  . Januvia [Sitagliptin] Nausea And Vomiting  . Poison Oak Extract [Poison Oak Extract] Dermatitis    Metabolic Disorder Labs: Lab Results  Component Value Date   HGBA1C 6.4 (A) 02/12/2020   MPG 114.02 06/10/2019   MPG 140 02/26/2019   No results found for: PROLACTIN Lab Results  Component Value Date   CHOL 163 01/07/2020   TRIG 153 (H) 01/07/2020   HDL 39 (L) 01/07/2020   CHOLHDL 4.2 01/07/2020   VLDL 64 (H) 12/30/2016   LDLCALC 99 01/07/2020   LDLCALC 82 03/18/2019   Lab Results  Component Value Date   TSH 1.850 07/03/2020   TSH 2.800 07/01/2020    Therapeutic Level Labs: No results found for: LITHIUM No results found for: VALPROATE No components found for:  CBMZ  Current Medications: Current Outpatient Medications  Medication Sig Dispense Refill  . ACCU-CHEK SOFTCLIX LANCETS lancets 100 each by Other route 4 (four) times daily. Use as instructed 100 each 5  . ALPRAZolam (XANAX) 1 MG tablet Take 1 tablet (1 mg total) by mouth 2 (two) times daily. 60 tablet 2  .  amLODipine (NORVASC) 5 MG tablet Take 1 tablet (5 mg total) by mouth daily. 30 tablet 3  . atorvastatin (LIPITOR) 20 MG tablet Take 1 tablet by mouth once daily (Patient taking differently: Take 20 mg by mouth daily.) 90 tablet 1  . Blood Glucose Monitoring Suppl (ACCU-CHEK AVIVA) device 1 each by Other route 2 (two) times daily. Use as instructed bid. E11.65 1 each 0  . FLUoxetine (PROZAC) 40 MG capsule Take 1 capsule (40 mg total) by mouth daily. 30 capsule 2  . glucose blood (ONETOUCH VERIO) test strip Use as instructed to test blood glucose twice daily.  ONE TOUCH VERIO FLEX test strips 100 each 12  . insulin glargine (LANTUS SOLOSTAR) 100 UNIT/ML Solostar Pen Inject 45 Units into the skin at bedtime. 30 mL 3  . lisinopril-hydrochlorothiazide (ZESTORETIC) 20-12.5 MG tablet Take 1 tablet by mouth daily. 90 tablet 3  . metFORMIN (GLUCOPHAGE) 500 MG tablet TAKE 1 TABLET BY MOUTH TWICE DAILY WITH A MEAL (Patient taking differently: Take 500 mg by mouth 2 (two) times daily with a meal. TAKE 1 TABLET BY MOUTH TWICE DAILY WITH A MEAL) 180 tablet 3  . traZODone (DESYREL) 150 MG tablet Take 1 tablet (150 mg total) by mouth at bedtime. 30 tablet 2   No current facility-administered medications for this visit.     Musculoskeletal: Strength & Muscle Tone: within normal limits Gait & Station: normal Patient leans: N/A  Psychiatric Specialty Exam: Review of Systems  All other systems reviewed and are negative.   There were no vitals taken for this visit.There is no height or weight on file to calculate BMI.  General Appearance: NA  Eye Contact:  NA  Speech:  Clear and Coherent  Volume:  Normal  Mood:  Euthymic  Affect:  NA  Thought Process:  Goal Directed  Orientation:  Full (Time, Place, and Person)  Thought Content: WDL   Suicidal Thoughts:  No  Homicidal Thoughts:  No  Memory:  Immediate;   Good Recent;   Good Remote;   Fair  Judgement:  Good  Insight:  Fair  Psychomotor Activity:   Normal  Concentration:  Concentration: Good and Attention Span: Good  Recall:  Good  Fund of Knowledge: Fair  Language: Good  Akathisia:  No  Handed:  Right  AIMS (if indicated): not done  Assets:  Communication Skills Desire for Improvement Resilience Social Support Talents/Skills  ADL's:  Intact  Cognition: Impaired,  Mild  Sleep:  Good   Screenings: PHQ2-9   Flowsheet Row [facility] Video Visit from 06/22/2020 in [facility] Clinical Support from 05/28/2020 in [facility] Office Visit from 02/12/2020 in [facility] Office Visit from 08/15/2019 in [facility]  PHQ-2 Total Score 0 0 1 1 3   PHQ-9 Total Score -- -- -- -- 9    Flowsheet Row Video Visit from 09/16/2020 in [facility] Office Visit from 03/18/2019 in [facility]  C-SSRS RISK CATEGORY No Risk Error: Q3, 4, or 5 should not be populated when Q2 is No       Assessment and Plan: This patient is a 60 year old male with a history of depression anxiety mild cognitive impairment.  He continues to do well on his current regimen.  He will continue Prozac 40 mg daily for depression, Xanax 1 mg twice daily as needed for anxiety and trazodone 150 mg at bedtime for sleep.  He will return to see me in 3 months   66, MD 09/16/2020, 9:22 AM

## 2020-09-18 ENCOUNTER — Other Ambulatory Visit (HOSPITAL_COMMUNITY)
Admission: RE | Admit: 2020-09-18 | Discharge: 2020-09-18 | Disposition: A | Discharge status: Home / Self Care | Payer: Medicare Other | Source: Ambulatory Visit | Attending: Internal Medicine | Admitting: Internal Medicine

## 2020-09-18 ENCOUNTER — Other Ambulatory Visit: Payer: Self-pay

## 2020-09-18 DIAGNOSIS — Z20822 Contact with and (suspected) exposure to covid-19: Secondary | ICD-10-CM | POA: Insufficient documentation

## 2020-09-18 DIAGNOSIS — Z01812 Encounter for preprocedural laboratory examination: Secondary | ICD-10-CM | POA: Insufficient documentation

## 2020-09-18 LAB — BASIC METABOLIC PANEL
Anion gap: 9 (ref 5–15)
BUN: 14 mg/dL (ref 6–20)
CO2: 26 mmol/L (ref 22–32)
Calcium: 9.2 mg/dL (ref 8.9–10.3)
Chloride: 97 mmol/L — ABNORMAL LOW (ref 98–111)
Creatinine, Ser: 1.18 mg/dL (ref 0.61–1.24)
GFR, Estimated: >60 mL/min (ref >60)
Glucose, Bld: 292 mg/dL — ABNORMAL HIGH (ref 70–99)
Potassium: 3.8 mmol/L (ref 3.5–5.1)
Sodium: 132 mmol/L — ABNORMAL LOW (ref 135–145)

## 2020-09-19 LAB — SARS CORONAVIRUS 2 (TAT 6-24 HRS): SARS Coronavirus 2: NEGATIVE

## 2020-09-21 ENCOUNTER — Encounter (HOSPITAL_COMMUNITY): Payer: Self-pay

## 2020-09-21 ENCOUNTER — Other Ambulatory Visit: Payer: Self-pay

## 2020-09-21 ENCOUNTER — Ambulatory Visit (HOSPITAL_COMMUNITY)
Admission: RE | Admit: 2020-09-21 | Discharge: 2020-09-21 | Disposition: A | Discharge status: Home / Self Care | Payer: Medicare Other | Attending: Internal Medicine | Admitting: Internal Medicine

## 2020-09-21 ENCOUNTER — Ambulatory Visit (HOSPITAL_COMMUNITY): Payer: Medicare Other | Admitting: Anesthesiology

## 2020-09-21 ENCOUNTER — Encounter (HOSPITAL_COMMUNITY)
Admission: RE | Disposition: A | Discharge status: Home / Self Care | Payer: Self-pay | Source: Home / Self Care | Attending: Internal Medicine

## 2020-09-21 DIAGNOSIS — I10 Essential (primary) hypertension: Secondary | ICD-10-CM | POA: Diagnosis not present

## 2020-09-21 DIAGNOSIS — Z79899 Other long term (current) drug therapy: Secondary | ICD-10-CM | POA: Diagnosis not present

## 2020-09-21 DIAGNOSIS — E119 Type 2 diabetes mellitus without complications: Secondary | ICD-10-CM | POA: Insufficient documentation

## 2020-09-21 DIAGNOSIS — Z888 Allergy status to other drugs, medicaments and biological substances status: Secondary | ICD-10-CM | POA: Diagnosis not present

## 2020-09-21 DIAGNOSIS — Z8349 Family history of other endocrine, nutritional and metabolic diseases: Secondary | ICD-10-CM | POA: Insufficient documentation

## 2020-09-21 DIAGNOSIS — Z794 Long term (current) use of insulin: Secondary | ICD-10-CM | POA: Insufficient documentation

## 2020-09-21 DIAGNOSIS — Z8249 Family history of ischemic heart disease and other diseases of the circulatory system: Secondary | ICD-10-CM | POA: Insufficient documentation

## 2020-09-21 DIAGNOSIS — K648 Other hemorrhoids: Secondary | ICD-10-CM | POA: Diagnosis not present

## 2020-09-21 DIAGNOSIS — Z9109 Other allergy status, other than to drugs and biological substances: Secondary | ICD-10-CM | POA: Diagnosis not present

## 2020-09-21 DIAGNOSIS — D509 Iron deficiency anemia, unspecified: Secondary | ICD-10-CM

## 2020-09-21 HISTORY — PX: COLONOSCOPY WITH PROPOFOL: SHX5780

## 2020-09-21 LAB — GLUCOSE, CAPILLARY: Glucose-Capillary: 272 mg/dL — ABNORMAL HIGH (ref 70–99)

## 2020-09-21 SURGERY — COLONOSCOPY WITH PROPOFOL
Anesthesia: General

## 2020-09-21 MED ORDER — LACTATED RINGERS IV SOLN: INTRAVENOUS | Status: DC

## 2020-09-21 MED ORDER — PROPOFOL 500 MG/50ML IV EMUL
INTRAVENOUS | Status: DC | PRN
  Administered 2020-09-21: 150 ug/kg/min via INTRAVENOUS

## 2020-09-21 MED ORDER — STERILE WATER FOR IRRIGATION IR SOLN
Status: DC | PRN
  Administered 2020-09-21: 100 mL

## 2020-09-21 MED ORDER — PROPOFOL 10 MG/ML IV BOLUS
INTRAVENOUS | Status: AC
  Filled 2020-09-21: qty 40

## 2020-09-21 MED ORDER — CHLORHEXIDINE GLUCONATE CLOTH 2 % EX PADS: 6.0000 | MEDICATED_PAD | Freq: Once | CUTANEOUS | Status: DC

## 2020-09-21 MED ORDER — PROPOFOL 10 MG/ML IV BOLUS
INTRAVENOUS | Status: DC | PRN
  Administered 2020-09-21: 100 mg via INTRAVENOUS

## 2020-09-21 MED ORDER — LIDOCAINE HCL (PF) 2 % IJ SOLN
INTRAMUSCULAR | Status: AC
  Filled 2020-09-21: qty 5

## 2020-09-21 MED ORDER — LIDOCAINE HCL (CARDIAC) PF 100 MG/5ML IV SOSY
PREFILLED_SYRINGE | INTRAVENOUS | Status: DC | PRN
  Administered 2020-09-21: 50 mg via INTRAVENOUS

## 2020-09-21 NOTE — Discharge Instructions (Addendum)
Colonoscopy Discharge Instructions  Read the instructions outlined below and refer to this sheet in the next few weeks. These discharge instructions provide you with general information on caring for yourself after you leave the hospital. Your doctor may also give you specific instructions. While your treatment has been planned according to the most current medical practices available, unavoidable complications occasionally occur.   ACTIVITY  You may resume your regular activity, but move at a slower pace for the next 24 hours.   Take frequent rest periods for the next 24 hours.   Walking will help get rid of the air and reduce the bloated feeling in your belly (abdomen).   No driving for 24 hours (because of the medicine (anesthesia) used during the test).    Do not sign any important legal documents or operate any machinery for 24 hours (because of the anesthesia used during the test).  NUTRITION  Drink plenty of fluids.   You may resume your normal diet as instructed by your doctor.   Begin with a light meal and progress to your normal diet. Heavy or fried foods are harder to digest and may make you feel sick to your stomach (nauseated).   Avoid alcoholic beverages for 24 hours or as instructed.  MEDICATIONS  You may resume your normal medications unless your doctor tells you otherwise.  WHAT YOU CAN EXPECT TODAY  Some feelings of bloating in the abdomen.   Passage of more gas than usual.   Spotting of blood in your stool or on the toilet paper.  IF YOU HAD POLYPS REMOVED DURING THE COLONOSCOPY:  No aspirin products for 7 days or as instructed.   No alcohol for 7 days or as instructed.   Eat a soft diet for the next 24 hours.  FINDING OUT THE RESULTS OF YOUR TEST Not all test results are available during your visit. If your test results are not back during the visit, make an appointment with your caregiver to find out the results. Do not assume everything is normal if  you have not heard from your caregiver or the medical facility. It is important for you to follow up on all of your test results.  SEEK IMMEDIATE MEDICAL ATTENTION IF:  You have more than a spotting of blood in your stool.   Your belly is swollen (abdominal distention).   You are nauseated or vomiting.   You have a temperature over 101.   You have abdominal pain or discomfort that is severe or gets worse throughout the day.   Your colonoscopy was relatively unremarkable besides internal hemorrhoids. I did not find any polyps or evidence of colon cancer. Recommend repeating in 10 years for screening. Follow up with GI in 3 months.   I hope you have a great rest of your week!  Hennie Duos. Marletta Lor, D.O. Gastroenterology and Hepatology [facility]   Hemorrhoids Hemorrhoids are swollen veins that may develop:  In the butt (rectum). These are called internal hemorrhoids.  Around the opening of the butt (anus). These are called external hemorrhoids. Hemorrhoids can cause pain, itching, or bleeding. Most of the time, they do not cause serious problems. They usually get better with diet changes, lifestyle changes, and other home treatments. What are the causes? This condition may be caused by:  Having trouble pooping (constipation).  Pushing hard (straining) to poop.  Watery poop (diarrhea).  Pregnancy.  Being very overweight (obese).  Sitting for long periods of time.  Heavy lifting or other  activity that causes you to strain.  Anal sex.  Riding a bike for a long period of time. What are the signs or symptoms? Symptoms of this condition include:  Pain.  Itching or soreness in the butt.  Bleeding from the butt.  Leaking poop.  Swelling in the area.  One or more lumps around the opening of your butt. How is this diagnosed? A doctor can often diagnose this condition by looking at the affected area. The doctor may also:  Do an exam that  involves feeling the area with a gloved hand (digital rectal exam).  Examine the area inside your butt using a small tube (anoscope).  Order blood tests. This may be done if you have lost a lot of blood.  Have you get a test that involves looking inside the colon using a flexible tube with a camera on the end (sigmoidoscopy or colonoscopy). How is this treated? This condition can usually be treated at home. Your doctor may tell you to change what you eat, make lifestyle changes, or try home treatments. If these do not help, procedures can be done to remove the hemorrhoids or make them smaller. These may involve:  Placing rubber bands at the base of the hemorrhoids to cut off their blood supply.  Injecting medicine into the hemorrhoids to shrink them.  Shining a type of light energy onto the hemorrhoids to cause them to fall off.  Doing surgery to remove the hemorrhoids or cut off their blood supply. Follow these instructions at home: Eating and drinking  Eat foods that have a lot of fiber in them. These include whole grains, beans, nuts, fruits, and vegetables.  Ask your doctor about taking products that have added fiber (fibersupplements).  Reduce the amount of fat in your diet. You can do this by: ? Eating low-fat dairy products. ? Eating less red meat. ? Avoiding processed foods.  Drink enough fluid to keep your pee (urine) pale yellow.   Managing pain and swelling  Take a warm-water bath (sitz bath) for 20 minutes to ease pain. Do this 3-4 times a day. You may do this in a bathtub or using a portable sitz bath that fits over the toilet.  If told, put ice on the painful area. It may be helpful to use ice between your warm baths. ? Put ice in a plastic bag. ? Place a towel between your skin and the bag. ? Leave the ice on for 20 minutes, 2-3 times a day.   General instructions  Take over-the-counter and prescription medicines only as told by your doctor. ? Medicated creams  and medicines may be used as told.  Exercise often. Ask your doctor how much and what kind of exercise is best for you.  Go to the bathroom when you have the urge to poop. Do not wait.  Avoid pushing too hard when you poop.  Keep your butt dry and clean. Use wet toilet paper or moist towelettes after pooping.  Do not sit on the toilet for a long time.  Keep all follow-up visits as told by your doctor. This is important. Contact a doctor if you:  Have pain and swelling that do not get better with treatment or medicine.  Have trouble pooping.  Cannot poop.  Have pain or swelling outside the area of the hemorrhoids. Get help right away if you have:  Bleeding that will not stop. Summary  Hemorrhoids are swollen veins in the butt or around the opening of the  butt.  They can cause pain, itching, or bleeding.  Eat foods that have a lot of fiber in them. These include whole grains, beans, nuts, fruits, and vegetables.  Take a warm-water bath (sitz bath) for 20 minutes to ease pain. Do this 3-4 times a day. This information is not intended to replace advice given to you by your health care provider. Make sure you discuss any questions you have with your health care provider. Document Revised: 06/14/2018 Document Reviewed: 10/26/2017 Elsevier Patient Education  2021 [employer].

## 2020-09-21 NOTE — Anesthesia Preprocedure Evaluation (Signed)
Anesthesia Evaluation  Patient identified by MRN, date of birth, ID band Patient awake    Reviewed: Allergy & Precautions, H&P , NPO status , Patient's Chart, lab work & pertinent test results, reviewed documented beta blocker date and time   Airway Mallampati: II  TM Distance: >3 FB Neck ROM: full    Dental no notable dental hx.    Pulmonary neg pulmonary ROS,    Pulmonary exam normal breath sounds clear to auscultation       Cardiovascular Exercise Tolerance: Good hypertension, negative cardio ROS   Rhythm:regular Rate:Normal     Neuro/Psych PSYCHIATRIC DISORDERS Anxiety Depression negative neurological ROS     GI/Hepatic negative GI ROS, Neg liver ROS,   Endo/Other  negative endocrine ROSdiabetes  Renal/GU negative Renal ROS  negative genitourinary   Musculoskeletal   Abdominal   Peds  Hematology  (+) Blood dyscrasia, anemia ,   Anesthesia Other Findings   Reproductive/Obstetrics negative OB ROS                             Anesthesia Physical Anesthesia Plan  ASA: II  Anesthesia Plan: General   Post-op Pain Management:    Induction:   PONV Risk Score and Plan: Propofol infusion  Airway Management Planned:   Additional Equipment:   Intra-op Plan:   Post-operative Plan:   Informed Consent: I have reviewed the patients History and Physical, chart, labs and discussed the procedure including the risks, benefits and alternatives for the proposed anesthesia with the patient or authorized representative who has indicated his/her understanding and acceptance.     Dental Advisory Given  Plan Discussed with: CRNA  Anesthesia Plan Comments:         Anesthesia Quick Evaluation

## 2020-09-21 NOTE — Anesthesia Postprocedure Evaluation (Signed)
Anesthesia Post Note  Patient: John [name]  Procedure(s) Performed: COLONOSCOPY WITH PROPOFOL (N/A )  Patient location during evaluation: Endoscopy Anesthesia Type: General Level of consciousness: awake and alert Pain management: pain level controlled Vital Signs Assessment: post-procedure vital signs reviewed and stable Respiratory status: spontaneous breathing, nonlabored ventilation, respiratory function stable and patient connected to nasal cannula oxygen Cardiovascular status: blood pressure returned to baseline and stable Postop Assessment: no apparent nausea or vomiting Anesthetic complications: no   No complications documented.   Last Vitals:  Vitals:   09/21/20 0751 09/21/20 0905  BP: (!) 161/76 (!) 146/88  Pulse: 79 77  Resp: (!) 21 18  Temp: 36.7 C 36.8 C  SpO2: 98% 99%    Last Pain:  Vitals:   09/21/20 0905  TempSrc: Oral  PainSc: 0-No pain                 Ulice Bold Velina Drollinger

## 2020-09-21 NOTE — H&P (Signed)
Primary Care Physician:  Kerri Perches, MD Primary Gastroenterologist:  Dr. Marletta Lor  Pre-Procedure History & Physical: HPI:  John [name] is a 60 y.o. male is here for a colonoscopy to be performed for anemia.   Patient denies any family history of colorectal cancer.  No melena or hematochezia.  No abdominal pain or unintentional weight loss.  No change in bowel habits.    Past Medical History:  Diagnosis Date  . Anxiety   . Depression 2007  . Diabetes mellitus without complication (HCC)   . Helicobacter pylori ab+ 01/21/2019   Dx in 12/2018, will treat  . Hyperlipidemia   . Hypertension   . Hypogonadism male   . Obesity (BMI 30.0-34.9) 03/01/2017    Past Surgical History:  Procedure Laterality Date  . CIRCUMCISION N/A 06/10/2019   Procedure: CIRCUMCISION ADULT;  Surgeon: Malen Gauze, MD;  Location: [facility];  Service: Urology;  Laterality: N/A;  30 MINS  . COLONOSCOPY  01/20/2011   Dr. Darrick Penna: large internal hemorrhoids  . HERNIA REPAIR     ventral hernia repair   . VASECTOMY      Prior to Admission medications   Medication Sig Start Date End Date Taking? Authorizing Provider  amLODipine (NORVASC) 5 MG tablet Take 1 tablet (5 mg total) by mouth daily. 06/22/20  Yes Kerri Perches, MD  atorvastatin (LIPITOR) 20 MG tablet Take 1 tablet by mouth once daily Patient taking differently: Take 20 mg by mouth daily. 03/03/20  Yes Kerri Perches, MD  insulin glargine (LANTUS SOLOSTAR) 100 UNIT/ML Solostar Pen Inject 45 Units into the skin at bedtime. 07/06/20  Yes Reardon, Freddi Starr, NP  lisinopril-hydrochlorothiazide (ZESTORETIC) 20-12.5 MG tablet Take 1 tablet by mouth daily. 03/31/20  Yes Reardon, Freddi Starr, NP  metFORMIN (GLUCOPHAGE) 500 MG tablet TAKE 1 TABLET BY MOUTH TWICE DAILY WITH A MEAL Patient taking differently: Take 500 mg by mouth 2 (two) times daily with a meal. TAKE 1 TABLET BY MOUTH TWICE DAILY WITH A MEAL 03/16/20  Yes Reardon, Freddi Starr, NP  ACCU-CHEK  SOFTCLIX LANCETS lancets 100 each by Other route 4 (four) times daily. Use as instructed 04/30/18   Roma Kayser, MD  ALPRAZolam Prudy Feeler) 1 MG tablet Take 1 tablet (1 mg total) by mouth 2 (two) times daily. 09/16/20   Myrlene Broker, MD  Blood Glucose Monitoring Suppl (ACCU-CHEK AVIVA) device 1 each by Other route 2 (two) times daily. Use as instructed bid. E11.65 05/28/20   Freddy Finner, NP  FLUoxetine (PROZAC) 40 MG capsule Take 1 capsule (40 mg total) by mouth daily. 09/16/20   Myrlene Broker, MD  glucose blood [facility] VERIO) test strip Use as instructed to test blood glucose twice daily. ONE TOUCH VERIO FLEX test strips 06/08/20   Roma Kayser, MD  traZODone (DESYREL) 150 MG tablet Take 1 tablet (150 mg total) by mouth at bedtime. 09/16/20   Myrlene Broker, MD    Allergies as of 08/17/2020 - Review Complete 08/17/2020  Allergen Reaction Noted  . Januvia [sitagliptin] Nausea And Vomiting 01/03/2017  . Poison oak extract [poison oak extract] Dermatitis 01/26/2016    Family History  Problem Relation Age of Onset  . Heart failure Mother        living   . Hypertension Mother   . Hyperlipidemia Mother   . Heart disease Mother   . Hypertension Sister   . Hypertension Brother   . Colon cancer Neg Hx     Social  History   Socioeconomic History  . Marital status: Married    Spouse name: Not on file  . Number of children: 1  . Years of education: Not on file  . Highest education level: Not on file  Occupational History  . Occupation: Dispensing optician  Tobacco Use  . Smoking status: Never Smoker  . Smokeless tobacco: Never Used  Vaping Use  . Vaping Use: Never used  Substance and Sexual Activity  . Alcohol use: Yes    Alcohol/week: 1.0 standard drink    Types: 1 Cans of beer per week    Comment: occasional beer socially.   . Drug use: No  . Sexual activity: Yes  Other Topics Concern  . Not on file  Social History Narrative  . Not on file   Social  Determinants of Health   Financial Resource Strain: Low Risk   . Difficulty of Paying Living Expenses: Not hard at all  Food Insecurity: No Food Insecurity  . Worried About Programme researcher, broadcasting/film/video in the Last Year: Never true  . Ran Out of Food in the Last Year: Never true  Transportation Needs: No Transportation Needs  . Lack of Transportation (Medical): No  . Lack of Transportation (Non-Medical): No  Physical Activity: Insufficiently Active  . Days of Exercise per Week: 7 days  . Minutes of Exercise per Session: 20 min  Stress: No Stress Concern Present  . Feeling of Stress : Not at all  Social Connections: Moderately Integrated  . Frequency of Communication with Friends and Family: Twice a week  . Frequency of Social Gatherings with Friends and Family: Once a week  . Attends Religious Services: More than 4 times per year  . Active Member of Clubs or Organizations: No  . Attends Banker Meetings: Never  . Marital Status: Married  Catering manager Violence: Not At Risk  . Fear of Current or Ex-Partner: No  . Emotionally Abused: No  . Physically Abused: No  . Sexually Abused: No    Review of Systems: See HPI, otherwise negative ROS  Physical Exam: Vital signs in last 24 hours: Temp:  [98 F (36.7 C)] 98 F (36.7 C) (04/04 0751) Pulse Rate:  [79] 79 (04/04 0751) Resp:  [21] 21 (04/04 0751) BP: (161)/(76) 161/76 (04/04 0751) SpO2:  [98 %] 98 % (04/04 0751) Weight:  [90.7 kg] 90.7 kg (04/04 0751)   General:   Alert,  Well-developed, well-nourished, pleasant and cooperative in NAD Head:  Normocephalic and atraumatic. Eyes:  Sclera clear, no icterus.   Conjunctiva pink. Ears:  Normal auditory acuity. Nose:  No deformity, discharge,  or lesions. Mouth:  No deformity or lesions, dentition normal. Neck:  Supple; no masses or thyromegaly. Lungs:  Clear throughout to auscultation.   No wheezes, crackles, or rhonchi. No acute distress. Heart:  Regular rate and rhythm;  no murmurs, clicks, rubs,  or gallops. Abdomen:  Soft, nontender and nondistended. No masses, hepatosplenomegaly or hernias noted. Normal bowel sounds, without guarding, and without rebound.   Msk:  Symmetrical without gross deformities. Normal posture. Extremities:  Without clubbing or edema. Neurologic:  Alert and  oriented x4;  grossly normal neurologically. Skin:  Intact without significant lesions or rashes. Cervical Nodes:  No significant cervical adenopathy. Psych:  Alert and cooperative. Normal mood and affect.  Impression/Plan: John [name] is here for a colonoscopy to be performed for anemia.   The risks of the procedure including infection, bleed, or perforation as well as benefits, limitations,  alternatives and imponderables have been reviewed with the patient. Questions have been answered. All parties agreeable.

## 2020-09-21 NOTE — Transfer of Care (Signed)
Immediate Anesthesia Transfer of Care Note  Patient: John [name]  Procedure(s) Performed: COLONOSCOPY WITH PROPOFOL (N/A )  Patient Location: PACU and Endoscopy Unit  Anesthesia Type:General  Level of Consciousness: awake, alert  and oriented  Airway & Oxygen Therapy: Patient Spontanous Breathing  Post-op Assessment: Report given to RN, Post -op Vital signs reviewed and stable and Patient moving all extremities X 4  Post vital signs: Reviewed and stable  Last Vitals:  Vitals Value Taken Time  BP    Temp    Pulse    Resp    SpO2      Last Pain:  Vitals:   09/21/20 0843  TempSrc:   PainSc: 0-No pain      Patients Stated Pain Goal: 7 (09/21/20 0751)  Complications: No complications documented.

## 2020-09-21 NOTE — Op Note (Signed)
[facility] Patient Name: John [name] Procedure Date: 09/21/2020 8:30 AM MRN: [id number] Date of Birth: [birth year] Attending MD: Elon Alas. Abbey Chatters DO CSN: [id number] Age: 60 Admit Type: Outpatient Procedure:                Colonoscopy Indications:              Iron deficiency anemia Providers:                Elon Alas. Abbey Chatters, DO, Gwenlyn Fudge, RN, Aram Candela Referring MD:              Medicines:                See the Anesthesia note for documentation of the                            administered medications Complications:            No immediate complications. Estimated Blood Loss:     Estimated blood loss: none. Procedure:                Pre-Anesthesia Assessment:                           - The anesthesia plan was to use monitored                            anesthesia care (MAC).                           After obtaining informed consent, the colonoscope                            was passed under direct vision. Throughout the                            procedure, the patient's blood pressure, pulse, and                            oxygen saturations were monitored continuously. The                            [id number]-[id number] ([id number]) scope was introduced through                            the anus and advanced to the the cecum, identified                            by appendiceal orifice and ileocecal valve. The                            colonoscopy was performed without difficulty. The                            patient tolerated the procedure well. The quality  of the bowel preparation was evaluated using the                            BBPS [facility] Bowel Preparation Scale) with scores                            of: Right Colon = 2 (minor amount of residual                            staining, small fragments of stool and/or opaque                            liquid, but mucosa seen well), Transverse Colon = 2                             (minor amount of residual staining, small fragments                            of stool and/or opaque liquid, but mucosa seen                            well) and Left Colon = 2 (minor amount of residual                            staining, small fragments of stool and/or opaque                            liquid, but mucosa seen well). The total BBPS score                            equals 6. The quality of the bowel preparation was                            fair. Scope In: 1:47:82 AM Scope Out: 8:57:53 AM Scope Withdrawal Time: 0 hours 7 minutes 48 seconds  Total Procedure Duration: 0 hours 11 minutes 40 seconds  Findings:      The perianal and digital rectal examinations were normal.      Non-bleeding internal hemorrhoids were found during endoscopy.      The exam was otherwise without abnormality. Impression:               - Preparation of the colon was fair.                           - Non-bleeding internal hemorrhoids.                           - The examination was otherwise normal.                           - No specimens collected. Moderate Sedation:      Per Anesthesia Care Recommendation:           - Patient has a contact number available for  emergencies. The signs and symptoms of potential                            delayed complications were discussed with the                            patient. Return to normal activities tomorrow.                            Written discharge instructions were provided to the                            patient.                           - Resume previous diet.                           - Continue present medications.                           - Repeat colonoscopy in 10 years for screening                            purposes.                           - Return to GI clinic in 3 months. Consider GIVENs                            capsule for completeness. Procedure Code(s):        ---  Professional ---                           [phone], Colonoscopy, flexible; diagnostic, including                            collection of specimen(s) by brushing or washing,                            when performed (separate procedure) Diagnosis Code(s):        --- Professional ---                           K64.8, Other hemorrhoids                           D50.9, Iron deficiency anemia, unspecified CPT copyright 2019 American Medical Association. All rights reserved. The codes documented in this report are preliminary and upon coder review may  be revised to meet current compliance requirements. Elon Alas. Abbey Chatters, DO [place] Abbey Chatters, DO 09/21/2020 9:03:55 AM This report has been signed electronically. Number of Addenda: 0

## 2020-09-25 ENCOUNTER — Encounter (HOSPITAL_COMMUNITY): Payer: Self-pay | Admitting: Internal Medicine

## 2020-09-29 ENCOUNTER — Ambulatory Visit (INDEPENDENT_AMBULATORY_CARE_PROVIDER_SITE_OTHER): Payer: Medicare Other | Admitting: Family Medicine

## 2020-09-29 ENCOUNTER — Encounter: Payer: Self-pay | Admitting: Family Medicine

## 2020-09-29 ENCOUNTER — Other Ambulatory Visit: Payer: Self-pay

## 2020-09-29 VITALS — BP 149/90 | HR 80 | Resp 16 | Ht 68.0 in | Wt 199.0 lb

## 2020-09-29 DIAGNOSIS — I10 Essential (primary) hypertension: Secondary | ICD-10-CM

## 2020-09-29 DIAGNOSIS — E1169 Type 2 diabetes mellitus with other specified complication: Secondary | ICD-10-CM | POA: Diagnosis not present

## 2020-09-29 DIAGNOSIS — E782 Mixed hyperlipidemia: Secondary | ICD-10-CM | POA: Diagnosis not present

## 2020-09-29 DIAGNOSIS — E669 Obesity, unspecified: Secondary | ICD-10-CM | POA: Diagnosis not present

## 2020-09-29 DIAGNOSIS — F324 Major depressive disorder, single episode, in partial remission: Secondary | ICD-10-CM

## 2020-09-29 LAB — POCT GLYCOSYLATED HEMOGLOBIN (HGB A1C): HbA1c, POC (controlled diabetic range): 8.9 % — AB (ref 0.0–7.0)

## 2020-09-29 MED ORDER — INSULIN GLARGINE 100 UNIT/ML SOLOSTAR PEN: PEN_INJECTOR | SUBCUTANEOUS | 11 refills | Status: DC

## 2020-09-29 MED ORDER — AMLODIPINE BESYLATE 10 MG PO TABS: 10.0000 mg | ORAL_TABLET | Freq: Every day | ORAL | 5 refills | Status: DC

## 2020-09-29 NOTE — Patient Instructions (Addendum)
Lipid, cmp and eGFr ,  today  Increase amlodipine to 10 mg daily for blood pressure  Increase lantus to 50 units two times daily  Annual physical exam in office with mD in 2 months   Please bring covid vaccine record to next visit  It is important that you exercise regularly at least 30 minutes 5 times a week. If you develop chest pain, have severe difficulty breathing, or feel very tired, stop exercising immediately and seek medical attention  Thanks for choosing [facility], we consider it a privelige to serve you.

## 2020-09-30 ENCOUNTER — Other Ambulatory Visit: Payer: Self-pay | Admitting: Family Medicine

## 2020-09-30 LAB — CMP14+EGFR
ALT: 26 IU/L (ref 0–44)
AST: 20 IU/L (ref 0–40)
Albumin/Globulin Ratio: 1.6 (ref 1.2–2.2)
Albumin: 4.7 g/dL (ref 3.8–4.9)
Alkaline Phosphatase: 141 IU/L — ABNORMAL HIGH (ref 44–121)
BUN/Creatinine Ratio: 10 (ref 10–24)
BUN: 12 mg/dL (ref 8–27)
Bilirubin Total: 0.5 mg/dL (ref 0.0–1.2)
CO2: 24 mmol/L (ref 20–29)
Calcium: 10.3 mg/dL — ABNORMAL HIGH (ref 8.6–10.2)
Chloride: 99 mmol/L (ref 96–106)
Creatinine, Ser: 1.19 mg/dL (ref 0.76–1.27)
Globulin, Total: 3 g/dL (ref 1.5–4.5)
Glucose: 150 mg/dL — ABNORMAL HIGH (ref 65–99)
Potassium: 4.6 mmol/L (ref 3.5–5.2)
Sodium: 141 mmol/L (ref 134–144)
Total Protein: 7.7 g/dL (ref 6.0–8.5)
eGFR: 70 mL/min/{1.73_m2} (ref >59)

## 2020-09-30 LAB — LIPID PANEL
Chol/HDL Ratio: 4.9 ratio (ref 0.0–5.0)
Cholesterol, Total: 211 mg/dL — ABNORMAL HIGH (ref 100–199)
HDL: 43 mg/dL (ref >39)
LDL Chol Calc (NIH): 146 mg/dL — ABNORMAL HIGH (ref 0–99)
Triglycerides: 124 mg/dL (ref 0–149)
VLDL Cholesterol Cal: 22 mg/dL (ref 5–40)

## 2020-09-30 MED ORDER — ATORVASTATIN CALCIUM 40 MG PO TABS
40.0000 mg | ORAL_TABLET | Freq: Every day | ORAL | 3 refills | Status: DC
Start: 2020-09-30 — End: 2021-07-14

## 2020-09-30 NOTE — Progress Notes (Signed)
ERROR per MD. See new med prescribed

## 2020-10-02 ENCOUNTER — Encounter: Payer: Self-pay | Admitting: Family Medicine

## 2020-10-02 NOTE — Assessment & Plan Note (Signed)
Controlled, no change in medication Managed by Psych 

## 2020-10-02 NOTE — Progress Notes (Signed)
John [name]     MRN: [id number]      DOB: [birth year]   HPI John [name] is here for follow up and re-evaluation of chronic medical conditions, medication management and review of any available recent lab and radiology data.  Preventive health is updated, specifically  Cancer screening and Immunization.   Questions or concerns regarding consultations or procedures which the PT has had in the interim are  addressed. The PT denies any adverse reactions to current medications since the last visit.  There are no new concerns.  There are no specific complaints   ROS Denies recent fever or chills. Denies sinus pressure, nasal congestion, ear pain or sore throat. Denies chest congestion, productive cough or wheezing. Denies chest pains, palpitations and leg swelling Denies abdominal pain, nausea, vomiting,diarrhea or constipation.   Denies dysuria, frequency, hesitancy or incontinence. Denies joint pain, swelling and limitation in mobility. Denies headaches, seizures, numbness, or tingling. Denies depression, anxiety or insomnia. Denies skin break down or rash.   PE  BP (!) 149/90   Pulse 80   Resp 16   Ht 5\' 8"  (1.727 m)   Wt 199 lb (90.3 kg)   SpO2 96%   BMI 30.26 kg/m   Patient alert and oriented and in no cardiopulmonary distress.  HEENT: No facial asymmetry, EOMI,     Neck supple .  Chest: Clear to auscultation bilaterally.  CVS: S1, S2 no murmurs, no S3.Regular rate.  ABD: Soft non tender.   Ext: No edema  MS: Adequate ROM spine, shoulders, hips and knees.  Skin: Intact, no ulcerations or rash noted.  Psych: Good eye contact, normal affect. Memory intact not anxious or depressed appearing.  CNS: CN 2-12 intact, power,  normal throughout.no focal deficits noted.   Assessment & Plan  Essential hypertension Uncontrolled, increaseamlodipine and re evauate DASH diet and commitment to daily physical activity for a minimum of 30 minutes discussed and  encouraged, as a part of hypertension management. The importance of attaining a healthy weight is also discussed.  BP/Weight 09/29/2020 09/21/2020 08/17/2020 07/06/2020 05/28/2020 03/31/2020 03/16/2020  Systolic BP 149 146 152 - 145 03/18/2020 193  Diastolic BP 90 88 85 - 80 80 95  Wt. (Lbs) 199 200 201.6 189 200 199.6 205.6  BMI 30.26 30.41 30.65 28.74 30.41 30.35 31.26  Some encounter information is confidential and restricted. Go to Review Flowsheets activity to see all data.       Diabetes mellitus type 2 in obese [facility]) John [name] is reminded of the importance of commitment to daily physical activity for 30 minutes or more, as able and the need to limit carbohydrate intake to 30 to 60 grams per meal to help with blood sugar control.   The need to take medication as prescribed, test blood sugar as directed, and to call between visits if there is a concern that blood sugar is uncontrolled is also discussed.   John [name] is reminded of the importance of daily foot exam, annual eye examination, and good blood sugar, blood pressure and cholesterol control. Uncontrolled increase insulin dose  Diabetic Labs Latest Ref Rng & Units 09/29/2020 09/18/2020 07/03/2020 07/01/2020 02/12/2020  HbA1c 0.0 - 7.0 % 8.9(A) - - - 6.4(A)  Microalbumin mg/dL - - - - -  Micro/Creat Ratio <30 mcg/mg creat - - - - -  Chol 100 - 199 mg/dL 02/14/2020) - - - -  HDL 662(H mg/dL 43 - - - -  Calc LDL 0 - 99 mg/dL >47) - - - -  Triglycerides 0 - 149 mg/dL 154 - - - -  Creatinine 0.76 - 1.27 mg/dL 0.08 6.76 1.95 0.93 -   BP/Weight 09/29/2020 09/21/2020 08/17/2020 07/06/2020 05/28/2020 03/31/2020 03/16/2020  Systolic BP 149 146 152 - 145 267 193  Diastolic BP 90 88 85 - 80 80 95  Wt. (Lbs) 199 200 201.6 189 200 199.6 205.6  BMI 30.26 30.41 30.65 28.74 30.41 30.35 31.26  Some encounter information is confidential and restricted. Go to Review Flowsheets activity to see all data.   Foot/eye exam completion dates Latest Ref Rng & Units  07/09/2019 03/18/2019  Eye Exam No Retinopathy No Retinopathy -  Foot Form Completion - - Done        Obesity (BMI 30.0-34.9)  Patient re-educated about  the importance of commitment to a  minimum of 150 minutes of exercise per week as able.  The importance of healthy food choices with portion control discussed, as well as eating regularly and within a 12 hour window most days. The need to choose "clean , green" food 50 to 75% of the time is discussed, as well as to make water the primary drink and set a goal of 64 ounces water daily.    Weight /BMI 09/29/2020 09/21/2020 08/17/2020  WEIGHT 199 lb 200 lb 201 lb 9.6 oz  HEIGHT 5\' 8"  5\' 8"  5\' 8"   BMI 30.26 kg/m2 30.41 kg/m2 30.65 kg/m2  Some encounter information is confidential and restricted. Go to Review Flowsheets activity to see all data.      Depression, major, single episode, in partial remission (HCC) Controlled, no change in medication Managed by Psych  Mixed hyperlipidemia Hyperlipidemia:Low fat diet discussed and encouraged.   Lipid Panel  Lab Results  Component Value Date   CHOL 211 (H) 09/29/2020   HDL 43 09/29/2020   LDLCALC 146 (H) 09/29/2020   TRIG 124 09/29/2020   CHOLHDL 4.9 09/29/2020   Uncontrolled , decrease fat and dose adjust

## 2020-10-02 NOTE — Assessment & Plan Note (Signed)
Uncontrolled, increaseamlodipine and re evauate DASH diet and commitment to daily physical activity for a minimum of 30 minutes discussed and encouraged, as a part of hypertension management. The importance of attaining a healthy weight is also discussed.  BP/Weight 09/29/2020 09/21/2020 08/17/2020 07/06/2020 05/28/2020 03/31/2020 03/16/2020  Systolic BP 149 146 152 - 145 395 193  Diastolic BP 90 88 85 - 80 80 95  Wt. (Lbs) 199 200 201.6 189 200 199.6 205.6  BMI 30.26 30.41 30.65 28.74 30.41 30.35 31.26  Some encounter information is confidential and restricted. Go to Review Flowsheets activity to see all data.

## 2020-10-02 NOTE — Assessment & Plan Note (Signed)
John [name] is reminded of the importance of commitment to daily physical activity for 30 minutes or more, as able and the need to limit carbohydrate intake to 30 to 60 grams per meal to help with blood sugar control.   The need to take medication as prescribed, test blood sugar as directed, and to call between visits if there is a concern that blood sugar is uncontrolled is also discussed.   John [name] is reminded of the importance of daily foot exam, annual eye examination, and good blood sugar, blood pressure and cholesterol control. Uncontrolled increase insulin dose  Diabetic Labs Latest Ref Rng & Units 09/29/2020 09/18/2020 07/03/2020 07/01/2020 02/12/2020  HbA1c 0.0 - 7.0 % 8.9(A) - - - 6.4(A)  Microalbumin mg/dL - - - - -  Micro/Creat Ratio <30 mcg/mg creat - - - - -  Chol 100 - 199 mg/dL 397(Q) - - - -  HDL >73 mg/dL 43 - - - -  Calc LDL 0 - 99 mg/dL 419(F) - - - -  Triglycerides 0 - 149 mg/dL 790 - - - -  Creatinine 0.76 - 1.27 mg/dL 2.40 9.73 5.32 9.92 -   BP/Weight 09/29/2020 09/21/2020 08/17/2020 07/06/2020 05/28/2020 03/31/2020 03/16/2020  Systolic BP 149 146 152 - 145 426 193  Diastolic BP 90 88 85 - 80 80 95  Wt. (Lbs) 199 200 201.6 189 200 199.6 205.6  BMI 30.26 30.41 30.65 28.74 30.41 30.35 31.26  Some encounter information is confidential and restricted. Go to Review Flowsheets activity to see all data.   Foot/eye exam completion dates Latest Ref Rng & Units 07/09/2019 03/18/2019  Eye Exam No Retinopathy No Retinopathy -  Foot Form Completion - - Done

## 2020-10-02 NOTE — Assessment & Plan Note (Signed)
  Patient re-educated about  the importance of commitment to a  minimum of 150 minutes of exercise per week as able.  The importance of healthy food choices with portion control discussed, as well as eating regularly and within a 12 hour window most days. The need to choose "clean , green" food 50 to 75% of the time is discussed, as well as to make water the primary drink and set a goal of 64 ounces water daily.    Weight /BMI 09/29/2020 09/21/2020 08/17/2020  WEIGHT 199 lb 200 lb 201 lb 9.6 oz  HEIGHT 5\' 8"  5\' 8"  5\' 8"   BMI 30.26 kg/m2 30.41 kg/m2 30.65 kg/m2  Some encounter information is confidential and restricted. Go to Review Flowsheets activity to see all data.

## 2020-10-02 NOTE — Assessment & Plan Note (Signed)
Hyperlipidemia:Low fat diet discussed and encouraged.   Lipid Panel  Lab Results  Component Value Date   CHOL 211 (H) 09/29/2020   HDL 43 09/29/2020   LDLCALC 146 (H) 09/29/2020   TRIG 124 09/29/2020   CHOLHDL 4.9 09/29/2020   Uncontrolled , decrease fat and dose adjust

## 2020-10-09 ENCOUNTER — Other Ambulatory Visit (HOSPITAL_COMMUNITY): Payer: Self-pay | Admitting: Psychiatry

## 2020-11-03 ENCOUNTER — Ambulatory Visit: Payer: Medicare Other | Admitting: Nurse Practitioner

## 2020-11-17 ENCOUNTER — Other Ambulatory Visit: Payer: Self-pay

## 2020-11-17 ENCOUNTER — Encounter: Payer: Self-pay | Admitting: Nurse Practitioner

## 2020-11-17 ENCOUNTER — Ambulatory Visit (INDEPENDENT_AMBULATORY_CARE_PROVIDER_SITE_OTHER): Payer: Medicare Other | Admitting: Nurse Practitioner

## 2020-11-17 VITALS — BP 138/79 | HR 79 | Ht 68.0 in | Wt 198.0 lb

## 2020-11-17 DIAGNOSIS — I10 Essential (primary) hypertension: Secondary | ICD-10-CM

## 2020-11-17 DIAGNOSIS — E782 Mixed hyperlipidemia: Secondary | ICD-10-CM | POA: Diagnosis not present

## 2020-11-17 DIAGNOSIS — E669 Obesity, unspecified: Secondary | ICD-10-CM | POA: Diagnosis not present

## 2020-11-17 DIAGNOSIS — E1169 Type 2 diabetes mellitus with other specified complication: Secondary | ICD-10-CM

## 2020-11-17 MED ORDER — INSULIN GLARGINE 100 UNIT/ML SOLOSTAR PEN: 70.0000 [IU] | PEN_INJECTOR | Freq: Every day | SUBCUTANEOUS | 11 refills | Status: DC

## 2020-11-17 NOTE — Progress Notes (Signed)
11/17/2020    Endocrinology Follow Up Visit    Subjective:    Patient ID: John [name], male    DOB: 04/04/1961. Patient is being engaged in follow-up for management of chronically uncontrolled type 2 diabetes, hyperlipidemia, hypertension. PMD:   Kerri Perches, MD  Past Medical History:  Diagnosis Date  . Anxiety   . Depression 2007  . Diabetes mellitus without complication (HCC)   . Helicobacter pylori ab+ 01/21/2019   Dx in 12/2018, will treat  . Hyperlipidemia   . Hypertension   . Hypogonadism male   . Obesity (BMI 30.0-34.9) 03/01/2017   Past Surgical History:  Procedure Laterality Date  . CIRCUMCISION N/A 06/10/2019   Procedure: CIRCUMCISION ADULT;  Surgeon: Malen Gauze, MD;  Location: [facility];  Service: Urology;  Laterality: N/A;  30 MINS  . COLONOSCOPY  01/20/2011   Dr. Darrick Penna: large internal hemorrhoids  . COLONOSCOPY WITH PROPOFOL N/A 09/21/2020   Procedure: COLONOSCOPY WITH PROPOFOL;  Surgeon: Lanelle Bal, DO;  Location: [facility];  Service: Endoscopy;  Laterality: N/A;  AM-diabetic  . HERNIA REPAIR     ventral hernia repair   . VASECTOMY     Social History   Socioeconomic History  . Marital status: Married    Spouse name: Not on file  . Number of children: 1  . Years of education: Not on file  . Highest education level: Not on file  Occupational History  . Occupation: Dispensing optician  Tobacco Use  . Smoking status: Never Smoker  . Smokeless tobacco: Never Used  Vaping Use  . Vaping Use: Never used  Substance and Sexual Activity  . Alcohol use: Yes    Alcohol/week: 1.0 standard drink    Types: 1 Cans of beer per week    Comment: occasional beer socially.   . Drug use: No  . Sexual activity: Yes  Other Topics Concern  . Not on file  Social History Narrative  . Not on file   Social Determinants of Health   Financial Resource Strain: Low Risk   . Difficulty of Paying Living Expenses: Not hard at all  Food Insecurity: No Food  Insecurity  . Worried About Programme researcher, broadcasting/film/video in the Last Year: Never true  . Ran Out of Food in the Last Year: Never true  Transportation Needs: No Transportation Needs  . Lack of Transportation (Medical): No  . Lack of Transportation (Non-Medical): No  Physical Activity: Insufficiently Active  . Days of Exercise per Week: 7 days  . Minutes of Exercise per Session: 20 min  Stress: No Stress Concern Present  . Feeling of Stress : Not at all  Social Connections: Moderately Integrated  . Frequency of Communication with Friends and Family: Twice a week  . Frequency of Social Gatherings with Friends and Family: Once a week  . Attends Religious Services: More than 4 times per year  . Active Member of Clubs or Organizations: No  . Attends Banker Meetings: Never  . Marital Status: Married   Outpatient Encounter Medications as of 11/17/2020  Medication Sig  . ACCU-CHEK SOFTCLIX LANCETS lancets 100 each by Other route 4 (four) times daily. Use as instructed  . ALPRAZolam (XANAX) 1 MG tablet Take 1 tablet (1 mg total) by mouth 2 (two) times daily.  [name] amLODipine (NORVASC) 10 MG tablet Take 1 tablet (10 mg total) by mouth daily.  [name] atorvastatin (LIPITOR) 40 MG tablet Take 1 tablet (40 mg total) by mouth daily.  [name]  Blood Glucose Monitoring Suppl (ACCU-CHEK AVIVA) device 1 each by Other route 2 (two) times daily. Use as instructed bid. E11.65  . FLUoxetine (PROZAC) 40 MG capsule Take 1 capsule (40 mg total) by mouth daily.  [name] glucose blood (ONETOUCH VERIO) test strip Use as instructed to test blood glucose twice daily. ONE TOUCH VERIO FLEX test strips  . insulin glargine (LANTUS) 100 UNIT/ML Solostar Pen Inject 70 Units into the skin at bedtime.  [name] lisinopril-hydrochlorothiazide (ZESTORETIC) 20-12.5 MG tablet Take 1 tablet by mouth daily.  . metFORMIN (GLUCOPHAGE) 500 MG tablet TAKE 1 TABLET BY MOUTH TWICE DAILY WITH A MEAL (Patient taking differently: Take 500 mg by mouth 2 (two) times  daily with a meal. TAKE 1 TABLET BY MOUTH TWICE DAILY WITH A MEAL)  . traZODone (DESYREL) 150 MG tablet Take 1 tablet (150 mg total) by mouth at bedtime.  . [DISCONTINUED] insulin glargine (LANTUS) 100 UNIT/ML Solostar Pen Inject 50 units into skin two times daily (Patient taking differently: 40 Units. Inject 50 units into skin two times daily)   No facility-administered encounter medications on file as of 11/17/2020.   ALLERGIES: Allergies  Allergen Reactions  . Januvia [Sitagliptin] Nausea And Vomiting  . Poison Oak Extract [Poison Oak Extract] Dermatitis   VACCINATION STATUS: Immunization History  Administered Date(s) Administered  . Influenza Whole 03/18/2010, 04/13/2011  . Influenza,inj,Quad PF,6+ Mos 05/20/2013, 05/08/2014, 06/11/2015, 04/18/2017, 05/23/2018, 03/18/2019, 02/12/2020  . Janssen (J&J) SARS-COV-2 Vaccination 11/02/2019  . Pneumococcal Polysaccharide-23 11/26/2013, 03/18/2019  . Td 09/15/2009    Diabetes He presents for his follow-up diabetic visit. He has type 2 diabetes mellitus. Onset time: He was diagnosed at age 67. His disease course has been improving. Hypoglycemia symptoms include nervousness/anxiousness, sweats and tremors. Pertinent negatives for hypoglycemia include no headaches or seizures. Associated symptoms include blurred vision. Pertinent negatives for diabetes include no chest pain, no polydipsia and no polyuria. Hypoglycemia complications include nocturnal hypoglycemia. Symptoms are stable. Diabetic complications include nephropathy. Risk factors for coronary artery disease include dyslipidemia, diabetes mellitus, hypertension, male sex, obesity and sedentary lifestyle. Current diabetic treatment includes insulin injections and oral agent (monotherapy). He is compliant with treatment most of the time. His weight is fluctuating minimally. He is following a generally healthy (admits to drinking sweet tea at times) diet. When asked about meal planning, he  reported none. He has not had a previous visit with a dietitian. He participates in exercise three times a week. His home blood glucose trend is decreasing steadily. His breakfast blood glucose range is generally 70-90 mg/dl. His overall blood glucose range is 140-180 mg/dl. (He presents today with his meter and logs showing tight fasting and near target postprandial glycemic profile.  His most recent A1c was 8.9% on 4/12.  His PCP changed his Lantus to 40 units BID and he has had more frequent fasting hypoglycemia reported.  He does forget to take his medications at times as he is the primary caregiver for his wife with severe memory impairment.) An ACE inhibitor/angiotensin II receptor blocker is being taken. He does not see a podiatrist.Eye exam is current.  Hyperlipidemia This is a chronic problem. The current episode started more than 1 year ago. The problem is uncontrolled. Recent lipid tests were reviewed and are high. Exacerbating diseases include chronic renal disease, diabetes and obesity. Factors aggravating his hyperlipidemia include fatty foods. Pertinent negatives include no chest pain, myalgias or shortness of breath. Current antihyperlipidemic treatment includes statins. The current treatment provides mild improvement of lipids. Compliance problems  include adherence to diet and adherence to exercise.  Risk factors for coronary artery disease include diabetes mellitus, hypertension, male sex, obesity, a sedentary lifestyle and dyslipidemia.  Hypertension This is a chronic problem. The current episode started more than 1 year ago. The problem has been gradually improving since onset. The problem is controlled. Associated symptoms include blurred vision and sweats. Pertinent negatives include no chest pain, headaches, palpitations or shortness of breath. There are no associated agents to hypertension. Risk factors for coronary artery disease include dyslipidemia, diabetes mellitus, male gender,  obesity and sedentary lifestyle. Past treatments include ACE inhibitors, diuretics and calcium channel blockers. The current treatment provides moderate improvement. Compliance problems include diet and exercise.  Hypertensive end-organ damage includes kidney disease. Identifiable causes of hypertension include chronic renal disease.    Review of systems  Constitutional: + Minimally fluctuating body weight,  current Body mass index is 30.11 kg/m. , no fatigue, no subjective hyperthermia, no subjective hypothermia Eyes: no blurry vision, no xerophthalmia ENT: no sore throat, no nodules palpated in throat, no dysphagia/odynophagia, no hoarseness Cardiovascular: no chest pain, no shortness of breath, no palpitations, no leg swelling Respiratory: no cough, no shortness of breath Gastrointestinal: no nausea/vomiting/diarrhea Musculoskeletal: no muscle/joint aches Skin: no rashes, no hyperemia Neurological: no tremors, no numbness, no tingling, no dizziness Psychiatric: no depression, no anxiety, reports significant stress r/t caring for his wife with severe memory impairment   Objective:    BP 138/79   Pulse 79   Ht  (1.727 m)   Wt 198 lb (89.8 kg)   BMI 30.11 kg/m   Wt Readings from Last 3 Encounters:  11/17/20 198 lb (89.8 kg)  09/29/20 199 lb (90.3 kg)  09/21/20 200 lb (90.7 kg)    BP Readings from Last 3 Encounters:  11/17/20 138/79  09/29/20 (!) 149/90  09/21/20 (!) 146/88      Physical Exam- Limited  Constitutional:  Body mass index is 30.11 kg/m. , not in acute distress, normal state of mind Eyes:  EOMI, no exophthalmos Neck: Supple Cardiovascular: RRR, no murmurs, rubs, or gallops, no edema Respiratory: Adequate breathing efforts, no crackles, rales, rhonchi, or wheezing Musculoskeletal: no gross deformities, strength intact in all four extremities, no gross restriction of joint movements Skin:  no rashes, no hyperemia Neurological: no tremor with outstretched  hands   Foot exam:   No rashes, ulcers, cuts, calluses, onychodystrophy.   Good pulses bilat.  Good sensation to 10 g monofilament bilat.   CMP     Component Value Date/Time   NA 141 09/29/2020 1136   K 4.6 09/29/2020 1136   CL 99 09/29/2020 1136   CO2 24 09/29/2020 1136   GLUCOSE 150 (H) 09/29/2020 1136   GLUCOSE 292 (H) 09/18/2020 1300   BUN 12 09/29/2020 1136   CREATININE 1.19 09/29/2020 1136   CREATININE 1.38 (H) 01/07/2020 0729   CALCIUM 10.3 (H) 09/29/2020 1136   PROT 7.7 09/29/2020 1136   ALBUMIN 4.7 09/29/2020 1136   AST 20 09/29/2020 1136   ALT 26 09/29/2020 1136   ALKPHOS 141 (H) 09/29/2020 1136   BILITOT 0.5 09/29/2020 1136   GFRNONAA >60 09/18/2020 1300   GFRNONAA 56 (L) 01/07/2020 0729   GFRAA 73 07/03/2020 1038   GFRAA 64 01/07/2020 0729    Lab Results  Component Value Date   HGBA1C 8.9 (A) 09/29/2020   HGBA1C 6.4 (A) 02/12/2020   HGBA1C 5.6 06/10/2019    Lipid Panel     Component Value Date/Time  CHOL 211 (H) 09/29/2020 1136   TRIG 124 09/29/2020 1136   HDL 43 09/29/2020 1136   CHOLHDL 4.9 09/29/2020 1136   CHOLHDL 4.2 01/07/2020 0729   VLDL 64 (H) 12/30/2016 0950   LDLCALC 146 (H) 09/29/2020 1136   LDLCALC 99 01/07/2020 0729     Assessment & Plan:   1) Diabetes mellitus type 2 in obese [facility])  - Patient has currently uncontrolled symptomatic type 2 DM since  60 years of age.  He presents today with his meter and logs showing tight fasting and near target postprandial glycemic profile.  His most recent A1c was 8.9% on 4/12.  His PCP changed his Lantus to 40 units BID and he has had more frequent fasting hypoglycemia reported.  He does forget to take his medications at times as he is the primary caregiver for his wife with severe memory impairment.   His diabetes is complicated by obesity , prior history of noncompliance/nonadherence, and patient remains at a high risk for more acute and chronic complications of diabetes which include  CAD, CVA, CKD, retinopathy, and neuropathy. These are all discussed in detail with the patient.  - Nutritional counseling repeated at each appointment due to patients tendency to fall back in to old habits.  - The patient admits there is a room for improvement in their diet and drink choices. -  Suggestion is made for the patient to avoid simple carbohydrates from their diet including Cakes, Sweet Desserts / Pastries, Ice Cream, Soda (diet and regular), Sweet Tea, Candies, Chips, Cookies, Sweet Pastries, Store Bought Juices, Alcohol in Excess of 1-2 drinks a day, Artificial Sweeteners, Coffee Creamer, and "Sugar-free" Products. This will help patient to have stable blood glucose profile and potentially avoid unintended weight gain.   - I encouraged the patient to switch to unprocessed or minimally processed complex starch and increased protein intake (animal or plant source), fruits, and vegetables.   - Patient is advised to stick to a routine mealtimes to eat 3 meals a day and avoid unnecessary snacks (to snack only to correct hypoglycemia).  - I have approached patient with the following individualized plan to manage diabetes and patient agrees:   -Given his tight fasting glycemic profile, he is advised to lower his Lantus to 70 units SQ nightly (instead of splitting in two doses-will make easier to be consistent with taking his meds).  He can continue Metforimin 500 mg po twice daily with meals.     -He is strongly encouraged to monitor glucose at least twice daily, before breakfast and before bed, and call the clinic if he has readings less than 70 or greater than 200 for 3 tests in a row.  He is aware that his insulin may need further adjustment to avoid hypoglycemia.  - Patient will be considered for incretin therapy as appropriate next visit.  2) BP/HTN: His blood pressure is controlled to target.  He is advised to continue Amlodipine 10 mg po daily and Lisinopril-HCT 20-12.5 mg po daily.    3) Lipids/HPL: His most recent lipid panel from 09/29/20 shows uncontrolled LDL at 146.  He is advised to continue Lipitor 40 mg po daily at bedtime (recently increased by his PCP).  Side effects and precautions discussed with him.  4) Chronic Care/Health Maintenance: -Patient is on ACEI/ARB and Statin medications and encouraged to continue to follow up with Ophthalmology, Podiatrist at least yearly or according to recommendations, and advised to stay away from smoking. I have recommended yearly flu vaccine  and pneumonia vaccination at least every 5 years; moderate intensity exercise for up to 150 minutes weekly; and  sleep for at least 7 hours a day.  - I advised patient to maintain close follow up with Kerri PerchesSimpson, Margaret E, MD for primary care needs.     I spent 42 minutes in the care of the patient today including review of labs from CMP, Lipids, Thyroid Function, Hematology (current and previous including abstractions from other facilities); face-to-face time discussing  his blood glucose readings/logs, discussing hypoglycemia and hyperglycemia episodes and symptoms, medications doses, his options of short and long term treatment based on the latest standards of care / guidelines;  discussion about incorporating lifestyle medicine;  and documenting the encounter.    Please refer to Patient Instructions for Blood Glucose Monitoring and Insulin/Medications Dosing Guide"  in media tab for additional information. Please  also refer to " Patient Self Inventory" in the Media  tab for reviewed elements of pertinent patient history.  Marye RoundGary K Fabio participated in the discussions, expressed understanding, and voiced agreement with the above plans.  All questions were answered to his satisfaction. he is encouraged to contact clinic should he have any questions or concerns prior to his return visit.    Follow up plan: - Return in about 3 months (around 02/17/2021) for Diabetes F/U with A1c in office,  No previsit labs, Bring meter and logs.    Ronny BaconWhitney Adeliz Tonkinson, Regional Health Custer HospitalFNP-BC [facility] [address] [place], [place] [address] Phone: [phone] Fax: [phone]  11/17/2020, 11:40 AM

## 2020-11-17 NOTE — Patient Instructions (Signed)

## 2020-11-25 ENCOUNTER — Encounter: Payer: Self-pay | Admitting: Gastroenterology

## 2020-12-02 ENCOUNTER — Encounter: Payer: Medicare Other | Admitting: Family Medicine

## 2020-12-09 ENCOUNTER — Telehealth: Payer: Self-pay | Admitting: *Deleted

## 2020-12-09 NOTE — Telephone Encounter (Signed)
Pt was on recall list.  Looks like he had procedure on 09/21/2020. He needs a 10 year repeat and was supposed to follow up 3 months.  Can we arrange and NIC please?

## 2020-12-18 ENCOUNTER — Telehealth: Payer: Self-pay | Admitting: Gastroenterology

## 2020-12-18 ENCOUNTER — Ambulatory Visit: Payer: Medicare Other | Admitting: Gastroenterology

## 2020-12-18 NOTE — Telephone Encounter (Signed)
Follow up on pending orders.   Patient has not completed H.pylori breath test. He will need to be off antibiotics and any acid reflux medications prior to testing. Order is already in.  He also needs orders for CBC, iron/tibc/ferritin for dx: anemia.

## 2020-12-22 NOTE — Telephone Encounter (Signed)
John [name], I phoned the pt and advised of the pending orders for the H.Pylori test. The pt advised me that he didn't know anything about having this test done so I referred back to the office note. Pt states he doesn't take any medication for his stomach. Pt declines to have H.Pylori breath test done . He advised me that he is going to see his PCP this month to follow up with her. I advised the pt of blood work and he also declined this. Please advise.

## 2020-12-23 NOTE — Telephone Encounter (Signed)
noted 

## 2020-12-23 NOTE — Telephone Encounter (Signed)
Noted regarding patient declined work up. Can follow up at next ov.

## 2021-01-03 ENCOUNTER — Telehealth (HOSPITAL_COMMUNITY): Payer: Self-pay | Admitting: Psychiatry

## 2021-01-03 DIAGNOSIS — F322 Major depressive disorder, single episode, severe without psychotic features: Secondary | ICD-10-CM

## 2021-01-04 ENCOUNTER — Encounter: Payer: Medicare Other | Admitting: Family Medicine

## 2021-01-05 NOTE — Telephone Encounter (Signed)
We will send limited supply of Xanax. We will route this message to Dr. Tenny Craw to address this when she is back in office.

## 2021-01-05 NOTE — Telephone Encounter (Signed)
noted 

## 2021-01-08 ENCOUNTER — Ambulatory Visit (INDEPENDENT_AMBULATORY_CARE_PROVIDER_SITE_OTHER): Payer: Medicare Other | Admitting: Family Medicine

## 2021-01-08 ENCOUNTER — Other Ambulatory Visit: Payer: Self-pay

## 2021-01-08 ENCOUNTER — Encounter: Payer: Self-pay | Admitting: Family Medicine

## 2021-01-08 VITALS — BP 166/76 | HR 77 | Resp 16 | Ht 68.0 in | Wt 197.0 lb

## 2021-01-08 DIAGNOSIS — E663 Overweight: Secondary | ICD-10-CM

## 2021-01-08 DIAGNOSIS — I10 Essential (primary) hypertension: Secondary | ICD-10-CM | POA: Diagnosis not present

## 2021-01-08 DIAGNOSIS — E1169 Type 2 diabetes mellitus with other specified complication: Secondary | ICD-10-CM | POA: Diagnosis not present

## 2021-01-08 DIAGNOSIS — Z0001 Encounter for general adult medical examination with abnormal findings: Secondary | ICD-10-CM

## 2021-01-08 DIAGNOSIS — E669 Obesity, unspecified: Secondary | ICD-10-CM | POA: Diagnosis not present

## 2021-01-08 DIAGNOSIS — F324 Major depressive disorder, single episode, in partial remission: Secondary | ICD-10-CM

## 2021-01-08 MED ORDER — LISINOPRIL-HYDROCHLOROTHIAZIDE 20-25 MG PO TABS: 1.0000 | ORAL_TABLET | Freq: Every day | ORAL | 3 refills | Status: DC

## 2021-01-08 NOTE — Patient Instructions (Addendum)
F/U in September, re eval blood pressure, and flu vaccine at visit  New higher dose of lisinopril/ hctz is 20/25 one daily stop old dose Continue amlodipine 10 mg daily  Please get covid booster asap  Need shingrix vaccines, check pharmacy  Lipid, cmp and EGFR today  It is important that you exercise regularly at least 30 minutes 5 times a week. If you develop chest pain, have severe difficulty breathing, or feel very tired, stop exercising immediately and seek medical attention    Think about what you will eat, plan ahead. Choose " clean, green, fresh or frozen" over canned, processed or packaged foods which are more sugary, salty and fatty. 70 to 75% of food eaten should be vegetables and fruit. Three meals at set times with snacks allowed between meals, but they must be fruit or vegetables. Aim to eat over a 12 hour period , example 7 am to 7 pm, and STOP after  your last meal of the day. Drink water,generally about 64 ounces per day, no other drink is as healthy. Fruit juice is best enjoyed in a healthy way, by EATING the fruit. Thanks for choosing [facility], we consider it a privelige to serve you.

## 2021-01-08 NOTE — Progress Notes (Signed)
John [name]     MRN: [id number]      DOB: 11/04/1960   HPI: Patient is in for annual physical exam. Uncontrolled blood pressure is also addresed. Recent labs, if available are reviewed. Immunization is reviewed , and  updated if needed.    PE; BP (!) 166/76   Pulse 77   Resp 16   Ht 5\' 8"  (1.727 m)   Wt 197 lb (89.4 kg)   SpO2 97%   BMI 29.95 kg/m  Pleasant male, alert and oriented x 3, in no cardio-pulmonary distress. Afebrile. HEENT No facial trauma or asymetry. Sinuses non tender. EOMI External ears normal,  Neck: supple, no adenopathy,JVD or thyromegaly.No bruits.  Chest: Clear to ascultation bilaterally.No crackles or wheezes. Non tender to palpation  Cardiovascular system; Heart sounds normal,  S1 and  S2 ,no S3.  No murmur, or thrill. Apical beat not displaced Peripheral pulses normal.  Abdomen: Soft, non tender, no organomegaly or masses. No bruits. Bowel sounds normal. No guarding, tenderness or rebound.    Musculoskeletal exam: Full ROM of spine, hips , shoulders and knees. No deformity ,swelling or crepitus noted. No muscle wasting or atrophy.   Neurologic: Cranial nerves 2 to 12 intact. Power, tone ,sensation and reflexes normal throughout. No disturbance in gait. No tremor.  Skin: Intact, no ulceration, erythema , scaling or rash noted. Pigmentation normal throughout  Psych; Normal mood and affect. Judgement and concentration normal   Assessment & Plan:  Annual visit for general adult medical examination with abnormal findings Annual exam as documented. Counseling done  re healthy lifestyle involving commitment to 150 minutes exercise per week, heart healthy diet, and attaining healthy weight.The importance of adequate sleep also discussed. Regular seat belt use and home safety, is also discussed. Changes in health habits are decided on by the patient with goals and time frames  set for achieving them. Immunization and cancer  screening needs are specifically addressed at this visit.   Essential hypertension Uncontrolled, inc dose of lisinopril/ HCTZ DASH diet and commitment to daily physical activity for a minimum of 30 minutes discussed and encouraged, as a part of hypertension management. The importance of attaining a healthy weight is also discussed.  BP/Weight 01/08/2021 11/17/2020 09/29/2020 09/21/2020 08/17/2020 07/06/2020 05/28/2020  Systolic BP 166 138 149 146 152 - 145  Diastolic BP 76 79 90 88 85 - 80  Wt. (Lbs) 197 198 199 200 201.6 189 200  BMI 29.95 30.11 30.26 30.41 30.65 28.74 30.41  Some encounter information is confidential and restricted. Go to Review Flowsheets activity to see all data.       Overweight (BMI 25.0-29.9)  Patient re-educated about  the importance of commitment to a  minimum of 150 minutes of exercise per week as able.  The importance of healthy food choices with portion control discussed, as well as eating regularly and within a 12 hour window most days. The need to choose "clean , green" food 50 to 75% of the time is discussed, as well as to make water the primary drink and set a goal of 64 ounces water daily.    Weight /BMI 01/08/2021 11/17/2020 09/29/2020  WEIGHT 197 lb 198 lb 199 lb  HEIGHT 5\' 8"  5\' 8"  5\' 8"   BMI 29.95 kg/m2 30.11 kg/m2 30.26 kg/m2  Some encounter information is confidential and restricted. Go to Review Flowsheets activity to see all data.      Depression, major, single episode, in partial remission (HCC) Controlled, no change in  medication Managed by Psych

## 2021-01-09 LAB — CMP14+EGFR
ALT: 34 IU/L (ref 0–44)
AST: 30 IU/L (ref 0–40)
Albumin/Globulin Ratio: 1.6 (ref 1.2–2.2)
Albumin: 4 g/dL (ref 3.8–4.9)
Alkaline Phosphatase: 109 IU/L (ref 44–121)
BUN/Creatinine Ratio: 10 (ref 10–24)
BUN: 11 mg/dL (ref 8–27)
Bilirubin Total: 0.3 mg/dL (ref 0.0–1.2)
CO2: 22 mmol/L (ref 20–29)
Calcium: 9.2 mg/dL (ref 8.6–10.2)
Chloride: 102 mmol/L (ref 96–106)
Creatinine, Ser: 1.13 mg/dL (ref 0.76–1.27)
Globulin, Total: 2.5 g/dL (ref 1.5–4.5)
Glucose: 140 mg/dL — ABNORMAL HIGH (ref 65–99)
Potassium: 4.3 mmol/L (ref 3.5–5.2)
Sodium: 139 mmol/L (ref 134–144)
Total Protein: 6.5 g/dL (ref 6.0–8.5)
eGFR: 74 mL/min/1.73

## 2021-01-09 LAB — LIPID PANEL
Chol/HDL Ratio: 4 ratio (ref 0.0–5.0)
Cholesterol, Total: 171 mg/dL (ref 100–199)
HDL: 43 mg/dL
LDL Chol Calc (NIH): 104 mg/dL — ABNORMAL HIGH (ref 0–99)
Triglycerides: 133 mg/dL (ref 0–149)
VLDL Cholesterol Cal: 24 mg/dL (ref 5–40)

## 2021-01-10 DIAGNOSIS — Z00121 Encounter for routine child health examination with abnormal findings: Secondary | ICD-10-CM | POA: Insufficient documentation

## 2021-01-10 DIAGNOSIS — Z0001 Encounter for general adult medical examination with abnormal findings: Secondary | ICD-10-CM | POA: Insufficient documentation

## 2021-01-10 NOTE — Assessment & Plan Note (Signed)
Uncontrolled, inc dose of lisinopril/ HCTZ DASH diet and commitment to daily physical activity for a minimum of 30 minutes discussed and encouraged, as a part of hypertension management. The importance of attaining a healthy weight is also discussed.  BP/Weight 01/08/2021 11/17/2020 09/29/2020 09/21/2020 08/17/2020 07/06/2020 05/28/2020  Systolic BP 166 138 149 146 152 - 145  Diastolic BP 76 79 90 88 85 - 80  Wt. (Lbs) 197 198 199 200 201.6 189 200  BMI 29.95 30.11 30.26 30.41 30.65 28.74 30.41  Some encounter information is confidential and restricted. Go to Review Flowsheets activity to see all data.

## 2021-01-10 NOTE — Assessment & Plan Note (Signed)

## 2021-01-10 NOTE — Assessment & Plan Note (Signed)
Controlled, no change in medication Managed by Psych 

## 2021-01-10 NOTE — Assessment & Plan Note (Signed)
  Patient re-educated about  the importance of commitment to a  minimum of 150 minutes of exercise per week as able.  The importance of healthy food choices with portion control discussed, as well as eating regularly and within a 12 hour window most days. The need to choose "clean , green" food 50 to 75% of the time is discussed, as well as to make water the primary drink and set a goal of 64 ounces water daily.    Weight /BMI 01/08/2021 11/17/2020 09/29/2020  WEIGHT 197 lb 198 lb 199 lb  HEIGHT 5\' 8"  5\' 8"  5\' 8"   BMI 29.95 kg/m2 30.11 kg/m2 30.26 kg/m2  Some encounter information is confidential and restricted. Go to Review Flowsheets activity to see all data.

## 2021-01-11 ENCOUNTER — Other Ambulatory Visit (HOSPITAL_COMMUNITY): Payer: Self-pay | Admitting: Psychiatry

## 2021-01-11 DIAGNOSIS — F322 Major depressive disorder, single episode, severe without psychotic features: Secondary | ICD-10-CM

## 2021-01-11 MED ORDER — ALPRAZOLAM 1 MG PO TABS: 1.0000 mg | ORAL_TABLET | Freq: Two times a day (BID) | ORAL | 2 refills | Status: DC

## 2021-01-11 NOTE — Telephone Encounter (Signed)
90 day supply sent in 

## 2021-01-29 ENCOUNTER — Telehealth (INDEPENDENT_AMBULATORY_CARE_PROVIDER_SITE_OTHER): Payer: Medicare Other | Admitting: Psychiatry

## 2021-01-29 ENCOUNTER — Encounter (HOSPITAL_COMMUNITY): Payer: Self-pay | Admitting: Psychiatry

## 2021-01-29 ENCOUNTER — Other Ambulatory Visit: Payer: Self-pay

## 2021-01-29 DIAGNOSIS — F418 Other specified anxiety disorders: Secondary | ICD-10-CM | POA: Diagnosis not present

## 2021-01-29 DIAGNOSIS — F322 Major depressive disorder, single episode, severe without psychotic features: Secondary | ICD-10-CM

## 2021-01-29 MED ORDER — ALPRAZOLAM 1 MG PO TABS: 1.0000 mg | ORAL_TABLET | Freq: Two times a day (BID) | ORAL | 2 refills | Status: DC

## 2021-01-29 MED ORDER — TRAZODONE HCL 150 MG PO TABS: 150.0000 mg | ORAL_TABLET | Freq: Every day | ORAL | 2 refills | Status: DC

## 2021-01-29 MED ORDER — FLUOXETINE HCL 40 MG PO CAPS: 40.0000 mg | ORAL_CAPSULE | Freq: Every day | ORAL | 2 refills | Status: DC

## 2021-01-29 NOTE — Progress Notes (Signed)
Virtual Visit via Telephone Note  I connected with John [name] on 01/29/21 at 10:00 AM EDT by telephone and verified that I am speaking with the correct person using two identifiers.  Location: Patient: home Provider: home office   I discussed the limitations, risks, security and privacy concerns of performing an evaluation and management service by telephone and the availability of in person appointments. I also discussed with the patient that there may be a patient responsible charge related to this service. The patient expressed understanding and agreed to proceed.       I discussed the assessment and treatment plan with the patient. The patient was provided an opportunity to ask questions and all were answered. The patient agreed with the plan and demonstrated an understanding of the instructions.   The patient was advised to call back or seek an in-person evaluation if the symptoms worsen or if the condition fails to improve as anticipated.  I provided 15 minutes of non-face-to-face time during this encounter.   Diannia Ruder, MD  [facility] MD/PA/NP OP Progress Note  01/29/2021 10:28 AM John [name]  MRN:  [id number]  Chief Complaint:  Chief Complaint   Anxiety; Depression; Follow-up    HPI: This patient is a 60 year old married black male who lives with his wife in [place].  He had 1 son who died at age 63 in 37 in a motor vehicle accident.  He is on disability.   The patient returns for follow-up after 3 months.  For the most part he is doing well.  He is still taking care of his wife who has had some sort of psychotic illness.  Overall his mood has been good and he is staying busy working in his yard.  He denies significant anxiety or difficulty sleeping.  He denies thoughts of self-harm or suicidal ideation. Visit Diagnosis:    ICD-10-CM   1. Depression with anxiety  F41.8 FLUoxetine (PROZAC) 40 MG capsule    2. Major depressive disorder, single episode, severe  without psychotic features (HCC)  F32.2 ALPRAZolam (XANAX) 1 MG tablet      Past Psychiatric History: none  Past Medical History:  Past Medical History:  Diagnosis Date   Anxiety    Depression 2007   Diabetes mellitus without complication (HCC)    Helicobacter pylori ab+ 01/21/2019   Dx in 12/2018, will treat   Hyperlipidemia    Hypertension    Hypogonadism male    Obesity (BMI 30.0-34.9) 03/01/2017    Past Surgical History:  Procedure Laterality Date   CIRCUMCISION N/A 06/10/2019   Procedure: CIRCUMCISION ADULT;  Surgeon: Malen Gauze, MD;  Location: [facility];  Service: Urology;  Laterality: N/A;  30 MINS   COLONOSCOPY  01/20/2011   Dr. Darrick Penna: large internal hemorrhoids   COLONOSCOPY WITH PROPOFOL N/A 09/21/2020   Procedure: COLONOSCOPY WITH PROPOFOL;  Surgeon: Lanelle Bal, DO;  Location: [facility]ITE;  Service: Endoscopy;  Laterality: N/A;  AM-diabetic   HERNIA REPAIR     ventral hernia repair    VASECTOMY      Family Psychiatric History: see below  Family History:  Family History  Problem Relation Age of Onset   Heart failure Mother        living    Hypertension Mother    Hyperlipidemia Mother    Heart disease Mother    Hypertension Sister    Hypertension Brother    Colon cancer Neg Hx     Social History:  Social History  Socioeconomic History   Marital status: Married    Spouse name: Not on file   Number of children: 1   Years of education: Not on file   Highest education level: Not on file  Occupational History   Occupation: Dispensing optician  Tobacco Use   Smoking status: Never   Smokeless tobacco: Never  Vaping Use   Vaping Use: Never used  Substance and Sexual Activity   Alcohol use: Yes    Alcohol/week: 1.0 standard drink    Types: 1 Cans of beer per week    Comment: occasional beer socially.    Drug use: No   Sexual activity: Yes  Other Topics Concern   Not on file  Social History Narrative   Not on file   Social Determinants of  Health   Financial Resource Strain: Low Risk    Difficulty of Paying Living Expenses: Not hard at all  Food Insecurity: No Food Insecurity   Worried About Programme researcher, broadcasting/film/video in the Last Year: Never true   Ran Out of Food in the Last Year: Never true  Transportation Needs: No Transportation Needs   Lack of Transportation (Medical): No   Lack of Transportation (Non-Medical): No  Physical Activity: Insufficiently Active   Days of Exercise per Week: 7 days   Minutes of Exercise per Session: 20 min  Stress: No Stress Concern Present   Feeling of Stress : Not at all  Social Connections: Moderately Integrated   Frequency of Communication with Friends and Family: Twice a week   Frequency of Social Gatherings with Friends and Family: Once a week   Attends Religious Services: More than 4 times per year   Active Member of [employer] or Organizations: No   Attends Banker Meetings: Never   Marital Status: Married    Allergies:  Allergies  Allergen Reactions   Januvia [Sitagliptin] Nausea And Vomiting   Poison Oak Extract [Poison Oak Extract] Dermatitis    Metabolic Disorder Labs: Lab Results  Component Value Date   HGBA1C 8.9 (A) 09/29/2020   MPG 114.02 06/10/2019   MPG 140 02/26/2019   No results found for: PROLACTIN Lab Results  Component Value Date   CHOL 171 01/08/2021   TRIG 133 01/08/2021   HDL 43 01/08/2021   CHOLHDL 4.0 01/08/2021   VLDL 64 (H) 12/30/2016   LDLCALC 104 (H) 01/08/2021   LDLCALC 146 (H) 09/29/2020   Lab Results  Component Value Date   TSH 1.850 07/03/2020   TSH 2.800 07/01/2020    Therapeutic Level Labs: No results found for: LITHIUM No results found for: VALPROATE No components found for:  CBMZ  Current Medications: Current Outpatient Medications  Medication Sig Dispense Refill   ACCU-CHEK SOFTCLIX LANCETS lancets 100 each by Other route 4 (four) times daily. Use as instructed 100 each 5   ALPRAZolam (XANAX) 1 MG tablet Take 1  tablet (1 mg total) by mouth 2 (two) times daily. 60 tablet 2   amLODipine (NORVASC) 10 MG tablet Take 1 tablet (10 mg total) by mouth daily. 30 tablet 5   atorvastatin (LIPITOR) 40 MG tablet Take 1 tablet (40 mg total) by mouth daily. 90 tablet 3   Blood Glucose Monitoring Suppl (ACCU-CHEK AVIVA) device 1 each by Other route 2 (two) times daily. Use as instructed bid. E11.65 1 each 0   FLUoxetine (PROZAC) 40 MG capsule Take 1 capsule (40 mg total) by mouth daily. 30 capsule 2   glucose blood (ONETOUCH VERIO) test strip Use  as instructed to test blood glucose twice daily. ONE TOUCH VERIO FLEX test strips 100 each 12   insulin glargine (LANTUS) 100 UNIT/ML Solostar Pen Inject 70 Units into the skin at bedtime. 15 mL 11   lisinopril-hydrochlorothiazide (ZESTORETIC) 20-25 MG tablet Take 1 tablet by mouth daily. 90 tablet 3   metFORMIN (GLUCOPHAGE) 500 MG tablet TAKE 1 TABLET BY MOUTH TWICE DAILY WITH A MEAL (Patient taking differently: Take 500 mg by mouth 2 (two) times daily with a meal. TAKE 1 TABLET BY MOUTH TWICE DAILY WITH A MEAL) 180 tablet 3   traZODone (DESYREL) 150 MG tablet Take 1 tablet (150 mg total) by mouth at bedtime. 30 tablet 2   No current facility-administered medications for this visit.     Musculoskeletal: Strength & Muscle Tone: within normal limits Gait & Station: normal Patient leans: N/A  Psychiatric Specialty Exam: Review of Systems  All other systems reviewed and are negative.  There were no vitals taken for this visit.There is no height or weight on file to calculate BMI.  General Appearance: NA  Eye Contact:  NA  Speech:  Clear and Coherent  Volume:  Normal  Mood:  Euthymic  Affect:  NA  Thought Process:  Goal Directed  Orientation:  Full (Time, Place, and Person)  Thought Content: WDL   Suicidal Thoughts:  No  Homicidal Thoughts:  No  Memory:  Immediate;   Good Recent;   Good Remote;   Fair  Judgement:  Good  Insight:  Shallow  Psychomotor Activity:   Normal  Concentration:  Concentration: Fair and Attention Span: Fair  Recall:  Good  Fund of Knowledge: Fair  Language: Good  Akathisia:  No  Handed:  Right  AIMS (if indicated): not done  Assets:  Communication Skills Desire for Improvement Resilience Social Support Talents/Skills  ADL's:  Intact  Cognition: Impaired,  Mild  Sleep:  Good   Screenings: PHQ2-9    Flowsheet Row [facility] Office Visit from 01/08/2021 in [facility] Video Visit from 09/16/2020 in [facility] Video Visit from 06/22/2020 in [facility] Clinical Support from 05/28/2020 in [facility]  PHQ-2 Total Score 0 0 0 0 1      Flowsheet Row Video Visit from 01/29/2021 in [facility] Admission (Discharged) from 09/21/2020 in [facility] Video Visit from 09/16/2020 in [facility]  C-SSRS RISK CATEGORY No Risk Error: Question 6 not populated No Risk        Assessment and Plan: This patient is a 60 year old male with a history of depression anxiety and mild cognitive impairment.  He continues to do well on his current regimen.  He will continue Prozac 40 mg daily for depression, Xanax 1 mg twice daily as needed for anxiety and trazodone 150 mg at bedtime for sleep.  He will return to see me in 3 months   Diannia Ruder, MD 01/29/2021, 10:28 AM

## 2021-02-17 ENCOUNTER — Ambulatory Visit: Payer: Medicare Other | Admitting: Nurse Practitioner

## 2021-02-25 ENCOUNTER — Ambulatory Visit: Payer: Medicare Other | Admitting: Family Medicine

## 2021-03-26 ENCOUNTER — Ambulatory Visit: Payer: Medicare Other | Admitting: Family Medicine

## 2021-04-20 ENCOUNTER — Ambulatory Visit: Payer: Medicare Other | Admitting: Gastroenterology

## 2021-04-20 ENCOUNTER — Encounter: Payer: Self-pay | Admitting: Internal Medicine

## 2021-05-07 ENCOUNTER — Ambulatory Visit (INDEPENDENT_AMBULATORY_CARE_PROVIDER_SITE_OTHER): Payer: Medicare Other | Admitting: Family Medicine

## 2021-05-07 ENCOUNTER — Other Ambulatory Visit: Payer: Self-pay

## 2021-05-07 ENCOUNTER — Encounter: Payer: Self-pay | Admitting: Family Medicine

## 2021-05-07 VITALS — BP 197/89 | HR 84 | Resp 16 | Ht 68.0 in | Wt 181.0 lb

## 2021-05-07 DIAGNOSIS — G4700 Insomnia, unspecified: Secondary | ICD-10-CM | POA: Diagnosis not present

## 2021-05-07 DIAGNOSIS — I10 Essential (primary) hypertension: Secondary | ICD-10-CM | POA: Diagnosis not present

## 2021-05-07 DIAGNOSIS — F324 Major depressive disorder, single episode, in partial remission: Secondary | ICD-10-CM

## 2021-05-07 DIAGNOSIS — E782 Mixed hyperlipidemia: Secondary | ICD-10-CM

## 2021-05-07 DIAGNOSIS — Z23 Encounter for immunization: Secondary | ICD-10-CM | POA: Diagnosis not present

## 2021-05-07 DIAGNOSIS — E1169 Type 2 diabetes mellitus with other specified complication: Secondary | ICD-10-CM | POA: Diagnosis not present

## 2021-05-07 DIAGNOSIS — E669 Obesity, unspecified: Secondary | ICD-10-CM

## 2021-05-07 MED ORDER — CLONIDINE HCL 0.2 MG PO TABS: 0.2000 mg | ORAL_TABLET | Freq: Every day | ORAL | 3 refills | Status: DC

## 2021-05-07 NOTE — Patient Instructions (Addendum)
F/U in 2 months, call if you need me sooner  New additional medication clonidine 0.2 mg one at bedtime, blood pressure is high  Flu and pneumonia 20 vaccine today  Lipid, cmp and EGFr, HBA1C today  Thanks for choosing [facility], we consider it a privelige to serve you.

## 2021-05-08 LAB — CMP14+EGFR
ALT: 23 IU/L (ref 0–44)
AST: 27 IU/L (ref 0–40)
Albumin/Globulin Ratio: 1.9 (ref 1.2–2.2)
Albumin: 4.6 g/dL (ref 3.8–4.8)
Alkaline Phosphatase: 111 IU/L (ref 44–121)
BUN/Creatinine Ratio: 7 — ABNORMAL LOW (ref 10–24)
BUN: 7 mg/dL — ABNORMAL LOW (ref 8–27)
Bilirubin Total: 0.6 mg/dL (ref 0.0–1.2)
CO2: 26 mmol/L (ref 20–29)
Calcium: 10.1 mg/dL (ref 8.6–10.2)
Chloride: 99 mmol/L (ref 96–106)
Creatinine, Ser: 1.07 mg/dL (ref 0.76–1.27)
Globulin, Total: 2.4 g/dL (ref 1.5–4.5)
Glucose: 123 mg/dL — ABNORMAL HIGH (ref 70–99)
Potassium: 5 mmol/L (ref 3.5–5.2)
Sodium: 138 mmol/L (ref 134–144)
Total Protein: 7 g/dL (ref 6.0–8.5)
eGFR: 79 mL/min/{1.73_m2} (ref >59)

## 2021-05-08 LAB — LIPID PANEL
Chol/HDL Ratio: 3.9 ratio (ref 0.0–5.0)
Cholesterol, Total: 205 mg/dL — ABNORMAL HIGH (ref 100–199)
HDL: 52 mg/dL (ref >39)
LDL Chol Calc (NIH): 131 mg/dL — ABNORMAL HIGH (ref 0–99)
Triglycerides: 123 mg/dL (ref 0–149)
VLDL Cholesterol Cal: 22 mg/dL (ref 5–40)

## 2021-05-08 LAB — HEMOGLOBIN A1C
Est. average glucose Bld gHb Est-mCnc: 126 mg/dL
Hgb A1c MFr Bld: 6 % — ABNORMAL HIGH (ref 4.8–5.6)

## 2021-05-08 MED ORDER — EZETIMIBE 10 MG PO TABS: 10.0000 mg | ORAL_TABLET | Freq: Every day | ORAL | 3 refills | Status: DC

## 2021-05-08 NOTE — Assessment & Plan Note (Signed)
Hyperlipidemia:Low fat diet discussed and encouraged.   Lipid Panel  Lab Results  Component Value Date   CHOL 205 (H) 05/07/2021   HDL 52 05/07/2021   LDLCALC 131 (H) 05/07/2021   TRIG 123 05/07/2021   CHOLHDL 3.9 05/07/2021     Uncontrolled, add zetia

## 2021-05-10 ENCOUNTER — Encounter: Payer: Self-pay | Admitting: Family Medicine

## 2021-05-10 NOTE — Progress Notes (Signed)
John [name]     MRN: [id number]      DOB: 12-08-1960   HPI John [name] is here for follow up and re-evaluation of chronic medical conditions, medication management and review of any available recent lab and radiology data.  Preventive health is updated, specifically  Cancer screening and Immunization.   Questions or concerns regarding consultations or procedures which the PT has had in the interim are  addressed. The PT denies any adverse reactions to current medications since the last visit.  Increased stress because of the chronic illness of his spouse who is entirely dependent on him C/o sexual frustration, as spouse is interested in no form of intimacy Denies polyuria, polydipsia, blurred vision , or hypoglycemic episodes.   ROS Denies recent fever or chills. Denies sinus pressure, nasal congestion, ear pain or sore throat. Denies chest congestion, productive cough or wheezing. Denies chest pains, palpitations and leg swelling Denies abdominal pain, nausea, vomiting,diarrhea or constipation.   Denies dysuria, frequency, hesitancy or incontinence. Denies joint pain, swelling and limitation in mobility. Denies headaches, seizures, numbness, or tingling. . Denies skin break down or rash.   PE  BP (!) 197/89   Pulse 84   Resp 16   Ht 5\' 8"  (1.727 m)   Wt 181 lb (82.1 kg)   SpO2 98%   BMI 27.52 kg/m   Patient alert and oriented and in no cardiopulmonary distress.  HEENT: No facial asymmetry, EOMI,     Neck supple .  Chest: Clear to auscultation bilaterally.  CVS: S1, S2 no murmurs, no S3.Regular rate.  ABD: Soft non tender.   Ext: No edema  MS: Adequate ROM spine, shoulders, hips and knees.  Skin: Intact, no ulcerations or rash noted.  Psych: Good eye contact, normal affect. Memory intact not anxious or depressed appearing.  CNS: CN 2-12 intact, power,  normal throughout.no focal deficits noted.   Assessment & Plan  Mixed  hyperlipidemia Hyperlipidemia:Low fat diet discussed and encouraged.   Lipid Panel  Lab Results  Component Value Date   CHOL 205 (H) 05/07/2021   HDL 52 05/07/2021   LDLCALC 131 (H) 05/07/2021   TRIG 123 05/07/2021   CHOLHDL 3.9 05/07/2021     Uncontrolled, add zetia  Diabetes mellitus type 2 in obese (HCC) Controlled, no change in medication John [name] is reminded of the importance of commitment to daily physical activity for 30 minutes or more, as able and the need to limit carbohydrate intake to 30 to 60 grams per meal to help with blood sugar control.   The need to take medication as prescribed, test blood sugar as directed, and to call between visits if there is a concern that blood sugar is uncontrolled is also discussed.   John [name] is reminded of the importance of daily foot exam, annual eye examination, and good blood sugar, blood pressure and cholesterol control. Well controlled continue management  Diabetic Labs Latest Ref Rng & Units 05/07/2021 01/08/2021 09/29/2020 09/18/2020 07/03/2020  HbA1c 4.8 - 5.6 % 6.0(H) - 8.9(A) - -  Microalbumin mg/dL - - - - -  Micro/Creat Ratio <30 mcg/mg creat - - - - -  Chol 100 - 199 mg/dL 07/05/2020) 149(F 026) - -  HDL >39 mg/dL 52 43 43 - -  Calc LDL 0 - 99 mg/dL 378(H) 885(O) 277(A) - -  Triglycerides 0 - 149 mg/dL 128(N 867 672 - -  Creatinine 0.76 - 1.27 mg/dL 094 7.09 6.28 3.66 2.94   BP/Weight 05/07/2021  01/08/2021 11/17/2020 09/29/2020 09/21/2020 08/17/2020 07/06/2020  Systolic BP 197 166 138 149 146 152 -  Diastolic BP 89 76 79 90 88 85 -  Wt. (Lbs) 181 197 198 199 200 201.6 189  BMI 27.52 29.95 30.11 30.26 30.41 30.65 28.74  Some encounter information is confidential and restricted. Go to Review Flowsheets activity to see all data.   Foot/eye exam completion dates Latest Ref Rng & Units 07/09/2019 03/18/2019  Eye Exam No Retinopathy No Retinopathy -  Foot Form Completion - - Done        Insomnia Sleep hygiene reviewed and  written information offered also. Managed by Psych  Depression, major, single episode, in partial remission (HCC) managed by psych, stable but increased anxiety with deterioration in spouse's health and reports no support from family, offered therapy but not interested currently  Essential hypertension Uncontrolled add bedtime clonidine DASH diet and commitment to daily physical activity for a minimum of 30 minutes discussed and encouraged, as a part of hypertension management. The importance of attaining a healthy weight is also discussed.  BP/Weight 05/07/2021 01/08/2021 11/17/2020 09/29/2020 09/21/2020 08/17/2020 07/06/2020  Systolic BP 197 166 138 149 146 152 -  Diastolic BP 89 76 79 90 88 85 -  Wt. (Lbs) 181 197 198 199 200 201.6 189  BMI 27.52 29.95 30.11 30.26 30.41 30.65 28.74  Some encounter information is confidential and restricted. Go to Review Flowsheets activity to see all data.

## 2021-05-10 NOTE — Assessment & Plan Note (Signed)
Controlled, no change in medication John [name] is reminded of the importance of commitment to daily physical activity for 30 minutes or more, as able and the need to limit carbohydrate intake to 30 to 60 grams per meal to help with blood sugar control.   The need to take medication as prescribed, test blood sugar as directed, and to call between visits if there is a concern that blood sugar is uncontrolled is also discussed.   John [name] is reminded of the importance of daily foot exam, annual eye examination, and good blood sugar, blood pressure and cholesterol control. Well controlled continue management  Diabetic Labs Latest Ref Rng & Units 05/07/2021 01/08/2021 09/29/2020 09/18/2020 07/03/2020  HbA1c 4.8 - 5.6 % 6.0(H) - 8.9(A) - -  Microalbumin mg/dL - - - - -  Micro/Creat Ratio <30 mcg/mg creat - - - - -  Chol 100 - 199 mg/dL 308(M) 578 469(G) - -  HDL >39 mg/dL 52 43 43 - -  Calc LDL 0 - 99 mg/dL 295(M) 841(L) 244(W) - -  Triglycerides 0 - 149 mg/dL 102 725 366 - -  Creatinine 0.76 - 1.27 mg/dL 4.40 3.47 4.25 9.56 3.87   BP/Weight 05/07/2021 01/08/2021 11/17/2020 09/29/2020 09/21/2020 08/17/2020 07/06/2020  Systolic BP 197 166 138 149 146 152 -  Diastolic BP 89 76 79 90 88 85 -  Wt. (Lbs) 181 197 198 199 200 201.6 189  BMI 27.52 29.95 30.11 30.26 30.41 30.65 28.74  Some encounter information is confidential and restricted. Go to Review Flowsheets activity to see all data.   Foot/eye exam completion dates Latest Ref Rng & Units 07/09/2019 03/18/2019  Eye Exam No Retinopathy No Retinopathy -  Foot Form Completion - - Done

## 2021-05-10 NOTE — Assessment & Plan Note (Signed)
Uncontrolled add bedtime clonidine DASH diet and commitment to daily physical activity for a minimum of 30 minutes discussed and encouraged, as a part of hypertension management. The importance of attaining a healthy weight is also discussed.  BP/Weight 05/07/2021 01/08/2021 11/17/2020 09/29/2020 09/21/2020 08/17/2020 07/06/2020  Systolic BP 197 166 138 149 146 152 -  Diastolic BP 89 76 79 90 88 85 -  Wt. (Lbs) 181 197 198 199 200 201.6 189  BMI 27.52 29.95 30.11 30.26 30.41 30.65 28.74  Some encounter information is confidential and restricted. Go to Review Flowsheets activity to see all data.

## 2021-05-10 NOTE — Assessment & Plan Note (Signed)
Sleep hygiene reviewed and written information offered also. Managed by Psych

## 2021-05-10 NOTE — Assessment & Plan Note (Signed)
managed by psych, stable but increased anxiety with deterioration in spouse's health and reports no support from family, offered therapy but not interested currently

## 2021-06-01 ENCOUNTER — Encounter: Payer: Medicare Other | Admitting: Family Medicine

## 2021-06-09 ENCOUNTER — Ambulatory Visit (INDEPENDENT_AMBULATORY_CARE_PROVIDER_SITE_OTHER): Payer: Medicare Other | Admitting: *Deleted

## 2021-06-09 ENCOUNTER — Other Ambulatory Visit: Payer: Self-pay

## 2021-06-09 DIAGNOSIS — Z0000 Encounter for general adult medical examination without abnormal findings: Secondary | ICD-10-CM | POA: Diagnosis not present

## 2021-06-09 NOTE — Progress Notes (Signed)
Subjective:   John [name] is a 60 y.o. male who presents for Medicare Annual/Subsequent preventive examination.  I connected with  John [name] on 06/09/21 by a audio enabled telemedicine application and verified that I am speaking with the correct person using two identifiers.  Patient Location: Home  Provider Location: Office/Clinic  I discussed the limitations of evaluation and management by telemedicine. The patient expressed understanding and agreed to proceed.   Review of Systems     John [name] , Thank you for taking time to come for your Medicare Wellness Visit. I appreciate your ongoing commitment to your health goals. Please review the following plan we discussed and let me know if I can assist you in the future.   These are the goals we discussed:  Goals      Weight (lb) < 200 lb (90.7 kg)     Wants to lose 10lbs.        This is a list of the screening recommended for you and due dates:  Health Maintenance  Topic Date Due   Zoster (Shingles) Vaccine (1 of 2) Never done   COVID-19 Vaccine (2 - Booster for Janssen series) 12/28/2019   Eye exam for diabetics  07/08/2020   Tetanus Vaccine  06/15/2022*   Hemoglobin A1C  11/04/2021   Complete foot exam   11/17/2021   Colon Cancer Screening  09/22/2030   Pneumococcal Vaccination  Completed   Flu Shot  Completed   Hepatitis C Screening: USPSTF Recommendation to screen - Ages 18-79 yo.  Completed   HIV Screening  Completed   HPV Vaccine  Aged Out  *Topic was postponed. The date shown is not the original due date.          Objective:    There were no vitals filed for this visit. There is no height or weight on file to calculate BMI.  Advanced Directives 09/21/2020 05/28/2020 06/11/2019 06/10/2019 12/28/2016 09/18/2016 03/02/2016  Does Patient Have a Medical Advance Directive? No No No No No No No  Would patient like information on creating a medical advance directive? No - Patient declined No - Patient  declined No - Patient declined No - Patient declined No - Patient declined No - Patient declined No - patient declined information  Pre-existing out of facility DNR order (yellow form or pink MOST form) - - - - - - -  Some encounter information is confidential and restricted. Go to Review Flowsheets activity to see all data.    Current Medications (verified) Outpatient Encounter Medications as of 06/09/2021  Medication Sig   ACCU-CHEK SOFTCLIX LANCETS lancets 100 each by Other route 4 (four) times daily. Use as instructed   ALPRAZolam (XANAX) 1 MG tablet Take 1 tablet (1 mg total) by mouth 2 (two) times daily.   amLODipine (NORVASC) 10 MG tablet Take 1 tablet (10 mg total) by mouth daily.   atorvastatin (LIPITOR) 40 MG tablet Take 1 tablet (40 mg total) by mouth daily.   Blood Glucose Monitoring Suppl (ACCU-CHEK AVIVA) device 1 each by Other route 2 (two) times daily. Use as instructed bid. E11.65   cloNIDine (CATAPRES) 0.2 MG tablet Take 1 tablet (0.2 mg total) by mouth daily.   ezetimibe (ZETIA) 10 MG tablet Take 1 tablet (10 mg total) by mouth daily.   FLUoxetine (PROZAC) 40 MG capsule Take 1 capsule (40 mg total) by mouth daily.   glucose blood (ONETOUCH VERIO) test strip Use as instructed to test blood glucose twice daily.  ONE TOUCH VERIO FLEX test strips   insulin glargine (LANTUS) 100 UNIT/ML Solostar Pen Inject 70 Units into the skin at bedtime.   lisinopril-hydrochlorothiazide (ZESTORETIC) 20-25 MG tablet Take 1 tablet by mouth daily.   metFORMIN (GLUCOPHAGE) 500 MG tablet TAKE 1 TABLET BY MOUTH TWICE DAILY WITH A MEAL (Patient taking differently: Take 500 mg by mouth 2 (two) times daily with a meal. TAKE 1 TABLET BY MOUTH TWICE DAILY WITH A MEAL)   traZODone (DESYREL) 150 MG tablet Take 1 tablet (150 mg total) by mouth at bedtime.   No facility-administered encounter medications on file as of 06/09/2021.    Allergies (verified) Januvia [sitagliptin] and Poison oak extract [poison  oak extract]   History: Past Medical History:  Diagnosis Date   Anxiety    Depression 2007   Diabetes mellitus without complication (HCC)    Helicobacter pylori ab+ 01/21/2019   Dx in 12/2018, will treat   Hyperlipidemia    Hypertension    Hypogonadism male    Obesity (BMI 30.0-34.9) 03/01/2017   Past Surgical History:  Procedure Laterality Date   CIRCUMCISION N/A 06/10/2019   Procedure: CIRCUMCISION ADULT;  Surgeon: Malen Gauze, MD;  Location: [facility];  Service: Urology;  Laterality: N/A;  30 MINS   COLONOSCOPY  01/20/2011   Dr. Darrick Penna: large internal hemorrhoids   COLONOSCOPY WITH PROPOFOL N/A 09/21/2020   Procedure: COLONOSCOPY WITH PROPOFOL;  Surgeon: Lanelle Bal, DO;  Location: [facility];  Service: Endoscopy;  Laterality: N/A;  AM-diabetic   HERNIA REPAIR     ventral hernia repair    VASECTOMY     Family History  Problem Relation Age of Onset   Heart failure Mother        living    Hypertension Mother    Hyperlipidemia Mother    Heart disease Mother    Hypertension Sister    Hypertension Brother    Colon cancer Neg Hx    Social History   Socioeconomic History   Marital status: Married    Spouse name: Not on file   Number of children: 1   Years of education: Not on file   Highest education level: Not on file  Occupational History   Occupation: Dispensing optician  Tobacco Use   Smoking status: Never   Smokeless tobacco: Never  Vaping Use   Vaping Use: Never used  Substance and Sexual Activity   Alcohol use: Yes    Alcohol/week: 1.0 standard drink    Types: 1 Cans of beer per week    Comment: occasional beer socially.    Drug use: No   Sexual activity: Yes  Other Topics Concern   Not on file  Social History Narrative   Not on file   Social Determinants of Health   Financial Resource Strain: Not on file  Food Insecurity: Not on file  Transportation Needs: Not on file  Physical Activity: Not on file  Stress: Not on file  Social Connections:  Not on file    Tobacco Counseling Counseling given: Not Answered   Clinical Intake:                 Diabetic?Nutrition Risk Assessment:  Has the patient had any N/V/D within the last 2 months?  No  Does the patient have any non-healing wounds?  No  Has the patient had any unintentional weight loss or weight gain?  No   Diabetes:  Is the patient diabetic?  Yes  If diabetic, was a CBG  obtained today?  No  Did the patient bring in their glucometer from home?  No  How often do you monitor your CBG's? Every other day   Financial Strains and Diabetes Management:  Are you having any financial strains with the device, your supplies or your medication? No .  Does the patient want to be seen by Chronic Care Management for management of their diabetes?  No  Would the patient like to be referred to a Nutritionist or for Diabetic Management?  No   Diabetic Exams:  Diabetic Eye Exam: Overdue for diabetic eye exam. Pt has been advised about the importance in completing this exam. Patient advised to call and schedule an eye exam. Diabetic Foot Exam: Completed 11-17-20          Activities of Daily Living No flowsheet data found.  Patient Care Team: Kerri Perches, MD as PCP - General (Family Medicine) West Bali, MD (Inactive) (Gastroenterology) Lanelle Bal, DO as Consulting Physician (Internal Medicine)  Indicate any recent Medical Services you may have received from other than [facility] providers in the past year (date may be approximate).     Assessment:   This is a routine wellness examination for Aleksa.  Hearing/Vision screen No results found.  Dietary issues and exercise activities discussed:     Goals Addressed   None   Depression Screen PHQ 2/9 Scores 01/08/2021 06/22/2020 05/28/2020 02/12/2020 02/12/2020 08/15/2019 05/28/2019  PHQ - 2 Score 0 0 1 1 0 3 0  PHQ- 9 Score - - - - - 9 -  Some encounter information is confidential and restricted. Go to  Review Flowsheets activity to see all data.    Fall Risk Fall Risk  05/07/2021 01/08/2021 01/08/2021 09/29/2020 06/22/2020  Falls in the past year? 1 0 0 0 0  Number falls in past yr: 0 - 0 0 0  Injury with Fall? 0 - 0 0 0  Risk for fall due to : - - - - -  Follow up - - - - -    FALL RISK PREVENTION PERTAINING TO THE HOME:  Any stairs in or around the home? Yes  If so, are there any without handrails? Yes  Home free of loose throw rugs in walkways, pet beds, electrical cords, etc? Yes  Adequate lighting in your home to reduce risk of falls? Yes   ASSISTIVE DEVICES UTILIZED TO PREVENT FALLS:  Life alert? No  Use of a cane, walker or w/c? No  Grab bars in the bathroom? Yes  Shower chair or bench in shower? Yes  Elevated toilet seat or a handicapped toilet? Yes     Cognitive Function:     6CIT Screen 05/28/2020 05/28/2020 05/28/2019  What Year? 0 points 0 points 0 points  What month? 0 points 0 points 0 points  What time? 0 points - 0 points  Count back from 20 0 points - 0 points  Months in reverse - - 4 points  Repeat phrase - - 10 points  Total Score - - 14    Immunizations Immunization History  Administered Date(s) Administered   Influenza Whole 03/18/2010, 04/13/2011   Influenza,inj,Quad PF,6+ Mos 05/20/2013, 05/08/2014, 06/11/2015, 04/18/2017, 05/23/2018, 03/18/2019, 02/12/2020, 05/07/2021   Janssen (J&J) SARS-COV-2 Vaccination 11/02/2019   PNEUMOCOCCAL CONJUGATE-20 05/07/2021   Pneumococcal Polysaccharide-23 11/26/2013, 03/18/2019   Td 09/15/2009    TDAP status: Up to date  Flu Vaccine status: Up to date  Pneumococcal vaccine status: Up to date  Covid-19 vaccine status: Completed  vaccines  Qualifies for Shingles Vaccine? Yes   Zostavax completed No   Shingrix Completed?: No.    Education has been provided regarding the importance of this vaccine. Patient has been advised to call insurance company to determine out of pocket expense if they have not yet  received this vaccine. Advised may also receive vaccine at local pharmacy or Health Dept. Verbalized acceptance and understanding.  Screening Tests Health Maintenance  Topic Date Due   Zoster Vaccines- Shingrix (1 of 2) Never done   COVID-19 Vaccine (2 - Booster for [employer] series) 12/28/2019   OPHTHALMOLOGY EXAM  07/08/2020   TETANUS/TDAP  06/15/2022 (Originally 09/16/2019)   HEMOGLOBIN A1C  11/04/2021   FOOT EXAM  11/17/2021   COLONOSCOPY (Pts 45-53yrs Insurance coverage will need to be confirmed)  09/22/2030   Pneumococcal Vaccine 35-55 Years old  Completed   INFLUENZA VACCINE  Completed   Hepatitis C Screening  Completed   HIV Screening  Completed   HPV VACCINES  Aged Out    Health Maintenance  Health Maintenance Due  Topic Date Due   Zoster Vaccines- Shingrix (1 of 2) Never done   COVID-19 Vaccine (2 - Booster for Janssen series) 12/28/2019   OPHTHALMOLOGY EXAM  07/08/2020    Colorectal cancer screening: Type of screening: Colonoscopy. Completed 09-21-20. Repeat every 10 years  Lung Cancer Screening: (Low Dose CT Chest recommended if Age 53-80 years, 30 pack-year currently smoking OR have quit w/in 15years.) does not qualify.    Additional Screening:  Hepatitis C Screening: does qualify; Completed 10-22-13  Vision Screening: Recommended annual ophthalmology exams for early detection of glaucoma and other disorders of the eye. Is the patient up to date with their annual eye exam?  Yes  Who is the provider or what is the name of the office in which the patient attends annual eye exams? Will do screening at office If pt is not established with a provider, would they like to be referred to a provider to establish care? No .   Dental Screening: Recommended annual dental exams for proper oral hygiene  Community Resource Referral / Chronic Care Management: CRR required this visit?  No   CCM required this visit?  No      Plan:     I have personally reviewed and noted the  following in the patients chart:   Medical and social history Use of alcohol, tobacco or illicit drugs  Current medications and supplements including opioid prescriptions. Patient is not currently taking opioid prescriptions. Functional ability and status Nutritional status Physical activity Advanced directives List of other physicians Hospitalizations, surgeries, and ER visits in previous 12 months Vitals Screenings to include cognitive, depression, and falls Referrals and appointments  In addition, I have reviewed and discussed with patient certain preventive protocols, quality metrics, and best practice recommendations. A written personalized care plan for preventive services as well as general preventive health recommendations were provided to patient.     Park Breed, CMA   06/09/2021   Nurse Notes:  Mr. Bethea , Thank you for taking time to come for your Medicare Wellness Visit. I appreciate your ongoing commitment to your health goals. Please review the following plan we discussed and let me know if I can assist you in the future.   These are the goals we discussed:  Goals      Weight (lb) < 200 lb (90.7 kg)     Wants to lose 10lbs.        This is  a list of the screening recommended for you and due dates:  Health Maintenance  Topic Date Due   Zoster (Shingles) Vaccine (1 of 2) Never done   COVID-19 Vaccine (2 - Booster for Janssen series) 12/28/2019   Eye exam for diabetics  07/08/2020   Tetanus Vaccine  06/15/2022*   Hemoglobin A1C  11/04/2021   Complete foot exam   11/17/2021   Colon Cancer Screening  09/22/2030   Pneumococcal Vaccination  Completed   Flu Shot  Completed   Hepatitis C Screening: USPSTF Recommendation to screen - Ages 18-79 yo.  Completed   HIV Screening  Completed   HPV Vaccine  Aged Out  *Topic was postponed. The date shown is not the original due date.

## 2021-06-09 NOTE — Patient Instructions (Signed)
John [name] , Thank you for taking time to come for your Medicare Wellness Visit. I appreciate your ongoing commitment to your health goals. Please review the following plan we discussed and let me know if I can assist you in the future.   Screening recommendations/referrals: Colonoscopy: Completed Recommended yearly ophthalmology/optometry visit for glaucoma screening and checkup Recommended yearly dental visit for hygiene and checkup  Vaccinations: Influenza vaccine: completed Pneumococcal vaccine: completed Tdap vaccine: completed Shingles vaccine: due now    Advanced directives: information provided  Conditions/risks identified: hypertension, diabetes  Next appointment: 1 year  Preventive Care 40-64 Years, Male Preventive care refers to lifestyle choices and visits with your health care provider that can promote health and wellness. What does preventive care include? A yearly physical exam. This is also called an annual well check. Dental exams once or twice a year. Routine eye exams. Ask your health care provider how often you should have your eyes checked. Personal lifestyle choices, including: Daily care of your teeth and gums. Regular physical activity. Eating a healthy diet. Avoiding tobacco and drug use. Limiting alcohol use. Practicing safe sex. Taking low-dose aspirin every day starting at age 80. What happens during an annual well check? The services and screenings done by your health care provider during your annual well check will depend on your age, overall health, lifestyle risk factors, and family history of disease. Counseling  Your health care provider may ask you questions about your: Alcohol use. Tobacco use. Drug use. Emotional well-being. Home and relationship well-being. Sexual activity. Eating habits. Work and work Astronomer. Screening  You may have the following tests or measurements: Height, weight, and BMI. Blood pressure. Lipid and  cholesterol levels. These may be checked every 5 years, or more frequently if you are over 78 years old. Skin check. Lung cancer screening. You may have this screening every year starting at age 65 if you have a 30-pack-year history of smoking and currently smoke or have quit within the past 15 years. Fecal occult blood test (FOBT) of the stool. You may have this test every year starting at age 54. Flexible sigmoidoscopy or colonoscopy. You may have a sigmoidoscopy every 5 years or a colonoscopy every 10 years starting at age 44. Prostate cancer screening. Recommendations will vary depending on your family history and other risks. Hepatitis C blood test. Hepatitis B blood test. Sexually transmitted disease (STD) testing. Diabetes screening. This is done by checking your blood sugar (glucose) after you have not eaten for a while (fasting). You may have this done every 1-3 years. Discuss your test results, treatment options, and if necessary, the need for more tests with your health care provider. Vaccines  Your health care provider may recommend certain vaccines, such as: Influenza vaccine. This is recommended every year. Tetanus, diphtheria, and acellular pertussis (Tdap, Td) vaccine. You may need a Td booster every 10 years. Zoster vaccine. You may need this after age 52. Pneumococcal 13-valent conjugate (PCV13) vaccine. You may need this if you have certain conditions and have not been vaccinated. Pneumococcal polysaccharide (PPSV23) vaccine. You may need one or two doses if you smoke cigarettes or if you have certain conditions. Talk to your health care provider about which screenings and vaccines you need and how often you need them. This information is not intended to replace advice given to you by your health care provider. Make sure you discuss any questions you have with your health care provider. Document Released: 07/03/2015 Document Revised: 02/24/2016 Document Reviewed:  04/07/2015 Elsevier Interactive Patient Education  2017 [employer]  Fall Prevention in the Home Falls can cause injuries. They can happen to people of all ages. There are many things you can do to make your home safe and to help prevent falls. What can I do on the outside of my home? Regularly fix the edges of walkways and driveways and fix any cracks. Remove anything that might make you trip as you walk through a door, such as a raised step or threshold. Trim any bushes or trees on the path to your home. Use bright outdoor lighting. Clear any walking paths of anything that might make someone trip, such as rocks or tools. Regularly check to see if handrails are loose or broken. Make sure that both sides of any steps have handrails. Any raised decks and porches should have guardrails on the edges. Have any leaves, snow, or ice cleared regularly. Use sand or salt on walking paths during winter. Clean up any spills in your garage right away. This includes oil or grease spills. What can I do in the bathroom? Use night lights. Install grab bars by the toilet and in the tub and shower. Do not use towel bars as grab bars. Use non-skid mats or decals in the tub or shower. If you need to sit down in the shower, use a plastic, non-slip stool. Keep the floor dry. Clean up any water that spills on the floor as soon as it happens. Remove soap buildup in the tub or shower regularly. Attach bath mats securely with double-sided non-slip rug tape. Do not have throw rugs and other things on the floor that can make you trip. What can I do in the bedroom? Use night lights. Make sure that you have a light by your bed that is easy to reach. Do not use any sheets or blankets that are too big for your bed. They should not hang down onto the floor. Have a firm chair that has side arms. You can use this for support while you get dressed. Do not have throw rugs and other things on the floor that can make you  trip. What can I do in the kitchen? Clean up any spills right away. Avoid walking on wet floors. Keep items that you use a lot in easy-to-reach places. If you need to reach something above you, use a strong step stool that has a grab bar. Keep electrical cords out of the way. Do not use floor polish or wax that makes floors slippery. If you must use wax, use non-skid floor wax. Do not have throw rugs and other things on the floor that can make you trip. What can I do with my stairs? Do not leave any items on the stairs. Make sure that there are handrails on both sides of the stairs and use them. Fix handrails that are broken or loose. Make sure that handrails are as long as the stairways. Check any carpeting to make sure that it is firmly attached to the stairs. Fix any carpet that is loose or worn. Avoid having throw rugs at the top or bottom of the stairs. If you do have throw rugs, attach them to the floor with carpet tape. Make sure that you have a light switch at the top of the stairs and the bottom of the stairs. If you do not have them, ask someone to add them for you. What else can I do to help prevent falls? Wear shoes that: Do not have  high heels. Have rubber bottoms. Are comfortable and fit you well. Are closed at the toe. Do not wear sandals. If you use a stepladder: Make sure that it is fully opened. Do not climb a closed stepladder. Make sure that both sides of the stepladder are locked into place. Ask someone to hold it for you, if possible. Clearly mark and make sure that you can see: Any grab bars or handrails. First and last steps. Where the edge of each step is. Use tools that help you move around (mobility aids) if they are needed. These include: Canes. Walkers. Scooters. Crutches. Turn on the lights when you go into a dark area. Replace any light bulbs as soon as they burn out. Set up your furniture so you have a clear path. Avoid moving your furniture  around. If any of your floors are uneven, fix them. If there are any pets around you, be aware of where they are. Review your medicines with your doctor. Some medicines can make you feel dizzy. This can increase your chance of falling. Ask your doctor what other things that you can do to help prevent falls. This information is not intended to replace advice given to you by your health care provider. Make sure you discuss any questions you have with your health care provider. Document Released: 04/02/2009 Document Revised: 11/12/2015 Document Reviewed: 07/11/2014 Elsevier Interactive Patient Education  2017 [employer].

## 2021-06-17 ENCOUNTER — Other Ambulatory Visit: Payer: Self-pay

## 2021-06-17 ENCOUNTER — Ambulatory Visit: Payer: Medicare Other

## 2021-06-17 LAB — HM DIABETES EYE EXAM

## 2021-07-14 ENCOUNTER — Ambulatory Visit (INDEPENDENT_AMBULATORY_CARE_PROVIDER_SITE_OTHER): Payer: Medicare Other | Admitting: Family Medicine

## 2021-07-14 ENCOUNTER — Other Ambulatory Visit: Payer: Self-pay

## 2021-07-14 ENCOUNTER — Encounter: Payer: Self-pay | Admitting: Family Medicine

## 2021-07-14 VITALS — BP 150/82 | HR 93 | Ht 68.0 in | Wt 176.0 lb

## 2021-07-14 DIAGNOSIS — E1169 Type 2 diabetes mellitus with other specified complication: Secondary | ICD-10-CM

## 2021-07-14 DIAGNOSIS — E663 Overweight: Secondary | ICD-10-CM

## 2021-07-14 DIAGNOSIS — E669 Obesity, unspecified: Secondary | ICD-10-CM

## 2021-07-14 DIAGNOSIS — I10 Essential (primary) hypertension: Secondary | ICD-10-CM

## 2021-07-14 DIAGNOSIS — F324 Major depressive disorder, single episode, in partial remission: Secondary | ICD-10-CM

## 2021-07-14 DIAGNOSIS — E782 Mixed hyperlipidemia: Secondary | ICD-10-CM | POA: Diagnosis not present

## 2021-07-14 MED ORDER — ROSUVASTATIN CALCIUM 20 MG PO TABS: 20.0000 mg | ORAL_TABLET | Freq: Every day | ORAL | 3 refills | Status: DC

## 2021-07-14 MED ORDER — LISINOPRIL-HYDROCHLOROTHIAZIDE 20-25 MG PO TABS: 1.0000 | ORAL_TABLET | Freq: Every day | ORAL | 3 refills | Status: DC

## 2021-07-14 MED ORDER — AMLODIPINE BESYLATE 5 MG PO TABS: 5.0000 mg | ORAL_TABLET | Freq: Every day | ORAL | 3 refills | Status: DC

## 2021-07-14 NOTE — Patient Instructions (Signed)
F/U early March, call if you need me sooner  Fasting lipid, cmp and eGFr 3 days before visit  Medications are as listed  Zestoretic is sent for blood pressure  Take metformin one twice daily  Rosuvastatin is sent for cholesterol  Thanks for choosing [facility], we consider it a privelige to serve you.

## 2021-07-18 ENCOUNTER — Encounter: Payer: Self-pay | Admitting: Family Medicine

## 2021-07-18 NOTE — Assessment & Plan Note (Signed)
Hyperlipidemia:Low fat diet discussed and encouraged.   Lipid Panel  Lab Results  Component Value Date   CHOL 205 (H) 05/07/2021   HDL 52 05/07/2021   LDLCALC 131 (H) 05/07/2021   TRIG 123 05/07/2021   CHOLHDL 3.9 05/07/2021     Uncontrolled , med sent

## 2021-07-18 NOTE — Assessment & Plan Note (Signed)
Managed by Psych and controlled 

## 2021-07-18 NOTE — Assessment & Plan Note (Signed)
Uncontrolled, med sent DASH diet and commitment to daily physical activity for a minimum of 30 minutes discussed and encouraged, as a part of hypertension management. The importance of attaining a healthy weight is also discussed.  BP/Weight 07/14/2021 05/07/2021 01/08/2021 11/17/2020 09/29/2020 09/21/2020 08/17/2020  Systolic BP 150 197 166 138 149 146 152  Diastolic BP 82 89 76 79 90 88 85  Wt. (Lbs) 176.04 181 197 198 199 200 201.6  BMI 26.77 27.52 29.95 30.11 30.26 30.41 30.65  Some encounter information is confidential and restricted. Go to Review Flowsheets activity to see all data.
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
## 2021-07-18 NOTE — Assessment & Plan Note (Signed)
John [name] is reminded of the importance of commitment to daily physical activity for 30 minutes or more, as able and the need to limit carbohydrate intake to 30 to 60 grams per meal to help with blood sugar control.   The need to take medication as prescribed, test blood sugar as directed, and to call between visits if there is a concern that blood sugar is uncontrolled is also discussed.   John [name] is reminded of the importance of daily foot exam, annual eye examination, and good blood sugar, blood pressure and cholesterol control. Controlled, no change in medication Updated lab needed at/ before next visit.   Diabetic Labs Latest Ref Rng & Units 05/07/2021 01/08/2021 09/29/2020 09/18/2020 07/03/2020  HbA1c 4.8 - 5.6 % 6.0(H) - 8.9(A) - -  Microalbumin mg/dL - - - - -  Micro/Creat Ratio <30 mcg/mg creat - - - - -  Chol 100 - 199 mg/dL 754(G) 920 100(F) - -  HDL >39 mg/dL 52 43 43 - -  Calc LDL 0 - 99 mg/dL 121(F) 758(I) 325(Q) - -  Triglycerides 0 - 149 mg/dL 982 641 583 - -  Creatinine 0.76 - 1.27 mg/dL 0.94 0.76 8.08 8.11 0.31   BP/Weight 07/14/2021 05/07/2021 01/08/2021 11/17/2020 09/29/2020 09/21/2020 08/17/2020  Systolic BP 150 197 166 138 149 146 152  Diastolic BP 82 89 76 79 90 88 85  Wt. (Lbs) 176.04 181 197 198 199 200 201.6  BMI 26.77 27.52 29.95 30.11 30.26 30.41 30.65  Some encounter information is confidential and restricted. Go to Review Flowsheets activity to see all data.   Foot/eye exam completion dates Latest Ref Rng & Units 06/17/2021 07/09/2019  Eye Exam No Retinopathy No Retinopathy No Retinopathy  Foot Form Completion - - -

## 2021-07-18 NOTE — Assessment & Plan Note (Signed)
°  Patient re-educated about  the importance of commitment to a  minimum of 150 minutes of exercise per week as able.  The importance of healthy food choices with portion control discussed, as well as eating regularly and within a 12 hour window most days. The need to choose "clean , green" food 50 to 75% of the time is discussed, as well as to make water the primary drink and set a goal of 64 ounces water daily.    Weight /BMI 07/14/2021 05/07/2021 01/08/2021  WEIGHT 176 lb 0.6 oz 181 lb 197 lb  HEIGHT 5\' 8"  5\' 8"  5\' 8"   BMI 26.77 kg/m2 27.52 kg/m2 29.95 kg/m2  Some encounter information is confidential and restricted. Go to Review Flowsheets activity to see all data.

## 2021-07-21 ENCOUNTER — Ambulatory Visit
Admission: EM | Admit: 2021-07-21 | Discharge: 2021-07-21 | Disposition: A | Discharge status: Home / Self Care | Payer: Medicare Other | Attending: Urgent Care | Admitting: Urgent Care

## 2021-07-21 ENCOUNTER — Encounter: Payer: Self-pay | Admitting: Emergency Medicine

## 2021-07-21 ENCOUNTER — Other Ambulatory Visit: Payer: Self-pay

## 2021-07-21 DIAGNOSIS — E119 Type 2 diabetes mellitus without complications: Secondary | ICD-10-CM | POA: Diagnosis not present

## 2021-07-21 DIAGNOSIS — B354 Tinea corporis: Secondary | ICD-10-CM

## 2021-07-21 DIAGNOSIS — B353 Tinea pedis: Secondary | ICD-10-CM | POA: Diagnosis not present

## 2021-07-21 DIAGNOSIS — Z794 Long term (current) use of insulin: Secondary | ICD-10-CM

## 2021-07-21 DIAGNOSIS — L299 Pruritus, unspecified: Secondary | ICD-10-CM

## 2021-07-21 MED ORDER — FLUCONAZOLE 150 MG PO TABS: 150.0000 mg | ORAL_TABLET | ORAL | 0 refills | Status: DC

## 2021-07-21 MED ORDER — HYDROXYZINE HCL 25 MG PO TABS
12.5000 mg | ORAL_TABLET | Freq: Three times a day (TID) | ORAL | 0 refills | Status: DC | PRN
Start: 2021-07-21 — End: 2022-01-11

## 2021-07-21 NOTE — ED Provider Notes (Signed)
[facility]   MRN: [id number] DOB: 1961-01-29  Subjective:   John [name] is a 61 y.o. male presenting for 2-week history of itching over his back and toes of his right foot.  Patient has had dark discoloration over the areas.  Denies any pain, drainage of pus or bleeding.  He has been scratching consistently.  He does have type 2 diabetes treated with insulin.  Has good control.  Last A1c was in November 2022 and was 6%.  No current facility-administered medications for this encounter.  Current Outpatient Medications:    ACCU-CHEK SOFTCLIX LANCETS lancets, 100 each by Other route 4 (four) times daily. Use as instructed, Disp: 100 each, Rfl: 5   ALPRAZolam (XANAX) 1 MG tablet, Take 1 tablet (1 mg total) by mouth 2 (two) times daily., Disp: 60 tablet, Rfl: 2   amLODipine (NORVASC) 10 MG tablet, Take 1 tablet (10 mg total) by mouth daily., Disp: 30 tablet, Rfl: 5   amLODipine (NORVASC) 5 MG tablet, Take 1 tablet (5 mg total) by mouth daily., Disp: 30 tablet, Rfl: 3   Blood Glucose Monitoring Suppl (ACCU-CHEK AVIVA) device, 1 each by Other route 2 (two) times daily. Use as instructed bid. E11.65, Disp: 1 each, Rfl: 0   cloNIDine (CATAPRES) 0.2 MG tablet, Take 1 tablet (0.2 mg total) by mouth daily., Disp: 30 tablet, Rfl: 3   FLUoxetine (PROZAC) 40 MG capsule, Take 1 capsule (40 mg total) by mouth daily., Disp: 30 capsule, Rfl: 2   glucose blood (ONETOUCH VERIO) test strip, Use as instructed to test blood glucose twice daily. ONE TOUCH VERIO FLEX test strips, Disp: 100 each, Rfl: 12   lisinopril-hydrochlorothiazide (ZESTORETIC) 20-25 MG tablet, Take 1 tablet by mouth daily., Disp: 30 tablet, Rfl: 3   metFORMIN (GLUCOPHAGE) 500 MG tablet, TAKE 1 TABLET BY MOUTH TWICE DAILY WITH A MEAL (Patient taking differently: Take 500 mg by mouth 2 (two) times daily with a meal. TAKE 1 TABLET BY MOUTH TWICE DAILY WITH A MEAL), Disp: 180 tablet, Rfl: 3   rosuvastatin (CRESTOR) 20 MG tablet,  Take 1 tablet (20 mg total) by mouth daily., Disp: 30 tablet, Rfl: 3   traZODone (DESYREL) 150 MG tablet, Take 1 tablet (150 mg total) by mouth at bedtime., Disp: 30 tablet, Rfl: 2   Allergies  Allergen Reactions   Januvia [Sitagliptin] Nausea And Vomiting   Poison Oak Extract [Poison Oak Extract] Dermatitis    Past Medical History:  Diagnosis Date   Anxiety    Depression 2007   Diabetes mellitus without complication (HCC)    Helicobacter pylori ab+ 01/21/2019   Dx in 12/2018, will treat   Hyperlipidemia    Hypertension    Hypogonadism male    Obesity (BMI 30.0-34.9) 03/01/2017     Past Surgical History:  Procedure Laterality Date   CIRCUMCISION N/A 06/10/2019   Procedure: CIRCUMCISION ADULT;  Surgeon: Malen Gauze, MD;  Location: [facility];  Service: Urology;  Laterality: N/A;  30 MINS   COLONOSCOPY  01/20/2011   Dr. Darrick Penna: large internal hemorrhoids   COLONOSCOPY WITH PROPOFOL N/A 09/21/2020   Procedure: COLONOSCOPY WITH PROPOFOL;  Surgeon: Lanelle Bal, DO;  Location: [facility];  Service: Endoscopy;  Laterality: N/A;  AM-diabetic   HERNIA REPAIR     ventral hernia repair    VASECTOMY      Family History  Problem Relation Age of Onset   Heart failure Mother        living  Hypertension Mother    Hyperlipidemia Mother    Heart disease Mother    Hypertension Sister    Hypertension Brother    Colon cancer Neg Hx     Social History   Tobacco Use   Smoking status: Never   Smokeless tobacco: Never  Vaping Use   Vaping Use: Never used  Substance Use Topics   Alcohol use: Yes    Alcohol/week: 1.0 standard drink    Types: 1 Cans of beer per week    Comment: occasional beer socially.    Drug use: No    ROS   Objective:   Vitals: BP (!) 171/95 (BP Location: Right Arm)    Pulse 75    Temp 98.5 F (36.9 C) (Oral)    Resp 18    Ht 5\' 8"  (1.727 m)    Wt 176 lb (79.8 kg)    SpO2 98%    BMI 26.76 kg/m   Physical Exam Constitutional:      General: He  is not in acute distress.    Appearance: Normal appearance. He is well-developed and normal weight. He is not ill-appearing, toxic-appearing or diaphoretic.  HENT:     Head: Normocephalic and atraumatic.     Right Ear: External ear normal.     Left Ear: External ear normal.     Nose: Nose normal.     Mouth/Throat:     Pharynx: Oropharynx is clear.  Eyes:     General: No scleral icterus.       Right eye: No discharge.        Left eye: No discharge.     Extraocular Movements: Extraocular movements intact.  Cardiovascular:     Rate and Rhythm: Normal rate.  Pulmonary:     Effort: Pulmonary effort is normal.  Musculoskeletal:     Cervical back: Normal range of motion.  Skin:    General: Skin is warm and dry.     Comments: Hyperpigmented annular lesions over the mid upper left back.  Hyperpigmentation and slight swelling over the cuticles extending into the skin bordering the nails worse over the right great toe.  Neurological:     Mental Status: He is alert and oriented to person, place, and time.  Psychiatric:        Mood and Affect: Mood normal.        Behavior: Behavior normal.        Thought Content: Thought content normal.        Judgment: Judgment normal.      Assessment and Plan :   PDMP not reviewed this encounter.  1. Tinea corporis   2. Type 2 diabetes mellitus treated with insulin (HCC)   3. Tinea pedis of right foot   4. Itching    We will manage for fungal infection with fluconazole, hydroxyzine for itching. Counseled patient on potential for adverse effects with medications prescribed/recommended today, ER and return-to-clinic precautions discussed, patient verbalized understanding.    , Wallis Bamberg 07/21/21 09/18/21

## 2021-07-21 NOTE — ED Triage Notes (Addendum)
Pt reports itching sensation to back, right hand.   Pt also reports bilateral hand numbness/color change to skin after "trying to push skin away from nail bed."  X2 weeks.

## 2021-07-23 ENCOUNTER — Other Ambulatory Visit (HOSPITAL_COMMUNITY): Payer: Self-pay | Admitting: Psychiatry

## 2021-07-23 DIAGNOSIS — F322 Major depressive disorder, single episode, severe without psychotic features: Secondary | ICD-10-CM

## 2021-07-26 NOTE — Telephone Encounter (Signed)
Call for appt

## 2021-08-11 ENCOUNTER — Other Ambulatory Visit: Payer: Self-pay

## 2021-08-11 ENCOUNTER — Encounter (HOSPITAL_COMMUNITY): Payer: Self-pay | Admitting: Psychiatry

## 2021-08-11 ENCOUNTER — Telehealth (INDEPENDENT_AMBULATORY_CARE_PROVIDER_SITE_OTHER): Payer: Medicare Other | Admitting: Psychiatry

## 2021-08-11 DIAGNOSIS — F322 Major depressive disorder, single episode, severe without psychotic features: Secondary | ICD-10-CM | POA: Diagnosis not present

## 2021-08-11 MED ORDER — ALPRAZOLAM 1 MG PO TABS: 1.0000 mg | ORAL_TABLET | Freq: Two times a day (BID) | ORAL | 0 refills | Status: DC

## 2021-08-11 MED ORDER — TRAZODONE HCL 150 MG PO TABS: 150.0000 mg | ORAL_TABLET | Freq: Every day | ORAL | 2 refills | Status: DC

## 2021-08-11 NOTE — Progress Notes (Signed)
Virtual Visit via Telephone Note  I connected with John [name] on 08/11/21 at 11:20 AM EST by telephone and verified that I am speaking with the correct person using two identifiers.  Location: Patient: home Provider: office   I discussed the limitations, risks, security and privacy concerns of performing an evaluation and management service by telephone and the availability of in person appointments. I also discussed with the patient that there may be a patient responsible charge related to this service. The patient expressed understanding and agreed to proceed.      I discussed the assessment and treatment plan with the patient. The patient was provided an opportunity to ask questions and all were answered. The patient agreed with the plan and demonstrated an understanding of the instructions.   The patient was advised to call back or seek an in-person evaluation if the symptoms worsen or if the condition fails to improve as anticipated.  I provided 12 minutes of non-face-to-face time during this encounter.   John Ruder, MD  [facility] MD/PA/NP OP Progress Note  08/11/2021 11:32 AM John [name]  MRN:  [id number]  Chief Complaint:  Chief Complaint  Patient presents with   Depression   Anxiety   Follow-up   HPI: This patient is a 61 year old separated black male who is living with his nephew in [place].  He had 1 son who died at age 3 in 45 and a motor vehicle accident.  He is on disability.  The patient returns for follow-up after about 6 months.  He states that he separated from his wife and did not "want to talk about the reasons."  He states that just did not work out.  He still helps her when she needs help with things.  He is trying to stay busy and active.  He had stopped taking the Prozac because it made him feel strange and states that he is doing fine without it is not depressed.  He is using the Xanax because "it makes me feel calm."  He is sleeping well and the  trazodone helps although he does not use it every night.  He denies significant anxiety or depression thoughts of self-harm or suicidal ideation Visit Diagnosis:    ICD-10-CM   1. Major depressive disorder, single episode, severe without psychotic features (HCC)  F32.2 ALPRAZolam (XANAX) 1 MG tablet      Past Psychiatric History: none  Past Medical History:  Past Medical History:  Diagnosis Date   Anxiety    Depression 2007   Diabetes mellitus without complication (HCC)    Helicobacter pylori ab+ 01/21/2019   Dx in 12/2018, will treat   Hyperlipidemia    Hypertension    Hypogonadism male    Obesity (BMI 30.0-34.9) 03/01/2017    Past Surgical History:  Procedure Laterality Date   CIRCUMCISION N/A 06/10/2019   Procedure: CIRCUMCISION ADULT;  Surgeon: Malen Gauze, MD;  Location: [facility];  Service: Urology;  Laterality: N/A;  30 MINS   COLONOSCOPY  01/20/2011   Dr. Darrick Penna: large internal hemorrhoids   COLONOSCOPY WITH PROPOFOL N/A 09/21/2020   Procedure: COLONOSCOPY WITH PROPOFOL;  Surgeon: Lanelle Bal, DO;  [facility];  Service: Endoscopy;  Laterality: N/A;  AM-diabetic   HERNIA REPAIR     ventral hernia repair    VASECTOMY      Family Psychiatric History: see below  Family History:  Family History  Problem Relation Age of Onset   Heart failure Mother  living    Hypertension Mother    Hyperlipidemia Mother    Heart disease Mother    Hypertension Sister    Hypertension Brother    Colon cancer Neg Hx     Social History:  Social History   Socioeconomic History   Marital status: Married    Spouse name: Not on file   Number of children: 1   Years of education: Not on file   Highest education level: Not on file  Occupational History   Occupation: Dispensing optician  Tobacco Use   Smoking status: Never   Smokeless tobacco: Never  Vaping Use   Vaping Use: Never used  Substance and Sexual Activity   Alcohol use: Yes    Alcohol/week: 1.0  standard drink    Types: 1 Cans of beer per week    Comment: occasional beer socially.    Drug use: No   Sexual activity: Yes  Other Topics Concern   Not on file  Social History Narrative   Not on file   Social Determinants of Health   Financial Resource Strain: Low Risk    Difficulty of Paying Living Expenses: Not hard at all  Food Insecurity: No Food Insecurity   Worried About Programme researcher, broadcasting/film/video in the Last Year: Never true   Ran Out of Food in the Last Year: Never true  Transportation Needs: No Transportation Needs   Lack of Transportation (Medical): No   Lack of Transportation (Non-Medical): No  Physical Activity: Sufficiently Active   Days of Exercise per Week: 7 days   Minutes of Exercise per Session: 40 min  Stress: No Stress Concern Present   Feeling of Stress : Not at all  Social Connections: Moderately Integrated   Frequency of Communication with Friends and Family: More than three times a week   Frequency of Social Gatherings with Friends and Family: More than three times a week   Attends Religious Services: More than 4 times per year   Active Member of [employer] or Organizations: No   Attends Banker Meetings: Never   Marital Status: Married    Allergies:  Allergies  Allergen Reactions   Januvia [Sitagliptin] Nausea And Vomiting   Poison Oak Extract [Poison Oak Extract] Dermatitis    Metabolic Disorder Labs: Lab Results  Component Value Date   HGBA1C 6.0 (H) 05/07/2021   MPG 114.02 06/10/2019   MPG 140 02/26/2019   No results found for: PROLACTIN Lab Results  Component Value Date   CHOL 205 (H) 05/07/2021   TRIG 123 05/07/2021   HDL 52 05/07/2021   CHOLHDL 3.9 05/07/2021   VLDL 64 (H) 12/30/2016   LDLCALC 131 (H) 05/07/2021   LDLCALC 104 (H) 01/08/2021   Lab Results  Component Value Date   TSH 1.850 07/03/2020   TSH 2.800 07/01/2020    Therapeutic Level Labs: No results found for: LITHIUM No results found for: VALPROATE No  components found for:  CBMZ  Current Medications: Current Outpatient Medications  Medication Sig Dispense Refill   ACCU-CHEK SOFTCLIX LANCETS lancets 100 each by Other route 4 (four) times daily. Use as instructed 100 each 5   ALPRAZolam (XANAX) 1 MG tablet Take 1 tablet (1 mg total) by mouth 2 (two) times daily. 60 tablet 0   amLODipine (NORVASC) 10 MG tablet Take 1 tablet (10 mg total) by mouth daily. 30 tablet 5   amLODipine (NORVASC) 5 MG tablet Take 1 tablet (5 mg total) by mouth daily. 30 tablet 3  Blood Glucose Monitoring Suppl (ACCU-CHEK AVIVA) device 1 each by Other route 2 (two) times daily. Use as instructed bid. E11.65 1 each 0   cloNIDine (CATAPRES) 0.2 MG tablet Take 1 tablet (0.2 mg total) by mouth daily. 30 tablet 3   fluconazole (DIFLUCAN) 150 MG tablet Take 1 tablet (150 mg total) by mouth once a week. 4 tablet 0   glucose blood (ONETOUCH VERIO) test strip Use as instructed to test blood glucose twice daily. ONE TOUCH VERIO FLEX test strips 100 each 12   hydrOXYzine (ATARAX) 25 MG tablet Take 0.5-1 tablets (12.5-25 mg total) by mouth every 8 (eight) hours as needed for itching. 30 tablet 0   lisinopril-hydrochlorothiazide (ZESTORETIC) 20-25 MG tablet Take 1 tablet by mouth daily. 30 tablet 3   metFORMIN (GLUCOPHAGE) 500 MG tablet TAKE 1 TABLET BY MOUTH TWICE DAILY WITH A MEAL (Patient taking differently: Take 500 mg by mouth 2 (two) times daily with a meal. TAKE 1 TABLET BY MOUTH TWICE DAILY WITH A MEAL) 180 tablet 3   rosuvastatin (CRESTOR) 20 MG tablet Take 1 tablet (20 mg total) by mouth daily. 30 tablet 3   traZODone (DESYREL) 150 MG tablet Take 1 tablet (150 mg total) by mouth at bedtime. 30 tablet 2   No current facility-administered medications for this visit.     Musculoskeletal: Strength & Muscle Tone: na Gait & Station: na Patient leans: N/A  Psychiatric Specialty Exam: Review of Systems  All other systems reviewed and are negative.  There were no vitals  taken for this visit.There is no height or weight on file to calculate BMI.  General Appearance: NA  Eye Contact:  NA  Speech:  Clear and Coherent  Volume:  Normal  Mood:  Euthymic  Affect:  NA  Thought Process:  Goal Directed  Orientation:  Full (Time, Place, and Person)  Thought Content: WDL   Suicidal Thoughts:  No  Homicidal Thoughts:  No  Memory:  Immediate;   Good Recent;   Fair Remote;   Poor  Judgement:  Fair  Insight:  Lacking  Psychomotor Activity:  Normal  Concentration:  Concentration: Fair and Attention Span: Fair  Recall:  [employer]Fair  Fund of Knowledge: Fair  Language: Good  Akathisia:  No  Handed:  Right  AIMS (if indicated): not done  Assets:  Communication Skills Desire for Improvement Resilience Social Support  ADL's:  Intact  Cognition: Impaired,  Mild  Sleep:  Good   Screenings: PHQ2-9    Flowsheet Row Video [facility]20[facility]2021 [facility] Video Visit from 01[facility]29/2021 [facility] Office Visit from 01/08/2021 in [facility]  PHQ-2 Total Score 0 0 0 0 0      Flowsheet Row Video Visit from 08/11/2021 [facility] [facility] Video Visit from 01/29/2021 in [facility]  C-SSRS RISK CATEGORY No Risk No Risk No Risk        Assessment and Plan: This patient is a 61 year old male with a history depression anxiety and mild cognitive impairment.  He continues to do well on his current regimen although he is no longer taking the Prozac.  He will continue Xanax 1 mg twice daily for anxiety and trazodone 150 mg at bedtime for sleep.  He will return to see me in [id number]-month  Collaboration of Care: Collaboration of Care: Primary  Care Provider AEB chart records available to PCP through the epic system  Patient/Guardian was advised Release of Information must be obtained prior to any record release in order to collaborate their care with an outside provider. Patient/Guardian was advised if they have not already done so to contact the registration department to sign all necessary forms in order for [place] to release information regarding their care.   Consent: Patient/Guardian gives verbal consent for treatment and assignment of benefits for services provided during this visit. Patient/Guardian expressed understanding and agreed to proceed.    John Ruder, MD 08/11/2021, 11:32 AM

## 2021-08-19 ENCOUNTER — Other Ambulatory Visit: Payer: Self-pay

## 2021-08-19 ENCOUNTER — Ambulatory Visit
Admission: EM | Admit: 2021-08-19 | Discharge: 2021-08-19 | Disposition: A | Discharge status: Home / Self Care | Payer: Medicare Other | Attending: Family Medicine | Admitting: Family Medicine

## 2021-08-19 DIAGNOSIS — L309 Dermatitis, unspecified: Secondary | ICD-10-CM | POA: Diagnosis not present

## 2021-08-19 DIAGNOSIS — B351 Tinea unguium: Secondary | ICD-10-CM | POA: Diagnosis not present

## 2021-08-19 MED ORDER — TRIAMCINOLONE ACETONIDE 0.1 % EX CREA
1.0000 | TOPICAL_CREAM | Freq: Two times a day (BID) | CUTANEOUS | 1 refills | Status: DC
Start: 2021-08-19 — End: 2021-10-12

## 2021-08-19 MED ORDER — KETOCONAZOLE 2 % EX CREA: 1.0000 "application " | TOPICAL_CREAM | Freq: Two times a day (BID) | CUTANEOUS | 1 refills | Status: DC

## 2021-08-19 NOTE — ED Triage Notes (Signed)
Pt reports itching and dryness in hands x 1 month. States he was told he has a fungal infection in the hands.  ?

## 2021-08-19 NOTE — ED Provider Notes (Signed)
?[facility] ? ? ? ?CSN: [id number] ?Arrival date & time: 08/19/21  1506 ? ? ?  ? ?History   ?Chief Complaint ?Chief Complaint  ?Patient presents with  ? Hand Problem  ? ? ?HPI ?John [name] is a 61 y.o. male.  ? ?Presenting today with 1 month history of bilateral hand itchiness, dryness and nail thickening.  He states he thinks he has a fungal infection in his hands.  Has not been trying anything over-the-counter for symptoms.  Denies fever, chills, injury to the areas, new soaps or lotions at home. ? ? ?Past Medical History:  ?Diagnosis Date  ? Anxiety   ? Depression 2007  ? Diabetes mellitus without complication (HCC)   ? Helicobacter pylori ab+ 01/21/2019  ? Dx in 12/2018, will treat  ? Hyperlipidemia   ? Hypertension   ? Hypogonadism male   ? Obesity (BMI 30.0-34.9) 03/01/2017  ? ? ?Patient Active Problem List  ? Diagnosis Date Noted  ? Normocytic anemia 08/17/2020  ? Overweight (BMI 25.0-29.9) 03/18/2019  ? Insomnia 01/17/2013  ? HYPOGONADISM 09/15/2009  ? Diabetes mellitus type 2 in obese (HCC) 09/15/2009  ? Mixed hyperlipidemia 08/18/2009  ? Depression, major, single episode, in partial remission (HCC) 08/18/2009  ? Essential hypertension 08/18/2009  ? ? ?Past Surgical History:  ?Procedure Laterality Date  ? CIRCUMCISION N/A 06/10/2019  ? Procedure: CIRCUMCISION ADULT;  Surgeon: Malen Gauze, MD;  Location: [facility];  Service: Urology;  Laterality: N/A;  30 MINS  ? COLONOSCOPY  01/20/2011  ? Dr. Darrick Penna: large internal hemorrhoids  ? COLONOSCOPY WITH PROPOFOL N/A 09/21/2020  ? Procedure: COLONOSCOPY WITH PROPOFOL;  Surgeon: Lanelle Bal, DO;  Location: [facility];  Service: Endoscopy;  Laterality: N/A;  AM-diabetic  ? HERNIA REPAIR    ? ventral hernia repair   ? VASECTOMY    ? ? ? ?Home Medications   ? ?Prior to Admission medications   ?Medication Sig Start Date End Date Taking? Authorizing Provider  ?ketoconazole (NIZORAL) 2 % cream Apply 1 application topically 2 (two) times daily. 08/19/21   Yes Particia Nearing, PA-C  ?triamcinolone cream (KENALOG) 0.1 % Apply 1 application topically 2 (two) times daily. 08/19/21  Yes Particia Nearing, PA-C  ?ACCU-CHEK SOFTCLIX LANCETS lancets 100 each by Other route 4 (four) times daily. Use as instructed 04/30/18   Roma Kayser, MD  ?ALPRAZolam Prudy Feeler) 1 MG tablet Take 1 tablet (1 mg total) by mouth 2 (two) times daily. 08/11/21   Myrlene Broker, MD  ?amLODipine (NORVASC) 10 MG tablet Take 1 tablet (10 mg total) by mouth daily. 09/29/20   Kerri Perches, MD  ?amLODipine (NORVASC) 5 MG tablet Take 1 tablet (5 mg total) by mouth daily. 07/14/21   Kerri Perches, MD  ?Blood Glucose Monitoring Suppl (ACCU-CHEK AVIVA) device 1 each by Other route 2 (two) times daily. Use as instructed bid. E11.65 05/28/20   Freddy Finner, NP  ?cloNIDine (CATAPRES) 0.2 MG tablet Take 1 tablet (0.2 mg total) by mouth daily. 05/07/21   Kerri Perches, MD  ?fluconazole (DIFLUCAN) 150 MG tablet Take 1 tablet (150 mg total) by mouth once a week. 07/21/21   Wallis Bamberg, PA-C  ?glucose blood (ONETOUCH VERIO) test strip Use as instructed to test blood glucose twice daily. ONE TOUCH VERIO FLEX test strips 06/08/20   Roma Kayser, MD  ?hydrOXYzine (ATARAX) 25 MG tablet Take 0.5-1 tablets (12.5-25 mg total) by mouth every 8 (eight) hours as needed  for itching. 07/21/21   Wallis Bamberg, PA-C  ?lisinopril-hydrochlorothiazide (ZESTORETIC) 20-25 MG tablet Take 1 tablet by mouth daily. 07/14/21   Kerri Perches, MD  ?metFORMIN (GLUCOPHAGE) 500 MG tablet TAKE 1 TABLET BY MOUTH TWICE DAILY WITH A MEAL ?Patient taking differently: Take 500 mg by mouth 2 (two) times daily with a meal. TAKE 1 TABLET BY MOUTH TWICE DAILY WITH A MEAL 03/16/20   Dani Gobble, NP  ?rosuvastatin (CRESTOR) 20 MG tablet Take 1 tablet (20 mg total) by mouth daily. 07/14/21   Kerri Perches, MD  ?traZODone (DESYREL) 150 MG tablet Take 1 tablet (150 mg total) by mouth at bedtime. 08/11/21    Myrlene Broker, MD  ? ?Family History ?Family History  ?Problem Relation Age of Onset  ? Heart failure Mother   ?     living   ? Hypertension Mother   ? Hyperlipidemia Mother   ? Heart disease Mother   ? Hypertension Sister   ? Hypertension Brother   ? Colon cancer Neg Hx   ? ?Social History ?Social History  ? ?Tobacco Use  ? Smoking status: Never  ? Smokeless tobacco: Never  ?Vaping Use  ? Vaping Use: Never used  ?Substance Use Topics  ? Alcohol use: Yes  ?  Alcohol/week: 1.0 standard drink  ?  Types: 1 Cans of beer per week  ?  Comment: occasional beer socially.   ? Drug use: No  ? ? ? ?Allergies   ?Januvia [sitagliptin] and Poison oak extract [poison oak extract] ? ?Review of Systems ?Review of Systems ?Per HPI ? ?Physical Exam ?Triage Vital Signs ?ED Triage Vitals  ?Enc Vitals Group  ?   BP 08/19/21 1623 (!) 163/80  ?   Pulse Rate 08/19/21 1623 72  ?   Resp 08/19/21 1623 18  ?   Temp 08/19/21 1623 98.9 ?F (37.2 ?C)  ?   Temp Source 08/19/21 1623 Oral  ?   SpO2 08/19/21 1623 98 %  ?   Weight --   ?   Height --   ?   Head Circumference --   ?   Peak Flow --   ?   Pain Score 08/19/21 1708 0  ?   Pain Loc --   ?   Pain Edu? --   ?   Excl. in GC? --   ? ?No data found. ? ?Updated Vital Signs ?BP (!) 163/80 (BP Location: Right Arm)   Pulse 72   Temp 98.9 ?F (37.2 ?C) (Oral)   Resp 18   SpO2 98%  ? ?Visual Acuity ?Right Eye Distance:   ?Left Eye Distance:   ?Bilateral Distance:   ? ?Right Eye Near:   ?Left Eye Near:    ?Bilateral Near:    ? ?Physical Exam ?Vitals and nursing note reviewed.  ?Constitutional:   ?   Appearance: Normal appearance.  ?HENT:  ?   Head: Atraumatic.  ?Eyes:  ?   Extraocular Movements: Extraocular movements intact.  ?   Conjunctiva/sclera: Conjunctivae normal.  ?Cardiovascular:  ?   Rate and Rhythm: Normal rate and regular rhythm.  ?Pulmonary:  ?   Effort: Pulmonary effort is normal.  ?   Breath sounds: Normal breath sounds.  ?Musculoskeletal:     ?   General: Normal range of motion.  ?    Cervical back: Normal range of motion and neck supple.  ?Skin: ?   General: Skin is warm.  ?   Comments: Dry, callused, peeling palms.  Thickened nails, mildly discolored  ?Neurological:  ?   General: No focal deficit present.  ?   Mental Status: He is oriented to person, place, and time.  ?Psychiatric:     ?   Mood and Affect: Mood normal.     ?   Thought Content: Thought content normal.     ?   Judgment: Judgment normal.  ? ? ? ?UC Treatments / Results  ?Labs ?(all labs ordered are listed, but only abnormal results are displayed) ?Labs Reviewed - No data to display ? ?EKG ? ? ?Radiology ?No results found. ? ?Procedures ?Procedures (including critical care time) ? ?Medications Ordered in UC ?Medications - No data to display ? ?Initial Impression / Assessment and Plan / UC Course  ?I have reviewed the triage vital signs and the nursing notes. ? ?Pertinent labs & imaging results that were available during my care of the patient were reviewed by me and considered in my medical decision making (see chart for details). ? ?  ? ?Suspect nail fungus and possibly some hand dermatitis.  Treat with triamcinolone to the palms, ketoconazole cream to the nails.  Follow-up with primary care provider for recheck. ? ?Final Clinical Impressions(s) / UC Diagnoses  ? ?Final diagnoses:  ?Nail fungal infection  ?Hand dermatitis  ? ?Discharge Instructions   ?None ?  ? ?ED Prescriptions   ? ? Medication Sig Dispense Auth. Provider  ? ketoconazole (NIZORAL) 2 % cream Apply 1 application topically 2 (two) times daily. 80 g Particia Nearing, [place]  ? triamcinolone cream (KENALOG) 0.1 % Apply 1 application topically 2 (two) times daily. 80 g Particia Nearing, [place]  ? ?  ? ?PDMP not reviewed this encounter. ?  ?Particia Nearing, PA-C ?08/19/21 1722 ? ?

## 2021-09-02 ENCOUNTER — Ambulatory Visit: Payer: Medicare Other | Admitting: Family Medicine

## 2021-09-21 ENCOUNTER — Other Ambulatory Visit (HOSPITAL_COMMUNITY): Payer: Self-pay | Admitting: Psychiatry

## 2021-09-21 DIAGNOSIS — F322 Major depressive disorder, single episode, severe without psychotic features: Secondary | ICD-10-CM

## 2021-10-12 ENCOUNTER — Encounter (HOSPITAL_COMMUNITY): Payer: Self-pay

## 2021-10-12 ENCOUNTER — Emergency Department (HOSPITAL_COMMUNITY)
Admission: EM | Admit: 2021-10-12 | Discharge: 2021-10-12 | Disposition: A | Discharge status: Home / Self Care | Payer: Medicare Other | Attending: Emergency Medicine | Admitting: Emergency Medicine

## 2021-10-12 ENCOUNTER — Emergency Department (HOSPITAL_COMMUNITY): Payer: Medicare Other

## 2021-10-12 ENCOUNTER — Other Ambulatory Visit: Payer: Self-pay

## 2021-10-12 DIAGNOSIS — L309 Dermatitis, unspecified: Secondary | ICD-10-CM | POA: Diagnosis not present

## 2021-10-12 DIAGNOSIS — Z79899 Other long term (current) drug therapy: Secondary | ICD-10-CM | POA: Insufficient documentation

## 2021-10-12 DIAGNOSIS — E119 Type 2 diabetes mellitus without complications: Secondary | ICD-10-CM | POA: Diagnosis not present

## 2021-10-12 DIAGNOSIS — M25551 Pain in right hip: Secondary | ICD-10-CM | POA: Insufficient documentation

## 2021-10-12 DIAGNOSIS — I10 Essential (primary) hypertension: Secondary | ICD-10-CM | POA: Insufficient documentation

## 2021-10-12 DIAGNOSIS — Z7984 Long term (current) use of oral hypoglycemic drugs: Secondary | ICD-10-CM | POA: Insufficient documentation

## 2021-10-12 MED ORDER — LIDOCAINE 5 % EX PTCH
1.0000 | MEDICATED_PATCH | CUTANEOUS | Status: DC
  Administered 2021-10-12: 1 via TRANSDERMAL
  Filled 2021-10-12: qty 1

## 2021-10-12 MED ORDER — TRIAMCINOLONE ACETONIDE 0.1 % EX CREA: 1.0000 "application " | TOPICAL_CREAM | Freq: Two times a day (BID) | CUTANEOUS | 1 refills | Status: DC

## 2021-10-12 MED ORDER — LIDOCAINE 5 % EX PTCH
1.0000 | MEDICATED_PATCH | CUTANEOUS | 0 refills | Status: DC
Start: 2021-10-12 — End: 2021-11-04

## 2021-10-12 MED ORDER — OXYCODONE-ACETAMINOPHEN 5-325 MG PO TABS
1.0000 | ORAL_TABLET | Freq: Once | ORAL | Status: AC
  Administered 2021-10-12: 1 via ORAL
  Filled 2021-10-12: qty 1

## 2021-10-12 NOTE — ED Provider Notes (Signed)
?[facility] ?Provider Note ? ? ?CSN: [id number] ?Arrival date & time: 10/12/21  0900 ? ?  ? ?History ? ?Chief Complaint  ?Patient presents with  ? Hip Pain  ? ? ?Marye RoundGary K Liptak is a 61 y.o. male. ? ?Patient with history of hypertension, hyperlipidemia, and diabetes who presents today with complaints of right hip pain.  He states that yesterday he was sitting down rolling pennies and when he went to stand up he noticed that he had a pain on the right side of his hip. He states that it felt stabbing in nature and did not radiate. He states that the pain was mild, but when he woke up this morning he had difficulty walking due to pain. He states that he subsequently took tylenol and ibuprofen with minimal relief. He denies any sharp shooting pain down his legs or numbness/tingling in his extremities.  He denies any fevers or chills.  Denies any loss of bowel or bladder function or saddle paresthesias. Patient does not smoke, drink alcohol, and denies recreational drug use and IVDU. ? ?The history is provided by the patient. No language interpreter was used.  ?Hip Pain ? ? ?  ? ?Home Medications ?Prior to Admission medications   ?Medication Sig Start Date End Date Taking? Authorizing Provider  ?ACCU-CHEK SOFTCLIX LANCETS lancets 100 each by Other route 4 (four) times daily. Use as instructed 04/30/18   Roma KayserNida, Gebreselassie W, MD  ?ALPRAZolam Prudy Feeler(XANAX) 1 MG tablet Take 1 tablet by mouth twice daily 09/21/21   Myrlene Brokeross, Deborah R, MD  ?amLODipine (NORVASC) 10 MG tablet Take 1 tablet (10 mg total) by mouth daily. 09/29/20   Kerri PerchesSimpson, Margaret E, MD  ?amLODipine (NORVASC) 5 MG tablet Take 1 tablet (5 mg total) by mouth daily. 07/14/21   Kerri PerchesSimpson, Margaret E, MD  ?Blood Glucose Monitoring Suppl (ACCU-CHEK AVIVA) device 1 each by Other route 2 (two) times daily. Use as instructed bid. E11.65 05/28/20   Freddy FinnerMills, Hannah M, NP  ?cloNIDine (CATAPRES) 0.2 MG tablet Take 1 tablet (0.2 mg total) by mouth daily. 05/07/21    Kerri PerchesSimpson, Margaret E, MD  ?fluconazole (DIFLUCAN) 150 MG tablet Take 1 tablet (150 mg total) by mouth once a week. 07/21/21   Wallis BambergMani, Mario, PA-C  ?glucose blood (ONETOUCH VERIO) test strip Use as instructed to test blood glucose twice daily. ONE TOUCH VERIO FLEX test strips 06/08/20   Roma KayserNida, Gebreselassie W, MD  ?hydrOXYzine (ATARAX) 25 MG tablet Take 0.5-1 tablets (12.5-25 mg total) by mouth every 8 (eight) hours as needed for itching. 07/21/21   Wallis BambergMani, Mario, PA-C  ?ketoconazole (NIZORAL) 2 % cream Apply 1 application topically 2 (two) times daily. 08/19/21   Particia NearingLane, Rachel Elizabeth, PA-C  ?lisinopril-hydrochlorothiazide (ZESTORETIC) 20-25 MG tablet Take 1 tablet by mouth daily. 07/14/21   Kerri PerchesSimpson, Margaret E, MD  ?metFORMIN (GLUCOPHAGE) 500 MG tablet TAKE 1 TABLET BY MOUTH TWICE DAILY WITH A MEAL ?Patient taking differently: Take 500 mg by mouth 2 (two) times daily with a meal. TAKE 1 TABLET BY MOUTH TWICE DAILY WITH A MEAL 03/16/20   Dani Gobbleeardon, Whitney J, NP  ?rosuvastatin (CRESTOR) 20 MG tablet Take 1 tablet (20 mg total) by mouth daily. 07/14/21   Kerri PerchesSimpson, Margaret E, MD  ?traZODone (DESYREL) 150 MG tablet Take 1 tablet (150 mg total) by mouth at bedtime. 08/11/21   Myrlene Brokeross, Deborah R, MD  ?triamcinolone cream (KENALOG) 0.1 % Apply 1 application topically 2 (two) times daily. 08/19/21   Particia NearingLane, Rachel Elizabeth, PA-C  ?   ? ?  Allergies    ?Januvia [sitagliptin] and Poison oak extract [poison oak extract]   ? ?Review of Systems   ?Review of Systems  ?Constitutional:  Negative for chills and fever.  ?Musculoskeletal:  Positive for arthralgias and myalgias. Negative for gait problem.  ?All other systems reviewed and are negative. ? ?Physical Exam ?Updated Vital Signs ?BP (!) 159/98 (BP Location: Right Arm)   Pulse 79   Temp 99.2 ?F (37.3 ?C) (Oral)   Resp 18   Ht 5\' 8"  (1.727 m)   Wt 79.8 kg   SpO2 100%   BMI 26.76 kg/m?  ?Physical Exam ?Vitals and nursing note reviewed.  ?Constitutional:   ?   General: He is not in acute  distress. ?   Appearance: Normal appearance. He is normal weight. He is not ill-appearing, toxic-appearing or diaphoretic.  ?HENT:  ?   Head: Normocephalic and atraumatic.  ?Cardiovascular:  ?   Rate and Rhythm: Normal rate.  ?Pulmonary:  ?   Effort: Pulmonary effort is normal. No respiratory distress.  ?Musculoskeletal:     ?   General: Normal range of motion.  ?   Cervical back: Normal range of motion.  ?   Comments: Tenderness to palpation of the lateral aspect of the right hip. No obvious swelling, bruising, erythema, warmth or deformity noted. DP and PT pulses intact and 2+. ROM intact with some pain. Patient able to ambulate with some pain. ? ?Erythematous macular rash noted to the posterior portion of his right hand.  Consistent with contact dermatitis, noninfectious appearing.  ?Skin: ?   General: Skin is warm and dry.  ?Neurological:  ?   General: No focal deficit present.  ?   Mental Status: He is alert.  ?Psychiatric:     ?   Mood and Affect: Mood normal.     ?   Behavior: Behavior normal.  ? ? ?ED Results / Procedures / Treatments   ?Labs ?(all labs ordered are listed, but only abnormal results are displayed) ?Labs Reviewed - No data to display ? ?EKG ?None ? ?Radiology ?DG Hip Unilat W or Wo Pelvis 2-3 Views Right ? ?Result Date: 10/12/2021 ?CLINICAL DATA:  Right-sided hip pain. EXAM: DG HIP (WITH OR WITHOUT PELVIS) 2-3V RIGHT COMPARISON:  None. FINDINGS: There is no evidence of hip fracture or dislocation. There is no evidence of arthropathy or other focal bone abnormality. IMPRESSION: Negative. Electronically Signed   By: 10/14/2021 M.D.   On: 10/12/2021 11:39   ? ?Procedures ?Procedures  ? ? ?Medications Ordered in ED ?Medications  ?lidocaine (LIDODERM) 5 % 1 patch (1 patch Transdermal Patch Applied 10/12/21 1335)  ?oxyCODONE-acetaminophen (PERCOCET/ROXICET) 5-325 MG per tablet 1 tablet (1 tablet Oral Given 10/12/21 1335)  ? ? ?ED Course/ Medical Decision Making/ A&P ?  ?                         ?Medical Decision Making ?Amount and/or Complexity of Data Reviewed ?Radiology: ordered. ? ?Risk ?Prescription drug management. ? ? ?Patient presents today with right hip pain began upon standing yesterday evening.  Patient X-Ray negative for obvious fracture or dislocation.  I have personally reviewed this imaging and agree with radiology interpretation.  He is afebrile, nontoxic-appearing, and in no acute distress with reassuring vital signs.  Tenderness noted to palpation of the lateral right hip without any erythema or warmth.  He is able to ambulate with some discomfort.  Very low suspicion for septic arthritis at this  time.  Low suspicion for avascular necrosis as well as any other emergent concerns at this time.  Given the location of the patient's pain along the IT band of the right hip, I have some suspicion that this may be trochanteric bursitis.  I have discussed this with the patient and recommended RICE for same as well as management with ibuprofen as needed.  Will also give orthopedic referral for continued evaluation and management for his symptoms and potential steroid injection.  Patient is understanding and amenable with plan.  Given Percocet and lidocaine patch in the ER today for additional relief.  Educated on red flag symptoms of prompt immediate return.  Discharged in stable condition. ? ? ?Additionally, of note when I went to inform the patient that he was being discharged, he did mention that he has an itchy rash on the back of his right hand that he has been prescribed Triamcinolone cream for previously with relief.  He requests a refill for this as he has run out.  Will refill.  No further concerns. ? ?Final Clinical Impression(s) / ED Diagnoses ?Final diagnoses:  ?Right hip pain  ?Dermatitis  ? ? ?Rx / DC Orders ?ED Discharge Orders   ? ?      Ordered  ?  triamcinolone cream (KENALOG) 0.1 %  2 times daily       ? 10/12/21 1354  ?  lidocaine (LIDODERM) 5 %  Every 24 hours       ? 10/12/21  1355  ? ?  ?  ? ?  ?An After Visit Summary was printed and given to the patient. ? ? ?  ?Silva Bandy, PA-C ?10/12/21 1356 ? ?  ?Bethann Berkshire, MD ?10/12/21 1738 ? ?

## 2021-10-12 NOTE — Discharge Instructions (Addendum)
As we discussed, your work-up in the ER today was reassuring for acute abnormalities.  I have given you a prescription for lidocaine patches that you can apply to the areas that hurt.  I also recommend that you take ibuprofen and rest, ice, compress, and elevate.  Areas that hurt.  Have also given you a referral to orthopedics with a number to call to schedule an appointment as needed for further evaluation and management of your pain. ? ?Return if development of any new or worsening symptoms. ?

## 2021-10-12 NOTE — ED Triage Notes (Signed)
Patient with complaints of right hip pain that started yesterday without injury.  ?

## 2021-10-18 ENCOUNTER — Other Ambulatory Visit (HOSPITAL_COMMUNITY): Payer: Self-pay | Admitting: Psychiatry

## 2021-10-18 DIAGNOSIS — F322 Major depressive disorder, single episode, severe without psychotic features: Secondary | ICD-10-CM

## 2021-10-26 ENCOUNTER — Ambulatory Visit: Payer: Medicare Other | Admitting: Family Medicine

## 2021-11-04 ENCOUNTER — Ambulatory Visit (INDEPENDENT_AMBULATORY_CARE_PROVIDER_SITE_OTHER): Payer: Medicare Other | Admitting: Family Medicine

## 2021-11-04 ENCOUNTER — Encounter: Payer: Self-pay | Admitting: Family Medicine

## 2021-11-04 VITALS — BP 114/68 | HR 78 | Ht 68.0 in | Wt 177.1 lb

## 2021-11-04 DIAGNOSIS — E1169 Type 2 diabetes mellitus with other specified complication: Secondary | ICD-10-CM | POA: Diagnosis not present

## 2021-11-04 DIAGNOSIS — Z125 Encounter for screening for malignant neoplasm of prostate: Secondary | ICD-10-CM

## 2021-11-04 DIAGNOSIS — E782 Mixed hyperlipidemia: Secondary | ICD-10-CM | POA: Diagnosis not present

## 2021-11-04 DIAGNOSIS — I10 Essential (primary) hypertension: Secondary | ICD-10-CM

## 2021-11-04 DIAGNOSIS — B354 Tinea corporis: Secondary | ICD-10-CM

## 2021-11-04 DIAGNOSIS — E1159 Type 2 diabetes mellitus with other circulatory complications: Secondary | ICD-10-CM | POA: Diagnosis not present

## 2021-11-04 DIAGNOSIS — E559 Vitamin D deficiency, unspecified: Secondary | ICD-10-CM

## 2021-11-04 DIAGNOSIS — G4700 Insomnia, unspecified: Secondary | ICD-10-CM | POA: Diagnosis not present

## 2021-11-04 DIAGNOSIS — E663 Overweight: Secondary | ICD-10-CM

## 2021-11-04 DIAGNOSIS — F324 Major depressive disorder, single episode, in partial remission: Secondary | ICD-10-CM

## 2021-11-04 MED ORDER — TERBINAFINE HCL 250 MG PO TABS: 250.0000 mg | ORAL_TABLET | Freq: Every day | ORAL | 0 refills | Status: DC

## 2021-11-04 MED ORDER — CLOBETASOL PROPIONATE 0.05 % EX CREA: 1.0000 "application " | TOPICAL_CREAM | Freq: Two times a day (BID) | CUTANEOUS | 1 refills | Status: DC

## 2021-11-04 NOTE — Assessment & Plan Note (Signed)
Controlled, no change in medication  

## 2021-11-04 NOTE — Assessment & Plan Note (Signed)
DASH diet and commitment to daily physical activity for a minimum of 30 minutes discussed and encouraged, as a part of hypertension management. The importance of attaining a healthy weight is also discussed.     11/04/2021    8:57 AM 10/12/2021    9:18 AM 08/19/2021    4:23 PM 07/21/2021    3:40 PM 07/21/2021    3:38 PM 07/14/2021   10:29 AM 07/14/2021    9:53 AM  BP/Weight  Systolic BP 114 159 163  171 150 145  Diastolic BP 68 98 80  95 82 80  Wt. (Lbs) 177.08 176  176   176.04  BMI 26.92 kg/m2 26.76 kg/m2  26.76 kg/m2   26.77 kg/m2     Controlled, no change in medication

## 2021-11-04 NOTE — Assessment & Plan Note (Signed)
  Patient re-educated about  the importance of commitment to a  minimum of 150 minutes of exercise per week as able.  The importance of healthy food choices with portion control discussed, as well as eating regularly and within a 12 hour window most days. The need to choose "clean , green" food 50 to 75% of the time is discussed, as well as to make water the primary drink and set a goal of 64 ounces water daily.       11/04/2021    8:57 AM 10/12/2021    9:18 AM 07/21/2021    3:40 PM  Weight /BMI  Weight 177 lb 1.3 oz 176 lb 176 lb  Height 5\' 8"  (1.727 m) 5\' 8"  (1.727 m) 5\' 8"  (1.727 m)  BMI 26.92 kg/m2 26.76 kg/m2 26.76 kg/m2

## 2021-11-04 NOTE — Progress Notes (Signed)
John [name]     MRN: [id number]      DOB: 09/26/1960   HPI John [name] is here for follow up and re-evaluation of chronic medical conditions, medication management and review of any available recent lab and radiology data.  Preventive health is updated, specifically  Cancer screening and Immunization.   Questions or concerns regarding consultations or procedures which the PT has had in the interim are  addressed. The PT denies any adverse reactions to current medications since the last visit.  C/o itchy rash on neck, arms , chest, palms Denies polyuria, polydipsia, blurred vision , or hypoglycemic episodes. Not testing blood sugar and not taking diabetic meds   ROS Denies recent fever or chills. Denies sinus pressure, nasal congestion, ear pain or sore throat. Denies chest congestion, productive cough or wheezing. Denies chest pains, palpitations and leg swelling Denies abdominal pain, nausea, vomiting,diarrhea or constipation.   Denies dysuria, frequency, hesitancy or incontinence. Denies joint pain, swelling and limitation in mobility. Denies headaches, seizures, numbness, or tingling. Denies depression, anxiety or insomnia.    PE  BP 114/68   Pulse 78   Ht 5\' 8"  (1.727 m)   Wt 177 lb 1.3 oz (80.3 kg)   SpO2 97%   BMI 26.92 kg/m   Patient alert and oriented and in no cardiopulmonary distress.  HEENT: No facial asymmetry, EOMI,     Neck supple .  Chest: Clear to auscultation bilaterally.  CVS: S1, S2 no murmurs, no S3.Regular rate.  ABD: Soft non tender.   Ext: No edema  MS: Adequate ROM spine, shoulders, hips and knees.  Skin: Intact,tinea corporis, palms , neck , torso, arms , feet  Psych: Good eye contact, normal affect. Memory intact not anxious or depressed appearing.  CNS: CN 2-12 intact, power,  normal throughout.no focal deficits noted.   Assessment & Plan  Diabetes mellitus type 2 in obese [facility]) John [name] is reminded of the importance of  commitment to daily physical activity for 30 minutes or more, as able and the need to limit carbohydrate intake to 30 to 60 grams per meal to help with blood sugar control.   The need to take medication as prescribed, test blood sugar as directed, and to call between visits if there is a concern that blood sugar is uncontrolled is also discussed.   John [name] is reminded of the importance of daily foot exam, annual eye examination, and good blood sugar, blood pressure and cholesterol control.     Latest Ref Rng & Units 05/07/2021   11:08 AM 01/08/2021   10:17 AM 09/29/2020   11:36 AM 09/29/2020   10:59 AM 09/18/2020    1:00 PM  Diabetic Labs  HbA1c 4.8 - 5.6 % 6.0     8.9     Chol 100 - 199 mg/dL 11/18/2020   619   509      HDL >39 mg/dL 52   43   43      Calc LDL 0 - 99 mg/dL 326   712   458      Triglycerides 0 - 149 mg/dL 099   833   825      Creatinine 0.76 - 1.27 mg/dL 053   9.76   7.34    1.93        11/04/2021    8:57 AM 10/12/2021    9:18 AM 08/19/2021    4:23 PM 07/21/2021    3:40 PM 07/21/2021    3:38 PM  07/14/2021   10:29 AM 07/14/2021    9:53 AM  BP/Weight  Systolic BP 114 159 163  171 150 145  Diastolic BP 68 98 80  95 82 80  Wt. (Lbs) 177.08 176  176   176.04  BMI 26.92 kg/m2 26.76 kg/m2  26.76 kg/m2   26.77 kg/m2      Latest Ref Rng & Units 06/17/2021   12:00 AM 07/09/2019    3:59 PM  Foot/eye exam completion dates  Eye Exam No Retinopathy No Retinopathy      No Retinopathy          This result is from an external source.      Updated lab needed   Essential hypertension DASH diet and commitment to daily physical activity for a minimum of 30 minutes discussed and encouraged, as a part of hypertension management. The importance of attaining a healthy weight is also discussed.     11/04/2021    8:57 AM 10/12/2021    9:18 AM 08/19/2021    4:23 PM 07/21/2021    3:40 PM 07/21/2021    3:38 PM 07/14/2021   10:29 AM 07/14/2021    9:53 AM  BP/Weight  Systolic BP 114 159 163   171 800 145  Diastolic BP 68 98 80  95 82 80  Wt. (Lbs) 177.08 176  176   176.04  BMI 26.92 kg/m2 26.76 kg/m2  26.76 kg/m2   26.77 kg/m2     Controlled, no change in medication   Mixed hyperlipidemia Hyperlipidemia:Low fat diet discussed and encouraged.   Lipid Panel  Lab Results  Component Value Date   CHOL 205 (H) 05/07/2021   HDL 52 05/07/2021   LDLCALC 131 (H) 05/07/2021   TRIG 123 05/07/2021   CHOLHDL 3.9 05/07/2021     Updated lab needed.   Overweight (BMI 25.0-29.9)  Patient re-educated about  the importance of commitment to a  minimum of 150 minutes of exercise per week as able.  The importance of healthy food choices with portion control discussed, as well as eating regularly and within a 12 hour window most days. The need to choose "clean , green" food 50 to 75% of the time is discussed, as well as to make water the primary drink and set a goal of 64 ounces water daily.       11/04/2021    8:57 AM 10/12/2021    9:18 AM 07/21/2021    3:40 PM  Weight /BMI  Weight 177 lb 1.3 oz 176 lb 176 lb  Height 5\' 8"  (1.727 m) 5\' 8"  (1.727 m) 5\' 8"  (1.727 m)  BMI 26.92 kg/m2 26.76 kg/m2 26.76 kg/m2      Insomnia Controlled, no change in medication Managed by psych  Depression, major, single episode, in partial remission (HCC) Controlled, no change in medication   Tinea corporis Terbinafine and betamethasone prescribed

## 2021-11-04 NOTE — Assessment & Plan Note (Signed)
Controlled, no change in medication Managed by psych 

## 2021-11-04 NOTE — Patient Instructions (Addendum)
Annual exam July 23 or after  Labs today, lipid, cmp and EGFr, cBC, hBA1C, TSH, PSA and vit D and microalb  Need shingrix # 2 check pharmacy today  Need TdAP , I  am recommending getting in June  Nurse pls document shingrix #1   Terbinafine tablets are perescribed  for skin infection and betamethasone cream for itchy rash  Thanks for choosing [facility], we consider it a privelige to serve you.

## 2021-11-04 NOTE — Assessment & Plan Note (Signed)
Hyperlipidemia:Low fat diet discussed and encouraged.   Lipid Panel  Lab Results  Component Value Date   CHOL 205 (H) 05/07/2021   HDL 52 05/07/2021   LDLCALC 131 (H) 05/07/2021   TRIG 123 05/07/2021   CHOLHDL 3.9 05/07/2021     Updated lab needed.

## 2021-11-04 NOTE — Assessment & Plan Note (Signed)
Terbinafine and betamethasone prescribed

## 2021-11-04 NOTE — Assessment & Plan Note (Signed)
John [name] is reminded of the importance of commitment to daily physical activity for 30 minutes or more, as able and the need to limit carbohydrate intake to 30 to 60 grams per meal to help with blood sugar control.   The need to take medication as prescribed, test blood sugar as directed, and to call between visits if there is a concern that blood sugar is uncontrolled is also discussed.   John [name] is reminded of the importance of daily foot exam, annual eye examination, and good blood sugar, blood pressure and cholesterol control.     Latest Ref Rng & Units 05/07/2021   11:08 AM 01/08/2021   10:17 AM 09/29/2020   11:36 AM 09/29/2020   10:59 AM 09/18/2020    1:00 PM  Diabetic Labs  HbA1c 4.8 - 5.6 % 6.0     8.9     Chol 100 - 199 mg/dL 205   171   211      HDL >39 mg/dL 52   43   43      Calc LDL 0 - 99 mg/dL 131   104   146      Triglycerides 0 - 149 mg/dL 123   133   124      Creatinine 0.76 - 1.27 mg/dL 1.07   1.13   1.19    1.18        11/04/2021    8:57 AM 10/12/2021    9:18 AM 08/19/2021    4:23 PM 07/21/2021    3:40 PM 07/21/2021    3:38 PM 07/14/2021   10:29 AM 07/14/2021    9:53 AM  BP/Weight  Systolic BP [id number]  [id number] Q000111Q Q000111Q  Diastolic BP 68 98 80  95 82 80  Wt. (Lbs) 177.08 176  176   176.04  BMI 26.92 kg/m2 26.76 kg/m2  26.76 kg/m2   26.77 kg/m2      Latest Ref Rng & Units 06/17/2021   12:00 AM 07/09/2019    3:59 PM  Foot/eye exam completion dates  Eye Exam No Retinopathy No Retinopathy      No Retinopathy          This result is from an external source.      Updated lab needed

## 2021-11-06 LAB — CMP14+EGFR
ALT: 13 IU/L (ref 0–44)
AST: 18 IU/L (ref 0–40)
Albumin/Globulin Ratio: 1.6 (ref 1.2–2.2)
Albumin: 4.1 g/dL (ref 3.8–4.8)
Alkaline Phosphatase: 77 IU/L (ref 44–121)
BUN/Creatinine Ratio: 14 (ref 10–24)
BUN: 16 mg/dL (ref 8–27)
Bilirubin Total: 0.4 mg/dL (ref 0.0–1.2)
CO2: 25 mmol/L (ref 20–29)
Calcium: 9.7 mg/dL (ref 8.6–10.2)
Chloride: 98 mmol/L (ref 96–106)
Creatinine, Ser: 1.13 mg/dL (ref 0.76–1.27)
Globulin, Total: 2.6 g/dL (ref 1.5–4.5)
Glucose: 135 mg/dL — ABNORMAL HIGH (ref 70–99)
Potassium: 4.4 mmol/L (ref 3.5–5.2)
Sodium: 136 mmol/L (ref 134–144)
Total Protein: 6.7 g/dL (ref 6.0–8.5)
eGFR: 74 mL/min/{1.73_m2} (ref >59)

## 2021-11-06 LAB — LIPID PANEL
Chol/HDL Ratio: 2.9 ratio (ref 0.0–5.0)
Cholesterol, Total: 174 mg/dL (ref 100–199)
HDL: 61 mg/dL (ref >39)
LDL Chol Calc (NIH): 103 mg/dL — ABNORMAL HIGH (ref 0–99)
Triglycerides: 47 mg/dL (ref 0–149)
VLDL Cholesterol Cal: 10 mg/dL (ref 5–40)

## 2021-11-06 LAB — CBC
Hematocrit: 35.8 % — ABNORMAL LOW (ref 37.5–51.0)
Hemoglobin: 11.9 g/dL — ABNORMAL LOW (ref 13.0–17.7)
MCH: 32.1 pg (ref 26.6–33.0)
MCHC: 33.2 g/dL (ref 31.5–35.7)
MCV: 97 fL (ref 79–97)
Platelets: 290 10*3/uL (ref 150–450)
RBC: 3.71 x10E6/uL — ABNORMAL LOW (ref 4.14–5.80)
RDW: 11.9 % (ref 11.6–15.4)
WBC: 7.2 10*3/uL (ref 3.4–10.8)

## 2021-11-06 LAB — VITAMIN D 25 HYDROXY (VIT D DEFICIENCY, FRACTURES): Vit D, 25-Hydroxy: 31.6 ng/mL (ref 30.0–100.0)

## 2021-11-06 LAB — HEMOGLOBIN A1C
Est. average glucose Bld gHb Est-mCnc: 123 mg/dL
Hgb A1c MFr Bld: 5.9 % — ABNORMAL HIGH (ref 4.8–5.6)

## 2021-11-06 LAB — TSH: TSH: 1.83 u[IU]/mL (ref 0.450–4.500)

## 2021-11-06 LAB — PSA: Prostate Specific Ag, Serum: 1 ng/mL (ref 0.0–4.0)

## 2021-11-06 LAB — MICROALBUMIN / CREATININE URINE RATIO
Creatinine, Urine: 65.5 mg/dL
Microalb/Creat Ratio: 149 mg/g creat — ABNORMAL HIGH (ref 0–29)
Microalbumin, Urine: 97.3 ug/mL

## 2021-11-10 ENCOUNTER — Encounter: Payer: Self-pay | Admitting: Family Medicine

## 2021-11-23 ENCOUNTER — Other Ambulatory Visit (HOSPITAL_COMMUNITY): Payer: Self-pay | Admitting: Psychiatry

## 2021-11-23 DIAGNOSIS — F322 Major depressive disorder, single episode, severe without psychotic features: Secondary | ICD-10-CM

## 2021-11-23 NOTE — Telephone Encounter (Signed)
Call for appt

## 2021-11-30 ENCOUNTER — Other Ambulatory Visit: Payer: Self-pay | Admitting: Family Medicine

## 2021-12-22 ENCOUNTER — Other Ambulatory Visit (HOSPITAL_COMMUNITY): Payer: Self-pay | Admitting: Psychiatry

## 2021-12-22 DIAGNOSIS — F322 Major depressive disorder, single episode, severe without psychotic features: Secondary | ICD-10-CM

## 2021-12-23 NOTE — Telephone Encounter (Signed)
Call for appt

## 2022-01-05 NOTE — Telephone Encounter (Signed)
LMOM

## 2022-01-11 ENCOUNTER — Encounter: Payer: Self-pay | Admitting: Family Medicine

## 2022-01-11 ENCOUNTER — Ambulatory Visit (INDEPENDENT_AMBULATORY_CARE_PROVIDER_SITE_OTHER): Payer: Medicare Other | Admitting: Family Medicine

## 2022-01-11 VITALS — BP 166/80 | HR 83 | Ht 68.0 in | Wt 176.1 lb

## 2022-01-11 DIAGNOSIS — E669 Obesity, unspecified: Secondary | ICD-10-CM | POA: Diagnosis not present

## 2022-01-11 DIAGNOSIS — I10 Essential (primary) hypertension: Secondary | ICD-10-CM

## 2022-01-11 DIAGNOSIS — Z0001 Encounter for general adult medical examination with abnormal findings: Secondary | ICD-10-CM | POA: Diagnosis not present

## 2022-01-11 DIAGNOSIS — E1169 Type 2 diabetes mellitus with other specified complication: Secondary | ICD-10-CM

## 2022-01-11 DIAGNOSIS — E782 Mixed hyperlipidemia: Secondary | ICD-10-CM

## 2022-01-11 DIAGNOSIS — M79674 Pain in right toe(s): Secondary | ICD-10-CM

## 2022-01-11 LAB — GLUCOSE, POCT (MANUAL RESULT ENTRY): POC Glucose: 139 mg/dl — AB (ref 70–99)

## 2022-01-11 NOTE — Assessment & Plan Note (Signed)
Uncontrolled due to non compliance, resume clonidine and lisinopril/HCTZ DASH diet and commitment to daily physical activity for a minimum of 30 minutes discussed and encouraged, as a part of hypertension management. The importance of attaining a healthy weight is also discussed.     01/11/2022    9:14 AM 01/11/2022    8:26 AM 01/11/2022    8:24 AM 11/04/2021    8:57 AM 10/12/2021    9:18 AM 08/19/2021    4:23 PM 07/21/2021    3:40 PM  BP/Weight  Systolic BP 166 170 171 114 159 163   Diastolic BP 80 78 97 68 98 80   Wt. (Lbs)   176.12 177.08 176  176  BMI   26.78 kg/m2 26.92 kg/m2 26.76 kg/m2  26.76 kg/m2

## 2022-01-11 NOTE — Assessment & Plan Note (Signed)

## 2022-01-11 NOTE — Assessment & Plan Note (Signed)
Mr. Dass is reminded of the importance of commitment to daily physical activity for 30 minutes or more, as able and the need to limit carbohydrate intake to 30 to 60 grams per meal to help with blood sugar control.   The need to take medication as prescribed, test blood sugar as directed, and to call between visits if there is a concern that blood sugar is uncontrolled is also discussed.   Mr. Cryder is reminded of the importance of daily foot exam, annual eye examination, and good blood sugar, blood pressure and cholesterol control.     Latest Ref Rng & Units 11/04/2021    9:44 AM 05/07/2021   11:08 AM 01/08/2021   10:17 AM 09/29/2020   11:36 AM 09/29/2020   10:59 AM  Diabetic Labs  HbA1c 4.8 - 5.6 % 5.9  6.0    8.9   Micro/Creat Ratio 0 - 29 mg/g creat 149       Chol 100 - 199 mg/dL 196  222  979  892    HDL >39 mg/dL 61  52  43  43    Calc LDL 0 - 99 mg/dL 119  417  408  144    Triglycerides 0 - 149 mg/dL 47  818  563  149    Creatinine 0.76 - 1.27 mg/dL 7.02  6.37  8.58  8.50        01/11/2022    9:14 AM 01/11/2022    8:26 AM 01/11/2022    8:24 AM 11/04/2021    8:57 AM 10/12/2021    9:18 AM 08/19/2021    4:23 PM 07/21/2021    3:40 PM  BP/Weight  Systolic BP 166 170 171 114 159 163   Diastolic BP 80 78 97 68 98 80   Wt. (Lbs)   176.12 177.08 176  176  BMI   26.78 kg/m2 26.92 kg/m2 26.76 kg/m2  26.76 kg/m2      Latest Ref Rng & Units 06/17/2021   12:00 AM 07/09/2019    3:59 PM  Foot/eye exam completion dates  Eye Exam No Retinopathy No Retinopathy     No Retinopathy         This result is from an external source.      Not taking meds, random blood glucose in office good Updated lab needed at/ before next visit.

## 2022-01-11 NOTE — Patient Instructions (Signed)
Follow-up second week in September 2 vaccine at visit call if you need me sooner.  Blood pressure is high you need to start lisinopril HCTZ 1 every morning and clonidine 1 at bedtime.  Fasting lipids CMP and EGFR HbA1c 5 to 7 days before next visit.  Very important that you take the 2 medications prescribed every day.  You are referred to Podiatry re right 5th toe pain  Thanks for choosing [facility], we consider it a privelige to serve you.

## 2022-01-11 NOTE — Progress Notes (Signed)
John [name]     MRN: [id number]      DOB: Aug 28, 1960   HPI: Patient is in for annual physical exam. C/o right 5th toe pain x weeks Immunization is reviewed , and  updated if needed.    PE; BP (!) 170/78 (BP Location: Right Arm, Cuff Size: Normal)   Pulse 83   Ht 5\' 8"  (1.727 m)   Wt 176 lb 1.9 oz (79.9 kg)   SpO2 97%   BMI 26.78 kg/m   Pleasant male, alert and oriented  HEENT No facial trauma or asymetry. Sinuses non tender.poor dentiton EOMI External ears normal,  Neck: supple, no adenopathy,JVD or thyromegaly.No bruits.  Chest: Clear to ascultation bilaterally.No crackles or wheezes. Non tender to palpation  Cardiovascular system; Heart sounds normal,  S1 and  S2 ,no S3.  No murmur, or thrill. Apical beat not displaced Peripheral pulses normal.  Abdomen: Soft, non tender, no organomegaly or masses. No bruits. Bowel sounds normal. No guarding, tenderness or rebound.    Musculoskeletal exam: Full ROM of spine, hips , shoulders and knees. No deformity ,swelling or crepitus noted. No muscle wasting or atrophy.  Tender over lateral aspect right 5th toe, excess horny skin at nailbed, no erythema , warmth or drainage  Neurologic: Cranial nerves 2 to 12 intact. Power, tone ,sensation and reflexes normal throughout. No disturbance in gait. No tremor.  Skin: Intact, no ulceration, erythema , scaling or rash noted. Pigmentation normal throughout  Psych; Normal mood and affect. Judgement and concentration normal   Assessment & Plan:  Toe pain, right 5th toe x 1 month,  refer Podiatry  Encounter for Medicare annual examination with abnormal findings Annual exam as documented. Counseling done  re healthy lifestyle involving commitment to 150 minutes exercise per week, heart healthy diet, and attaining healthy weight.The importance of adequate sleep also discussed. Regular seat belt use and home safety, is also discussed. Changes in health  habits are decided on by the patient with goals and time frames  set for achieving them. Immunization and cancer screening needs are specifically addressed at this visit.   Essential hypertension Uncontrolled due to non compliance, resume clonidine and lisinopril/HCTZ DASH diet and commitment to daily physical activity for a minimum of 30 minutes discussed and encouraged, as a part of hypertension management. The importance of attaining a healthy weight is also discussed.     01/11/2022    9:14 AM 01/11/2022    8:26 AM 01/11/2022    8:24 AM 11/04/2021    8:57 AM 10/12/2021    9:18 AM 08/19/2021    4:23 PM 07/21/2021    3:40 PM  BP/Weight  Systolic BP 166 170 171 114 159 163   Diastolic BP 80 78 97 68 98 80   Wt. (Lbs)   176.12 177.08 176  176  BMI   26.78 kg/m2 26.92 kg/m2 26.76 kg/m2  26.76 kg/m2       Diabetes mellitus type 2 in obese [facility]) John [name] is reminded of the importance of commitment to daily physical activity for 30 minutes or more, as able and the need to limit carbohydrate intake to 30 to 60 grams per meal to help with blood sugar control.   The need to take medication as prescribed, test blood sugar as directed, and to call between visits if there is a concern that blood sugar is uncontrolled is also discussed.   John [name] is reminded of the importance of  daily foot exam, annual eye examination, and good blood sugar, blood pressure and cholesterol control.     Latest Ref Rng & Units 11/04/2021    9:44 AM 05/07/2021   11:08 AM 01/08/2021   10:17 AM 09/29/2020   11:36 AM 09/29/2020   10:59 AM  Diabetic Labs  HbA1c 4.8 - 5.6 % 5.9  6.0    8.9   Micro/Creat Ratio 0 - 29 mg/g creat 149       Chol 100 - 199 mg/dL 382  505  397  673    HDL >39 mg/dL 61  52  43  43    Calc LDL 0 - 99 mg/dL 419  379  024  097    Triglycerides 0 - 149 mg/dL 47  353  299  242    Creatinine 0.76 - 1.27 mg/dL 6.83  4.19  6.22  2.97        01/11/2022    9:14 AM 01/11/2022    8:26 AM  01/11/2022    8:24 AM 11/04/2021    8:57 AM 10/12/2021    9:18 AM 08/19/2021    4:23 PM 07/21/2021    3:40 PM  BP/Weight  Systolic BP 166 170 171 114 159 163   Diastolic BP 80 78 97 68 98 80   Wt. (Lbs)   176.12 177.08 176  176  BMI   26.78 kg/m2 26.92 kg/m2 26.76 kg/m2  26.76 kg/m2      Latest Ref Rng & Units 06/17/2021   12:00 AM 07/09/2019    3:59 PM  Foot/eye exam completion dates  Eye Exam No Retinopathy No Retinopathy     No Retinopathy         This result is from an external source.      Not taking meds, random blood glucose in office good Updated lab needed at/ before next visit.

## 2022-01-11 NOTE — Assessment & Plan Note (Addendum)
5th toe x 1 month,  refer Podiatry

## 2022-01-21 ENCOUNTER — Other Ambulatory Visit (HOSPITAL_COMMUNITY): Payer: Self-pay | Admitting: Psychiatry

## 2022-01-21 DIAGNOSIS — F322 Major depressive disorder, single episode, severe without psychotic features: Secondary | ICD-10-CM

## 2022-01-21 NOTE — Telephone Encounter (Signed)
Call for appt

## 2022-01-26 NOTE — Telephone Encounter (Signed)
Called number on file and was not able to reach patient. Pre recorded message stated that call can not be completed at this time.

## 2022-02-16 ENCOUNTER — Telehealth: Payer: Self-pay

## 2022-02-16 NOTE — Telephone Encounter (Signed)
Patient needs to change referral from [facility] to [facility] in [place].

## 2022-02-17 ENCOUNTER — Other Ambulatory Visit: Payer: Self-pay | Admitting: Family Medicine

## 2022-02-17 DIAGNOSIS — M79674 Pain in right toe(s): Secondary | ICD-10-CM

## 2022-02-17 NOTE — Telephone Encounter (Signed)
Referral entered  

## 2022-02-18 ENCOUNTER — Other Ambulatory Visit (HOSPITAL_COMMUNITY): Payer: Self-pay | Admitting: Psychiatry

## 2022-02-18 DIAGNOSIS — F322 Major depressive disorder, single episode, severe without psychotic features: Secondary | ICD-10-CM

## 2022-02-22 ENCOUNTER — Other Ambulatory Visit (HOSPITAL_COMMUNITY): Payer: Self-pay | Admitting: Psychiatry

## 2022-02-22 ENCOUNTER — Ambulatory Visit: Payer: Medicare Other | Admitting: Family Medicine

## 2022-02-22 DIAGNOSIS — F322 Major depressive disorder, single episode, severe without psychotic features: Secondary | ICD-10-CM

## 2022-02-22 NOTE — Telephone Encounter (Signed)
Call for appt

## 2022-02-23 ENCOUNTER — Encounter: Payer: Self-pay | Admitting: Family Medicine

## 2022-03-15 ENCOUNTER — Encounter (HOSPITAL_COMMUNITY): Payer: Self-pay | Admitting: Psychiatry

## 2022-03-15 ENCOUNTER — Telehealth (INDEPENDENT_AMBULATORY_CARE_PROVIDER_SITE_OTHER): Payer: Medicare Other | Admitting: Psychiatry

## 2022-03-15 DIAGNOSIS — F322 Major depressive disorder, single episode, severe without psychotic features: Secondary | ICD-10-CM | POA: Diagnosis not present

## 2022-03-15 MED ORDER — ALPRAZOLAM 1 MG PO TABS: 1.0000 mg | ORAL_TABLET | Freq: Two times a day (BID) | ORAL | 0 refills | Status: DC

## 2022-03-15 MED ORDER — TRAZODONE HCL 150 MG PO TABS: 150.0000 mg | ORAL_TABLET | Freq: Every day | ORAL | 2 refills | Status: DC

## 2022-03-15 NOTE — Progress Notes (Signed)
BH MD/PA/NP OP Progress Note  03/15/2022 2:08 PM BESIM CHERRY  MRN:  [id number]  Chief Complaint:  Chief Complaint  Patient presents with   Depression   Anxiety   Follow-up   HPI: This patient is a 61 year old separated black male who is now living in [place].  He had 1 son who died at age 26 in 50.  He is on disability.  The patient returns for follow-up after about 8 months.  He states in general he is doing okay.  He feels a lot better since he and his wife split up last year.  He claims that he is not sleeping well because he ran out of the Xanax.  As usual he is not very compliant with follow-up appointments.  His blood pressure was somewhat high today because he is also run out of most of his blood pressure pills and only takes them sporadically I explained that this will not work.  He denies being significantly depressed or anxious but does have trouble sleeping without the Xanax.  The trazodone by itself does not work. Visit Diagnosis:    ICD-10-CM   1. Major depressive disorder, single episode, severe without psychotic features (Regino Ramirez)  F32.2 ALPRAZolam (XANAX) 1 MG tablet      Past Psychiatric History: none  Past Medical History:  Past Medical History:  Diagnosis Date   Anxiety    Depression 2007   Diabetes mellitus without complication ([place])    Helicobacter pylori ab+ 01/21/2019   Dx in 12/2018, will treat   Hyperlipidemia    Hypertension    Hypogonadism male    Obesity (BMI 30.0-34.9) 03/01/2017    Past Surgical History:  Procedure Laterality Date   CIRCUMCISION N/A 06/10/2019   Procedure: CIRCUMCISION ADULT;  Surgeon: Cleon Gustin, MD;  Location: [facility];  Service: Urology;  Laterality: N/A;  30 MINS   COLONOSCOPY  01/20/2011   Dr. Oneida Alar: large internal hemorrhoids   COLONOSCOPY WITH PROPOFOL N/A 09/21/2020   Procedure: COLONOSCOPY WITH PROPOFOL;  Surgeon: Eloise Harman, DO;  Location: [facility];  Service: Endoscopy;  Laterality: N/A;  AM-diabetic    HERNIA REPAIR     ventral hernia repair    VASECTOMY      Family Psychiatric History: See below  Family History:  Family History  Problem Relation Age of Onset   Heart failure Mother        living    Hypertension Mother    Hyperlipidemia Mother    Heart disease Mother    Hypertension Sister    Hypertension Brother    Colon cancer Neg Hx     Social History:  Social History   Socioeconomic History   Marital status: Married    Spouse name: Not on file   Number of children: 1   Years of education: Not on file   Highest education level: Not on file  Occupational History   Occupation: Production assistant, radio  Tobacco Use   Smoking status: Never   Smokeless tobacco: Never  Vaping Use   Vaping Use: Never used  Substance and Sexual Activity   Alcohol use: Yes    Alcohol/week: 1.0 standard drink of alcohol    Types: 1 Cans of beer per week    Comment: occasional beer socially.    Drug use: No   Sexual activity: Yes  Other Topics Concern   Not on file  Social History Narrative   Not on file   Social Determinants of Health   Financial  Resource Strain: Low Risk  (06/09/2021)   Overall Financial Resource Strain (CARDIA)    Difficulty of Paying Living Expenses: Not hard at all  Food Insecurity: No Food Insecurity (06/09/2021)   Hunger Vital Sign    Worried About Running Out of Food in the Last Year: Never true    Ran Out of Food in the Last Year: Never true  Transportation Needs: No Transportation Needs (06/09/2021)   PRAPARE - Administrator, Civil Service (Medical): No    Lack of Transportation (Non-Medical): No  Physical Activity: Sufficiently Active (06/09/2021)   Exercise Vital Sign    Days of Exercise per Week: 7 days    Minutes of Exercise per Session: 40 min  Stress: No Stress Concern Present (06/09/2021)   [employer] of Occupational Health - Occupational Stress Questionnaire    Feeling of Stress : Not at all  Social Connections: Moderately  Integrated (06/09/2021)   Social Connection and Isolation Panel [NHANES]    Frequency of Communication with Friends and Family: More than three times a week    Frequency of Social Gatherings with Friends and Family: More than three times a week    Attends Religious Services: More than 4 times per year    Active Member of [employer] or Organizations: No    Attends Banker Meetings: Never    Marital Status: Married    Allergies:  Allergies  Allergen Reactions   Januvia [Sitagliptin] Nausea And Vomiting   Poison Oak Extract [Poison Oak Extract] Dermatitis    Metabolic Disorder Labs: Lab Results  Component Value Date   HGBA1C 5.9 (H) 11/04/2021   MPG 114.02 06/10/2019   MPG 140 02/26/2019   No results found for: "PROLACTIN" Lab Results  Component Value Date   CHOL 174 11/04/2021   TRIG 47 11/04/2021   HDL 61 11/04/2021   CHOLHDL 2.9 11/04/2021   VLDL 64 (H) 12/30/2016   LDLCALC 103 (H) 11/04/2021   LDLCALC 131 (H) 05/07/2021   Lab Results  Component Value Date   TSH 1.830 11/04/2021   TSH 1.850 07/03/2020    Therapeutic Level Labs: No results found for: "LITHIUM" No results found for: "VALPROATE" No results found for: "CBMZ"  Current Medications: Current Outpatient Medications  Medication Sig Dispense Refill   ACCU-CHEK SOFTCLIX LANCETS lancets 100 each by Other route 4 (four) times daily. Use as instructed 100 each 5   ALPRAZolam (XANAX) 1 MG tablet Take 1 tablet (1 mg total) by mouth 2 (two) times daily. 60 tablet 0   Blood Glucose Monitoring Suppl (ACCU-CHEK AVIVA) device 1 each by Other route 2 (two) times daily. Use as instructed bid. E11.65 1 each 0   clobetasol cream (TEMOVATE) 0.05 % Apply 1 application. topically 2 (two) times daily. 30 g 1   cloNIDine (CATAPRES) 0.2 MG tablet Take 1 tablet (0.2 mg total) by mouth daily. 30 tablet 3   glucose blood (ONETOUCH VERIO) test strip Use as instructed to test blood glucose twice daily. ONE TOUCH VERIO FLEX  test strips 100 each 12   lisinopril-hydrochlorothiazide (ZESTORETIC) 20-25 MG tablet Take 1 tablet by mouth daily. 30 tablet 3   terbinafine (LAMISIL) 250 MG tablet Take 1 tablet (250 mg total) by mouth daily. 42 tablet 0   traZODone (DESYREL) 150 MG tablet Take 1 tablet (150 mg total) by mouth at bedtime. 30 tablet 2   triamcinolone cream (KENALOG) 0.1 % Apply 1 application. topically 2 (two) times daily. 80 g 1   No  current facility-administered medications for this visit.     Musculoskeletal: Strength & Muscle Tone: within normal limits Gait & Station: normal Patient leans: N/A  Psychiatric Specialty Exam: Review of Systems  Psychiatric/Behavioral:  Positive for sleep disturbance.   All other systems reviewed and are negative.   Blood pressure (!) 151/77, pulse 89, height 5\' 8"  (1.727 m), weight 180 lb (81.6 kg), SpO2 99 %.Body mass index is 27.37 kg/m.  General Appearance: Casual and Fairly Groomed  Eye Contact:  Good  Speech:  Clear and Coherent  Volume:  Normal  Mood:  Euthymic  Affect:  Appropriate and Congruent  Thought Process:  Goal Directed  Orientation:  Full (Time, Place, and Person)  Thought Content: Logical   Suicidal Thoughts:  No  Homicidal Thoughts:  No  Memory:  Immediate;   Good Recent;   Fair Remote;   NA  Judgement:  Poor  Insight:  Lacking  Psychomotor Activity:  Normal  Concentration:  Concentration: Fair and Attention Span: Fair  Recall:  [employer] of Knowledge: Fair  Language: Good  Akathisia:  No  Handed:  Right  AIMS (if indicated): not done  Assets:  Communication Skills Desire for Improvement Resilience Social Support  ADL's:  Intact  Cognition: Impaired,  Mild  Sleep:  Poor   Screenings: PHQ2-9    Flowsheet Row Video Visit from 03/15/2022 in [place] [facility] Visit from 01/11/2022 in [facility] Office Visit from 11/04/2021 in [facility] Video Visit from  08/11/2021 in [place] [facility] Visit from 07/14/2021 in [facility]  PHQ-2 Total Score 0 0 0 0 0      Flowsheet Row Video Visit from 03/15/2022 in [place] [facility] from 10/12/2021 in [facility] from 08/19/2021 in [facility] [place] No Risk No Risk No Risk        Assessment and Plan:  This patient is a 61 year old male with a history of depression anxiety and mild cognitive impairment.  He denies being depressed and no longer takes antidepressants.  He would like to continue the Xanax 1 mg twice daily which helps anxiety and sleep and continue trazodone 75 mg at bedtime for sleep.  He will return to see me in 3 months Collaboration of Care: Collaboration of Care: Primary Care Provider AEB notes are shared with PCP through the epic system  Patient/Guardian was advised Release of Information must be obtained prior to any record release in order to collaborate their care with an outside provider. Patient/Guardian was advised if they have not already done so to contact the registration department to sign all necessary forms in order for [place] to release information regarding their care.   Consent: Patient/Guardian gives verbal consent for treatment and assignment of benefits for services provided during this visit. Patient/Guardian expressed understanding and agreed to proceed.    Levonne Spiller, MD 03/15/2022, 2:08 PM

## 2022-03-17 ENCOUNTER — Ambulatory Visit: Payer: Medicare Other | Admitting: Podiatry

## 2022-04-07 ENCOUNTER — Ambulatory Visit (INDEPENDENT_AMBULATORY_CARE_PROVIDER_SITE_OTHER): Payer: Medicare Other | Admitting: Family Medicine

## 2022-04-07 ENCOUNTER — Encounter: Payer: Self-pay | Admitting: Family Medicine

## 2022-04-07 VITALS — BP 124/80 | HR 73 | Ht 68.0 in | Wt 180.1 lb

## 2022-04-07 DIAGNOSIS — E1169 Type 2 diabetes mellitus with other specified complication: Secondary | ICD-10-CM | POA: Diagnosis not present

## 2022-04-07 DIAGNOSIS — Z23 Encounter for immunization: Secondary | ICD-10-CM

## 2022-04-07 DIAGNOSIS — I10 Essential (primary) hypertension: Secondary | ICD-10-CM

## 2022-04-07 DIAGNOSIS — E782 Mixed hyperlipidemia: Secondary | ICD-10-CM

## 2022-04-07 DIAGNOSIS — G4700 Insomnia, unspecified: Secondary | ICD-10-CM

## 2022-04-07 DIAGNOSIS — E663 Overweight: Secondary | ICD-10-CM

## 2022-04-07 DIAGNOSIS — M25511 Pain in right shoulder: Secondary | ICD-10-CM | POA: Diagnosis not present

## 2022-04-07 MED ORDER — LISINOPRIL-HYDROCHLOROTHIAZIDE 20-25 MG PO TABS: 1.0000 | ORAL_TABLET | Freq: Every day | ORAL | 3 refills | Status: DC

## 2022-04-07 MED ORDER — KETOROLAC TROMETHAMINE 60 MG/2ML IM SOLN
60.0000 mg | Freq: Once | INTRAMUSCULAR | Status: AC
  Administered 2022-04-07: 60 mg via INTRAMUSCULAR

## 2022-04-07 MED ORDER — PREDNISONE 10 MG PO TABS: 10.0000 mg | ORAL_TABLET | Freq: Two times a day (BID) | ORAL | 0 refills | Status: DC

## 2022-04-07 NOTE — Patient Instructions (Signed)
Follow-up in 4 months call if you need me sooner.  Flu vaccine today.  Excellent blood pressure no change in medication.  Toradol 60 mg IM for right shoulder pain and 5 day course of prednisone is prescribed  Please get blood work today already ordered this is due.  You need current COVID-vaccine which is available at the pharmacy please get this.  You need your second shingles shot this is past due please get this at your pharmacy.  Thanks for choosing [facility], we consider it a privelige to serve you.

## 2022-04-09 LAB — CMP14+EGFR
ALT: 13 IU/L (ref 0–44)
AST: 18 IU/L (ref 0–40)
Albumin/Globulin Ratio: 1.4 (ref 1.2–2.2)
Albumin: 3.9 g/dL (ref 3.9–4.9)
Alkaline Phosphatase: 92 IU/L (ref 44–121)
BUN/Creatinine Ratio: 15 (ref 10–24)
BUN: 18 mg/dL (ref 8–27)
Bilirubin Total: 0.3 mg/dL (ref 0.0–1.2)
CO2: 21 mmol/L (ref 20–29)
Calcium: 9.2 mg/dL (ref 8.6–10.2)
Chloride: 99 mmol/L (ref 96–106)
Creatinine, Ser: 1.17 mg/dL (ref 0.76–1.27)
Globulin, Total: 2.8 g/dL (ref 1.5–4.5)
Glucose: 111 mg/dL — ABNORMAL HIGH (ref 70–99)
Potassium: 4.4 mmol/L (ref 3.5–5.2)
Sodium: 136 mmol/L (ref 134–144)
Total Protein: 6.7 g/dL (ref 6.0–8.5)
eGFR: 70 mL/min/{1.73_m2} (ref >59)

## 2022-04-09 LAB — LIPID PANEL
Chol/HDL Ratio: 5.1 ratio — ABNORMAL HIGH (ref 0.0–5.0)
Cholesterol, Total: 210 mg/dL — ABNORMAL HIGH (ref 100–199)
HDL: 41 mg/dL (ref >39)
LDL Chol Calc (NIH): 146 mg/dL — ABNORMAL HIGH (ref 0–99)
Triglycerides: 125 mg/dL (ref 0–149)
VLDL Cholesterol Cal: 23 mg/dL (ref 5–40)

## 2022-04-09 LAB — HEMOGLOBIN A1C
Est. average glucose Bld gHb Est-mCnc: 123 mg/dL
Hgb A1c MFr Bld: 5.9 % — ABNORMAL HIGH (ref 4.8–5.6)

## 2022-04-10 ENCOUNTER — Encounter: Payer: Self-pay | Admitting: Family Medicine

## 2022-04-10 DIAGNOSIS — M25511 Pain in right shoulder: Secondary | ICD-10-CM | POA: Insufficient documentation

## 2022-04-10 MED ORDER — ROSUVASTATIN CALCIUM 20 MG PO TABS: 20.0000 mg | ORAL_TABLET | Freq: Every day | ORAL | 5 refills | Status: DC

## 2022-04-10 NOTE — Assessment & Plan Note (Signed)
Hyperlipidemia:Low fat diet discussed and encouraged.   Lipid Panel  Lab Results  Component Value Date   CHOL 210 (H) 04/07/2022   HDL 41 04/07/2022   LDLCALC 146 (H) 04/07/2022   TRIG 125 04/07/2022   CHOLHDL 5.1 (H) 04/07/2022     Needs to start medication

## 2022-04-10 NOTE — Progress Notes (Signed)
John [name]     MRN: [id number]      DOB: [birth year]   HPI John [name] is here for follow up and re-evaluation of chronic medical conditions, medication management and review of any available recent lab and radiology data.  Preventive health is updated, specifically  Cancer screening and Immunization.   Questions or concerns regarding consultations or procedures which the PT has had in the interim are  addressed. The PT denies any adverse reactions to current medications since the last visit.  Acute onset right shoulder pain with reduced mobility, no inciting trauma  ROS Denies recent fever or chills. Denies sinus pressure, nasal congestion, ear pain or sore throat. Denies chest congestion, productive cough or wheezing. Denies chest pains, palpitations and leg swelling Denies abdominal pain, nausea, vomiting,diarrhea or constipation.   Denies dysuria, frequency, hesitancy or incontinence. Denies headaches, seizures, numbness, or tingling. Denies depression, anxiety or insomnia. Denies skin break down or rash.   PE  BP 124/80   Pulse 73   Ht 5\' 8"  (1.727 m)   Wt 180 lb 1.3 oz (81.7 kg)   SpO2 95%   BMI 27.38 kg/m   Patient alert and oriented and in no cardiopulmonary distress.  HEENT: No facial asymmetry, EOMI,     Neck supple .  Chest: Clear to auscultation bilaterally.  CVS: S1, S2 no murmurs, no S3.Regular rate.  ABD: Soft non tender.   Ext: No edema  MS: Adequate ROM spine,  hips and knees.Decreased ROM right shoulder  Skin: Intact, no ulcerations or rash noted.  Psych: Good eye contact, normal affect. Memory intact not anxious or depressed appearing.  CNS: CN 2-12 intact, power,  normal throughout.no focal deficits noted.   Assessment & Plan  Acute pain of right shoulder toradol 60 mg IM followed by 5 day course of prednisone  Essential hypertension Controlled, no change in medication DASH diet and commitment to daily physical activity for a  minimum of 30 minutes discussed and encouraged, as a part of hypertension management. The importance of attaining a healthy weight is also discussed.     04/07/2022   10:55 AM 04/07/2022   10:33 AM 03/15/2022    1:53 PM 03/15/2022    1:51 PM 01/11/2022    9:14 AM 01/11/2022    8:26 AM 01/11/2022    8:24 AM  BP/Weight  Systolic BP 253 664   403 474 259  Diastolic BP 80 73   80 78 97  Wt. (Lbs)  180.08     176.12  BMI  27.38 kg/m2     26.78 kg/m2     Information is confidential and restricted. Go to Review Flowsheets to unlock data.       Mixed hyperlipidemia Hyperlipidemia:Low fat diet discussed and encouraged.   Lipid Panel  Lab Results  Component Value Date   CHOL 210 (H) 04/07/2022   HDL 41 04/07/2022   LDLCALC 146 (H) 04/07/2022   TRIG 125 04/07/2022   CHOLHDL 5.1 (H) 04/07/2022     Needs to start medication  Overweight (BMI 25.0-29.9)  Patient re-educated about  the importance of commitment to a  minimum of 150 minutes of exercise per week as able.  The importance of healthy food choices with portion control discussed, as well as eating regularly and within a 12 hour window most days. The need to choose "clean , green" food 50 to 75% of the time is discussed, as well as to make water the primary drink and set a  goal of 64 ounces water daily.       04/07/2022   10:33 AM 03/15/2022    1:51 PM 01/11/2022    8:24 AM  Weight /BMI  Weight 180 lb 1.3 oz  176 lb 1.9 oz  Height 5\' 8"  (1.727 m)  5\' 8"  (1.727 m)  BMI 27.38 kg/m2  26.78 kg/m2     Information is confidential and restricted. Go to Review Flowsheets to unlock data.      Insomnia Controlled and managed by Psych

## 2022-04-10 NOTE — Assessment & Plan Note (Signed)
  Patient re-educated about  the importance of commitment to a  minimum of 150 minutes of exercise per week as able.  The importance of healthy food choices with portion control discussed, as well as eating regularly and within a 12 hour window most days. The need to choose "clean , green" food 50 to 75% of the time is discussed, as well as to make water the primary drink and set a goal of 64 ounces water daily.       04/07/2022   10:33 AM 03/15/2022    1:51 PM 01/11/2022    8:24 AM  Weight /BMI  Weight 180 lb 1.3 oz  176 lb 1.9 oz  Height 5\' 8"  (1.727 m)  5\' 8"  (1.727 m)  BMI 27.38 kg/m2  26.78 kg/m2     Information is confidential and restricted. Go to Review Flowsheets to unlock data.

## 2022-04-10 NOTE — Assessment & Plan Note (Signed)
toradol 60 mg IM followed by 5 day course of prednisone

## 2022-04-10 NOTE — Assessment & Plan Note (Signed)
Controlled and managed by Psych 

## 2022-04-10 NOTE — Assessment & Plan Note (Signed)
Controlled, no change in medication DASH diet and commitment to daily physical activity for a minimum of 30 minutes discussed and encouraged, as a part of hypertension management. The importance of attaining a healthy weight is also discussed.     04/07/2022   10:55 AM 04/07/2022   10:33 AM 03/15/2022    1:53 PM 03/15/2022    1:51 PM 01/11/2022    9:14 AM 01/11/2022    8:26 AM 01/11/2022    8:24 AM  BP/Weight  Systolic BP 465 035   465 681 275  Diastolic BP 80 73   80 78 97  Wt. (Lbs)  180.08     176.12  BMI  27.38 kg/m2     26.78 kg/m2     Information is confidential and restricted. Go to Review Flowsheets to unlock data.

## 2022-04-12 ENCOUNTER — Other Ambulatory Visit (HOSPITAL_COMMUNITY): Payer: Self-pay | Admitting: Psychiatry

## 2022-04-12 DIAGNOSIS — F322 Major depressive disorder, single episode, severe without psychotic features: Secondary | ICD-10-CM

## 2022-05-31 ENCOUNTER — Other Ambulatory Visit: Payer: Self-pay | Admitting: Family Medicine

## 2022-05-31 NOTE — Telephone Encounter (Signed)
Ok to send

## 2022-06-03 ENCOUNTER — Telehealth (HOSPITAL_COMMUNITY): Payer: Medicare Other | Admitting: Psychiatry

## 2022-06-07 ENCOUNTER — Other Ambulatory Visit: Payer: Self-pay | Admitting: Family Medicine

## 2022-07-12 ENCOUNTER — Other Ambulatory Visit (HOSPITAL_COMMUNITY): Payer: Self-pay | Admitting: Psychiatry

## 2022-07-12 DIAGNOSIS — F322 Major depressive disorder, single episode, severe without psychotic features: Secondary | ICD-10-CM

## 2022-07-12 NOTE — Telephone Encounter (Signed)
Call for appt

## 2022-08-09 ENCOUNTER — Encounter: Payer: Self-pay | Admitting: Family Medicine

## 2022-08-09 ENCOUNTER — Ambulatory Visit (INDEPENDENT_AMBULATORY_CARE_PROVIDER_SITE_OTHER): Payer: Medicare Other | Admitting: Family Medicine

## 2022-08-09 VITALS — BP 180/90 | HR 105 | Ht 68.0 in | Wt 181.0 lb

## 2022-08-09 DIAGNOSIS — I10 Essential (primary) hypertension: Secondary | ICD-10-CM

## 2022-08-09 DIAGNOSIS — E669 Obesity, unspecified: Secondary | ICD-10-CM

## 2022-08-09 DIAGNOSIS — E782 Mixed hyperlipidemia: Secondary | ICD-10-CM | POA: Diagnosis not present

## 2022-08-09 DIAGNOSIS — E1169 Type 2 diabetes mellitus with other specified complication: Secondary | ICD-10-CM

## 2022-08-09 DIAGNOSIS — E1159 Type 2 diabetes mellitus with other circulatory complications: Secondary | ICD-10-CM

## 2022-08-09 DIAGNOSIS — Z139 Encounter for screening, unspecified: Secondary | ICD-10-CM | POA: Diagnosis not present

## 2022-08-09 DIAGNOSIS — E1165 Type 2 diabetes mellitus with hyperglycemia: Secondary | ICD-10-CM | POA: Diagnosis not present

## 2022-08-09 DIAGNOSIS — G4700 Insomnia, unspecified: Secondary | ICD-10-CM | POA: Diagnosis not present

## 2022-08-09 DIAGNOSIS — E663 Overweight: Secondary | ICD-10-CM

## 2022-08-09 MED ORDER — TETANUS-DIPHTH-ACELL PERTUSSIS 5-2.5-18.5 LF-MCG/0.5 IM SUSP: 0.5000 mL | Freq: Once | INTRAMUSCULAR | 0 refills | Status: AC

## 2022-08-09 NOTE — Progress Notes (Signed)
RHAKEEM VYAS     MRN: [id number]      DOB: October 18, 1960   HPI Mr. Maroun is here for follow up and re-evaluation of chronic medical conditions, medication management and review of any available recent lab and radiology data.  Preventive health is updated, specifically  Cancer screening and Immunization.   Questions or concerns regarding consultations or procedures which the PT has had in the interim are  addressed. The PT denies any adverse reactions to current medications since the last visit. Takes BP meds approx 50% of the time, no reason why he is inconsistent  Denies polyuria, polydipsia, blurred vision , or hypoglycemic episodes.  There are no new concerns.  There are no specific complaints   ROS Denies recent fever or chills. Denies sinus pressure, nasal congestion, ear pain or sore throat. Denies chest congestion, productive cough or wheezing. Denies chest pains, palpitations and leg swelling Denies abdominal pain, nausea, vomiting,diarrhea or constipation.   Denies dysuria, frequency, hesitancy or incontinence. Denies joint pain, swelling and limitation in mobility. Denies headaches, seizures, numbness, or tingling. Denies depression, anxiety or insomnia. Denies skin break down or rash.   PE  BP (!) 180/90   Pulse (!) 105   Ht 5' 8"$  (1.727 m)   Wt 181 lb (82.1 kg)   SpO2 97%   BMI 27.52 kg/m   Patient alert and oriented and in no cardiopulmonary distress.  HEENT: No facial asymmetry, EOMI,     Neck supple .  Chest: Clear to auscultation bilaterally.  CVS: S1, S2 no murmurs, no S3.Regular rate.  ABD: Soft non tender.   Ext: No edema  MS: Adequate ROM spine, shoulders, hips and knees.  Skin: Intact, no ulcerations or rash noted.  Psych: Good eye contact, normal affect. Memory intact not anxious or depressed appearing.  CNS: CN 2-12 intact, power,  normal throughout.no focal deficits noted.   Assessment & Plan  Essential hypertension Uncontrolled  due to non compliance , re asess once pt has been compliant for a minimum of 4 weeks DASH diet and commitment to daily physical activity for a minimum of 30 minutes discussed and encouraged, as a part of hypertension management. The importance of attaining a healthy weight is also discussed.     08/09/2022   11:07 AM 08/09/2022   10:38 AM 08/09/2022   10:36 AM 04/07/2022   10:55 AM 04/07/2022   10:33 AM 03/15/2022    1:53 PM 03/15/2022    1:51 PM  BP/Weight  Systolic BP 99991111 Q000111Q 123XX123 [id number]    Diastolic BP 90 77 90 80 73    Wt. (Lbs)   181  180.08    BMI   27.52 kg/m2  27.38 kg/m2       Information is confidential and restricted. Go to Review Flowsheets to unlock data.       Diabetes mellitus type 2 in obese [facility]) Mr. Loduca is reminded of the importance of commitment to daily physical activity for 30 minutes or more, as able and the need to limit carbohydrate intake to 30 to 60 grams per meal to help with blood sugar control.      Mr. Schipp is reminded of the importance of daily foot exam, annual eye examination, and good blood sugar, blood pressure and cholesterol control.     Latest Ref Rng & Units 04/07/2022   11:34 AM 11/04/2021    9:44 AM 05/07/2021   11:08 AM 01/08/2021   10:17 AM 09/29/2020   11:36  AM  Diabetic Labs  HbA1c 4.8 - 5.6 % 5.9  5.9  6.0     Micro/Creat Ratio 0 - 29 mg/g creat  149      Chol 100 - 199 mg/dL 210  174  205  171  211   HDL >39 mg/dL 41  61  52  43  43   Calc LDL 0 - 99 mg/dL 146  103  131  104  146   Triglycerides 0 - 149 mg/dL 125  47  123  133  124   Creatinine 0.76 - 1.27 mg/dL 1.17  1.13  1.07  1.13  1.19       08/09/2022   11:07 AM 08/09/2022   10:38 AM 08/09/2022   10:36 AM 04/07/2022   10:55 AM 04/07/2022   10:33 AM 03/15/2022    1:53 PM 03/15/2022    1:51 PM  BP/Weight  Systolic BP 99991111 Q000111Q 123XX123 [id number]    Diastolic BP 90 77 90 80 73    Wt. (Lbs)   181  180.08    BMI   27.52 kg/m2  27.38 kg/m2       Information is  confidential and restricted. Go to Review Flowsheets to unlock data.      Latest Ref Rng & Units 06/17/2021   12:00 AM 07/09/2019    3:59 PM  Foot/eye exam completion dates  Eye Exam No Retinopathy No Retinopathy     No Retinopathy         This result is from an external source.      Updated lab needed at/ before next visit.   Overweight (BMI 25.0-29.9)  Patient re-educated about  the importance of commitment to a  minimum of 150 minutes of exercise per week as able.  The importance of healthy food choices with portion control discussed, as well as eating regularly and within a 12 hour window most days. The need to choose "clean , green" food 50 to 75% of the time is discussed, as well as to make water the primary drink and set a goal of 64 ounces water daily.       08/09/2022   10:36 AM 04/07/2022   10:33 AM 03/15/2022    1:51 PM  Weight /BMI  Weight 181 lb 180 lb 1.3 oz   Height 5' 8"$  (1.727 m) 5' 8"$  (1.727 m)   BMI 27.52 kg/m2 27.38 kg/m2      Information is confidential and restricted. Go to Review Flowsheets to unlock data.      Mixed hyperlipidemia Hyperlipidemia:Low fat diet discussed and encouraged.   Lipid Panel  Lab Results  Component Value Date   CHOL 210 (H) 04/07/2022   HDL 41 04/07/2022   LDLCALC 146 (H) 04/07/2022   TRIG 125 04/07/2022   CHOLHDL 5.1 (H) 04/07/2022     Updated lab needed at/ before next visit. Not cotrolled  Insomnia Reports needs half tablet instead of one whole tab  for adequate sleep,I advised to d/w Prescriber to reflect dose he needs

## 2022-08-09 NOTE — Patient Instructions (Addendum)
F/U with EKG in office in 3 to 4 weeks, re evaluate blood pressure  PLEASE bring pills  to next visit.  Nurse pls gioive rx for shingrix #2 and TdAP  Blood pressure is HIGH, need to take medication every day at the same time ( Lisinopril/ HCTZ 20 /25 one daily)  Labs today, lipid, cmp and eGFr, HBA1C, PSA  It is important that you exercise regularly at least 30 minutes 5 times a week. If you develop chest pain, have severe difficulty breathing, or feel very tired, stop exercising immediately and seek medical attention   Thanks for choosing [facility], we consider it a privelige to serve you.

## 2022-08-09 NOTE — Assessment & Plan Note (Signed)
Hyperlipidemia:Low fat diet discussed and encouraged.   Lipid Panel  Lab Results  Component Value Date   CHOL 210 (H) 04/07/2022   HDL 41 04/07/2022   LDLCALC 146 (H) 04/07/2022   TRIG 125 04/07/2022   CHOLHDL 5.1 (H) 04/07/2022     Updated lab needed at/ before next visit. Not cotrolled

## 2022-08-09 NOTE — Assessment & Plan Note (Signed)
  Patient re-educated about  the importance of commitment to a  minimum of 150 minutes of exercise per week as able.  The importance of healthy food choices with portion control discussed, as well as eating regularly and within a 12 hour window most days. The need to choose "clean , green" food 50 to 75% of the time is discussed, as well as to make water the primary drink and set a goal of 64 ounces water daily.       08/09/2022   10:36 AM 04/07/2022   10:33 AM 03/15/2022    1:51 PM  Weight /BMI  Weight 181 lb 180 lb 1.3 oz   Height 5' 8"$  (1.727 m) 5' 8"$  (1.727 m)   BMI 27.52 kg/m2 27.38 kg/m2      Information is confidential and restricted. Go to Review Flowsheets to unlock data.

## 2022-08-09 NOTE — Assessment & Plan Note (Signed)
Mr. Wenberg is reminded of the importance of commitment to daily physical activity for 30 minutes or more, as able and the need to limit carbohydrate intake to 30 to 60 grams per meal to help with blood sugar control.      Mr. Klepinger is reminded of the importance of daily foot exam, annual eye examination, and good blood sugar, blood pressure and cholesterol control.     Latest Ref Rng & Units 04/07/2022   11:34 AM 11/04/2021    9:44 AM 05/07/2021   11:08 AM 01/08/2021   10:17 AM 09/29/2020   11:36 AM  Diabetic Labs  HbA1c 4.8 - 5.6 % 5.9  5.9  6.0     Micro/Creat Ratio 0 - 29 mg/g creat  149      Chol 100 - 199 mg/dL 210  174  205  171  211   HDL >39 mg/dL 41  61  52  43  43   Calc LDL 0 - 99 mg/dL 146  103  131  104  146   Triglycerides 0 - 149 mg/dL 125  47  123  133  124   Creatinine 0.76 - 1.27 mg/dL 1.17  1.13  1.07  1.13  1.19       08/09/2022   11:07 AM 08/09/2022   10:38 AM 08/09/2022   10:36 AM 04/07/2022   10:55 AM 04/07/2022   10:33 AM 03/15/2022    1:53 PM 03/15/2022    1:51 PM  BP/Weight  Systolic BP 99991111 Q000111Q 123XX123 [id number]    Diastolic BP 90 77 90 80 73    Wt. (Lbs)   181  180.08    BMI   27.52 kg/m2  27.38 kg/m2       Information is confidential and restricted. Go to Review Flowsheets to unlock data.      Latest Ref Rng & Units 06/17/2021   12:00 AM 07/09/2019    3:59 PM  Foot/eye exam completion dates  Eye Exam No Retinopathy No Retinopathy     No Retinopathy         This result is from an external source.      Updated lab needed at/ before next visit.

## 2022-08-09 NOTE — Assessment & Plan Note (Signed)
Uncontrolled due to non compliance , re asess once pt has been compliant for a minimum of 4 weeks DASH diet and commitment to daily physical activity for a minimum of 30 minutes discussed and encouraged, as a part of hypertension management. The importance of attaining a healthy weight is also discussed.     08/09/2022   11:07 AM 08/09/2022   10:38 AM 08/09/2022   10:36 AM 04/07/2022   10:55 AM 04/07/2022   10:33 AM 03/15/2022    1:53 PM 03/15/2022    1:51 PM  BP/Weight  Systolic BP 99991111 Q000111Q 123XX123 [id number]    Diastolic BP 90 77 90 80 73    Wt. (Lbs)   181  180.08    BMI   27.52 kg/m2  27.38 kg/m2       Information is confidential and restricted. Go to Review Flowsheets to unlock data.

## 2022-08-09 NOTE — Assessment & Plan Note (Signed)
Reports needs half tablet instead of one whole tab  for adequate sleep,I advised to d/w Prescriber to reflect dose he needs

## 2022-08-10 LAB — LIPID PANEL
Chol/HDL Ratio: 6.1 ratio — ABNORMAL HIGH (ref 0.0–5.0)
Cholesterol, Total: 255 mg/dL — ABNORMAL HIGH (ref 100–199)
HDL: 42 mg/dL (ref >39)
LDL Chol Calc (NIH): 184 mg/dL — ABNORMAL HIGH (ref 0–99)
Triglycerides: 155 mg/dL — ABNORMAL HIGH (ref 0–149)
VLDL Cholesterol Cal: 29 mg/dL (ref 5–40)

## 2022-08-10 LAB — CMP14+EGFR
ALT: 13 IU/L (ref 0–44)
AST: 13 IU/L (ref 0–40)
Albumin/Globulin Ratio: 1.3 (ref 1.2–2.2)
Albumin: 4.3 g/dL (ref 3.9–4.9)
Alkaline Phosphatase: 127 IU/L — ABNORMAL HIGH (ref 44–121)
BUN/Creatinine Ratio: 11 (ref 10–24)
BUN: 16 mg/dL (ref 8–27)
Bilirubin Total: 0.5 mg/dL (ref 0.0–1.2)
CO2: 22 mmol/L (ref 20–29)
Calcium: 10.3 mg/dL — ABNORMAL HIGH (ref 8.6–10.2)
Chloride: 91 mmol/L — ABNORMAL LOW (ref 96–106)
Creatinine, Ser: 1.49 mg/dL — ABNORMAL HIGH (ref 0.76–1.27)
Globulin, Total: 3.2 g/dL (ref 1.5–4.5)
Glucose: 398 mg/dL — ABNORMAL HIGH (ref 70–99)
Potassium: 4.2 mmol/L (ref 3.5–5.2)
Sodium: 131 mmol/L — ABNORMAL LOW (ref 134–144)
Total Protein: 7.5 g/dL (ref 6.0–8.5)
eGFR: 53 mL/min/{1.73_m2} — ABNORMAL LOW (ref >59)

## 2022-08-10 LAB — HEMOGLOBIN A1C
Est. average glucose Bld gHb Est-mCnc: 263 mg/dL
Hgb A1c MFr Bld: 10.8 % — ABNORMAL HIGH (ref 4.8–5.6)

## 2022-08-10 LAB — PSA, TOTAL AND FREE
PSA, Free Pct: 26.9 %
PSA, Free: 0.35 ng/mL
Prostate Specific Ag, Serum: 1.3 ng/mL (ref 0.0–4.0)

## 2022-08-12 MED ORDER — ROSUVASTATIN CALCIUM 40 MG PO TABS: 40.0000 mg | ORAL_TABLET | Freq: Every day | ORAL | 1 refills | Status: DC

## 2022-08-12 NOTE — Addendum Note (Signed)
Addended by: Fayrene Helper on: 08/12/2022 06:25 PM   Modules accepted: Orders

## 2022-08-13 ENCOUNTER — Other Ambulatory Visit (HOSPITAL_COMMUNITY): Payer: Self-pay | Admitting: Psychiatry

## 2022-08-13 DIAGNOSIS — F322 Major depressive disorder, single episode, severe without psychotic features: Secondary | ICD-10-CM

## 2022-08-13 MED ORDER — INSULIN GLARGINE 100 UNITS/ML SOLOSTAR PEN
25.0000 [IU] | PEN_INJECTOR | Freq: Every day | SUBCUTANEOUS | 5 refills | Status: DC
Start: 2022-08-13 — End: 2022-08-13

## 2022-08-13 MED ORDER — METFORMIN HCL 500 MG PO TABS: 500.0000 mg | ORAL_TABLET | Freq: Two times a day (BID) | ORAL | 5 refills | Status: DC

## 2022-08-13 MED ORDER — INSULIN GLARGINE 100 UNITS/ML SOLOSTAR PEN: 25.0000 [IU] | PEN_INJECTOR | Freq: Every day | SUBCUTANEOUS | 5 refills | Status: DC

## 2022-08-13 NOTE — Addendum Note (Signed)
Addended by: Fayrene Helper on: 08/13/2022 09:55 PM   Modules accepted: Orders

## 2022-08-13 NOTE — Addendum Note (Signed)
Addended by: Fayrene Helper on: 08/13/2022 10:03 PM   Modules accepted: Orders

## 2022-08-30 ENCOUNTER — Telehealth (HOSPITAL_COMMUNITY): Payer: Medicare Other | Admitting: Psychiatry

## 2022-09-06 ENCOUNTER — Telehealth (INDEPENDENT_AMBULATORY_CARE_PROVIDER_SITE_OTHER): Payer: Medicare Other | Admitting: Psychiatry

## 2022-09-06 ENCOUNTER — Encounter (HOSPITAL_COMMUNITY): Payer: Self-pay | Admitting: Psychiatry

## 2022-09-06 DIAGNOSIS — F322 Major depressive disorder, single episode, severe without psychotic features: Secondary | ICD-10-CM

## 2022-09-06 MED ORDER — ALPRAZOLAM 1 MG PO TABS: 1.0000 mg | ORAL_TABLET | Freq: Two times a day (BID) | ORAL | 3 refills | Status: DC

## 2022-09-06 NOTE — Progress Notes (Signed)
Virtual Visit via Telephone Note  I connected with [name] on 09/06/22 at  3:00 PM EDT by telephone and verified that I am speaking with the correct person using two identifiers.  Location: Patient: home Provider: office   I discussed the limitations, risks, security and privacy concerns of performing an evaluation and management service by telephone and the availability of in person appointments. I also discussed with the patient that there may be a patient responsible charge related to this service. The patient expressed understanding and agreed to proceed.      I discussed the assessment and treatment plan with the patient. The patient was provided an opportunity to ask questions and all were answered. The patient agreed with the plan and demonstrated an understanding of the instructions.   The patient was advised to call back or seek an in-person evaluation if the symptoms worsen or if the condition fails to improve as anticipated.  I provided 15 minutes of non-face-to-face time during this encounter.   Levonne Spiller, MD  [facility] MD/PA/NP OP Progress Note  09/06/2022 11:48 AM [name]  MRN:  [id number]  Chief Complaint:  Chief Complaint  Patient presents with   Depression   Anxiety   Follow-up   HPI: This patient is a 62 year old separated black male who is living in [place].  He had 1 son who died in 43 at age 70.  He is on disability.  The patient returns for follow-up after about 6 months.  He was recently found that his blood sugar was poorly controlled.  He was up to almost 400 and his glucose and his A1c was over 11.  His PCP has put him on insulin and metformin and he claims he is watching his diet more closely although he drinks Kool-Aid.  I adamantly told him that he cannot drink this and to switch to water.  In terms of mood he denies significant depression.  He has run out of his Xanax and has more difficulty sleeping without it.  Also "calms his nerves  during the day.  He is not using the trazodone because it makes him feel too groggy. Visit Diagnosis:    ICD-10-CM   1. Major depressive disorder, single episode, severe without psychotic features ([place])  F32.2 ALPRAZolam (XANAX) 1 MG tablet      Past Psychiatric History: none  Past Medical History:  Past Medical History:  Diagnosis Date   Anxiety    Depression 2007   Diabetes mellitus without complication ([place])    Helicobacter pylori ab+ 01/21/2019   Dx in 12/2018, will treat   Hyperlipidemia    Hypertension    Hypogonadism male    Obesity (BMI 30.0-34.9) 03/01/2017    Past Surgical History:  Procedure Laterality Date   CIRCUMCISION N/A 06/10/2019   Procedure: CIRCUMCISION ADULT;  Surgeon: Cleon Gustin, MD;  Location: [facility] ORS;  Service: Urology;  Laterality: N/A;  30 MINS   COLONOSCOPY  01/20/2011   Dr. Oneida Alar: large internal hemorrhoids   COLONOSCOPY WITH PROPOFOL N/A 09/21/2020   Procedure: COLONOSCOPY WITH PROPOFOL;  Surgeon: Eloise Harman, DO;  Location: [facility] ENDO SUITE;  Service: Endoscopy;  Laterality: N/A;  AM-diabetic   HERNIA REPAIR     ventral hernia repair    VASECTOMY      Family Psychiatric History: See below  Family History:  Family History  Problem Relation Age of Onset   Heart failure Mother        living  Hypertension Mother    Hyperlipidemia Mother    Heart disease Mother    Hypertension Sister    Hypertension Brother    Colon cancer Neg Hx     Social History:  Social History   Socioeconomic History   Marital status: Married    Spouse name: Not on file   Number of children: 1   Years of education: Not on file   Highest education level: Not on file  Occupational History   Occupation: Production assistant, radio  Tobacco Use   Smoking status: Never   Smokeless tobacco: Never  Vaping Use   Vaping Use: Never used  Substance and Sexual Activity   Alcohol use: Yes    Alcohol/week: 1.0 standard drink of alcohol    Types: 1 Cans of beer per week     Comment: occasional beer socially.    Drug use: No   Sexual activity: Yes  Other Topics Concern   Not on file  Social History Narrative   Not on file   Social Determinants of Health   Financial Resource Strain: Low Risk  (06/09/2021)   Overall Financial Resource Strain (CARDIA)    Difficulty of Paying Living Expenses: Not hard at all  Food Insecurity: No Food Insecurity (06/09/2021)   Hunger Vital Sign    Worried About Running Out of Food in the Last Year: Never true    Ran Out of Food in the Last Year: Never true  Transportation Needs: No Transportation Needs (06/09/2021)   PRAPARE - Hydrologist (Medical): No    Lack of Transportation (Non-Medical): No  Physical Activity: Sufficiently Active (06/09/2021)   Exercise Vital Sign    Days of Exercise per Week: 7 days    Minutes of Exercise per Session: 40 min  Stress: No Stress Concern Present (06/09/2021)   [place]    Feeling of Stress : Not at all  Social Connections: Moderately Integrated (06/09/2021)   Social Connection and Isolation Panel [NHANES]    Frequency of Communication with Friends and Family: More than three times a week    Frequency of Social Gatherings with Friends and Family: More than three times a week    Attends Religious Services: More than 4 times per year    Active Member of [employer] or Organizations: No    Attends Archivist Meetings: Never    Marital Status: Married    Allergies:  Allergies  Allergen Reactions   Januvia [Sitagliptin] Nausea And Vomiting   Poison Oak Extract [Poison Oak Extract] Dermatitis    Metabolic Disorder Labs: Lab Results  Component Value Date   HGBA1C 10.8 (H) 08/09/2022   MPG 114.02 06/10/2019   MPG 140 02/26/2019   No results found for: "PROLACTIN" Lab Results  Component Value Date   CHOL 255 (H) 08/09/2022   TRIG 155 (H) 08/09/2022   HDL 42 08/09/2022    CHOLHDL 6.1 (H) 08/09/2022   VLDL 64 (H) 12/30/2016   LDLCALC 184 (H) 08/09/2022   LDLCALC 146 (H) 04/07/2022   Lab Results  Component Value Date   TSH 1.830 11/04/2021   TSH 1.850 07/03/2020    Therapeutic Level Labs: No results found for: "LITHIUM" No results found for: "VALPROATE" No results found for: "CBMZ"  Current Medications: Current Outpatient Medications  Medication Sig Dispense Refill   ACCU-CHEK SOFTCLIX LANCETS lancets 100 each by Other route 4 (four) times daily. Use as instructed 100 each 5  ALPRAZolam (XANAX) 1 MG tablet Take 1 tablet (1 mg total) by mouth 2 (two) times daily. 60 tablet 3   Blood Glucose Monitoring Suppl (ACCU-CHEK AVIVA) device 1 each by Other route 2 (two) times daily. Use as instructed bid. E11.65 1 each 0   clobetasol cream (TEMOVATE) [id number] % Apply 1 application. topically 2 (two) times daily. 30 g 1   glucose blood (ONETOUCH VERIO) test strip Use as instructed to test blood glucose twice daily. ONE TOUCH VERIO FLEX test strips 100 each 12   insulin glargine (LANTUS) 100 unit/mL SOPN Inject 25 Units into the skin daily. 7.5 mL 5   lisinopril-hydrochlorothiazide (ZESTORETIC) 20-25 MG tablet Take 1 tablet by mouth daily. 90 tablet 3   metFORMIN (GLUCOPHAGE) 500 MG tablet Take 1 tablet (500 mg total) by mouth 2 (two) times daily with a meal. 60 tablet 5   rosuvastatin (CRESTOR) 40 MG tablet Take 1 tablet (40 mg total) by mouth daily. 90 tablet 1   No current facility-administered medications for this visit.     Musculoskeletal: Strength & Muscle Tone: na Gait & Station: na Patient leans: N/A  Psychiatric Specialty Exam: Review of Systems  Psychiatric/Behavioral:  The patient is nervous/anxious.   All other systems reviewed and are negative.   There were no vitals taken for this visit.There is no height or weight on file to calculate BMI.  General Appearance: NA  Eye Contact:  NA  Speech:  Clear and Coherent  Volume:  Normal  Mood:   Euthymic  Affect:  NA  Thought Process:  Goal Directed  Orientation:  Full (Time, Place, and Person)  Thought Content: WDL   Suicidal Thoughts:  No  Homicidal Thoughts:  No  Memory:  Immediate;   Good Recent;   Fair Remote;   NA  Judgement:  Poor  Insight:  Lacking  Psychomotor Activity:  Normal  Concentration:  Concentration: Fair and Attention Span: Fair  Recall:  [employer] of Knowledge: Fair  Language: Good  Akathisia:  No  Handed:  Right  AIMS (if indicated): not done  Assets:  Communication Skills Desire for Improvement Resilience Social Support  ADL's:  Intact  Cognition: Impaired,  Mild  Sleep:  Fair   Screenings: GAD-7    [place] Office Visit from 08/09/2022 in [facility]  Total GAD-7 Score 0      PHQ2-9    [place] Office Visit from 08/09/2022 in [facility] Office Visit from 04/07/2022 in [facility] Video Visit from 03/15/2022 in [place] at [place] Visit from 01/11/2022 in [facility] Office Visit from 11/04/2021 in [facility]  PHQ-2 Total Score 3 0 0 0 0  PHQ-9 Total Score 8 -- -- -- --      Flowsheet Row Video Visit from 03/15/2022 in [place] at [place] ED from 10/12/2021 in [facility] Department at [facility] ED from 08/19/2021 in [facility] [place] No Risk No Risk No Risk        Assessment and Plan: This patient is a 62 year old male with a history of depression anxiety and mild cognitive impairment.  He denies being depressed.  He would like to just continue on the Xanax 1 mg twice daily as needed.  He no longer uses the trazodone.  He will continue this dosage and return to  see me in 4 months  Collaboration of Care: Collaboration of Care: Primary Care Provider AEB notes are shared with  PCP through the epic system  Patient/Guardian was advised Release of Information must be obtained prior to any record release in order to collaborate their care with an outside provider. Patient/Guardian was advised if they have not already done so to contact the registration department to sign all necessary forms in order for [place] to release information regarding their care.   Consent: Patient/Guardian gives verbal consent for treatment and assignment of benefits for services provided during this visit. Patient/Guardian expressed understanding and agreed to proceed.    Levonne Spiller, MD 09/06/2022, 11:48 AM

## 2022-09-08 ENCOUNTER — Telehealth: Payer: Self-pay | Admitting: Family Medicine

## 2022-09-08 ENCOUNTER — Ambulatory Visit: Payer: Medicare Other | Admitting: Family Medicine

## 2022-09-08 NOTE — Telephone Encounter (Signed)
Pt has missed 3 appts (09/02/21, 02/22/22 & 09/08/22. He was on the way to appt today when we called him. He got the time confused. How do you want to proceed?

## 2022-09-09 ENCOUNTER — Encounter: Payer: Self-pay | Admitting: Family Medicine

## 2022-09-27 ENCOUNTER — Encounter: Payer: Self-pay | Admitting: Family Medicine

## 2022-09-27 ENCOUNTER — Ambulatory Visit (INDEPENDENT_AMBULATORY_CARE_PROVIDER_SITE_OTHER): Payer: Medicare Other | Admitting: Family Medicine

## 2022-09-27 ENCOUNTER — Encounter: Payer: Medicare Other | Admitting: Internal Medicine

## 2022-09-27 VITALS — BP 130/82 | HR 89 | Ht 68.0 in | Wt 176.1 lb

## 2022-09-27 DIAGNOSIS — E1165 Type 2 diabetes mellitus with hyperglycemia: Secondary | ICD-10-CM

## 2022-09-27 DIAGNOSIS — Z794 Long term (current) use of insulin: Secondary | ICD-10-CM | POA: Diagnosis not present

## 2022-09-27 DIAGNOSIS — E782 Mixed hyperlipidemia: Secondary | ICD-10-CM

## 2022-09-27 DIAGNOSIS — E1159 Type 2 diabetes mellitus with other circulatory complications: Secondary | ICD-10-CM

## 2022-09-27 LAB — POCT URINALYSIS DIP (CLINITEK)
Bilirubin, UA: NEGATIVE
Glucose, UA: 500 mg/dL — AB
Ketones, POC UA: NEGATIVE mg/dL
Leukocytes, UA: NEGATIVE
Nitrite, UA: NEGATIVE
POC PROTEIN,UA: NEGATIVE
Spec Grav, UA: 1.01 (ref 1.010–1.025)
Urobilinogen, UA: 0.2 E.U./dL
pH, UA: 5.5 (ref 5.0–8.0)

## 2022-09-27 LAB — GLUCOSE, POCT (MANUAL RESULT ENTRY): POC Glucose: 498 mg/dl — AB (ref 70–99)

## 2022-09-27 MED ORDER — INSULIN ASPART 100 UNIT/ML IJ SOLN
10.0000 [IU] | Freq: Once | INTRAMUSCULAR | Status: AC
  Administered 2022-09-27: 10 [IU] via SUBCUTANEOUS

## 2022-09-27 NOTE — Patient Instructions (Addendum)
F/U in 8 to 12 weeks, call if you neeed me sooner  10 units regular insulin in office today  BLOOd sugar is VERY high which is why you feel and are ill'  Start taking the lantus 25 units every morning at the same time, since you took it already today, take and additonal 5 units of lantus this evening only  Test and record blood sugars twie daily and writie it down for your visit with Dr Fransico Him Goal for fasting blood sugar ranges from 80 to 120 and 2 hours after any meal or at bedtime should be between 130 to 170. Nurse pls give diabetic log sheet to pt  Thanks for choosing [facility], we consider it a privelige to serve you.

## 2022-10-02 NOTE — Assessment & Plan Note (Signed)
Hyperlipidemia:Low fat diet discussed and encouraged.   Lipid Panel  Lab Results  Component Value Date   CHOL 255 (H) 08/09/2022   HDL 42 08/09/2022   LDLCALC 184 (H) 08/09/2022   TRIG 155 (H) 08/09/2022   CHOLHDL 6.1 (H) 08/09/2022     Needs to lower fat intake and take crestor as prescribed Updated lab needed at/ before next visit.

## 2022-10-02 NOTE — Progress Notes (Signed)
KENRY REEDE     MRN: [id number]      DOB: Jun 01, 1961   HPI Mr. Critelli is here for follow up and review oif recent labs C/o fatigue, polyuria and polydipsia Has not been taking medications as prescribed, and has not been testing blood sugar  ROS Denies recent fever or chills. Denies sinus pressure, nasal congestion, ear pain or sore throat. Denies chest congestion, productive cough or wheezing. Denies chest pains, palpitations and leg swelling Denies abdominal pain, nausea, vomiting,diarrhea or constipation.   Denies dysuria, frequency, hesitancy or incontinence. Denies joint pain, swelling and limitation in mobility. Denies headaches, seizures, numbness, or tingling. Denies depression, anxiety or insomnia. Denies skin break down or rash.   PE  BP 130/82   Pulse 89   Ht 5\' 8"  (1.727 m)   Wt 176 lb 1.9 oz (79.9 kg)   SpO2 96%   BMI 26.78 kg/m   Patient alert and in no cardiopulmonary distress.  HEENT: No facial asymmetry, EOMI,     Neck supple .  Chest: Clear to auscultation bilaterally.  CVS: S1, S2 no murmurs, no S3.Regular rate.  ABD: Soft non tender.   Ext: No edema  MS: Adequate ROM spine, shoulders, hips and knees.  Skin: Intact, no ulcerations or rash noted.  Psych: Good eye contact, normal affect. Memory intact not anxious or depressed appearing.  CNS: CN 2-12 intact, power,  normal throughout.no focal deficits noted.   Assessment & Plan  Type 2 diabetes mellitus with hyperglycemia Mr. Mccaster is reminded of the importance of commitment to daily physical activity for 30 minutes or more, as able and the need to limit carbohydrate intake to 30 to 60 grams per meal to help with blood sugar control.   The need to take medication as prescribed, test blood sugar as directed, and to call between visits if there is a concern that blood sugar is uncontrolled is also discussed.   Mr. Cluney is reminded of the importance of daily foot exam, annual eye  examination, and good blood sugar, blood pressure and cholesterol control.     Latest Ref Rng & Units 08/09/2022   11:28 AM 04/07/2022   11:34 AM 11/04/2021    9:44 AM 05/07/2021   11:08 AM 01/08/2021   10:17 AM  Diabetic Labs  HbA1c 4.8 - 5.6 % 10.8  5.9  5.9  6.0    Micro/Creat Ratio 0 - 29 mg/g creat   149     Chol 100 - 199 mg/dL 811  031  594  585  929   HDL >39 mg/dL 42  41  61  52  43   Calc LDL 0 - 99 mg/dL 244  628  638  177  116   Triglycerides 0 - 149 mg/dL 579  038  47  333  832   Creatinine 0.76 - 1.27 mg/dL 9.19  1.66  0.60  0.45  1.13       09/27/2022   10:32 AM 09/27/2022   10:31 AM 09/27/2022    9:47 AM 09/27/2022    9:45 AM 08/09/2022   11:07 AM 08/09/2022   10:38 AM 08/09/2022   10:36 AM  BP/Weight  Systolic BP 130 136 156 172 180 161 187  Diastolic BP 82 82 82 80 90 77 90  Wt. (Lbs)    176.12   181  BMI    26.78 kg/m2   27.52 kg/m2      Latest Ref Rng & Units 06/17/2021  12:00 AM 07/09/2019    3:59 PM  Foot/eye exam completion dates  Eye Exam No Retinopathy No Retinopathy     No Retinopathy         This result is from an external source.      Non compliant with treatment plan, imporrtance of compliance stressed and referred urgently to Endo CCUA, no ketonuria  Mixed hyperlipidemia Hyperlipidemia:Low fat diet discussed and encouraged.   Lipid Panel  Lab Results  Component Value Date   CHOL 255 (H) 08/09/2022   HDL 42 08/09/2022   LDLCALC 184 (H) 08/09/2022   TRIG 155 (H) 08/09/2022   CHOLHDL 6.1 (H) 08/09/2022     Needs to lower fat intake and take crestor as prescribed Updated lab needed at/ before next visit.

## 2022-10-02 NOTE — Assessment & Plan Note (Signed)
John [name] is reminded of the importance of commitment to daily physical activity for 30 minutes or more, as able and the need to limit carbohydrate intake to 30 to 60 grams per meal to help with blood sugar control.   The need to take medication as prescribed, test blood sugar as directed, and to call between visits if there is a concern that blood sugar is uncontrolled is also discussed.   John [name] is reminded of the importance of daily foot exam, annual eye examination, and good blood sugar, blood pressure and cholesterol control.     Latest Ref Rng & Units 08/09/2022   11:28 AM 04/07/2022   11:34 AM 11/04/2021    9:44 AM 05/07/2021   11:08 AM 01/08/2021   10:17 AM  Diabetic Labs  HbA1c 4.8 - 5.6 % 10.8  5.9  5.9  6.0    Micro/Creat Ratio 0 - 29 mg/g creat   149     Chol 100 - 199 mg/dL 388  719  597  471  855   HDL >39 mg/dL 42  41  61  52  43   Calc LDL 0 - 99 mg/dL 015  868  257  493  552   Triglycerides 0 - 149 mg/dL 174  715  47  953  967   Creatinine 0.76 - 1.27 mg/dL 2.89  7.91  5.04  1.36  1.13       09/27/2022   10:32 AM 09/27/2022   10:31 AM 09/27/2022    9:47 AM 09/27/2022    9:45 AM 08/09/2022   11:07 AM 08/09/2022   10:38 AM 08/09/2022   10:36 AM  BP/Weight  Systolic BP 130 136 156 172 180 161 187  Diastolic BP 82 82 82 80 90 77 90  Wt. (Lbs)    176.12   181  BMI    26.78 kg/m2   27.52 kg/m2      Latest Ref Rng & Units 06/17/2021   12:00 AM 07/09/2019    3:59 PM  Foot/eye exam completion dates  Eye Exam No Retinopathy No Retinopathy     No Retinopathy         This result is from an external source.      Non compliant with treatment plan, imporrtance of compliance stressed and referred urgently to Endo CCUA, no ketonuria

## 2022-10-04 ENCOUNTER — Ambulatory Visit (INDEPENDENT_AMBULATORY_CARE_PROVIDER_SITE_OTHER): Payer: Medicare Other | Admitting: Internal Medicine

## 2022-10-04 ENCOUNTER — Encounter: Payer: Self-pay | Admitting: Internal Medicine

## 2022-10-04 DIAGNOSIS — Z794 Long term (current) use of insulin: Secondary | ICD-10-CM | POA: Diagnosis not present

## 2022-10-04 DIAGNOSIS — Z0000 Encounter for general adult medical examination without abnormal findings: Secondary | ICD-10-CM

## 2022-10-04 DIAGNOSIS — E1165 Type 2 diabetes mellitus with hyperglycemia: Secondary | ICD-10-CM | POA: Diagnosis not present

## 2022-10-04 MED ORDER — ACCU-CHEK GUIDE ME W/DEVICE KIT: 1.0000 | PACK | Freq: Four times a day (QID) | 0 refills | Status: DC

## 2022-10-04 MED ORDER — ACCU-CHEK GUIDE VI STRP: ORAL_STRIP | 12 refills | Status: DC

## 2022-10-04 NOTE — Patient Instructions (Addendum)
  Mr. Arch , Thank you for taking time to come for your Medicare Wellness Visit. I appreciate your ongoing commitment to your health goals. Please review the following plan we discussed and let me know if I can assist you in the future.   These are the goals we discussed: Patient is going to stay active. Recommended 150 minutes of moderate exercise per week.   This is a list of the screening recommended for you and due dates:  Health Maintenance  Topic Date Due   DTaP/Tdap/Td vaccine (2 - Tdap) 09/16/2019   Zoster (Shingles) Vaccine (2 of 2) 08/16/2021   COVID-19 Vaccine (3 - 2023-24 season) 02/18/2022   Eye exam for diabetics  06/17/2022   Yearly kidney health urinalysis for diabetes  11/05/2022   Complete foot exam   01/12/2023   Medicare Annual Wellness Visit  01/12/2023   Flu Shot  01/19/2023   Hemoglobin A1C  02/07/2023   Yearly kidney function blood test for diabetes  08/10/2023   Colon Cancer Screening  09/22/2030   Hepatitis C Screening: USPSTF Recommendation to screen - Ages 18-79 yo.  Completed   HIV Screening  Completed   HPV Vaccine  Aged Out

## 2022-10-04 NOTE — Progress Notes (Signed)
Subjective:  This is a telephone encounter between John [name] and John [name] on 10/04/2022 for AWV. The visit was conducted with the patient located at home and John [name] at [facility]. The patient's identity was confirmed using their DOB and current address. The patient has consented to being evaluated through a telephone encounter and understands the associated risks (an examination cannot be done and the patient may need to come in for an appointment) / benefits (allows the patient to remain at home).    John [name] is a 62 y.o. male who presents for Medicare Annual/Subsequent preventive examination.  Review of Systems    Review of Systems  All other systems reviewed and are negative.   Objective:    There were no vitals filed for this visit. There is no height or weight on file to calculate BMI.     10/04/2022    9:39 AM 10/12/2021    9:19 AM 06/09/2021    8:20 AM 09/21/2020    7:52 AM 05/28/2020   10:16 AM 06/11/2019    5:53 PM 06/10/2019    8:24 AM  Advanced Directives  Does Patient Have a Medical Advance Directive? No No No No No No No  Would patient like information on creating a medical advance directive? Yes (MAU/Ambulatory/Procedural Areas - Information given)  Yes (MAU/Ambulatory/Procedural Areas - Information given) No - Patient declined No - Patient declined No - Patient declined No - Patient declined    Current Medications (verified) Outpatient Encounter Medications as of 10/04/2022  Medication Sig   ACCU-CHEK SOFTCLIX LANCETS lancets 100 each by Other route 4 (four) times daily. Use as instructed (Patient not taking: Reported on 09/27/2022)   ALPRAZolam (XANAX) 1 MG tablet Take 1 tablet (1 mg total) by mouth 2 (two) times daily. (Patient not taking: Reported on 09/27/2022)   insulin glargine (LANTUS) 100 unit/mL SOPN Inject 25 Units into the skin daily.   lisinopril-hydrochlorothiazide (ZESTORETIC) 20-25 MG tablet Take 1 tablet by mouth daily.   metFORMIN  (GLUCOPHAGE) 500 MG tablet Take 1 tablet (500 mg total) by mouth 2 (two) times daily with a meal. (Patient not taking: Reported on 09/27/2022)   rosuvastatin (CRESTOR) 40 MG tablet Take 1 tablet (40 mg total) by mouth daily.   [DISCONTINUED] Blood Glucose Monitoring Suppl (ACCU-CHEK AVIVA) device 1 each by Other route 2 (two) times daily. Use as instructed bid. E11.65 (Patient not taking: Reported on 09/27/2022)   [DISCONTINUED] glucose blood (ONETOUCH VERIO) test strip Use as instructed to test blood glucose twice daily. ONE TOUCH VERIO FLEX test strips (Patient not taking: Reported on 09/27/2022)   No facility-administered encounter medications on file as of 10/04/2022.    Allergies (verified) Januvia [sitagliptin] and Poison oak extract [poison oak extract]   History: Past Medical History:  Diagnosis Date   Anxiety    Depression 2007   Diabetes mellitus without complication    Helicobacter pylori ab+ 01/21/2019   Dx in 12/2018, will treat   Hyperlipidemia    Hypertension    Hypogonadism male    Obesity (BMI 30.0-34.9) 03/01/2017   Past Surgical History:  Procedure Laterality Date   CIRCUMCISION N/A 06/10/2019   Procedure: CIRCUMCISION ADULT;  Surgeon: Malen Gauze, MD;  Location: [facility];  Service: Urology;  Laterality: N/A;  30 MINS   COLONOSCOPY  01/20/2011   Dr. Darrick Penna: large internal hemorrhoids   COLONOSCOPY WITH PROPOFOL N/A 09/21/2020   Procedure: COLONOSCOPY WITH PROPOFOL;  Surgeon: Lanelle Bal, DO;  Location: [facility]  ENDO SUITE;  Service: Endoscopy;  Laterality: N/A;  AM-diabetic   HERNIA REPAIR     ventral hernia repair    VASECTOMY     Family History  Problem Relation Age of Onset   Heart failure Mother        living    Hypertension Mother    Hyperlipidemia Mother    Heart disease Mother    Hypertension Sister    Hypertension Brother    Colon cancer Neg Hx    Social History   Socioeconomic History   Marital status: Married    Spouse name: Not on file    Number of children: 1   Years of education: Not on file   Highest education level: Not on file  Occupational History   Occupation: Dispensing optician  Tobacco Use   Smoking status: Never   Smokeless tobacco: Never  Vaping Use   Vaping Use: Never used  Substance and Sexual Activity   Alcohol use: Yes    Alcohol/week: 1.0 standard drink of alcohol    Types: 1 Cans of beer per week    Comment: occasional beer socially.    Drug use: No   Sexual activity: Yes  Other Topics Concern   Not on file  Social History Narrative   Not on file   Social Determinants of Health   Financial Resource Strain: Low Risk  (06/09/2021)   Overall Financial Resource Strain (CARDIA)    Difficulty of Paying Living Expenses: Not hard at all  Food Insecurity: No Food Insecurity (06/09/2021)   Hunger Vital Sign    Worried About Running Out of Food in the Last Year: Never true    Ran Out of Food in the Last Year: Never true  Transportation Needs: No Transportation Needs (06/09/2021)   PRAPARE - Administrator, Civil Service (Medical): No    Lack of Transportation (Non-Medical): No  Physical Activity: Sufficiently Active (06/09/2021)   Exercise Vital Sign    Days of Exercise per Week: 7 days    Minutes of Exercise per Session: 40 min  Stress: No Stress Concern Present (06/09/2021)   [employer] of Occupational Health - Occupational Stress Questionnaire    Feeling of Stress : Not at all  Social Connections: Moderately Integrated (06/09/2021)   Social Connection and Isolation Panel [NHANES]    Frequency of Communication with Friends and Family: More than three times a week    Frequency of Social Gatherings with Friends and Family: More than three times a week    Attends Religious Services: More than 4 times per year    Active Member of [employer] or Organizations: No    Attends Engineer, structural: Never    Marital Status: Married    Tobacco Counseling Counseling given: Not  Answered   Clinical Intake:  Pre-visit preparation completed: Yes  Pain : No/denies pain     Diabetes: Yes CBG done?: No Did pt. bring in CBG monitor from home?: No  How often do you need to have someone help you when you read instructions, pamphlets, or other written materials from your doctor or pharmacy?: 4 - Often What is the last grade level you completed in school?: 10th grade     Activities of Daily Living     No data to display          Patient Care Team: Kerri Perches, MD as PCP - General (Family Medicine) West Bali, MD (Inactive) (Gastroenterology) Lanelle Bal, DO as Consulting  Physician (Internal Medicine)  Indicate any recent Medical Services you may have received from other than [facility] providers in the past year (date may be approximate).     Assessment:   This is a routine wellness examination for John [name].  Hearing/Vision screen No results found.  Dietary issues and exercise activities discussed:     Goals Addressed   None    Depression Screen    10/04/2022    9:41 AM 09/27/2022    9:46 AM 08/09/2022   10:37 AM 04/07/2022   10:34 AM 03/15/2022    1:56 PM 01/11/2022    8:25 AM 11/04/2021    8:58 AM  PHQ 2/9 Scores  PHQ - 2 Score 6 1 3  0  0 0  PHQ- 9 Score  3 8         Information is confidential and restricted. Go to Review Flowsheets to unlock data.    Fall Risk    10/04/2022    9:41 AM 09/27/2022    9:46 AM 08/09/2022   10:37 AM 04/07/2022   10:34 AM 01/11/2022    8:25 AM  Fall Risk   Falls in the past year? 0 0 0 0 0  Number falls in past yr:  0 0 0 0  Injury with Fall?  0 0 0 0  Risk for fall due to :  No Fall Risks No Fall Risks No Fall Risks No Fall Risks  Follow up  Falls evaluation completed Falls evaluation completed Falls evaluation completed Falls evaluation completed    FALL RISK PREVENTION PERTAINING TO THE HOME:  Any stairs in or around the home? Yes  If so, are there any without handrails? No  Home free  of loose throw rugs in walkways, pet beds, electrical cords, etc? Yes  Adequate lighting in your home to reduce risk of falls? Yes   ASSISTIVE DEVICES UTILIZED TO PREVENT FALLS:  Life alert? No  Use of a cane, walker or w/c? No  Grab bars in the bathroom? No  Shower chair or bench in shower? No  Elevated toilet seat or a handicapped toilet? No     Cognitive Function:    06/09/2021    8:22 AM  MMSE - Mini Mental State Exam  Not completed: Unable to complete        10/04/2022    9:41 AM 06/09/2021    8:22 AM 05/28/2020   10:20 AM 05/28/2020   10:17 AM 05/28/2019   10:35 AM  6CIT Screen  What Year? 0 points 0 points 0 points 0 points 0 points  What month? 0 points 0 points 0 points 0 points 0 points  What time? 0 points 0 points 0 points  0 points  Count back from 20 0 points 0 points 0 points  0 points  Months in reverse 4 points 4 points   4 points  Repeat phrase 2 points 10 points   10 points  Total Score 6 points 14 points   14 points    Immunizations Immunization History  Administered Date(s) Administered   Influenza Whole 03/18/2010, 04/13/2011   Influenza,inj,Quad PF,6+ Mos 05/20/2013, 05/08/2014, 06/11/2015, 04/18/2017, 05/23/2018, 03/18/2019, 02/12/2020, 05/07/2021, 04/07/2022   Janssen (J&J) SARS-COV-2 Vaccination 11/02/2019   PNEUMOCOCCAL CONJUGATE-20 05/07/2021   Pfizer Covid-19 Vaccine Bivalent Booster 50yrs & up 06/21/2021   Pneumococcal Polysaccharide-23 11/26/2013, 03/18/2019   Td 09/15/2009   Zoster Recombinat (Shingrix) 06/21/2021    TDAP status: Up to date  Flu Vaccine status: Up to date  Pneumococcal vaccine  status: Up to date  Covid-19 vaccine status: Information provided on how to obtain vaccines.   Qualifies for Shingles Vaccine? Yes   Zostavax completed No   Shingrix Completed?: No.    Education has been provided regarding the importance of this vaccine. Patient has been advised to call insurance company to determine out of pocket expense  if they have not yet received this vaccine. Advised may also receive vaccine at local pharmacy or Health Dept. Verbalized acceptance and understanding.  Screening Tests Health Maintenance  Topic Date Due   DTaP/Tdap/Td (2 - Tdap) 09/16/2019   Zoster Vaccines- Shingrix (2 of 2) 08/16/2021   COVID-19 Vaccine (3 - 2023-24 season) 02/18/2022   OPHTHALMOLOGY EXAM  06/17/2022   Diabetic kidney evaluation - Urine ACR  11/05/2022   FOOT EXAM  01/12/2023   Medicare Annual Wellness (AWV)  01/12/2023   INFLUENZA VACCINE  01/19/2023   HEMOGLOBIN A1C  02/07/2023   Diabetic kidney evaluation - eGFR measurement  08/10/2023   COLONOSCOPY (Pts 45-63yrs Insurance coverage will need to be confirmed)  09/22/2030   Hepatitis C Screening  Completed   HIV Screening  Completed   HPV VACCINES  Aged Out    Health Maintenance  Health Maintenance Due  Topic Date Due   DTaP/Tdap/Td (2 - Tdap) 09/16/2019   Zoster Vaccines- Shingrix (2 of 2) 08/16/2021   COVID-19 Vaccine (3 - 2023-24 season) 02/18/2022   OPHTHALMOLOGY EXAM  06/17/2022    Colorectal cancer screening: Type of screening: Colonoscopy. Completed 09/19/2020. Repeat every 10 years  Lung Cancer Screening: (Low Dose CT Chest recommended if Age 31-80 years, 30 pack-year currently smoking OR have quit w/in 15years.) does not qualify.     Additional Screening:  Hepatitis C Screening: does not qualify; Completed 10/22/2013  Vision Screening: Recommended annual ophthalmology exams for early detection of glaucoma and other disorders of the eye. Is the patient up to date with their annual eye exam?  No  Who is the provider or what is the name of the office in which the patient attends annual eye exams? [facility] If pt is not established with a provider, would they like to be referred to a provider to establish care? No .   Dental Screening: Recommended annual dental exams for proper oral hygiene  Community Resource Referral / Chronic Care  Management: CRR required this visit?  No   CCM required this visit?  No      Plan:     I have personally reviewed and noted the following in the patient's chart:   Medical and social history Use of alcohol, tobacco or illicit drugs  Current medications and supplements including opioid prescriptions. Patient is not currently taking opioid prescriptions. Functional ability and status Nutritional status Physical activity Advanced directives List of other physicians Hospitalizations, surgeries, and ER visits in previous 12 months Vitals Screenings to include cognitive, depression, and falls Referrals and appointments  In addition, I have reviewed and discussed with patient certain preventive protocols, quality metrics, and best practice recommendations. A written personalized care plan for preventive services as well as general preventive health recommendations were provided to patient.     John Banister, MD   10/04/2022

## 2022-10-13 ENCOUNTER — Telehealth: Payer: Self-pay | Admitting: Family Medicine

## 2022-10-13 ENCOUNTER — Other Ambulatory Visit: Payer: Self-pay

## 2022-10-13 DIAGNOSIS — E1165 Type 2 diabetes mellitus with hyperglycemia: Secondary | ICD-10-CM

## 2022-10-13 MED ORDER — ACCU-CHEK GUIDE ME W/DEVICE KIT: 1.0000 | PACK | Freq: Four times a day (QID) | 0 refills | Status: AC

## 2022-10-13 MED ORDER — ACCU-CHEK GUIDE VI STRP
ORAL_STRIP | 12 refills | Status: AC
Start: 2022-10-13 — End: ?

## 2022-10-13 MED ORDER — ACCU-CHEK SOFTCLIX LANCETS MISC: 100.0000 | Freq: Four times a day (QID) | 5 refills | Status: DC

## 2022-10-13 MED ORDER — INSULIN GLARGINE 100 UNITS/ML SOLOSTAR PEN: 25.0000 [IU] | PEN_INJECTOR | Freq: Every day | SUBCUTANEOUS | 5 refills | Status: DC

## 2022-10-13 NOTE — Telephone Encounter (Signed)
Once insultin pen broke and can not use it.  Not sure if he pushed it too hard but will not work right. Can not check his sugar . Send refill to [facility]

## 2022-10-13 NOTE — Telephone Encounter (Signed)
Refill sent.

## 2022-10-14 ENCOUNTER — Ambulatory Visit (INDEPENDENT_AMBULATORY_CARE_PROVIDER_SITE_OTHER): Payer: Medicare Other | Admitting: Nurse Practitioner

## 2022-10-14 ENCOUNTER — Encounter: Payer: Self-pay | Admitting: Nurse Practitioner

## 2022-10-14 VITALS — BP 153/74 | HR 75 | Ht 68.0 in | Wt 180.0 lb

## 2022-10-14 DIAGNOSIS — Z794 Long term (current) use of insulin: Secondary | ICD-10-CM

## 2022-10-14 DIAGNOSIS — E1165 Type 2 diabetes mellitus with hyperglycemia: Secondary | ICD-10-CM

## 2022-10-14 MED ORDER — INSULIN GLARGINE 100 UNITS/ML SOLOSTAR PEN: 20.0000 [IU] | PEN_INJECTOR | Freq: Every day | SUBCUTANEOUS | 3 refills | Status: DC

## 2022-10-14 MED ORDER — ACCU-CHEK FASTCLIX LANCETS MISC: 6 refills | Status: AC

## 2022-10-14 NOTE — Progress Notes (Signed)
10/14/2022    Endocrinology Follow Up Visit    Subjective:    Patient ID: John [name], male    DOB: 1961-01-13. Patient is being engaged in follow-up for management of chronically uncontrolled type 2 diabetes, hyperlipidemia, hypertension. PMD:   Kerri Perches, MD  Past Medical History:  Diagnosis Date   Anxiety    Depression 2007   Diabetes mellitus without complication (HCC)    Helicobacter pylori ab+ 01/21/2019   Dx in 12/2018, will treat   Hyperlipidemia    Hypertension    Hypogonadism male    Obesity (BMI 30.0-34.9) 03/01/2017   Past Surgical History:  Procedure Laterality Date   CIRCUMCISION N/A 06/10/2019   Procedure: CIRCUMCISION ADULT;  Surgeon: Malen Gauze, MD;  Location: [facility];  Service: Urology;  Laterality: N/A;  30 MINS   COLONOSCOPY  01/20/2011   Dr. Darrick Penna: large internal hemorrhoids   COLONOSCOPY WITH PROPOFOL N/A 09/21/2020   Procedure: COLONOSCOPY WITH PROPOFOL;  Surgeon: Lanelle Bal, DO;  Location: [facility];  Service: Endoscopy;  Laterality: N/A;  AM-diabetic   HERNIA REPAIR     ventral hernia repair    VASECTOMY     Social History   Socioeconomic History   Marital status: Married    Spouse name: Not on file   Number of children: 1   Years of education: Not on file   Highest education level: Not on file  Occupational History   Occupation: Dispensing optician  Tobacco Use   Smoking status: Never   Smokeless tobacco: Never  Vaping Use   Vaping Use: Never used  Substance and Sexual Activity   Alcohol use: Yes    Alcohol/week: 1.0 standard drink of alcohol    Types: 1 Cans of beer per week    Comment: occasional beer socially.    Drug use: No   Sexual activity: Yes  Other Topics Concern   Not on file  Social History Narrative   Not on file   Social Determinants of Health   Financial Resource Strain: Low Risk  (06/09/2021)   Overall Financial Resource Strain (CARDIA)    Difficulty of Paying Living Expenses: Not hard at  all  Food Insecurity: No Food Insecurity (06/09/2021)   Hunger Vital Sign    Worried About Running Out of Food in the Last Year: Never true    Ran Out of Food in the Last Year: Never true  Transportation Needs: No Transportation Needs (06/09/2021)   PRAPARE - Administrator, Civil Service (Medical): No    Lack of Transportation (Non-Medical): No  Physical Activity: Sufficiently Active (06/09/2021)   Exercise Vital Sign    Days of Exercise per Week: 7 days    Minutes of Exercise per Session: 40 min  Stress: No Stress Concern Present (06/09/2021)   [employer] of Occupational Health - Occupational Stress Questionnaire    Feeling of Stress : Not at all  Social Connections: Moderately Integrated (06/09/2021)   Social Connection and Isolation Panel [NHANES]    Frequency of Communication with Friends and Family: More than three times a week    Frequency of Social Gatherings with Friends and Family: More than three times a week    Attends Religious Services: More than 4 times per year    Active Member of [employer] or Organizations: No    Attends Banker Meetings: Never    Marital Status: Married   Outpatient Encounter Medications as of 10/14/2022  Medication Sig  Accu-Chek FastClix Lancets MISC Use to monitor glucose twice daily   ALPRAZolam (XANAX) 1 MG tablet Take 1 tablet (1 mg total) by mouth 2 (two) times daily.   Blood Glucose Monitoring Suppl (ACCU-CHEK GUIDE ME) w/Device KIT 1 Device by Does not apply route 4 (four) times daily.   glucose blood (ACCU-CHEK GUIDE) test strip Use as instructed   insulin glargine (LANTUS) 100 unit/mL SOPN Inject 20 Units into the skin at bedtime.   lisinopril-hydrochlorothiazide (ZESTORETIC) 20-25 MG tablet Take 1 tablet by mouth daily.   metFORMIN (GLUCOPHAGE) 500 MG tablet Take 1 tablet (500 mg total) by mouth 2 (two) times daily with a meal.   rosuvastatin (CRESTOR) 40 MG tablet Take 1 tablet (40 mg total) by mouth daily.    [DISCONTINUED] ACCU-CHEK SOFTCLIX LANCETS lancets 100 each by Other route 4 (four) times daily. Use as instructed (Patient not taking: Reported on 09/27/2022)   [DISCONTINUED] Accu-Chek Softclix Lancets lancets 100 each by Other route 4 (four) times daily. Use as instructed   [DISCONTINUED] Blood Glucose Monitoring Suppl (ACCU-CHEK GUIDE ME) w/Device KIT 1 Device by Does not apply route 4 (four) times daily.   [DISCONTINUED] glucose blood (ACCU-CHEK GUIDE) test strip Use as instructed   [DISCONTINUED] insulin glargine (LANTUS) 100 unit/mL SOPN Inject 25 Units into the skin daily.   [DISCONTINUED] insulin glargine (LANTUS) 100 unit/mL SOPN Inject 25 Units into the skin daily. (Patient taking differently: Inject 25 Units into the skin 2 (two) times daily.)   No facility-administered encounter medications on file as of 10/14/2022.   ALLERGIES: Allergies  Allergen Reactions   Januvia [Sitagliptin] Nausea And Vomiting   Poison Oak Extract [Poison Oak Extract] Dermatitis   VACCINATION STATUS: Immunization History  Administered Date(s) Administered   Influenza Whole 03/18/2010, 04/13/2011   Influenza,inj,Quad PF,6+ Mos 05/20/2013, 05/08/2014, 06/11/2015, 04/18/2017, 05/23/2018, 03/18/2019, 02/12/2020, 05/07/2021, 04/07/2022   Janssen (J&J) SARS-COV-2 Vaccination 11/02/2019   PNEUMOCOCCAL CONJUGATE-20 05/07/2021   Pfizer Covid-19 Vaccine Bivalent Booster 63yrs & up 06/21/2021   Pneumococcal Polysaccharide-23 11/26/2013, 03/18/2019   Td 09/15/2009   Zoster Recombinat (Shingrix) 06/21/2021    Diabetes He presents for his follow-up diabetic visit. He has type 2 diabetes mellitus. Onset time: He was diagnosed at age 62. His disease course has been fluctuating. Hypoglycemia symptoms include nervousness/anxiousness, sweats and tremors. Pertinent negatives for hypoglycemia include no headaches or seizures. Associated symptoms include blurred vision, fatigue, polydipsia, polyuria and visual change.  Pertinent negatives for diabetes include no chest pain. Hypoglycemia complications include nocturnal hypoglycemia. Symptoms are stable. Diabetic complications include nephropathy. Risk factors for coronary artery disease include dyslipidemia, diabetes mellitus, hypertension, male sex, obesity and sedentary lifestyle. Current diabetic treatment includes insulin injections and oral agent (monotherapy). He is compliant with treatment most of the time. His weight is fluctuating minimally. He is following a generally healthy (admits to drinking sweet tea at times) diet. When asked about meal planning, he reported none. He has not had a previous visit with a dietitian. He participates in exercise three times a week. His home blood glucose trend is decreasing steadily. His breakfast blood glucose range is generally 70-90 mg/dl. His overall blood glucose range is 110-130 mg/dl. (He presents today after long absence with his meter and logs showing tight fasting glycemic profile.  He had stopped taking all of his medications for some time. His most recent A1c on 2/20 was 10.8%.  He notes his vision is significantly worse recently, cannot see to check his glucose- has to have help.  Analysis of  his meter shows 7-day average of 114, 14-day average of 115.  His PCP started him back on insulin and Metformin.) An ACE inhibitor/angiotensin II receptor blocker is being taken. He does not see a podiatrist.Eye exam is current.  Hyperlipidemia This is a chronic problem. The current episode started more than 1 year ago. The problem is uncontrolled. Recent lipid tests were reviewed and are high. Exacerbating diseases include chronic renal disease, diabetes and obesity. Factors aggravating his hyperlipidemia include fatty foods. Pertinent negatives include no chest pain, myalgias or shortness of breath. Current antihyperlipidemic treatment includes statins. The current treatment provides mild improvement of lipids. Compliance problems  include adherence to diet and adherence to exercise.  Risk factors for coronary artery disease include diabetes mellitus, hypertension, male sex, obesity, a sedentary lifestyle and dyslipidemia.  Hypertension This is a chronic problem. The current episode started more than 1 year ago. The problem has been gradually improving since onset. The problem is controlled. Associated symptoms include blurred vision and sweats. Pertinent negatives include no chest pain, headaches, palpitations or shortness of breath. There are no associated agents to hypertension. Risk factors for coronary artery disease include dyslipidemia, diabetes mellitus, male gender, obesity and sedentary lifestyle. Past treatments include ACE inhibitors, diuretics and calcium channel blockers. The current treatment provides moderate improvement. Compliance problems include diet and exercise.  Hypertensive end-organ damage includes kidney disease. Identifiable causes of hypertension include chronic renal disease.    Review of systems  Constitutional: + Minimally fluctuating body weight,  current Body mass index is 27.37 kg/m. , no fatigue, no subjective hyperthermia, no subjective hypothermia Eyes: + blurry vision, no xerophthalmia ENT: no sore throat, no nodules palpated in throat, no dysphagia/odynophagia, no hoarseness Cardiovascular: no chest pain, no shortness of breath, no palpitations, no leg swelling Respiratory: no cough, no shortness of breath Gastrointestinal: no nausea/vomiting/diarrhea Musculoskeletal: no muscle/joint aches Skin: no rashes, no hyperemia Neurological: no tremors, no numbness, no tingling, no dizziness Psychiatric: no depression, no anxiety   Objective:    BP (!) 153/74   Pulse 75   Ht 5\' 8"  (1.727 m)   Wt 180 lb (81.6 kg)   BMI 27.37 kg/m   Wt Readings from Last 3 Encounters:  10/14/22 180 lb (81.6 kg)  09/27/22 176 lb 1.9 oz (79.9 kg)  08/09/22 181 lb (82.1 kg)    BP Readings from Last 3  Encounters:  10/14/22 (!) 153/74  09/27/22 130/82  08/09/22 (!) 180/90     Physical Exam- Limited  Constitutional:  Body mass index is 27.37 kg/m. , not in acute distress, normal state of mind Eyes:  EOMI, no exophthalmos Musculoskeletal: no gross deformities, strength intact in all four extremities, no gross restriction of joint movements Skin:  no rashes, no hyperemia Neurological: no tremor with outstretched hands   CMP     Component Value Date/Time   NA 131 (L) 08/09/2022 1128   K 4.2 08/09/2022 1128   CL 91 (L) 08/09/2022 1128   CO2 22 08/09/2022 1128   GLUCOSE 398 (H) 08/09/2022 1128   GLUCOSE 292 (H) 09/18/2020 1300   BUN 16 08/09/2022 1128   CREATININE 1.49 (H) 08/09/2022 1128   CREATININE 1.38 (H) 01/07/2020 0729   CALCIUM 10.3 (H) 08/09/2022 1128   PROT 7.5 08/09/2022 1128   ALBUMIN 4.3 08/09/2022 1128   AST 13 08/09/2022 1128   ALT 13 08/09/2022 1128   ALKPHOS 127 (H) 08/09/2022 1128   BILITOT 0.5 08/09/2022 1128   GFRNONAA >60 09/18/2020 1300  GFRNONAA 56 (L) 01/07/2020 0729   GFRAA 73 07/03/2020 1038   GFRAA 64 01/07/2020 0729    Lab Results  Component Value Date   HGBA1C 10.8 (H) 08/09/2022   HGBA1C 5.9 (H) 04/07/2022   HGBA1C 5.9 (H) 11/04/2021   MICROALBUR 13.8 03/18/2019   MICROALBUR 5.3 02/06/2018   MICROALBUR 134.8 (H) 12/30/2016    Lipid Panel     Component Value Date/Time   CHOL 255 (H) 08/09/2022 1128   TRIG 155 (H) 08/09/2022 1128   HDL 42 08/09/2022 1128   CHOLHDL 6.1 (H) 08/09/2022 1128   CHOLHDL 4.2 01/07/2020 0729   VLDL 64 (H) 12/30/2016 0950   LDLCALC 184 (H) 08/09/2022 1128   LDLCALC 99 01/07/2020 0729     Assessment & Plan:   1) Diabetes mellitus type 2 in obese [facility])  - Patient has currently uncontrolled symptomatic type 2 DM since  62 years of age.  He presents today after long absence with his meter and logs showing tight fasting glycemic profile.  He had stopped taking all of his medications for some time. His  most recent A1c on 2/20 was 10.8%.  He notes his vision is significantly worse recently, cannot see to check his glucose- has to have help.  Analysis of his meter shows 7-day average of 114, 14-day average of 115.  His PCP started him back on insulin and Metformin.   His diabetes is complicated by obesity , prior history of noncompliance/nonadherence, and patient remains at a high risk for more acute and chronic complications of diabetes which include CAD, CVA, CKD, retinopathy, and neuropathy. These are all discussed in detail with the patient.  - Nutritional counseling repeated at each appointment due to patients tendency to fall back in to old habits.  - The patient admits there is a room for improvement in their diet and drink choices. -  Suggestion is made for the patient to avoid simple carbohydrates from their diet including Cakes, Sweet Desserts / Pastries, Ice Cream, Soda (diet and regular), Sweet Tea, Candies, Chips, Cookies, Sweet Pastries, Store Bought Juices, Alcohol in Excess of 1-2 drinks a day, Artificial Sweeteners, Coffee Creamer, and "Sugar-free" Products. This will help patient to have stable blood glucose profile and potentially avoid unintended weight gain.   - I encouraged the patient to switch to unprocessed or minimally processed complex starch and increased protein intake (animal or plant source), fruits, and vegetables.   - Patient is advised to stick to a routine mealtimes to eat 3 meals a day and avoid unnecessary snacks (to snack only to correct hypoglycemia).  - I have approached patient with the following individualized plan to manage diabetes and patient agrees:   -Given his tight fasting glycemic profile, he is advised to lower his Lantus to 20 units SQ nightly and continue Metforimin 500 mg po twice daily with meals.     -He is strongly encouraged to monitor glucose at least twice daily, before breakfast and before bed, and call the clinic if he has readings less  than 70 or greater than 200 for 3 tests in a row.  He is aware that his insulin may need further adjustment to avoid hypoglycemia.  He is great candidate for CGM due to hypoglycemia and visual impairment.  Will send order for dexcom G7 to [employer].  - Patient will be considered for incretin therapy as appropriate next visit.  2) BP/HTN: His blood pressure is controlled to target.  He is advised to continue Lisinopril-HCT 20-25 mg  po daily.   3) Lipids/HPL: His most recent lipid panel from 08/09/22 shows uncontrolled LDL at 184.  He is advised to continue Crestor 40 mg po daily at bedtime.  Side effects and precautions discussed with him.  4) Chronic Care/Health Maintenance: -Patient is on ACEI/ARB and Statin medications and encouraged to continue to follow up with Ophthalmology, Podiatrist at least yearly or according to recommendations, and advised to stay away from smoking. I have recommended yearly flu vaccine and pneumonia vaccination at least every 5 years; moderate intensity exercise for up to 150 minutes weekly; and  sleep for at least 7 hours a day.  - I advised patient to maintain close follow up with Kerri Perches, MD for primary care needs.      I spent  46  minutes in the care of the patient today including review of labs from CMP, Lipids, Thyroid Function, Hematology (current and previous including abstractions from other facilities); face-to-face time discussing  his blood glucose readings/logs, discussing hypoglycemia and hyperglycemia episodes and symptoms, medications doses, his options of short and long term treatment based on the latest standards of care / guidelines;  discussion about incorporating lifestyle medicine;  and documenting the encounter. Risk reduction counseling performed per USPSTF guidelines to reduce obesity and cardiovascular risk factors.     Please refer to Patient Instructions for Blood Glucose Monitoring and Insulin/Medications Dosing Guide"  in  media tab for additional information. Please  also refer to " Patient Self Inventory" in the Media  tab for reviewed elements of pertinent patient history.  John [name] participated in the discussions, expressed understanding, and voiced agreement with the above plans.  All questions were answered to his satisfaction. he is encouraged to contact clinic should he have any questions or concerns prior to his return visit.    Follow up plan: - Return in about 3 months (around 01/13/2023) for Diabetes F/U with A1c in office, No previsit labs, Bring meter and logs.    Ronny Bacon, [facility] [address] [place], [place] [address] Phone: [phone] Fax: [phone]  10/14/2022, 10:19 AM

## 2022-10-17 DIAGNOSIS — E08649 Diabetes mellitus due to underlying condition with hypoglycemia without coma: Secondary | ICD-10-CM | POA: Diagnosis not present

## 2022-10-18 DIAGNOSIS — E08649 Diabetes mellitus due to underlying condition with hypoglycemia without coma: Secondary | ICD-10-CM | POA: Diagnosis not present

## 2022-10-25 ENCOUNTER — Other Ambulatory Visit: Payer: Self-pay

## 2022-10-25 ENCOUNTER — Emergency Department (HOSPITAL_COMMUNITY): Payer: Medicare Other

## 2022-10-25 ENCOUNTER — Emergency Department (HOSPITAL_COMMUNITY)
Admission: EM | Admit: 2022-10-25 | Discharge: 2022-10-25 | Disposition: A | Discharge status: Home / Self Care | Payer: Medicare Other | Attending: Emergency Medicine | Admitting: Emergency Medicine

## 2022-10-25 ENCOUNTER — Ambulatory Visit (INDEPENDENT_AMBULATORY_CARE_PROVIDER_SITE_OTHER): Payer: Medicare Other | Admitting: Family Medicine

## 2022-10-25 ENCOUNTER — Encounter (HOSPITAL_COMMUNITY): Payer: Self-pay | Admitting: Emergency Medicine

## 2022-10-25 ENCOUNTER — Encounter: Payer: Self-pay | Admitting: Family Medicine

## 2022-10-25 VITALS — BP 148/70 | HR 81 | Temp 98.4°F | Ht 68.0 in | Wt 178.0 lb

## 2022-10-25 DIAGNOSIS — R0789 Other chest pain: Secondary | ICD-10-CM

## 2022-10-25 DIAGNOSIS — H538 Other visual disturbances: Secondary | ICD-10-CM

## 2022-10-25 DIAGNOSIS — Z79899 Other long term (current) drug therapy: Secondary | ICD-10-CM | POA: Insufficient documentation

## 2022-10-25 DIAGNOSIS — R052 Subacute cough: Secondary | ICD-10-CM | POA: Diagnosis not present

## 2022-10-25 DIAGNOSIS — I10 Essential (primary) hypertension: Secondary | ICD-10-CM | POA: Insufficient documentation

## 2022-10-25 DIAGNOSIS — E119 Type 2 diabetes mellitus without complications: Secondary | ICD-10-CM | POA: Insufficient documentation

## 2022-10-25 DIAGNOSIS — Z20822 Contact with and (suspected) exposure to covid-19: Secondary | ICD-10-CM | POA: Insufficient documentation

## 2022-10-25 DIAGNOSIS — E1165 Type 2 diabetes mellitus with hyperglycemia: Secondary | ICD-10-CM

## 2022-10-25 DIAGNOSIS — Z9189 Other specified personal risk factors, not elsewhere classified: Secondary | ICD-10-CM

## 2022-10-25 DIAGNOSIS — R5383 Other fatigue: Secondary | ICD-10-CM

## 2022-10-25 DIAGNOSIS — Z794 Long term (current) use of insulin: Secondary | ICD-10-CM

## 2022-10-25 DIAGNOSIS — Z7984 Long term (current) use of oral hypoglycemic drugs: Secondary | ICD-10-CM | POA: Insufficient documentation

## 2022-10-25 DIAGNOSIS — D539 Nutritional anemia, unspecified: Secondary | ICD-10-CM | POA: Diagnosis not present

## 2022-10-25 LAB — URINALYSIS, ROUTINE W REFLEX MICROSCOPIC
Bacteria, UA: NONE SEEN
Bilirubin Urine: NEGATIVE
Glucose, UA: NEGATIVE mg/dL
Hgb urine dipstick: NEGATIVE
Ketones, ur: NEGATIVE mg/dL
Leukocytes,Ua: NEGATIVE
Nitrite: NEGATIVE
Protein, ur: 30 mg/dL — AB
Specific Gravity, Urine: 1.01 (ref 1.005–1.030)
pH: 6 (ref 5.0–8.0)

## 2022-10-25 LAB — DIFFERENTIAL
Abs Immature Granulocytes: 0.02 10*3/uL (ref 0.00–0.07)
Basophils Absolute: 0.1 10*3/uL (ref 0.0–0.1)
Basophils Relative: 1 %
Eosinophils Absolute: 0.4 10*3/uL (ref 0.0–0.5)
Eosinophils Relative: 4 %
Immature Granulocytes: 0 %
Lymphocytes Relative: 35 %
Lymphs Abs: 3.5 10*3/uL (ref 0.7–4.0)
Monocytes Absolute: 0.9 10*3/uL (ref 0.1–1.0)
Monocytes Relative: 9 %
Neutro Abs: 5.1 10*3/uL (ref 1.7–7.7)
Neutrophils Relative %: 51 %

## 2022-10-25 LAB — CBC
HCT: 34.7 % — ABNORMAL LOW (ref 39.0–52.0)
Hemoglobin: 11.4 g/dL — ABNORMAL LOW (ref 13.0–17.0)
MCH: 32 pg (ref 26.0–34.0)
MCHC: 32.9 g/dL (ref 30.0–36.0)
MCV: 97.5 fL (ref 80.0–100.0)
Platelets: 290 10*3/uL (ref 150–400)
RBC: 3.56 MIL/uL — ABNORMAL LOW (ref 4.22–5.81)
RDW: 12.7 % (ref 11.5–15.5)
WBC: 9.8 10*3/uL (ref 4.0–10.5)
nRBC: 0 % (ref 0.0–0.2)

## 2022-10-25 LAB — COMPREHENSIVE METABOLIC PANEL
ALT: 17 U/L (ref 0–44)
AST: 19 U/L (ref 15–41)
Albumin: 4.1 g/dL (ref 3.5–5.0)
Alkaline Phosphatase: 73 U/L (ref 38–126)
Anion gap: 8 (ref 5–15)
BUN: 16 mg/dL (ref 8–23)
CO2: 27 mmol/L (ref 22–32)
Calcium: 9.2 mg/dL (ref 8.9–10.3)
Chloride: 99 mmol/L (ref 98–111)
Creatinine, Ser: 1.28 mg/dL — ABNORMAL HIGH (ref 0.61–1.24)
GFR, Estimated: >60 mL/min (ref >60)
Glucose, Bld: 83 mg/dL (ref 70–99)
Potassium: 4.2 mmol/L (ref 3.5–5.1)
Sodium: 134 mmol/L — ABNORMAL LOW (ref 135–145)
Total Bilirubin: 0.5 mg/dL (ref 0.3–1.2)
Total Protein: 7.8 g/dL (ref 6.5–8.1)

## 2022-10-25 LAB — RESP PANEL BY RT-PCR (RSV, FLU A&B, COVID)  RVPGX2
Influenza A by PCR: NEGATIVE
Influenza B by PCR: NEGATIVE
Resp Syncytial Virus by PCR: NEGATIVE
SARS Coronavirus 2 by RT PCR: NEGATIVE

## 2022-10-25 LAB — MAGNESIUM: Magnesium: 2 mg/dL (ref 1.7–2.4)

## 2022-10-25 MED ORDER — CARVEDILOL 3.125 MG PO TABS: 3.1250 mg | ORAL_TABLET | Freq: Two times a day (BID) | ORAL | 3 refills | Status: DC

## 2022-10-25 NOTE — Progress Notes (Unsigned)
   MELBERT MADEIRA     MRN: [id number]      DOB: [birth year]  Chief Complaint  Patient presents with   Follow-up    ARM/shoulder pain, vision changes, sweats/chills x1 week    HPI Mr. Luhrsen is here with a 1 week h/o sweats, chills and  blurry vision for 4 days, denies pain in the eyes States he feels poorly 4 fday h/o intermittent left lower chest pain no specific aggravating or relieving factors , no accompanying light headedness, at increased risk for CAD, has never had Cardiology eval ROS Denies sinus pressure, nasal congestion, ear pain or sore throat. Denies chest congestion, productive cough or wheezing. Denies chest pains, palpitations and leg swelling Denies abdominal pain, nausea, vomiting,diarrhea or constipation.   Denies dysuria, frequency, hesitancy or incontinence. Denies joint pain, swelling and limitation in mobility. Denies headaches, seizures, numbness, or tingling.  Denies skin break down or rash.   PE  BP (!) 148/70   Pulse 81   Temp 98.4 F (36.9 C)   Ht 5\' 8"  (1.727 m)   Wt 178 lb 0.6 oz (80.8 kg)   SpO2 98%   BMI 27.07 kg/m   Patient alert and oriented and in no cardiopulmonary distress.  HEENT: No facial asymmetry, EOMI,     Neck supple .  Chest: Clear to auscultation bilaterally.  CVS: S1, S2 no murmurs, no S3.Regular rate.No reproducible chest wall pain  ABD: Soft non tender.   Ext: No edema  MS: Adequate ROM spine, shoulders, hips and knees.  Skin: Intact, no ulcerations or rash noted.  Psych: Good eye contact, normal affect. Memory intact  anxious not depressed appearing.  CNS: CN 2-12 intact, power,  normal throughout.no focal deficits noted.   Assessment & Plan  Type 2 diabetes mellitus with hyperglycemia (HCC) New onset blurred vison, blood sugar currently uncontrolled, advised ED if develops pain or acute worsening of symptoms, will also refer for eye exam  Intermittent left-sided chest pain 4 day history, none at time of  OV High CAD risk, cardiology to eval  Essential hypertension Uncontrolled start twice daily coreg , re eval in 4 to 6  weeks DASH diet and commitment to daily physical activity for a minimum of 30 minutes discussed and encouraged, as a part of hypertension management. The importance of attaining a healthy weight is also discussed.     10/25/2022    9:00 PM 10/25/2022    5:58 PM 10/25/2022    5:56 PM 10/25/2022    4:39 PM 10/25/2022    4:04 PM 10/25/2022    4:03 PM 10/14/2022    8:47 AM  BP/Weight  Systolic BP 164  409 148 158 159 153  Diastolic BP 68  79 70 74 96 74  Wt. (Lbs)  178    178.04   BMI  27.06 kg/m2    27.07 kg/m2        Fatigue  1week history with report of fever and chills with cough, non productive, CXR, CBC and chem 7

## 2022-10-25 NOTE — Patient Instructions (Signed)
F/U in 8 to 10 weeks, call if you need e sooner  New additional medication for blood pressure is coreg one twice daily , 9am and 9 pm  Labs today, cbc and diff , chem7 and EGFr, also CXR at the hospital  You are referred to Cardiology new intermittent left chest pain  For blurred vision if you have eyepain or significant change in vision, you need to go to the ED

## 2022-10-25 NOTE — ED Notes (Signed)
Visual acuity  Left eye  20/100 Right eye   20/200

## 2022-10-25 NOTE — ED Triage Notes (Signed)
Pt via POV c/o blurry vision x 3-4 days. No known injury or contributing factor. Pt has been wearing glasses since symptoms started but has no other problems with his eyes. Hx DM2, no other significant PMH reported.

## 2022-10-25 NOTE — ED Provider Notes (Signed)
[place] EMERGENCY DEPARTMENT AT [facility] Provider Note   CSN: [id number] Arrival date & time: 10/25/22  1727     History  Chief Complaint  Patient presents with   Blurred Vision    John [name] is a 62 y.o. male.  HPI Patient presents for blurry vision.  Medical history includes HLD, T2DM, anemia, HTN, anxiety, depression.  He saw his PCP earlier today.  While at his routine PCP visit, he did endorse some recent blurry vision.  He was advised to come to the ED. Patient states that he noticed blurriness to his vision approximately 4 days ago.  He did go to [employer] and buy some over-the-counter corrective lenses that have helped.  He denies using glasses at baseline.  Patient has continuous glucose monitor and states that his blood sugars have been in normal range recently.  Patient denies any other recent symptoms of concern.    Home Medications Prior to Admission medications   Medication Sig Start Date End Date Taking? Authorizing Provider  Accu-Chek FastClix Lancets MISC Use to monitor glucose twice daily 10/14/22   Dani Gobble, NP  ALPRAZolam Prudy Feeler) 1 MG tablet Take 1 tablet (1 mg total) by mouth 2 (two) times daily. 09/06/22   Myrlene Broker, MD  Blood Glucose Monitoring Suppl (ACCU-CHEK GUIDE ME) w/Device KIT 1 Device by Does not apply route 4 (four) times daily. 10/13/22   Kerri Perches, MD  carvedilol (COREG) 3.125 MG tablet Take 1 tablet (3.125 mg total) by mouth 2 (two) times daily with a meal. 10/25/22   Kerri Perches, MD  glucose blood (ACCU-CHEK GUIDE) test strip Use as instructed 10/13/22   Kerri Perches, MD  insulin glargine (LANTUS) 100 unit/mL SOPN Inject 20 Units into the skin at bedtime. 10/14/22 04/12/23  Dani Gobble, NP  lisinopril-hydrochlorothiazide (ZESTORETIC) 20-25 MG tablet Take 1 tablet by mouth daily. 04/07/22   Kerri Perches, MD  metFORMIN (GLUCOPHAGE) 500 MG tablet Take 1 tablet (500 mg total) by mouth 2  (two) times daily with a meal. 08/13/22   Kerri Perches, MD  rosuvastatin (CRESTOR) 40 MG tablet Take 1 tablet (40 mg total) by mouth daily. 08/12/22   Kerri Perches, MD      Allergies    Januvia [sitagliptin] and Poison oak extract [poison oak extract]    Review of Systems   Review of Systems  Eyes:  Positive for visual disturbance.  All other systems reviewed and are negative.   Physical Exam Updated Vital Signs BP (!) 159/79 (BP Location: Right Arm)   Pulse 70   Temp 99 F (37.2 C) (Oral)   Resp 16   Ht 5\' 8"  (1.727 m)   Wt 80.7 kg   SpO2 100%   BMI 27.06 kg/m  Physical Exam Vitals and nursing note reviewed.  Constitutional:      General: He is not in acute distress.    Appearance: Normal appearance. He is well-developed. He is not ill-appearing, toxic-appearing or diaphoretic.  HENT:     Head: Normocephalic and atraumatic.     Right Ear: External ear normal.     Left Ear: External ear normal.     Nose: Nose normal.     Mouth/Throat:     Mouth: Mucous membranes are moist.  Eyes:     Extraocular Movements: Extraocular movements intact.     Conjunctiva/sclera: Conjunctivae normal.     Comments: No abnormalities to conjunctivae or pupils.  Visual acuity is  20/100.  It does improve with his over-the-counter corrective lenses.  Cardiovascular:     Rate and Rhythm: Normal rate and regular rhythm.  Pulmonary:     Effort: Pulmonary effort is normal. No respiratory distress.  Abdominal:     General: There is no distension.     Palpations: Abdomen is soft.     Tenderness: There is no abdominal tenderness.  Musculoskeletal:        General: No swelling. Normal range of motion.     Cervical back: Normal range of motion and neck supple.     Right lower leg: No edema.     Left lower leg: No edema.  Skin:    General: Skin is warm and dry.     Capillary Refill: Capillary refill takes less than 2 seconds.     Coloration: Skin is not jaundiced or pale.   Neurological:     General: No focal deficit present.     Mental Status: He is alert and oriented to person, place, and time.     Cranial Nerves: No cranial nerve deficit.     Sensory: No sensory deficit.     Motor: No weakness.     Coordination: Coordination normal.  Psychiatric:        Mood and Affect: Mood normal.     ED Results / Procedures / Treatments   Labs (all labs ordered are listed, but only abnormal results are displayed) Labs Reviewed  CBC - Abnormal; Notable for the following components:      Result Value   RBC 3.56 (*)    Hemoglobin 11.4 (*)    HCT 34.7 (*)    All other components within normal limits  COMPREHENSIVE METABOLIC PANEL - Abnormal; Notable for the following components:   Sodium 134 (*)    Creatinine, Ser 1.28 (*)    All other components within normal limits  URINALYSIS, ROUTINE W REFLEX MICROSCOPIC - Abnormal; Notable for the following components:   Color, Urine STRAW (*)    Protein, ur 30 (*)    All other components within normal limits  DIFFERENTIAL  MAGNESIUM    EKG EKG Interpretation  Date/Time:  Tuesday Oct 25 2022 18:43:34 EDT Ventricular Rate:  65 PR Interval:  150 QRS Duration: 92 QT Interval:  403 QTC Calculation: 419 R Axis:   17 Text Interpretation: Sinus rhythm Borderline T wave abnormalities Confirmed by Gloris Manchester [phone]) on 10/25/2022 6:56:37 PM  Radiology CT HEAD WO CONTRAST  Result Date: 10/25/2022 CLINICAL DATA:  Blurry vision EXAM: CT HEAD WITHOUT CONTRAST TECHNIQUE: Contiguous axial images were obtained from the base of the skull through the vertex without intravenous contrast. RADIATION DOSE REDUCTION: This exam was performed according to the departmental dose-optimization program which includes automated exposure control, adjustment of the mA and/or kV according to patient size and/or use of iterative reconstruction technique. COMPARISON:  None Available. FINDINGS: Brain: There is no mass, hemorrhage or extra-axial  collection. The size and configuration of the ventricles and extra-axial CSF spaces are normal. The brain parenchyma is normal, without acute or chronic infarction. Vascular: No abnormal hyperdensity of the major intracranial arteries or dural venous sinuses. No intracranial atherosclerosis. Skull: The visualized skull base, calvarium and extracranial soft tissues are normal. Sinuses/Orbits: No fluid levels or advanced mucosal thickening of the visualized paranasal sinuses. No mastoid or middle ear effusion. The orbits are normal. IMPRESSION: Normal head CT. Electronically Signed   By: Deatra Robinson M.D.   On: 10/25/2022 19:21    Procedures  Procedures    Medications Ordered in ED Medications - No data to display  ED Course/ Medical Decision Making/ A&P                             Medical Decision Making Amount and/or Complexity of Data Reviewed Labs: ordered. Radiology: ordered.   Patient presents for bilateral blurry vision which he states has been present over the last 4 days.  His visual acuity is improved with [employer] glasses.  On arrival in the ED, patient is well-appearing.  Vital signs are notable for moderate hypertension.  He has no loss of visual fields.  Blurriness to his vision appears to affect central and peripheral aspects.  He states that his blood sugars have been in the normal range at home.  In the ED, blood sugar was found to be low-normal at 60.  He was given food and drink and had subsequent normalization of blood glucose.  Patient has no objective focal neurologic deficits.  Visual acuity is diminished at approximately 20/100.  This does improve with his corrective lenses which he did bring to the ED.  Workup was initiated.  Lab results and CT of head results are unremarkable.  Given his history of diabetes, patient would benefit from ophthalmology follow-up for dilated eye exam.  He may simply need prescription glasses.  He was given contact information for ophthalmology  follow-up.  He was discharged in stable condition.        Final Clinical Impression(s) / ED Diagnoses Final diagnoses:  Blurry vision, bilateral    Rx / DC Orders ED Discharge Orders     None         Gloris Manchester, MD 10/25/22 2044

## 2022-10-25 NOTE — Discharge Instructions (Signed)
Call the number below in the morning to set up a ophthalmology follow-up appointment.  Return to the emergency department for any new or worsening symptoms of concern.

## 2022-10-26 ENCOUNTER — Other Ambulatory Visit: Payer: Self-pay

## 2022-10-26 DIAGNOSIS — D539 Nutritional anemia, unspecified: Secondary | ICD-10-CM

## 2022-10-26 DIAGNOSIS — R5383 Other fatigue: Secondary | ICD-10-CM | POA: Insufficient documentation

## 2022-10-26 DIAGNOSIS — R0789 Other chest pain: Secondary | ICD-10-CM | POA: Insufficient documentation

## 2022-10-26 LAB — CBC WITH DIFFERENTIAL/PLATELET
Basophils Absolute: 0.1 10*3/uL (ref 0.0–0.2)
Basos: 1 %
EOS (ABSOLUTE): 0.4 10*3/uL (ref 0.0–0.4)
Eos: 4 %
Hematocrit: 35.4 % — ABNORMAL LOW (ref 37.5–51.0)
Hemoglobin: 11.6 g/dL — ABNORMAL LOW (ref 13.0–17.7)
Immature Grans (Abs): 0 10*3/uL (ref 0.0–0.1)
Immature Granulocytes: 0 %
Lymphocytes Absolute: 3.4 10*3/uL — ABNORMAL HIGH (ref 0.7–3.1)
Lymphs: 36 %
MCH: 31.6 pg (ref 26.6–33.0)
MCHC: 32.8 g/dL (ref 31.5–35.7)
MCV: 97 fL (ref 79–97)
Monocytes Absolute: 0.8 10*3/uL (ref 0.1–0.9)
Monocytes: 8 %
Neutrophils Absolute: 4.9 10*3/uL (ref 1.4–7.0)
Neutrophils: 51 %
Platelets: 311 10*3/uL (ref 150–450)
RBC: 3.67 x10E6/uL — ABNORMAL LOW (ref 4.14–5.80)
RDW: 12 % (ref 11.6–15.4)
WBC: 9.4 10*3/uL (ref 3.4–10.8)

## 2022-10-26 LAB — BMP8+EGFR
BUN/Creatinine Ratio: 11 (ref 10–24)
BUN: 15 mg/dL (ref 8–27)
CO2: 24 mmol/L (ref 20–29)
Calcium: 9.8 mg/dL (ref 8.6–10.2)
Chloride: 99 mmol/L (ref 96–106)
Creatinine, Ser: 1.34 mg/dL — ABNORMAL HIGH (ref 0.76–1.27)
Glucose: 75 mg/dL (ref 70–99)
Potassium: 4.4 mmol/L (ref 3.5–5.2)
Sodium: 137 mmol/L (ref 134–144)
eGFR: 60 mL/min/{1.73_m2} (ref >59)

## 2022-10-26 NOTE — Assessment & Plan Note (Signed)
1week history with report of fever and chills with cough, non productive, CXR, CBC and chem 7

## 2022-10-26 NOTE — Assessment & Plan Note (Signed)
Uncontrolled start twice daily coreg , re eval in 4 to 6  weeks DASH diet and commitment to daily physical activity for a minimum of 30 minutes discussed and encouraged, as a part of hypertension management. The importance of attaining a healthy weight is also discussed.     10/25/2022    9:00 PM 10/25/2022    5:58 PM 10/25/2022    5:56 PM 10/25/2022    4:39 PM 10/25/2022    4:04 PM 10/25/2022    4:03 PM 10/14/2022    8:47 AM  BP/Weight  Systolic BP 164  161 148 158 159 153  Diastolic BP 68  79 70 74 96 74  Wt. (Lbs)  178    178.04   BMI  27.06 kg/m2    27.07 kg/m2

## 2022-10-26 NOTE — Assessment & Plan Note (Signed)
4 day history, none at time of OV High CAD risk, cardiology to eval

## 2022-10-26 NOTE — Assessment & Plan Note (Signed)
New onset blurred vison, blood sugar currently uncontrolled, advised ED if develops pain or acute worsening of symptoms, will also refer for eye exam

## 2022-10-28 LAB — FOLATE: Folate: 6.5 ng/mL (ref >3.0)

## 2022-10-28 LAB — SPECIMEN STATUS REPORT

## 2022-10-28 LAB — IRON: Iron: 44 ug/dL (ref 38–169)

## 2022-10-28 LAB — VITAMIN B12: Vitamin B-12: 715 pg/mL (ref 232–1245)

## 2022-10-31 DIAGNOSIS — E119 Type 2 diabetes mellitus without complications: Secondary | ICD-10-CM | POA: Diagnosis not present

## 2022-11-02 ENCOUNTER — Telehealth: Payer: Self-pay

## 2022-11-02 NOTE — Telephone Encounter (Signed)
Transition Care Management Follow-up Telephone Call Date of discharge and from where: [name] 5/7 How have you been since you were released from the hospital? Doing good Any questions or concerns? No  Items Reviewed: Did the pt receive and understand the discharge instructions provided? Yes  Medications obtained and verified? Yes  Other? No  Any new allergies since your discharge? No  Dietary orders reviewed? No Do you have support at home? Yes    Follow up appointments reviewed:  PCP Hospital f/u appt confirmed? Yes  Scheduled to see PCP on 6/19 @ . Specialist Hospital f/u appt confirmed? No  Scheduled to see on  @ . Are transportation arrangements needed? No  If their condition worsens, is the pt aware to call PCP or go to the Emergency Dept.? Yes Was the patient provided with contact information for the PCP's office or ED? Yes Was to pt encouraged to call back with questions or concerns? Yes

## 2022-11-16 ENCOUNTER — Ambulatory Visit: Payer: Medicare Other

## 2022-11-16 DIAGNOSIS — E08649 Diabetes mellitus due to underlying condition with hypoglycemia without coma: Secondary | ICD-10-CM | POA: Diagnosis not present

## 2022-12-17 DIAGNOSIS — E08649 Diabetes mellitus due to underlying condition with hypoglycemia without coma: Secondary | ICD-10-CM | POA: Diagnosis not present

## 2022-12-21 ENCOUNTER — Ambulatory Visit: Payer: Medicare Other | Attending: Internal Medicine | Admitting: Internal Medicine

## 2022-12-21 ENCOUNTER — Encounter: Payer: Self-pay | Admitting: Internal Medicine

## 2022-12-21 VITALS — BP 128/64 | HR 70 | Ht 68.0 in | Wt 181.0 lb

## 2022-12-21 DIAGNOSIS — E782 Mixed hyperlipidemia: Secondary | ICD-10-CM | POA: Diagnosis not present

## 2022-12-21 DIAGNOSIS — Z794 Long term (current) use of insulin: Secondary | ICD-10-CM

## 2022-12-21 DIAGNOSIS — I10 Essential (primary) hypertension: Secondary | ICD-10-CM

## 2022-12-21 DIAGNOSIS — R0789 Other chest pain: Secondary | ICD-10-CM | POA: Diagnosis not present

## 2022-12-21 DIAGNOSIS — E1165 Type 2 diabetes mellitus with hyperglycemia: Secondary | ICD-10-CM | POA: Diagnosis not present

## 2022-12-21 NOTE — Progress Notes (Signed)
Cardiology Office Note  Date: 12/21/2022   ID: John [name], DOB July 10, 1960, MRN [id number]  PCP:  Kerri Perches, MD  Cardiologist:  Marjo Bicker, MD Electrophysiologist:  None   Reason for Office Visit: chest pain eval   History of Present Illness: John [name] is a 62 y.o. male known to have HTN, DM 2, HLD was referred to cardiology clinic for evaluation of chest pain.  Patient had chest pain lasting for a few seconds when he was lifting heavy weights in the last 3 to 4 months.  This pain was worse with inspiration as well.  3-4 episodes in the last 3 to 4 months.  No chest pain with exertion.  No DOE, dizziness, palpitations, syncope or leg swelling.  Denies smoking cigarettes.  Past Medical History:  Diagnosis Date   Anxiety    Depression 2007   Diabetes mellitus without complication (HCC)    Helicobacter pylori ab+ 01/21/2019   Dx in 12/2018, will treat   Hyperlipidemia    Hypertension    Hypogonadism male    Obesity (BMI 30.0-34.9) 03/01/2017    Past Surgical History:  Procedure Laterality Date   CIRCUMCISION N/A 06/10/2019   Procedure: CIRCUMCISION ADULT;  Surgeon: Malen Gauze, MD;  Location: [facility]S;  Service: Urology;  Laterality: N/A;  30 MINS   COLONOSCOPY  01/20/2011   Dr. Darrick Penna: large internal hemorrhoids   COLONOSCOPY WITH PROPOFOL N/A 09/21/2020   Procedure: COLONOSCOPY WITH PROPOFOL;  Surgeon: Lanelle Bal, DO;  Location: [facility] ENDO SUITE;  Service: Endoscopy;  Laterality: N/A;  AM-diabetic   HERNIA REPAIR     ventral hernia repair    VASECTOMY      Current Outpatient Medications  Medication Sig Dispense Refill   Accu-Chek FastClix Lancets MISC Use to monitor glucose twice daily 102 each 6   ALPRAZolam (XANAX) 1 MG tablet Take 1 tablet (1 mg total) by mouth 2 (two) times daily. 60 tablet 3   Blood Glucose Monitoring Suppl (ACCU-CHEK GUIDE ME) w/Device KIT 1 Device by Does not apply route 4 (four) times daily. 1 kit 0   carvedilol  (COREG) 3.125 MG tablet Take 1 tablet (3.125 mg total) by mouth 2 (two) times daily with a meal. 60 tablet 3   glucose blood (ACCU-CHEK GUIDE) test strip Use as instructed 100 each 12   insulin glargine (LANTUS) 100 unit/mL SOPN Inject 20 Units into the skin at bedtime. 18 mL 3   lisinopril-hydrochlorothiazide (ZESTORETIC) 20-25 MG tablet Take 1 tablet by mouth daily. 90 tablet 3   metFORMIN (GLUCOPHAGE) 500 MG tablet Take 1 tablet (500 mg total) by mouth 2 (two) times daily with a meal. 60 tablet 5   rosuvastatin (CRESTOR) 40 MG tablet Take 1 tablet (40 mg total) by mouth daily. 90 tablet 1   traZODone (DESYREL) 150 MG tablet Take 150 mg by mouth at bedtime.     No current facility-administered medications for this visit.   Allergies:  Januvia [sitagliptin] and Poison oak extract [poison oak extract]   Social History: The patient  reports that he has never smoked. He has never used smokeless tobacco. He reports current alcohol use of about 1.0 standard drink of alcohol per week. He reports that he does not use drugs.   Family History: The patient's family history includes Heart disease in his mother; Heart failure in his mother; Hyperlipidemia in his mother; Hypertension in his brother, mother, and sister.   ROS:  Please see the history of  present illness. Otherwise, complete review of systems is positive for none.  All other systems are reviewed and negative.   Physical Exam: VS:  BP 128/64   Pulse 70   Ht 5\' 8"  (1.727 m)   Wt 181 lb (82.1 kg)   SpO2 97%   BMI 27.52 kg/m , BMI Body mass index is 27.52 kg/m.  Wt Readings from Last 3 Encounters:  12/21/22 181 lb (82.1 kg)  10/25/22 178 lb (80.7 kg)  10/25/22 178 lb 0.6 oz (80.8 kg)    General: Patient appears comfortable at rest. HEENT: Conjunctiva and lids normal, oropharynx clear with moist mucosa. Neck: Supple, no elevated JVP or carotid bruits, no thyromegaly. Lungs: Clear to auscultation, nonlabored breathing at  rest. Cardiac: Regular rate and rhythm, no S3 or significant systolic murmur, no pericardial rub. Abdomen: Soft, nontender, no hepatomegaly, bowel sounds present, no guarding or rebound. Extremities: No pitting edema, distal pulses 2+. Skin: Warm and dry. Musculoskeletal: No kyphosis. Neuropsychiatric: Alert and oriented x3, affect grossly appropriate.  Recent Labwork: 10/25/2022: ALT 17; AST 19; BUN 16; Creatinine, Ser 1.28; Hemoglobin 11.4; Magnesium 2.0; Platelets 290; Potassium 4.2; Sodium 134     Component Value Date/Time   CHOL 255 (H) 08/09/2022 1128   TRIG 155 (H) 08/09/2022 1128   HDL 42 08/09/2022 1128   CHOLHDL 6.1 (H) 08/09/2022 1128   CHOLHDL 4.2 01/07/2020 0729   VLDL 64 (H) 12/30/2016 0950   LDLCALC 184 (H) 08/09/2022 1128   LDLCALC 99 01/07/2020 0729     Assessment and Plan:  # Noncardiac chest pain -Patient had chest pain lasting for few seconds when he lifted heavy weights and pain was made worse with inspiration as well.  These episodes occurred 3-4 times in the last 3 to 4 months. No chest pain with exertion at all.  Will defer stress testing for now.  # HTN, controlled -His blood pressures were mildly elevated and his PCP started him on carvedilol 3.125 mg twice daily after which his blood pressures stayed normal. Continue remaining antihypertensive, lisinopril-HCTZ 20-25 mg once daily.  # HLD, at goal -Continue rosuvastatin 40 mg nightly, goal LDL less than 100.  I have spent a total of 45 minutes with patient reviewing chart, EKGs, labs and examining patient as well as establishing an assessment and plan that was discussed with the patient.  > 50% of time was spent in direct patient care.    Medication Adjustments/Labs and Tests Ordered: Current medicines are reviewed at length with the patient today.  Concerns regarding medicines are outlined above.   Tests Ordered: No orders of the defined types were placed in this encounter.   Medication Changes: No  orders of the defined types were placed in this encounter.   Disposition:  Follow up prn  Signed, Keshara Kiger Verne Spurr, MD, 12/21/2022 10:03 AM    [facility] [address] [place], [place] [address]

## 2022-12-21 NOTE — Patient Instructions (Signed)
Medication Instructions:  Your physician recommends that you continue on your current medications as directed. Please refer to the Current Medication list given to you today.   Labwork: None  Testing/Procedures: None  Follow-Up: Your physician recommends that you schedule a follow-up appointment in: As Needed  Any Other Special Instructions Will Be Listed Below (If Applicable).     If you need a refill on your cardiac medications before your next appointment, please call your pharmacy.   

## 2022-12-27 ENCOUNTER — Other Ambulatory Visit (HOSPITAL_COMMUNITY): Payer: Self-pay | Admitting: Psychiatry

## 2022-12-27 ENCOUNTER — Ambulatory Visit: Payer: Medicare Other | Admitting: "Endocrinology

## 2022-12-27 DIAGNOSIS — F322 Major depressive disorder, single episode, severe without psychotic features: Secondary | ICD-10-CM

## 2022-12-28 ENCOUNTER — Ambulatory Visit (INDEPENDENT_AMBULATORY_CARE_PROVIDER_SITE_OTHER): Payer: Medicare Other | Admitting: Family Medicine

## 2022-12-28 ENCOUNTER — Encounter: Payer: Self-pay | Admitting: Family Medicine

## 2022-12-28 ENCOUNTER — Telehealth: Payer: Self-pay | Admitting: Nurse Practitioner

## 2022-12-28 VITALS — BP 130/81 | HR 98 | Ht 68.0 in | Wt 185.1 lb

## 2022-12-28 DIAGNOSIS — I10 Essential (primary) hypertension: Secondary | ICD-10-CM | POA: Diagnosis not present

## 2022-12-28 DIAGNOSIS — E1165 Type 2 diabetes mellitus with hyperglycemia: Secondary | ICD-10-CM | POA: Diagnosis not present

## 2022-12-28 DIAGNOSIS — Z794 Long term (current) use of insulin: Secondary | ICD-10-CM | POA: Diagnosis not present

## 2022-12-28 DIAGNOSIS — E559 Vitamin D deficiency, unspecified: Secondary | ICD-10-CM

## 2022-12-28 DIAGNOSIS — F322 Major depressive disorder, single episode, severe without psychotic features: Secondary | ICD-10-CM

## 2022-12-28 DIAGNOSIS — E782 Mixed hyperlipidemia: Secondary | ICD-10-CM | POA: Diagnosis not present

## 2022-12-28 DIAGNOSIS — F411 Generalized anxiety disorder: Secondary | ICD-10-CM

## 2022-12-28 NOTE — Telephone Encounter (Signed)
Dexcom G7 refill requested through [employer]

## 2022-12-28 NOTE — Patient Instructions (Addendum)
Annual exam with MD end August, call if you need me sooner  Fasting lipid, cmp and EGFr, TSH , CBc and vit D 3 to 5 days before August appt  Nurse pls send for 10/2022 eye exam so we can document result, states went to [facility] and has new glasses  Please get Shingles vaccine today, and the tetanus vaccine in 2 to 3 weeks at your pharmacy  Urine ACR today  Work on blood sugar , I will refer you back to diabetic ed , also pls go to once monthly class    Msg is sent to Dr Tenny Craw , I am not adding any medication, keep appt next week with her  Thanks for choosing [facility], we consider it a privelige to serve you.

## 2022-12-30 LAB — MICROALBUMIN / CREATININE URINE RATIO
Creatinine, Urine: 208.6 mg/dL
Microalb/Creat Ratio: 161 mg/g creat — ABNORMAL HIGH (ref 0–29)
Microalbumin, Urine: 335.5 ug/mL

## 2023-01-01 DIAGNOSIS — F411 Generalized anxiety disorder: Secondary | ICD-10-CM | POA: Insufficient documentation

## 2023-01-01 DIAGNOSIS — F322 Major depressive disorder, single episode, severe without psychotic features: Secondary | ICD-10-CM | POA: Insufficient documentation

## 2023-01-01 NOTE — Assessment & Plan Note (Signed)
Has appt with Psyvch next week will also message Doc, knows to go to the ED  / call for help if becomes suicidal or homicidal, symptoms have developed in past 2 months

## 2023-01-01 NOTE — Progress Notes (Signed)
JAWUAN [name]     MRN: [id number]      DOB: 18-Sep-1960  Chief Complaint  Patient presents with   Follow-up    Follow up worrying, dosent want to do anything, shoulder issues    HPI [name] is here for follow up and re-evaluation of chronic medical conditions, medication management and review of any available recent lab and radiology data.  Preventive health is updated, specifically  Cancer screening and Immunization.   Questions or concerns regarding consultations or procedures which the PT has had in the interim are  addressed. The PT denies any adverse reactions to current medications since the last visit.  3 month h/o significant decompensation in mental health, depressed and anxious, not suicidal or homicidal, wants help, has appt with psych next week Blood sugar uncontrolled, not following diet and not motivated/ able to do much more however is interested in education ROS Denies recent fever or chills. Denies sinus pressure, nasal congestion, ear pain or sore throat. Denies chest congestion, productive cough or wheezing. Denies chest pains, palpitations and leg swelling Denies abdominal pain, nausea, vomiting,diarrhea or constipation.   Denies dysuria, frequency, hesitancy or incontinence. Denies joint pain, swelling and limitation in mobility. Denies headaches, seizures, numbness, or tingling.  Denies skin break down or rash.   PE  BP 130/81 (BP Location: Right Arm, Patient Position: Sitting, Cuff Size: Large)   Pulse 98   Ht 5\' 8"  (1.727 m)   Wt 185 lb 1.9 oz (84 kg)   SpO2 96%   BMI 28.15 kg/m   Patient alert and oriented and in no cardiopulmonary distress.Flat affect  HEENT: No facial asymmetry, EOMI,     Neck supple .  Chest: Clear to auscultation bilaterally.  CVS: S1, S2 no murmurs, no S3.Regular rate.  ABD: Soft non tender.   Ext: No edema  MS: Adequate ROM spine, shoulders, hips and knees.  Skin: Intact, no ulcerations or rash  noted.  Psych: poor  eye contact, flatl affect. Memory intact both anxious and  depressed appearing.  CNS: CN 2-12 intact, power,  normal throughout.no focal deficits noted.   Assessment & Plan  Severe major depression without psychotic features (HCC) Has appt with Psyvch next week will also message Doc, knows to go to the ED  / call for help if becomes suicidal or homicidal, symptoms have developed in past 2 months  GAD (generalized anxiety disorder) Currently being treated by psych , will send message , has appt next week  Essential hypertension DASH diet and commitment to daily physical activity for a minimum of 30 minutes discussed and encouraged, as a part of hypertension management. The importance of attaining a healthy weight is also discussed.     12/28/2022    9:48 AM 12/21/2022    9:33 AM 10/25/2022    9:00 PM 10/25/2022    5:58 PM 10/25/2022    5:56 PM 10/25/2022    4:39 PM 10/25/2022    4:04 PM  BP/Weight  Systolic BP 130 128 164  159 148 158  Diastolic BP 81 64 68  79 70 74  Wt. (Lbs) 185.12 181  178     BMI 28.15 kg/m2 27.52 kg/m2  27.06 kg/m2        Controlled, no change in medication   Mixed hyperlipidemia Hyperlipidemia:Low fat diet discussed and encouraged.   Lipid Panel  Lab Results  Component Value Date   CHOL 255 (H) 08/09/2022   HDL 42 08/09/2022   LDLCALC 184 (H)  08/09/2022   TRIG 155 (H) 08/09/2022   CHOLHDL 6.1 (H) 08/09/2022     Uncontrolled , needs pre visit labs, nutrition counselling done briefly  Type 2 diabetes mellitus with hyperglycemia [facility]) John [name] is reminded of the importance of commitment to daily physical activity for 30 minutes or more, as able and the need to limit carbohydrate intake to 30 to 60 grams per meal to help with blood sugar control.   The need to take medication as prescribed, test blood sugar as directed, and to call between visits if there is a concern that blood sugar is uncontrolled is also discussed.   Mr.  [name] is reminded of the importance of daily foot exam, annual eye examination, and good blood sugar, blood pressure and cholesterol control. Manged by Endo, uncontrolled, referred to individual class for education, htough mental health poor     Latest Ref Rng & Units 12/28/2022   10:40 AM 10/25/2022    6:40 PM 10/25/2022    4:50 PM 08/09/2022   11:28 AM 04/07/2022   11:34 AM  Diabetic Labs  HbA1c 4.8 - 5.6 %    10.8  5.9   Micro/Creat Ratio 0 - 29 mg/g creat 161       Chol 100 - 199 mg/dL    409  811   HDL >91 mg/dL    42  41   Calc LDL 0 - 99 mg/dL    478  295   Triglycerides 0 - 149 mg/dL    621  308   Creatinine 0.61 - 1.24 mg/dL  6.57  8.46  9.62  9.52       12/28/2022    9:48 AM 12/21/2022    9:33 AM 10/25/2022    9:00 PM 10/25/2022    5:58 PM 10/25/2022    5:56 PM 10/25/2022    4:39 PM 10/25/2022    4:04 PM  BP/Weight  Systolic BP 130 128 164  159 148 158  Diastolic BP 81 64 68  79 70 74  Wt. (Lbs) 185.12 181  178     BMI 28.15 kg/m2 27.52 kg/m2  27.06 kg/m2         Latest Ref Rng & Units 06/17/2021   12:00 AM 07/09/2019    3:59 PM  Foot/eye exam completion dates  Eye Exam No Retinopathy No Retinopathy     No Retinopathy         This result is from an external source.

## 2023-01-01 NOTE — Assessment & Plan Note (Signed)
DASH diet and commitment to daily physical activity for a minimum of 30 minutes discussed and encouraged, as a part of hypertension management. The importance of attaining a healthy weight is also discussed.     12/28/2022    9:48 AM 12/21/2022    9:33 AM 10/25/2022    9:00 PM 10/25/2022    5:58 PM 10/25/2022    5:56 PM 10/25/2022    4:39 PM 10/25/2022    4:04 PM  BP/Weight  Systolic BP 130 128 164  159 148 158  Diastolic BP 81 64 68  79 70 74  Wt. (Lbs) 185.12 181  178     BMI 28.15 kg/m2 27.52 kg/m2  27.06 kg/m2        Controlled, no change in medication

## 2023-01-01 NOTE — Assessment & Plan Note (Signed)
Hyperlipidemia:Low fat diet discussed and encouraged.   Lipid Panel  Lab Results  Component Value Date   CHOL 255 (H) 08/09/2022   HDL 42 08/09/2022   LDLCALC 184 (H) 08/09/2022   TRIG 155 (H) 08/09/2022   CHOLHDL 6.1 (H) 08/09/2022     Uncontrolled , needs pre visit labs, nutrition counselling done briefly

## 2023-01-01 NOTE — Assessment & Plan Note (Addendum)
Mr. Phibbs is reminded of the importance of commitment to daily physical activity for 30 minutes or more, as able and the need to limit carbohydrate intake to 30 to 60 grams per meal to help with blood sugar control.   The need to take medication as prescribed, test blood sugar as directed, and to call between visits if there is a concern that blood sugar is uncontrolled is also discussed.   Mr. Strojny is reminded of the importance of daily foot exam, annual eye examination, and good blood sugar, blood pressure and cholesterol control. Manged by Endo, uncontrolled, referred to individual class for education, htough mental health poor     Latest Ref Rng & Units 12/28/2022   10:40 AM 10/25/2022    6:40 PM 10/25/2022    4:50 PM 08/09/2022   11:28 AM 04/07/2022   11:34 AM  Diabetic Labs  HbA1c 4.8 - 5.6 %    10.8  5.9   Micro/Creat Ratio 0 - 29 mg/g creat 161       Chol 100 - 199 mg/dL    161  096   HDL >04 mg/dL    42  41   Calc LDL 0 - 99 mg/dL    540  981   Triglycerides 0 - 149 mg/dL    191  478   Creatinine 0.61 - 1.24 mg/dL  2.95  6.21  3.08  6.57       12/28/2022    9:48 AM 12/21/2022    9:33 AM 10/25/2022    9:00 PM 10/25/2022    5:58 PM 10/25/2022    5:56 PM 10/25/2022    4:39 PM 10/25/2022    4:04 PM  BP/Weight  Systolic BP 130 128 164  159 148 158  Diastolic BP 81 64 68  79 70 74  Wt. (Lbs) 185.12 181  178     BMI 28.15 kg/m2 27.52 kg/m2  27.06 kg/m2         Latest Ref Rng & Units 06/17/2021   12:00 AM 07/09/2019    3:59 PM  Foot/eye exam completion dates  Eye Exam No Retinopathy No Retinopathy     No Retinopathy         This result is from an external source.

## 2023-01-01 NOTE — Assessment & Plan Note (Signed)
Currently being treated by psych , will send message , has appt next week

## 2023-01-04 ENCOUNTER — Other Ambulatory Visit (HOSPITAL_COMMUNITY): Payer: Self-pay | Admitting: Psychiatry

## 2023-01-06 ENCOUNTER — Telehealth (HOSPITAL_COMMUNITY): Payer: Medicare Other | Admitting: Psychiatry

## 2023-01-11 ENCOUNTER — Encounter (HOSPITAL_COMMUNITY): Payer: Self-pay | Admitting: Psychiatry

## 2023-01-11 ENCOUNTER — Telehealth (INDEPENDENT_AMBULATORY_CARE_PROVIDER_SITE_OTHER): Payer: Medicare Other | Admitting: Psychiatry

## 2023-01-11 DIAGNOSIS — F322 Major depressive disorder, single episode, severe without psychotic features: Secondary | ICD-10-CM | POA: Diagnosis not present

## 2023-01-11 MED ORDER — ALPRAZOLAM 1 MG PO TABS: 1.0000 mg | ORAL_TABLET | Freq: Two times a day (BID) | ORAL | 0 refills | Status: DC

## 2023-01-11 MED ORDER — TRAZODONE HCL 150 MG PO TABS: 150.0000 mg | ORAL_TABLET | Freq: Every day | ORAL | 0 refills | Status: DC

## 2023-01-11 MED ORDER — FLUOXETINE HCL 20 MG PO CAPS: 20.0000 mg | ORAL_CAPSULE | Freq: Every day | ORAL | 2 refills | Status: DC

## 2023-01-11 NOTE — Progress Notes (Signed)
Virtual Visit via Telephone Note  I connected with John [name] on 01/11/23 at  9:20 AM EDT by telephone and verified that I am speaking with the correct person using two identifiers.  Location: Patient: home Provider: office   I discussed the limitations, risks, security and privacy concerns of performing an evaluation and management service by telephone and the availability of in person appointments. I also discussed with the patient that there may be a patient responsible charge related to this service. The patient expressed understanding and agreed to proceed.       I discussed the assessment and treatment plan with the patient. The patient was provided an opportunity to ask questions and all were answered. The patient agreed with the plan and demonstrated an understanding of the instructions.   The patient was advised to call back or seek an in-person evaluation if the symptoms worsen or if the condition fails to improve as anticipated.  I provided 15 minutes of non-face-to-face time during this encounter.   Diannia Ruder, MD  [facility] MD/PA/NP OP Progress Note  01/11/2023 9:37 AM John [name]  MRN:  [id number]  Chief Complaint:  Chief Complaint  Patient presents with   Depression   Anxiety   Follow-up   HPI: This patient is a 62 year old black male who is back living with his wife and rambling.  He had 1 son who died in 75 at age 62.  He is on disability.  The patient returns for follow-up after about 4 months.  Dr. Lodema Hong his PCP had messaged me recently stating that he seemed a lot more depressed.  His wife got on the call today and states that he left her about a year and a half ago for another woman.  However he found out the other woman was drinking all the time and he was miserable there.  He never told me any of this while he was away from his wife.  3 months ago he came back to living with his wife.  Since he has been back he seems to be more depressed.  He has no  energy and does not feel like doing anything.  Sometimes he has difficulty sleeping.  He is tired all the time.  Of note he also has iron deficiency anemia but claims he is taking his iron every day.  The patient is no longer taking Prozac because "I was not sure it was helping me."  Now he is thinking that maybe it did help him more than he realized.  I suggested that he go back to this and see if it can help.  He is in agreement Visit Diagnosis:    ICD-10-CM   1. Major depressive disorder, single episode, severe without psychotic features (HCC)  F32.2 ALPRAZolam (XANAX) 1 MG tablet      Past Psychiatric History: none  Past Medical History:  Past Medical History:  Diagnosis Date   Anxiety    Depression 2007   Diabetes mellitus without complication (HCC)    Helicobacter pylori ab+ 01/21/2019   Dx in 12/2018, will treat   Hyperlipidemia    Hypertension    Hypogonadism male    Obesity (BMI 30.0-34.9) 03/01/2017    Past Surgical History:  Procedure Laterality Date   CIRCUMCISION N/A 06/10/2019   Procedure: CIRCUMCISION ADULT;  Surgeon: Malen Gauze, MD;  Location: [facility];  Service: Urology;  Laterality: N/A;  30 MINS   COLONOSCOPY  01/20/2011   Dr. Darrick Penna: large internal hemorrhoids  COLONOSCOPY WITH PROPOFOL N/A 09/21/2020   Procedure: COLONOSCOPY WITH PROPOFOL;  Surgeon: Lanelle Bal, DO;  Location: [facility] SUITE;  Service: Endoscopy;  Laterality: N/A;  AM-diabetic   HERNIA REPAIR     ventral hernia repair    VASECTOMY      Family Psychiatric History: See below  Family History:  Family History  Problem Relation Age of Onset   Heart failure Mother        living    Hypertension Mother    Hyperlipidemia Mother    Heart disease Mother    Hypertension Sister    Hypertension Brother    Colon cancer Neg Hx     Social History:  Social History   Socioeconomic History   Marital status: Married    Spouse name: Not on file   Number of children: 1   Years of  education: Not on file   Highest education level: Not on file  Occupational History   Occupation: Dispensing optician  Tobacco Use   Smoking status: Never   Smokeless tobacco: Never  Vaping Use   Vaping status: Never Used  Substance and Sexual Activity   Alcohol use: Yes    Alcohol/week: 1.0 standard drink of alcohol    Types: 1 Cans of beer per week    Comment: occasional beer socially.    Drug use: No   Sexual activity: Yes  Other Topics Concern   Not on file  Social History Narrative   Not on file   Social Determinants of Health   Financial Resource Strain: Low Risk  (06/09/2021)   Overall Financial Resource Strain (CARDIA)    Difficulty of Paying Living Expenses: Not hard at all  Food Insecurity: No Food Insecurity (06/09/2021)   Hunger Vital Sign    Worried About Running Out of Food in the Last Year: Never true    Ran Out of Food in the Last Year: Never true  Transportation Needs: No Transportation Needs (06/09/2021)   PRAPARE - Administrator, Civil Service (Medical): No    Lack of Transportation (Non-Medical): No  Physical Activity: Sufficiently Active (06/09/2021)   Exercise Vital Sign    Days of Exercise per Week: 7 days    Minutes of Exercise per Session: 40 min  Stress: No Stress Concern Present (06/09/2021)   [employer] of Occupational Health - Occupational Stress Questionnaire    Feeling of Stress : Not at all  Social Connections: Moderately Integrated (06/09/2021)   Social Connection and Isolation Panel [NHANES]    Frequency of Communication with Friends and Family: More than three times a week    Frequency of Social Gatherings with Friends and Family: More than three times a week    Attends Religious Services: More than 4 times per year    Active Member of [employer] or Organizations: No    Attends Banker Meetings: Never    Marital Status: Married    Allergies:  Allergies  Allergen Reactions   Januvia [Sitagliptin] Nausea And  Vomiting   Poison Oak Extract [Poison Oak Extract] Dermatitis    Metabolic Disorder Labs: Lab Results  Component Value Date   HGBA1C 10.8 (H) 08/09/2022   MPG 114.02 06/10/2019   MPG 140 02/26/2019   No results found for: "PROLACTIN" Lab Results  Component Value Date   CHOL 255 (H) 08/09/2022   TRIG 155 (H) 08/09/2022   HDL 42 08/09/2022   CHOLHDL 6.1 (H) 08/09/2022   VLDL 64 (H) 12/30/2016  LDLCALC 184 (H) 08/09/2022   LDLCALC 146 (H) 04/07/2022   Lab Results  Component Value Date   TSH 1.830 11/04/2021   TSH 1.850 07/03/2020    Therapeutic Level Labs: No results found for: "LITHIUM" No results found for: "VALPROATE" No results found for: "CBMZ"  Current Medications: Current Outpatient Medications  Medication Sig Dispense Refill   FLUoxetine (PROZAC) 20 MG capsule Take 1 capsule (20 mg total) by mouth daily. 30 capsule 2   Accu-Chek FastClix Lancets MISC Use to monitor glucose twice daily 102 each 6   ALPRAZolam (XANAX) 1 MG tablet Take 1 tablet (1 mg total) by mouth 2 (two) times daily. 60 tablet 0   Blood Glucose Monitoring Suppl (ACCU-CHEK GUIDE ME) w/Device KIT 1 Device by Does not apply route 4 (four) times daily. 1 kit 0   carvedilol (COREG) 3.125 MG tablet Take 1 tablet (3.125 mg total) by mouth 2 (two) times daily with a meal. 60 tablet 3   glucose blood (ACCU-CHEK GUIDE) test strip Use as instructed 100 each 12   insulin glargine (LANTUS) 100 unit/mL SOPN Inject 20 Units into the skin at bedtime. 18 mL 3   lisinopril-hydrochlorothiazide (ZESTORETIC) 20-25 MG tablet Take 1 tablet by mouth daily. 90 tablet 3   metFORMIN (GLUCOPHAGE) 500 MG tablet Take 1 tablet (500 mg total) by mouth 2 (two) times daily with a meal. 60 tablet 5   rosuvastatin (CRESTOR) 40 MG tablet Take 1 tablet (40 mg total) by mouth daily. 90 tablet 1   traZODone (DESYREL) 150 MG tablet Take 1 tablet (150 mg total) by mouth at bedtime. 30 tablet 0   No current facility-administered  medications for this visit.     Musculoskeletal: Strength & Muscle Tone: na Gait & Station: na Patient leans: N/A  Psychiatric Specialty Exam: Review of Systems  Constitutional:  Positive for fatigue.  Psychiatric/Behavioral:  Positive for dysphoric mood.   All other systems reviewed and are negative.   There were no vitals taken for this visit.There is no height or weight on file to calculate BMI.  General Appearance: NA  Eye Contact:  NA  Speech:  Clear and Coherent  Volume:  Normal  Mood:  Depressed  Affect:  NA  Thought Process:  Goal Directed  Orientation:  Full (Time, Place, and Person)  Thought Content: Rumination   Suicidal Thoughts:  No  Homicidal Thoughts:  No  Memory:  Immediate;   Good Recent;   Fair Remote;   NA  Judgement:  Poor  Insight:  Lacking  Psychomotor Activity:  Decreased  Concentration:  Concentration: Fair and Attention Span: Fair  Recall:  [employer] of Knowledge: Fair  Language: Good  Akathisia:  No  Handed:  Right  AIMS (if indicated): not done  Assets:  Communication Skills Desire for Improvement Resilience Social Support  ADL's:  Intact  Cognition: Impaired,  Mild  Sleep:  Fair   Screenings: GAD-7    Flowsheet Row Office Visit from 12/28/2022 in [facility] Office Visit from 10/25/2022 in [facility] Office Visit from 08/09/2022 in [facility]  Total GAD-7 Score 17 0 0      PHQ2-9    Flowsheet Row Video Visit from 01/11/2023 in [facility] [place] Office Visit from 12/28/2022 in [facility] Office Visit from 10/25/2022 in [facility] Office Visit from 10/04/2022 in [facility] [place] [facility] Office Visit from  09/27/2022 in [facility]  PHQ-2 Total Score 4 6 0 6 1  PHQ-9 Total Score 15 21 0 -- 3      Flowsheet Row ED from 10/25/2022 in [facility]  Emergency Department at [facility] Video Visit from 03/15/2022 in [facility] [place] ED from 10/12/2021 in [facility] Emergency Department at [facility]  C-SSRS RISK CATEGORY No Risk No Risk No Risk        Assessment and Plan: This patient is a 62 year old male with a history of depression anxiety and mild cognitive impairment.  He claims he has been much more depressed recently and this may have something to do with not being on an antidepressant.  Will reinstate Prozac 20 mg daily for depression.  He will continue Xanax 1 mg twice daily as needed for anxiety and trazodone 150 mg at bedtime for sleep.    Collaboration of Care: Collaboration of Care: Primary Care Provider AEB notes are shared with PCP on the epic system  Patient/Guardian was advised Release of Information must be obtained prior to any record release in order to collaborate their care with an outside provider. Patient/Guardian was advised if they have not already done so to contact the registration department to sign all necessary forms in order for [place] to release information regarding their care.   Consent: Patient/Guardian gives verbal consent for treatment and assignment of benefits for services provided during this visit. Patient/Guardian expressed understanding and agreed to proceed.    Diannia Ruder, MD 01/11/2023, 9:37 AM

## 2023-01-16 DIAGNOSIS — E08649 Diabetes mellitus due to underlying condition with hypoglycemia without coma: Secondary | ICD-10-CM | POA: Diagnosis not present

## 2023-01-17 ENCOUNTER — Encounter: Payer: Self-pay | Admitting: Nurse Practitioner

## 2023-01-17 ENCOUNTER — Ambulatory Visit: Payer: Medicare Other | Admitting: Nurse Practitioner

## 2023-01-17 VITALS — BP 130/72 | HR 72 | Ht 68.0 in | Wt 187.6 lb

## 2023-01-17 DIAGNOSIS — E1165 Type 2 diabetes mellitus with hyperglycemia: Secondary | ICD-10-CM

## 2023-01-17 DIAGNOSIS — Z794 Long term (current) use of insulin: Secondary | ICD-10-CM | POA: Diagnosis not present

## 2023-01-17 DIAGNOSIS — Z7984 Long term (current) use of oral hypoglycemic drugs: Secondary | ICD-10-CM | POA: Diagnosis not present

## 2023-01-17 LAB — POCT GLYCOSYLATED HEMOGLOBIN (HGB A1C): Hemoglobin A1C: 6.6 % — AB (ref 4.0–5.6)

## 2023-01-17 MED ORDER — BD PEN NEEDLE SHORT U/F 31G X 8 MM MISC: 3 refills | Status: DC

## 2023-01-17 MED ORDER — LANTUS SOLOSTAR 100 UNIT/ML ~~LOC~~ SOPN: 20.0000 [IU] | PEN_INJECTOR | Freq: Every day | SUBCUTANEOUS | 3 refills | Status: DC

## 2023-01-17 MED ORDER — METFORMIN HCL 500 MG PO TABS: 500.0000 mg | ORAL_TABLET | Freq: Two times a day (BID) | ORAL | 3 refills | Status: DC

## 2023-01-17 NOTE — Progress Notes (Signed)
01/17/2023    Endocrinology Follow Up Visit    Subjective:    Patient ID: John [name], male    DOB: July 07, 1960. Patient is being engaged in follow-up for management of chronically uncontrolled type 2 diabetes, hyperlipidemia, hypertension. PMD:   Kerri Perches, MD  Past Medical History:  Diagnosis Date   Anxiety    Depression 2007   Diabetes mellitus without complication (HCC)    Helicobacter pylori ab+ 01/21/2019   Dx in 12/2018, will treat   Hyperlipidemia    Hypertension    Hypogonadism male    Obesity (BMI 30.0-34.9) 03/01/2017   Past Surgical History:  Procedure Laterality Date   CIRCUMCISION N/A 06/10/2019   Procedure: CIRCUMCISION ADULT;  Surgeon: Malen Gauze, MD;  Location: [facility];  Service: Urology;  Laterality: N/A;  30 MINS   COLONOSCOPY  01/20/2011   Dr. Darrick Penna: large internal hemorrhoids   COLONOSCOPY WITH PROPOFOL N/A 09/21/2020   Procedure: COLONOSCOPY WITH PROPOFOL;  Surgeon: Lanelle Bal, DO;  Location: [facility];  Service: Endoscopy;  Laterality: N/A;  AM-diabetic   HERNIA REPAIR     ventral hernia repair    VASECTOMY     Social History   Socioeconomic History   Marital status: Married    Spouse name: Not on file   Number of children: 1   Years of education: Not on file   Highest education level: Not on file  Occupational History   Occupation: Dispensing optician  Tobacco Use   Smoking status: Never   Smokeless tobacco: Never  Vaping Use   Vaping status: Never Used  Substance and Sexual Activity   Alcohol use: Yes    Alcohol/week: 1.0 standard drink of alcohol    Types: 1 Cans of beer per week    Comment: occasional beer socially.    Drug use: No   Sexual activity: Yes  Other Topics Concern   Not on file  Social History Narrative   Not on file   Social Determinants of Health   Financial Resource Strain: Low Risk  (06/09/2021)   Overall Financial Resource Strain (CARDIA)    Difficulty of Paying Living Expenses: Not hard  at all  Food Insecurity: No Food Insecurity (06/09/2021)   Hunger Vital Sign    Worried About Running Out of Food in the Last Year: Never true    Ran Out of Food in the Last Year: Never true  Transportation Needs: No Transportation Needs (06/09/2021)   PRAPARE - Administrator, Civil Service (Medical): No    Lack of Transportation (Non-Medical): No  Physical Activity: Sufficiently Active (06/09/2021)   Exercise Vital Sign    Days of Exercise per Week: 7 days    Minutes of Exercise per Session: 40 min  Stress: No Stress Concern Present (06/09/2021)   [employer] of Occupational Health - Occupational Stress Questionnaire    Feeling of Stress : Not at all  Social Connections: Moderately Integrated (06/09/2021)   Social Connection and Isolation Panel [NHANES]    Frequency of Communication with Friends and Family: More than three times a week    Frequency of Social Gatherings with Friends and Family: More than three times a week    Attends Religious Services: More than 4 times per year    Active Member of [employer] or Organizations: No    Attends Banker Meetings: Never    Marital Status: Married   Outpatient Encounter Medications as of 01/17/2023  Medication Sig  Accu-Chek FastClix Lancets MISC Use to monitor glucose twice daily   ALPRAZolam (XANAX) 1 MG tablet Take 1 tablet (1 mg total) by mouth 2 (two) times daily.   Blood Glucose Monitoring Suppl (ACCU-CHEK GUIDE ME) w/Device KIT 1 Device by Does not apply route 4 (four) times daily.   carvedilol (COREG) 3.125 MG tablet Take 1 tablet (3.125 mg total) by mouth 2 (two) times daily with a meal.   FLUoxetine (PROZAC) 20 MG capsule Take 1 capsule (20 mg total) by mouth daily.   glucose blood (ACCU-CHEK GUIDE) test strip Use as instructed   insulin glargine (LANTUS SOLOSTAR) 100 UNIT/ML Solostar Pen Inject 20 Units into the skin at bedtime.   Insulin Pen Needle (B-D ULTRAFINE III SHORT PEN) 31G X 8 MM MISC Use to  inject insulin once daily   lisinopril-hydrochlorothiazide (ZESTORETIC) 20-25 MG tablet Take 1 tablet by mouth daily.   rosuvastatin (CRESTOR) 40 MG tablet Take 1 tablet (40 mg total) by mouth daily.   traZODone (DESYREL) 150 MG tablet Take 1 tablet (150 mg total) by mouth at bedtime.   [DISCONTINUED] insulin glargine (LANTUS) 100 unit/mL SOPN Inject 20 Units into the skin at bedtime.   [DISCONTINUED] metFORMIN (GLUCOPHAGE) 500 MG tablet Take 1 tablet (500 mg total) by mouth 2 (two) times daily with a meal.   metFORMIN (GLUCOPHAGE) 500 MG tablet Take 1 tablet (500 mg total) by mouth 2 (two) times daily with a meal.   No facility-administered encounter medications on file as of 01/17/2023.   ALLERGIES: Allergies  Allergen Reactions   Januvia [Sitagliptin] Nausea And Vomiting   Poison Oak Extract [Poison Oak Extract] Dermatitis   VACCINATION STATUS: Immunization History  Administered Date(s) Administered   Influenza Whole 03/18/2010, 04/13/2011   Influenza,inj,Quad PF,6+ Mos 05/20/2013, 05/08/2014, 06/11/2015, 04/18/2017, 05/23/2018, 03/18/2019, 02/12/2020, 05/07/2021, 04/07/2022   Janssen (J&J) SARS-COV-2 Vaccination 11/02/2019   PNEUMOCOCCAL CONJUGATE-20 05/07/2021   Pfizer Covid-19 Vaccine Bivalent Booster 32yrs & up 06/21/2021   Pneumococcal Polysaccharide-23 11/26/2013, 03/18/2019   Td 09/15/2009   Zoster Recombinant(Shingrix) 06/21/2021    Diabetes He presents for his follow-up diabetic visit. He has type 2 diabetes mellitus. Onset time: He was diagnosed at age 60. His disease course has been improving. There are no hypoglycemic associated symptoms. Pertinent negatives for hypoglycemia include no headaches or seizures. Associated symptoms include fatigue. Pertinent negatives for diabetes include no blurred vision, no chest pain, no polydipsia, no polyuria and no visual change. There are no hypoglycemic complications. Symptoms are improving. Diabetic complications include nephropathy.  Risk factors for coronary artery disease include dyslipidemia, diabetes mellitus, hypertension, male sex, obesity and sedentary lifestyle. Current diabetic treatment includes insulin injections and oral agent (monotherapy). He is compliant with treatment most of the time. His weight is fluctuating minimally. He is following a generally healthy (admits to drinking sweet tea at times) diet. When asked about meal planning, he reported none. He has not had a previous visit with a dietitian. He participates in exercise three times a week. His home blood glucose trend is decreasing steadily. His overall blood glucose range is 140-180 mg/dl. (He presents today with his CGM showing mostly at target glycemic profile.  His POCT A1c today is 6.6%, improving from last visit of 10.8%.  Analysis of his CGM shows TIR 52%, TAR 48%, TBR 0% with a GMI of 7.7%.  He notes he has been battling depression, is getting treatment for it.  He denies any hypoglycemia.) An ACE inhibitor/angiotensin II receptor blocker is being taken. He does  not see a podiatrist.Eye exam is current.  Hyperlipidemia This is a chronic problem. The current episode started more than 1 year ago. The problem is uncontrolled. Recent lipid tests were reviewed and are high. Exacerbating diseases include chronic renal disease, diabetes and obesity. Factors aggravating his hyperlipidemia include fatty foods. Pertinent negatives include no chest pain, myalgias or shortness of breath. Current antihyperlipidemic treatment includes statins. The current treatment provides mild improvement of lipids. Compliance problems include adherence to diet and adherence to exercise.  Risk factors for coronary artery disease include diabetes mellitus, hypertension, male sex, obesity, a sedentary lifestyle and dyslipidemia.  Hypertension This is a chronic problem. The current episode started more than 1 year ago. The problem has been gradually improving since onset. The problem is  controlled. Pertinent negatives include no blurred vision, chest pain, headaches, palpitations or shortness of breath. There are no associated agents to hypertension. Risk factors for coronary artery disease include dyslipidemia, diabetes mellitus, male gender, obesity and sedentary lifestyle. Past treatments include ACE inhibitors, diuretics and calcium channel blockers. The current treatment provides moderate improvement. Compliance problems include diet and exercise.  Hypertensive end-organ damage includes kidney disease. Identifiable causes of hypertension include chronic renal disease.    Review of systems  Constitutional: + Minimally fluctuating body weight,  current Body mass index is 28.52 kg/m. , no fatigue, no subjective hyperthermia, no subjective hypothermia Eyes: + blurry vision, no xerophthalmia ENT: no sore throat, no nodules palpated in throat, no dysphagia/odynophagia, no hoarseness Cardiovascular: no chest pain, no shortness of breath, no palpitations, no leg swelling Respiratory: no cough, no shortness of breath Gastrointestinal: no nausea/vomiting/diarrhea Musculoskeletal: no muscle/joint aches Skin: no rashes, no hyperemia Neurological: no tremors, no numbness, no tingling, no dizziness Psychiatric: no depression, no anxiety   Objective:    BP 130/72 (BP Location: Left Arm, Patient Position: Sitting, Cuff Size: Large)   Pulse 72   Ht 5\' 8"  (1.727 m)   Wt 187 lb 9.6 oz (85.1 kg)   BMI 28.52 kg/m   Wt Readings from Last 3 Encounters:  01/17/23 187 lb 9.6 oz (85.1 kg)  12/28/22 185 lb 1.9 oz (84 kg)  12/21/22 181 lb (82.1 kg)    BP Readings from Last 3 Encounters:  01/17/23 130/72  12/28/22 130/81  12/21/22 128/64     Physical Exam- Limited  Constitutional:  Body mass index is 28.52 kg/m. , not in acute distress, normal state of mind Eyes:  EOMI, no exophthalmos Musculoskeletal: no gross deformities, strength intact in all four extremities, no gross  restriction of joint movements Skin:  no rashes, no hyperemia Neurological: no tremor with outstretched hands   CMP     Component Value Date/Time   NA 134 (L) 10/25/2022 1840   NA 137 10/25/2022 1650   K 4.2 10/25/2022 1840   CL 99 10/25/2022 1840   CO2 27 10/25/2022 1840   GLUCOSE 83 10/25/2022 1840   GLUCOSE 75 10/25/2022 1650   BUN 16 10/25/2022 1840   BUN 15 10/25/2022 1650   CREATININE 1.28 (H) 10/25/2022 1840   CREATININE 1.38 (H) 01/07/2020 0729   CALCIUM 9.2 10/25/2022 1840   PROT 7.8 10/25/2022 1840   PROT 7.5 08/09/2022 1128   ALBUMIN 4.1 10/25/2022 1840   ALBUMIN 4.3 08/09/2022 1128   AST 19 10/25/2022 1840   ALT 17 10/25/2022 1840   ALKPHOS 73 10/25/2022 1840   BILITOT 0.5 10/25/2022 1840   BILITOT 0.5 08/09/2022 1128   GFRNONAA >60 10/25/2022 1840   GFRNONAA  56 (L) 01/07/2020 0729   GFRAA 73 07/03/2020 1038   GFRAA 64 01/07/2020 0729    Lab Results  Component Value Date   HGBA1C 6.6 (A) 01/17/2023   HGBA1C 10.8 (H) 08/09/2022   HGBA1C 5.9 (H) 04/07/2022   MICROALBUR 13.8 03/18/2019   MICROALBUR 5.3 02/06/2018   MICROALBUR 134.8 (H) 12/30/2016    Lipid Panel     Component Value Date/Time   CHOL 255 (H) 08/09/2022 1128   TRIG 155 (H) 08/09/2022 1128   HDL 42 08/09/2022 1128   CHOLHDL 6.1 (H) 08/09/2022 1128   CHOLHDL 4.2 01/07/2020 0729   VLDL 64 (H) 12/30/2016 0950   LDLCALC 184 (H) 08/09/2022 1128   LDLCALC 99 01/07/2020 0729     Assessment & Plan:   1) Diabetes mellitus type 2 in obese [facility])  - Patient has currently uncontrolled symptomatic type 2 DM since  62 years of age.  He presents today with his CGM showing mostly at target glycemic profile.  His POCT A1c today is 6.6%, improving from last visit of 10.8%.  Analysis of his CGM shows TIR 52%, TAR 48%, TBR 0% with a GMI of 7.7%.  He notes he has been battling depression, is getting treatment for it.  He denies any hypoglycemia.   His diabetes is complicated by obesity , prior history  of noncompliance/nonadherence, and patient remains at a high risk for more acute and chronic complications of diabetes which include CAD, CVA, CKD, retinopathy, and neuropathy. These are all discussed in detail with the patient.  - Nutritional counseling repeated at each appointment due to patients tendency to fall back in to old habits.  - The patient admits there is a room for improvement in their diet and drink choices. -  Suggestion is made for the patient to avoid simple carbohydrates from their diet including Cakes, Sweet Desserts / Pastries, Ice Cream, Soda (diet and regular), Sweet Tea, Candies, Chips, Cookies, Sweet Pastries, Store Bought Juices, Alcohol in Excess of 1-2 drinks a day, Artificial Sweeteners, Coffee Creamer, and "Sugar-free" Products. This will help patient to have stable blood glucose profile and potentially avoid unintended weight gain.   - I encouraged the patient to switch to unprocessed or minimally processed complex starch and increased protein intake (animal or plant source), fruits, and vegetables.   - Patient is advised to stick to a routine mealtimes to eat 3 meals a day and avoid unnecessary snacks (to snack only to correct hypoglycemia).  - I have approached patient with the following individualized plan to manage diabetes and patient agrees:   -Given his at target glycemic profile overall, no changes will be made to his medications today.  He is advised to continue Lantus 20 units SQ nightly and continue Metforimin 500 mg po twice daily with meals.     -He is strongly encouraged to monitor glucose at least twice daily (using his CGM), before breakfast and before bed, and call the clinic if he has readings less than 70 or greater than 200 for 3 tests in a row.  He is aware that his insulin may need further adjustment to avoid hypoglycemia.  He is great candidate for CGM due to hypoglycemia and visual impairment, is advised to continue wearing.  - Patient will be  considered for incretin therapy as appropriate next visit.  2) BP/HTN: His blood pressure is controlled to target.  He is advised to continue Lisinopril-HCT 20-25 mg po daily.   3) Lipids/HPL: His most recent lipid panel from  08/09/22 shows uncontrolled LDL at 184.  He is advised to continue Crestor 40 mg po daily at bedtime.  Side effects and precautions discussed with him.  4) Chronic Care/Health Maintenance: -Patient is on ACEI/ARB and Statin medications and encouraged to continue to follow up with Ophthalmology, Podiatrist at least yearly or according to recommendations, and advised to stay away from smoking. I have recommended yearly flu vaccine and pneumonia vaccination at least every 5 years; moderate intensity exercise for up to 150 minutes weekly; and  sleep for at least 7 hours a day.  - I advised patient to maintain close follow up with Kerri Perches, MD for primary care needs.      I spent  40  minutes in the care of the patient today including review of labs from CMP, Lipids, Thyroid Function, Hematology (current and previous including abstractions from other facilities); face-to-face time discussing  his blood glucose readings/logs, discussing hypoglycemia and hyperglycemia episodes and symptoms, medications doses, his options of short and long term treatment based on the latest standards of care / guidelines;  discussion about incorporating lifestyle medicine;  and documenting the encounter. Risk reduction counseling performed per USPSTF guidelines to reduce obesity and cardiovascular risk factors.     Please refer to Patient Instructions for Blood Glucose Monitoring and Insulin/Medications Dosing Guide"  in media tab for additional information. Please  also refer to " Patient Self Inventory" in the Media  tab for reviewed elements of pertinent patient history.  [name] participated in the discussions, expressed understanding, and voiced agreement with the above  plans.  All questions were answered to his satisfaction. he is encouraged to contact clinic should he have any questions or concerns prior to his return visit.    Follow up plan: - Return in about 4 months (around 05/20/2023) for Diabetes F/U with A1c in office, No previsit labs, Bring meter and logs.   Ronny Bacon, [facility] [address] [place], [place] [address] Phone: [phone] Fax: [phone]  01/17/2023, 9:02 AM

## 2023-01-26 ENCOUNTER — Encounter: Payer: Medicare Other | Admitting: Family Medicine

## 2023-01-30 ENCOUNTER — Encounter: Payer: Self-pay | Admitting: Family Medicine

## 2023-02-16 DIAGNOSIS — E08649 Diabetes mellitus due to underlying condition with hypoglycemia without coma: Secondary | ICD-10-CM | POA: Diagnosis not present

## 2023-02-22 ENCOUNTER — Telehealth (INDEPENDENT_AMBULATORY_CARE_PROVIDER_SITE_OTHER): Payer: Medicare Other | Admitting: Psychiatry

## 2023-02-22 ENCOUNTER — Encounter (HOSPITAL_COMMUNITY): Payer: Self-pay | Admitting: Psychiatry

## 2023-02-22 DIAGNOSIS — F419 Anxiety disorder, unspecified: Secondary | ICD-10-CM

## 2023-02-22 DIAGNOSIS — F322 Major depressive disorder, single episode, severe without psychotic features: Secondary | ICD-10-CM

## 2023-02-22 MED ORDER — TRAZODONE HCL 150 MG PO TABS: 150.0000 mg | ORAL_TABLET | Freq: Every day | ORAL | 0 refills | Status: DC

## 2023-02-22 MED ORDER — FLUOXETINE HCL 40 MG PO CAPS: 40.0000 mg | ORAL_CAPSULE | Freq: Every day | ORAL | 2 refills | Status: DC

## 2023-02-22 MED ORDER — ALPRAZOLAM 1 MG PO TABS: 1.0000 mg | ORAL_TABLET | Freq: Two times a day (BID) | ORAL | 2 refills | Status: DC

## 2023-02-22 NOTE — Progress Notes (Signed)
Virtual Visit via Telephone Note  I connected with John [name] on 02/22/23 at  9:20 AM EDT by telephone and verified that I am speaking with the correct person using two identifiers.  Location: Patient: home Provider: office   I discussed the limitations, risks, security and privacy concerns of performing an evaluation and management service by telephone and the availability of in person appointments. I also discussed with the patient that there may be a patient responsible charge related to this service. The patient expressed understanding and agreed to proceed.     I discussed the assessment and treatment plan with the patient. The patient was provided an opportunity to ask questions and all were answered. The patient agreed with the plan and demonstrated an understanding of the instructions.   The patient was advised to call back or seek an in-person evaluation if the symptoms worsen or if the condition fails to improve as anticipated.  I provided 15 minutes of non-face-to-face time during this encounter.   Diannia Ruder, MD  [facility] MD/PA/NP OP Progress Note  02/22/2023 9:51 AM John [name]  MRN:  [id number]  Chief Complaint:  Chief Complaint  Patient presents with   Depression   Anxiety   Follow-up   HPI: This patient is a 62 year old black male who is living with his wife in [place].  He has 1 son who died at age 36 in 60.  He is on disability.  The patient returns for follow-up after 6 weeks regarding his depression and anxiety.  Last time he states he was more depressed.  He had left his wife for a while and stayed with another woman who turned out to be an alcoholic.  He had come back to his wife about 4 months ago.  He claims are now getting along much better.  However he still feels depressed and has a lot of remorse for what he did.  He spends a lot of time feeling badly about this and having some trouble sleeping.  He feels more sad all the time and has no energy  or motivation to do much of anything.  He states that he is taking the Prozac daily and I suggested that we go up to the 40 mg dosage.  The Xanax is helping his anxiety and trazodone helps him sleep to some degree.  He often wakes up but is able to go back to sleep.  He denies any thoughts of suicide or self-harm Visit Diagnosis:    ICD-10-CM   1. Major depressive disorder, single episode, severe without psychotic features (HCC)  F32.2 ALPRAZolam (XANAX) 1 MG tablet      Past Psychiatric History: none  Past Medical History:  Past Medical History:  Diagnosis Date   Anxiety    Depression 2007   Diabetes mellitus without complication (HCC)    Helicobacter pylori ab+ 01/21/2019   Dx in 12/2018, will treat   Hyperlipidemia    Hypertension    Hypogonadism male    Obesity (BMI 30.0-34.9) 03/01/2017    Past Surgical History:  Procedure Laterality Date   CIRCUMCISION N/A 06/10/2019   Procedure: CIRCUMCISION ADULT;  Surgeon: Malen Gauze, MD;  Location: [facility];  Service: Urology;  Laterality: N/A;  30 MINS   COLONOSCOPY  01/20/2011   Dr. Darrick Penna: large internal hemorrhoids   COLONOSCOPY WITH PROPOFOL N/A 09/21/2020   Procedure: COLONOSCOPY WITH PROPOFOL;  Surgeon: Lanelle Bal, DO;  Location: [facility];  Service: Endoscopy;  Laterality: N/A;  AM-diabetic  HERNIA REPAIR     ventral hernia repair    VASECTOMY      Family Psychiatric History: See below  Family History:  Family History  Problem Relation Age of Onset   Heart failure Mother        living    Hypertension Mother    Hyperlipidemia Mother    Heart disease Mother    Hypertension Sister    Hypertension Brother    Colon cancer Neg Hx     Social History:  Social History   Socioeconomic History   Marital status: Married    Spouse name: Not on file   Number of children: 1   Years of education: Not on file   Highest education level: Not on file  Occupational History   Occupation: Dispensing optician  Tobacco Use    Smoking status: Never   Smokeless tobacco: Never  Vaping Use   Vaping status: Never Used  Substance and Sexual Activity   Alcohol use: Yes    Alcohol/week: 1.0 standard drink of alcohol    Types: 1 Cans of beer per week    Comment: occasional beer socially.    Drug use: No   Sexual activity: Yes  Other Topics Concern   Not on file  Social History Narrative   Not on file   Social Determinants of Health   Financial Resource Strain: Low Risk  (06/09/2021)   Overall Financial Resource Strain (CARDIA)    Difficulty of Paying Living Expenses: Not hard at all  Food Insecurity: No Food Insecurity (06/09/2021)   Hunger Vital Sign    Worried About Running Out of Food in the Last Year: Never true    Ran Out of Food in the Last Year: Never true  Transportation Needs: No Transportation Needs (06/09/2021)   PRAPARE - Administrator, Civil Service (Medical): No    Lack of Transportation (Non-Medical): No  Physical Activity: Sufficiently Active (06/09/2021)   Exercise Vital Sign    Days of Exercise per Week: 7 days    Minutes of Exercise per Session: 40 min  Stress: No Stress Concern Present (06/09/2021)   [employer] of Occupational Health - Occupational Stress Questionnaire    Feeling of Stress : Not at all  Social Connections: Moderately Integrated (06/09/2021)   Social Connection and Isolation Panel [NHANES]    Frequency of Communication with Friends and Family: More than three times a week    Frequency of Social Gatherings with Friends and Family: More than three times a week    Attends Religious Services: More than 4 times per year    Active Member of [employer] or Organizations: No    Attends Banker Meetings: Never    Marital Status: Married    Allergies:  Allergies  Allergen Reactions   Januvia [Sitagliptin] Nausea And Vomiting   Poison Oak Extract [Poison Oak Extract] Dermatitis    Metabolic Disorder Labs: Lab Results  Component Value Date    HGBA1C 6.6 (A) 01/17/2023   MPG 114.02 06/10/2019   MPG 140 02/26/2019   No results found for: "PROLACTIN" Lab Results  Component Value Date   CHOL 255 (H) 08/09/2022   TRIG 155 (H) 08/09/2022   HDL 42 08/09/2022   CHOLHDL 6.1 (H) 08/09/2022   VLDL 64 (H) 12/30/2016   LDLCALC 184 (H) 08/09/2022   LDLCALC 146 (H) 04/07/2022   Lab Results  Component Value Date   TSH 1.830 11/04/2021   TSH 1.850 07/03/2020    Therapeutic  Level Labs: No results found for: "LITHIUM" No results found for: "VALPROATE" No results found for: "CBMZ"  Current Medications: Current Outpatient Medications  Medication Sig Dispense Refill   FLUoxetine (PROZAC) 40 MG capsule Take 1 capsule (40 mg total) by mouth daily. 30 capsule 2   Accu-Chek FastClix Lancets MISC Use to monitor glucose twice daily 102 each 6   ALPRAZolam (XANAX) 1 MG tablet Take 1 tablet (1 mg total) by mouth 2 (two) times daily. 60 tablet 2   Blood Glucose Monitoring Suppl (ACCU-CHEK GUIDE ME) w/Device KIT 1 Device by Does not apply route 4 (four) times daily. 1 kit 0   carvedilol (COREG) 3.125 MG tablet Take 1 tablet (3.125 mg total) by mouth 2 (two) times daily with a meal. 60 tablet 3   glucose blood (ACCU-CHEK GUIDE) test strip Use as instructed 100 each 12   insulin glargine (LANTUS SOLOSTAR) 100 UNIT/ML Solostar Pen Inject 20 Units into the skin at bedtime. 18 mL 3   Insulin Pen Needle (B-D ULTRAFINE III SHORT PEN) 31G X 8 MM MISC Use to inject insulin once daily 100 each 3   lisinopril-hydrochlorothiazide (ZESTORETIC) 20-25 MG tablet Take 1 tablet by mouth daily. 90 tablet 3   metFORMIN (GLUCOPHAGE) 500 MG tablet Take 1 tablet (500 mg total) by mouth 2 (two) times daily with a meal. 180 tablet 3   rosuvastatin (CRESTOR) 40 MG tablet Take 1 tablet (40 mg total) by mouth daily. 90 tablet 1   traZODone (DESYREL) 150 MG tablet Take 1 tablet (150 mg total) by mouth at bedtime. 30 tablet 0   No current facility-administered  medications for this visit.     Musculoskeletal: Strength & Muscle Tone: na Gait & Station: na Patient leans: N/A  Psychiatric Specialty Exam: Review of Systems  Psychiatric/Behavioral:  Positive for dysphoric mood.   All other systems reviewed and are negative.   There were no vitals taken for this visit.There is no height or weight on file to calculate BMI.  General Appearance: NA  Eye Contact:  NA  Speech:  Clear and Coherent  Volume:  Normal  Mood:  Depressed  Affect:  NA  Thought Process:  Goal Directed  Orientation:  Full (Time, Place, and Person)  Thought Content: Rumination   Suicidal Thoughts:  No  Homicidal Thoughts:  No  Memory:  Immediate;   Good Recent;   Good Remote;   NA  Judgement:  Fair  Insight:  Shallow  Psychomotor Activity:  Decreased  Concentration:  Concentration: Fair and Attention Span: Fair  Recall:  [employer] of Knowledge: Fair  Language: Good  Akathisia:  No  Handed:  Right  AIMS (if indicated): not done  Assets:  Communication Skills Desire for Improvement Resilience Social Support  ADL's:  Intact  Cognition: Impaired,  Mild  Sleep:  Fair   Screenings: GAD-7    Flowsheet Row Office Visit from 12/28/2022 in [facility] Office Visit from 10/25/2022 in [facility] Office Visit from 08/09/2022 in [facility]  Total GAD-7 Score 17 0 0      PHQ2-9    Flowsheet Row Video Visit from 01/11/2023 in [facility] [place] Office Visit from 12/28/2022 in [facility] Office Visit from 10/25/2022 in [facility] Office Visit from 10/04/2022 in [facility] Office Visit from 09/27/2022 in [facility]  PHQ-2 Total Score 4  6 0 6 1  PHQ-9 Total Score 15 21 0 -- 3      Flowsheet Row ED from 10/25/2022 in [facility]  Video Visit from 03/15/2022 in [facility] [place] ED from 10/12/2021 in [facility] Department at [facility]  C-SSRS RISK CATEGORY No Risk No Risk No Risk        Assessment and Plan: This patient is a 62 year old male with a history of depression anxiety and mild cognitive impairment.  He despite taking Prozac 20 mg daily he is still somewhat depressed.  Will therefore increase it to 40 mg daily.  He will continue Xanax 1 mg twice daily as needed for anxiety and trazodone 150 mg at bedtime for sleep.  He will return to see me in 6 weeks  Collaboration of Care: Collaboration of Care: Primary Care Provider AEB notes are shared with PCP on the epic system  Patient/Guardian was advised Release of Information must be obtained prior to any record release in order to collaborate their care with an outside provider. Patient/Guardian was advised if they have not already done so to contact the registration department to sign all necessary forms in order for [place] to release information regarding their care.   Consent: Patient/Guardian gives verbal consent for treatment and assignment of benefits for services provided during this visit. Patient/Guardian expressed understanding and agreed to proceed.    Diannia Ruder, MD 02/22/2023, 9:51 AM

## 2023-02-23 ENCOUNTER — Ambulatory Visit: Payer: Medicare Other | Admitting: Nutrition

## 2023-04-03 ENCOUNTER — Emergency Department (HOSPITAL_COMMUNITY)
Admission: EM | Admit: 2023-04-03 | Discharge: 2023-04-03 | Discharge status: Against Medical Advice | Payer: Medicare Other | Attending: Emergency Medicine | Admitting: Emergency Medicine

## 2023-04-03 ENCOUNTER — Encounter (HOSPITAL_COMMUNITY): Payer: Self-pay

## 2023-04-03 ENCOUNTER — Other Ambulatory Visit: Payer: Self-pay

## 2023-04-03 ENCOUNTER — Telehealth: Payer: Self-pay | Admitting: *Deleted

## 2023-04-03 DIAGNOSIS — R739 Hyperglycemia, unspecified: Secondary | ICD-10-CM | POA: Diagnosis not present

## 2023-04-03 DIAGNOSIS — Z5321 Procedure and treatment not carried out due to patient leaving prior to being seen by health care provider: Secondary | ICD-10-CM | POA: Insufficient documentation

## 2023-04-03 LAB — URINALYSIS, ROUTINE W REFLEX MICROSCOPIC
Bilirubin Urine: NEGATIVE
Glucose, UA: 150 mg/dL — AB
Ketones, ur: NEGATIVE mg/dL
Leukocytes,Ua: NEGATIVE
Nitrite: NEGATIVE
Protein, ur: 100 mg/dL — AB
Specific Gravity, Urine: 1.021 (ref 1.005–1.030)
pH: 5 (ref 5.0–8.0)

## 2023-04-03 LAB — COMPREHENSIVE METABOLIC PANEL
ALT: 28 U/L (ref 0–44)
AST: 19 U/L (ref 15–41)
Albumin: 4 g/dL (ref 3.5–5.0)
Alkaline Phosphatase: 85 U/L (ref 38–126)
Anion gap: 8 (ref 5–15)
BUN: 24 mg/dL — ABNORMAL HIGH (ref 8–23)
CO2: 27 mmol/L (ref 22–32)
Calcium: 9.3 mg/dL (ref 8.9–10.3)
Chloride: 93 mmol/L — ABNORMAL LOW (ref 98–111)
Creatinine, Ser: 1.64 mg/dL — ABNORMAL HIGH (ref 0.61–1.24)
GFR, Estimated: 47 mL/min — ABNORMAL LOW (ref >60)
Glucose, Bld: 314 mg/dL — ABNORMAL HIGH (ref 70–99)
Potassium: 4 mmol/L (ref 3.5–5.1)
Sodium: 128 mmol/L — ABNORMAL LOW (ref 135–145)
Total Bilirubin: 0.7 mg/dL (ref 0.3–1.2)
Total Protein: 7.8 g/dL (ref 6.5–8.1)

## 2023-04-03 LAB — CBC
HCT: 33.3 % — ABNORMAL LOW (ref 39.0–52.0)
Hemoglobin: 11.3 g/dL — ABNORMAL LOW (ref 13.0–17.0)
MCH: 31.7 pg (ref 26.0–34.0)
MCHC: 33.9 g/dL (ref 30.0–36.0)
MCV: 93.5 fL (ref 80.0–100.0)
Platelets: 257 10*3/uL (ref 150–400)
RBC: 3.56 MIL/uL — ABNORMAL LOW (ref 4.22–5.81)
RDW: 11.9 % (ref 11.5–15.5)
WBC: 7.4 10*3/uL (ref 4.0–10.5)
nRBC: 0 % (ref 0.0–0.2)

## 2023-04-03 NOTE — ED Triage Notes (Signed)
Pt called MD for BGL in 300-400's  Pt has no complaints  MD told pt to come to ED

## 2023-04-03 NOTE — ED Notes (Signed)
Attempted to bring pt back to room 23, pt not in lobby, informed that pt left

## 2023-04-03 NOTE — Telephone Encounter (Signed)
A message was left stating that the patient had a blood sugar earlier today of 300 and at the time of the call it was 404. I discussed this message with Whitney Reardon,NP, she recommends that the patient go to the ED or Urgent care to be checked.

## 2023-04-03 NOTE — ED Notes (Signed)
DEXCOM reading in triage 354

## 2023-04-03 NOTE — Telephone Encounter (Signed)
Patient was called and given Whitney's recommendation , says that he will go.

## 2023-04-05 DIAGNOSIS — E1165 Type 2 diabetes mellitus with hyperglycemia: Secondary | ICD-10-CM | POA: Diagnosis not present

## 2023-04-05 DIAGNOSIS — E08649 Diabetes mellitus due to underlying condition with hypoglycemia without coma: Secondary | ICD-10-CM | POA: Diagnosis not present

## 2023-04-05 NOTE — Telephone Encounter (Signed)
Noted, will review and make recommendations at that time.

## 2023-04-05 NOTE — Telephone Encounter (Signed)
Pt's wife called and said he went to the ER but left without being seen due to long wait. He is still having high readings. I asked her to bring the readings by today.

## 2023-04-06 NOTE — Telephone Encounter (Signed)
Noted  

## 2023-04-10 ENCOUNTER — Telehealth: Payer: Self-pay | Admitting: Nurse Practitioner

## 2023-04-10 NOTE — Telephone Encounter (Signed)
Pt called with high BG readings  Date Before breakfast Before lunch Before supper Bedtime  10/18 130     10/19 155     10/20 133     10/21 90       Pt taking: he is taking 40 units

## 2023-04-11 ENCOUNTER — Other Ambulatory Visit (HOSPITAL_COMMUNITY): Payer: Self-pay | Admitting: Psychiatry

## 2023-04-11 NOTE — Telephone Encounter (Signed)
Patient notified

## 2023-04-11 NOTE — Telephone Encounter (Signed)
Todays glucose looked perfect and the others aren't too far off.  Have him continue his current regimen as is.

## 2023-04-24 NOTE — Telephone Encounter (Signed)
Med refill

## 2023-05-01 ENCOUNTER — Other Ambulatory Visit: Payer: Self-pay | Admitting: Family Medicine

## 2023-05-11 ENCOUNTER — Other Ambulatory Visit: Payer: Self-pay

## 2023-05-11 ENCOUNTER — Telehealth: Payer: Self-pay | Admitting: Nurse Practitioner

## 2023-05-11 MED ORDER — LANTUS SOLOSTAR 100 UNIT/ML ~~LOC~~ SOPN: 40.0000 [IU] | PEN_INJECTOR | Freq: Every day | SUBCUTANEOUS | 0 refills | Status: DC

## 2023-05-11 NOTE — Telephone Encounter (Signed)
Rx Sent  

## 2023-05-11 NOTE — Telephone Encounter (Signed)
Pt states [facility] in [place] is needing anew prescription for Lantus due to upping his dose he has run out quicker.

## 2023-05-12 ENCOUNTER — Encounter: Payer: Self-pay | Admitting: Nurse Practitioner

## 2023-05-12 ENCOUNTER — Ambulatory Visit (INDEPENDENT_AMBULATORY_CARE_PROVIDER_SITE_OTHER): Payer: Medicare Other | Admitting: Nurse Practitioner

## 2023-05-12 VITALS — BP 117/73 | HR 76 | Ht 68.0 in | Wt 193.4 lb

## 2023-05-12 DIAGNOSIS — Z794 Long term (current) use of insulin: Secondary | ICD-10-CM

## 2023-05-12 DIAGNOSIS — E1165 Type 2 diabetes mellitus with hyperglycemia: Secondary | ICD-10-CM | POA: Diagnosis not present

## 2023-05-12 DIAGNOSIS — Z7984 Long term (current) use of oral hypoglycemic drugs: Secondary | ICD-10-CM | POA: Diagnosis not present

## 2023-05-12 DIAGNOSIS — E782 Mixed hyperlipidemia: Secondary | ICD-10-CM | POA: Diagnosis not present

## 2023-05-12 LAB — POCT GLYCOSYLATED HEMOGLOBIN (HGB A1C): Hemoglobin A1C: 8.4 % — AB (ref 4.0–5.6)

## 2023-05-12 MED ORDER — BD PEN NEEDLE SHORT U/F 31G X 8 MM MISC: 3 refills | Status: DC

## 2023-05-12 MED ORDER — LANTUS SOLOSTAR 100 UNIT/ML ~~LOC~~ SOPN: 40.0000 [IU] | PEN_INJECTOR | Freq: Every day | SUBCUTANEOUS | 0 refills | Status: DC

## 2023-05-12 MED ORDER — METFORMIN HCL 500 MG PO TABS: 500.0000 mg | ORAL_TABLET | Freq: Two times a day (BID) | ORAL | 3 refills | Status: DC

## 2023-05-12 NOTE — Progress Notes (Signed)
05/12/2023    Endocrinology Follow Up Visit    Subjective:    Patient ID: John [name], male    DOB: 10-Mar-1961. Patient is being engaged in follow-up for management of chronically uncontrolled type 2 diabetes, hyperlipidemia, hypertension. PMD:   Kerri Perches, MD  Past Medical History:  Diagnosis Date   Anxiety    Depression 2007   Diabetes mellitus without complication (HCC)    Helicobacter pylori ab+ 01/21/2019   Dx in 12/2018, will treat   Hyperlipidemia    Hypertension    Hypogonadism male    Obesity (BMI 30.0-34.9) 03/01/2017   Past Surgical History:  Procedure Laterality Date   CIRCUMCISION N/A 06/10/2019   Procedure: CIRCUMCISION ADULT;  Surgeon: Malen Gauze, MD;  Location: [facility];  Service: Urology;  Laterality: N/A;  30 MINS   COLONOSCOPY  01/20/2011   Dr. Darrick Penna: large internal hemorrhoids   COLONOSCOPY WITH PROPOFOL N/A 09/21/2020   Procedure: COLONOSCOPY WITH PROPOFOL;  Surgeon: Lanelle Bal, DO;  Location: [facility];  Service: Endoscopy;  Laterality: N/A;  AM-diabetic   HERNIA REPAIR     ventral hernia repair    VASECTOMY     Social History   Socioeconomic History   Marital status: Married    Spouse name: Not on file   Number of children: 1   Years of education: Not on file   Highest education level: Not on file  Occupational History   Occupation: Dispensing optician  Tobacco Use   Smoking status: Never   Smokeless tobacco: Never  Vaping Use   Vaping status: Never Used  Substance and Sexual Activity   Alcohol use: Yes    Alcohol/week: 1.0 standard drink of alcohol    Types: 1 Cans of beer per week    Comment: occasional beer socially.    Drug use: No   Sexual activity: Yes  Other Topics Concern   Not on file  Social History Narrative   Not on file   Social Determinants of Health   Financial Resource Strain: Low Risk  (06/09/2021)   Overall Financial Resource Strain (CARDIA)    Difficulty of Paying Living Expenses: Not hard  at all  Food Insecurity: No Food Insecurity (06/09/2021)   Hunger Vital Sign    Worried About Running Out of Food in the Last Year: Never true    Ran Out of Food in the Last Year: Never true  Transportation Needs: No Transportation Needs (06/09/2021)   PRAPARE - Administrator, Civil Service (Medical): No    Lack of Transportation (Non-Medical): No  Physical Activity: Sufficiently Active (06/09/2021)   Exercise Vital Sign    Days of Exercise per Week: 7 days    Minutes of Exercise per Session: 40 min  Stress: No Stress Concern Present (06/09/2021)   [employer] of Occupational Health - Occupational Stress Questionnaire    Feeling of Stress : Not at all  Social Connections: Moderately Integrated (06/09/2021)   Social Connection and Isolation Panel [NHANES]    Frequency of Communication with Friends and Family: More than three times a week    Frequency of Social Gatherings with Friends and Family: More than three times a week    Attends Religious Services: More than 4 times per year    Active Member of [employer] or Organizations: No    Attends Banker Meetings: Never    Marital Status: Married   Outpatient Encounter Medications as of 05/12/2023  Medication Sig  Accu-Chek FastClix Lancets MISC Use to monitor glucose twice daily   ALPRAZolam (XANAX) 1 MG tablet Take 1 tablet (1 mg total) by mouth 2 (two) times daily.   Blood Glucose Monitoring Suppl (ACCU-CHEK GUIDE ME) w/Device KIT 1 Device by Does not apply route 4 (four) times daily.   carvedilol (COREG) 3.125 MG tablet Take 1 tablet (3.125 mg total) by mouth 2 (two) times daily with a meal.   FLUoxetine (PROZAC) 40 MG capsule Take 1 capsule (40 mg total) by mouth daily.   glucose blood (ACCU-CHEK GUIDE) test strip Use as instructed   lisinopril-hydrochlorothiazide (ZESTORETIC) 20-25 MG tablet Take 1 tablet by mouth once daily   rosuvastatin (CRESTOR) 40 MG tablet Take 1 tablet (40 mg total) by mouth daily.    traZODone (DESYREL) 150 MG tablet TAKE 1 TABLET BY MOUTH AT BEDTIME   [DISCONTINUED] insulin glargine (LANTUS SOLOSTAR) 100 UNIT/ML Solostar Pen Inject 40 Units into the skin at bedtime.   [DISCONTINUED] Insulin Pen Needle (B-D ULTRAFINE III SHORT PEN) 31G X 8 MM MISC Use to inject insulin once daily   [DISCONTINUED] metFORMIN (GLUCOPHAGE) 500 MG tablet Take 1 tablet (500 mg total) by mouth 2 (two) times daily with a meal.   insulin glargine (LANTUS SOLOSTAR) 100 UNIT/ML Solostar Pen Inject 40 Units into the skin at bedtime.   Insulin Pen Needle (B-D ULTRAFINE III SHORT PEN) 31G X 8 MM MISC Use to inject insulin once daily   metFORMIN (GLUCOPHAGE) 500 MG tablet Take 1 tablet (500 mg total) by mouth 2 (two) times daily with a meal.   [DISCONTINUED] insulin glargine (LANTUS SOLOSTAR) 100 UNIT/ML Solostar Pen Inject 20 Units into the skin at bedtime.   No facility-administered encounter medications on file as of 05/12/2023.   ALLERGIES: Allergies  Allergen Reactions   Januvia [Sitagliptin] Nausea And Vomiting   Poison Oak Extract [Poison Oak Extract] Dermatitis   VACCINATION STATUS: Immunization History  Administered Date(s) Administered   Influenza Whole 03/18/2010, 04/13/2011   Influenza,inj,Quad PF,6+ Mos 05/20/2013, 05/08/2014, 06/11/2015, 04/18/2017, 05/23/2018, 03/18/2019, 02/12/2020, 05/07/2021, 04/07/2022   Janssen (J&J) SARS-COV-2 Vaccination 11/02/2019   PNEUMOCOCCAL CONJUGATE-20 05/07/2021   Pfizer Covid-19 Vaccine Bivalent Booster 68yrs & up 06/21/2021   Pneumococcal Polysaccharide-23 11/26/2013, 03/18/2019   Td 09/15/2009   Zoster Recombinant(Shingrix) 06/21/2021    Diabetes He presents for his follow-up diabetic visit. He has type 2 diabetes mellitus. Onset time: He was diagnosed at age 19. His disease course has been improving. There are no hypoglycemic associated symptoms. Pertinent negatives for hypoglycemia include no headaches or seizures. Associated symptoms include  fatigue. Pertinent negatives for diabetes include no blurred vision, no chest pain, no polydipsia, no polyuria and no visual change. There are no hypoglycemic complications. Symptoms are improving. Diabetic complications include nephropathy. Risk factors for coronary artery disease include dyslipidemia, diabetes mellitus, hypertension, male sex, obesity and sedentary lifestyle. Current diabetic treatment includes insulin injections and oral agent (monotherapy). He is compliant with treatment most of the time. His weight is fluctuating minimally. He is following a generally healthy (admits to drinking sweet tea at times) diet. When asked about meal planning, he reported none. He has not had a previous visit with a dietitian. He participates in exercise three times a week. His home blood glucose trend is increasing steadily. His overall blood glucose range is >200 mg/dl. (He presents today with his CGM showing above target glycemic profile overall.  His POCT A1c today is 8.4%, increasing from last visit of 6.6%.  Analysis of his CGM  shows TIR 32%, TAR 68%, TBR 0% with a GMI of 8.8%.  He notes he has been battling depression, is getting treatment for it.  He denies any hypoglycemia.  He did admit to starting drinking sodas once again.) An ACE inhibitor/angiotensin II receptor blocker is being taken. He does not see a podiatrist.Eye exam is current.  Hyperlipidemia This is a chronic problem. The current episode started more than 1 year ago. The problem is uncontrolled. Recent lipid tests were reviewed and are high. Exacerbating diseases include chronic renal disease, diabetes and obesity. Factors aggravating his hyperlipidemia include fatty foods. Pertinent negatives include no chest pain, myalgias or shortness of breath. Current antihyperlipidemic treatment includes statins. The current treatment provides mild improvement of lipids. Compliance problems include adherence to diet and adherence to exercise.  Risk  factors for coronary artery disease include diabetes mellitus, hypertension, male sex, obesity, a sedentary lifestyle and dyslipidemia.  Hypertension This is a chronic problem. The current episode started more than 1 year ago. The problem has been gradually improving since onset. The problem is controlled. Pertinent negatives include no blurred vision, chest pain, headaches, palpitations or shortness of breath. There are no associated agents to hypertension. Risk factors for coronary artery disease include dyslipidemia, diabetes mellitus, male gender, obesity and sedentary lifestyle. Past treatments include ACE inhibitors, diuretics and calcium channel blockers. The current treatment provides moderate improvement. Compliance problems include diet and exercise.  Hypertensive end-organ damage includes kidney disease. Identifiable causes of hypertension include chronic renal disease.    Review of systems  Constitutional: + increasing body weight,  current Body mass index is 29.41 kg/m. , no fatigue, no subjective hyperthermia, no subjective hypothermia Eyes: + blurry vision, no xerophthalmia ENT: no sore throat, no nodules palpated in throat, no dysphagia/odynophagia, no hoarseness Cardiovascular: no chest pain, no shortness of breath, no palpitations, no leg swelling Respiratory: no cough, no shortness of breath Gastrointestinal: no nausea/vomiting/diarrhea Musculoskeletal: no muscle/joint aches Skin: no rashes, no hyperemia Neurological: no tremors, no numbness, no tingling, no dizziness Psychiatric: + depression, no anxiety   Objective:    BP 117/73 (BP Location: Right Arm, Patient Position: Sitting, Cuff Size: Large)   Pulse 76   Ht 5\' 8"  (1.727 m)   Wt 193 lb 6.4 oz (87.7 kg)   BMI 29.41 kg/m   Wt Readings from Last 3 Encounters:  05/12/23 193 lb 6.4 oz (87.7 kg)  04/03/23 180 lb (81.6 kg)  01/17/23 187 lb 9.6 oz (85.1 kg)    BP Readings from Last 3 Encounters:  05/12/23 117/73   04/03/23 (!) 143/83  01/17/23 130/72     Physical Exam- Limited  Constitutional:  Body mass index is 29.41 kg/m. , not in acute distress, normal state of mind Eyes:  EOMI, no exophthalmos Musculoskeletal: no gross deformities, strength intact in all four extremities, no gross restriction of joint movements Skin:  no rashes, no hyperemia Neurological: no tremor with outstretched hands   CMP     Component Value Date/Time   NA 128 (L) 04/03/2023 1804   NA 137 10/25/2022 1650   K 4.0 04/03/2023 1804   CL 93 (L) 04/03/2023 1804   CO2 27 04/03/2023 1804   GLUCOSE 314 (H) 04/03/2023 1804   BUN 24 (H) 04/03/2023 1804   BUN 15 10/25/2022 1650   CREATININE 1.64 (H) 04/03/2023 1804   CREATININE 1.38 (H) 01/07/2020 0729   CALCIUM 9.3 04/03/2023 1804   PROT 7.8 04/03/2023 1804   PROT 7.5 08/09/2022 1128   ALBUMIN  4.0 04/03/2023 1804   ALBUMIN 4.3 08/09/2022 1128   AST 19 04/03/2023 1804   ALT 28 04/03/2023 1804   ALKPHOS 85 04/03/2023 1804   BILITOT 0.7 04/03/2023 1804   BILITOT 0.5 08/09/2022 1128   GFRNONAA 47 (L) 04/03/2023 1804   GFRNONAA 56 (L) 01/07/2020 0729   GFRAA 73 07/03/2020 1038   GFRAA 64 01/07/2020 0729    Lab Results  Component Value Date   HGBA1C 8.4 (A) 05/12/2023   HGBA1C 6.6 (A) 01/17/2023   HGBA1C 10.8 (H) 08/09/2022   MICROALBUR 13.8 03/18/2019   MICROALBUR 5.3 02/06/2018   MICROALBUR 134.8 (H) 12/30/2016    Lipid Panel     Component Value Date/Time   CHOL 255 (H) 08/09/2022 1128   TRIG 155 (H) 08/09/2022 1128   HDL 42 08/09/2022 1128   CHOLHDL 6.1 (H) 08/09/2022 1128   CHOLHDL 4.2 01/07/2020 0729   VLDL 64 (H) 12/30/2016 0950   LDLCALC 184 (H) 08/09/2022 1128   LDLCALC 99 01/07/2020 0729     Assessment & Plan:   1) Diabetes mellitus type 2 in obese [facility])  - Patient has currently uncontrolled symptomatic type 2 DM since  62 years of age.  He presents today with his CGM showing above target glycemic profile overall.  His POCT A1c  today is 8.4%, increasing from last visit of 6.6%.  Analysis of his CGM shows TIR 32%, TAR 68%, TBR 0% with a GMI of 8.8%.  He notes he has been battling depression, is getting treatment for it.  He denies any hypoglycemia.  He did admit to starting drinking sodas once again.   His diabetes is complicated by obesity , prior history of noncompliance/nonadherence, and patient remains at a high risk for more acute and chronic complications of diabetes which include CAD, CVA, CKD, retinopathy, and neuropathy. These are all discussed in detail with the patient.  - Nutritional counseling repeated at each appointment due to patients tendency to fall back in to old habits.  - The patient admits there is a room for improvement in their diet and drink choices. -  Suggestion is made for the patient to avoid simple carbohydrates from their diet including Cakes, Sweet Desserts / Pastries, Ice Cream, Soda (diet and regular), Sweet Tea, Candies, Chips, Cookies, Sweet Pastries, Store Bought Juices, Alcohol in Excess of 1-2 drinks a day, Artificial Sweeteners, Coffee Creamer, and "Sugar-free" Products. This will help patient to have stable blood glucose profile and potentially avoid unintended weight gain.   - I encouraged the patient to switch to unprocessed or minimally processed complex starch and increased protein intake (animal or plant source), fruits, and vegetables.   - Patient is advised to stick to a routine mealtimes to eat 3 meals a day and avoid unnecessary snacks (to snack only to correct hypoglycemia).  - I have approached patient with the following individualized plan to manage diabetes and patient agrees:   -Given his at target glycemic profile overall, no changes will be made to his medications today.  He is advised to continue Lantus 40 units SQ nightly and continue Metforimin 500 mg po twice daily with meals.   I did give him patient assistance application for [employer] as he is having trouble  affording his medications (will change to [place] and get needles from them if approved).  I did give sample [place]jeo to hold him over.  -He is strongly encouraged to monitor glucose at least twice daily (using his CGM), before breakfast and before bed,  and call the clinic if he has readings less than 70 or greater than 200 for 3 tests in a row.  He is aware that his insulin may need further adjustment to avoid hypoglycemia.  He is great candidate for CGM due to hypoglycemia and visual impairment, is advised to continue wearing.  - Patient will be considered for incretin therapy as appropriate next visit.  2) BP/HTN: His blood pressure is controlled to target.  He is advised to continue Lisinopril-HCT 20-25 mg po daily.   3) Lipids/HPL: His most recent lipid panel from 08/09/22 shows uncontrolled LDL at 184.  He is advised to continue Crestor 40 mg po daily at bedtime.  Side effects and precautions discussed with him.  4) Chronic Care/Health Maintenance: -Patient is on ACEI/ARB and Statin medications and encouraged to continue to follow up with Ophthalmology, Podiatrist at least yearly or according to recommendations, and advised to stay away from smoking. I have recommended yearly flu vaccine and pneumonia vaccination at least every 5 years; moderate intensity exercise for up to 150 minutes weekly; and  sleep for at least 7 hours a day.  - I advised patient to maintain close follow up with Kerri Perches, MD for primary care needs.      I spent  28  minutes in the care of the patient today including review of labs from CMP, Lipids, Thyroid Function, Hematology (current and previous including abstractions from other facilities); face-to-face time discussing  his blood glucose readings/logs, discussing hypoglycemia and hyperglycemia episodes and symptoms, medications doses, his options of short and long term treatment based on the latest standards of care / guidelines;  discussion about  incorporating lifestyle medicine;  and documenting the encounter. Risk reduction counseling performed per USPSTF guidelines to reduce obesity and cardiovascular risk factors.     Please refer to Patient Instructions for Blood Glucose Monitoring and Insulin/Medications Dosing Guide"  in media tab for additional information. Please  also refer to " Patient Self Inventory" in the Media  tab for reviewed elements of pertinent patient history.  [name] participated in the discussions, expressed understanding, and voiced agreement with the above plans.  All questions were answered to his satisfaction. he is encouraged to contact clinic should he have any questions or concerns prior to his return visit.    Follow up plan: - Return in about 4 months (around 09/09/2023) for Diabetes F/U with A1c in office, No previsit labs, Bring meter and logs.   Ronny Bacon, [facility] [address] [place], [place] [address] Phone: [phone] Fax: [phone]  05/12/2023, 11:51 AM

## 2023-05-17 ENCOUNTER — Other Ambulatory Visit (HOSPITAL_COMMUNITY): Payer: Self-pay | Admitting: Psychiatry

## 2023-05-17 DIAGNOSIS — F322 Major depressive disorder, single episode, severe without psychotic features: Secondary | ICD-10-CM

## 2023-05-22 ENCOUNTER — Ambulatory Visit: Payer: Medicare Other | Admitting: Nurse Practitioner

## 2023-05-25 ENCOUNTER — Telehealth (HOSPITAL_COMMUNITY): Payer: Medicare Other | Admitting: Psychiatry

## 2023-05-25 ENCOUNTER — Encounter (HOSPITAL_COMMUNITY): Payer: Self-pay | Admitting: Psychiatry

## 2023-05-25 DIAGNOSIS — F322 Major depressive disorder, single episode, severe without psychotic features: Secondary | ICD-10-CM

## 2023-05-25 MED ORDER — FLUOXETINE HCL 40 MG PO CAPS: 40.0000 mg | ORAL_CAPSULE | Freq: Every day | ORAL | 2 refills | Status: DC

## 2023-05-25 MED ORDER — TRAZODONE HCL 150 MG PO TABS: 150.0000 mg | ORAL_TABLET | Freq: Every day | ORAL | 2 refills | Status: DC

## 2023-05-25 MED ORDER — ALPRAZOLAM 1 MG PO TABS
1.0000 mg | ORAL_TABLET | Freq: Two times a day (BID) | ORAL | 2 refills | Status: DC
Start: 2023-05-25 — End: 2023-08-24

## 2023-05-25 NOTE — Progress Notes (Signed)
Virtual Visit via Telephone Note  I connected with John [name] on 05/25/23 at  9:20 AM EST by telephone and verified that I am speaking with the correct person using two identifiers.  Location: Patient: home Provider: office   I discussed the limitations, risks, security and privacy concerns of performing an evaluation and management service by telephone and the availability of in person appointments. I also discussed with the patient that there may be a patient responsible charge related to this service. The patient expressed understanding and agreed to proceed.     I discussed the assessment and treatment plan with the patient. The patient was provided an opportunity to ask questions and all were answered. The patient agreed with the plan and demonstrated an understanding of the instructions.   The patient was advised to call back or seek an in-person evaluation if the symptoms worsen or if the condition fails to improve as anticipated.  I provided 15 minutes of non-face-to-face time during this encounter.   Diannia Ruder, MD  [facility] MD/PA/NP OP Progress Note  05/25/2023 9:31 AM John [name]  MRN:  [id number]  Chief Complaint:  Chief Complaint  Patient presents with   Depression   Anxiety   Follow-up   HPI: This patient is a 62 year old black male who is living with his wife in [place]. He has 1 son who died at age 60 in 79. He is on disability.   The patient returns for follow-up after 3 months regarding his depression and anxiety.  He is now on a higher dose of Prozac-40 mg every morning.  He seems to be feeling better.  He states that he and his wife are getting along well.  He is getting out more with people and has returned to going to the gym.  His energy seems to be improving.  His sleep is variable but he goes to bed really early like 7 or 8 PM.  I explained that this is probably too early for an adult and that is why he is waking up in the early morning.  He will  try going to bed around 9 or 10.  The Xanax continues to help his anxiety and trazodone sometimes helps him to sleep.  He states that sometimes he takes more Xanax than 2 a day and he runs out early and I warned him to stick to 2 a day.  He denies any thoughts of suicide or self-harm Visit Diagnosis:    ICD-10-CM   1. Major depressive disorder, single episode, severe without psychotic features (HCC)  F32.2 ALPRAZolam (XANAX) 1 MG tablet      Past Psychiatric History: none  Past Medical History:  Past Medical History:  Diagnosis Date   Anxiety    Depression 2007   Diabetes mellitus without complication (HCC)    Helicobacter pylori ab+ 01/21/2019   Dx in 12/2018, will treat   Hyperlipidemia    Hypertension    Hypogonadism male    Obesity (BMI 30.0-34.9) 03/01/2017    Past Surgical History:  Procedure Laterality Date   CIRCUMCISION N/A 06/10/2019   Procedure: CIRCUMCISION ADULT;  Surgeon: Malen Gauze, MD;  Location: [facility];  Service: Urology;  Laterality: N/A;  30 MINS   COLONOSCOPY  01/20/2011   Dr. Darrick Penna: large internal hemorrhoids   COLONOSCOPY WITH PROPOFOL N/A 09/21/2020   Procedure: COLONOSCOPY WITH PROPOFOL;  Surgeon: Lanelle Bal, DO;  Location: [facility];  Service: Endoscopy;  Laterality: N/A;  AM-diabetic   HERNIA  REPAIR     ventral hernia repair    VASECTOMY      Family Psychiatric History: See below  Family History:  Family History  Problem Relation Age of Onset   Heart failure Mother        living    Hypertension Mother    Hyperlipidemia Mother    Heart disease Mother    Hypertension Sister    Hypertension Brother    Colon cancer Neg Hx     Social History:  Social History   Socioeconomic History   Marital status: Married    Spouse name: Not on file   Number of children: 1   Years of education: Not on file   Highest education level: Not on file  Occupational History   Occupation: Dispensing optician  Tobacco Use   Smoking status: Never    Smokeless tobacco: Never  Vaping Use   Vaping status: Never Used  Substance and Sexual Activity   Alcohol use: Yes    Alcohol/week: 1.0 standard drink of alcohol    Types: 1 Cans of beer per week    Comment: occasional beer socially.    Drug use: No   Sexual activity: Yes  Other Topics Concern   Not on file  Social History Narrative   Not on file   Social Determinants of Health   Financial Resource Strain: Low Risk  (06/09/2021)   Overall Financial Resource Strain (CARDIA)    Difficulty of Paying Living Expenses: Not hard at all  Food Insecurity: No Food Insecurity (06/09/2021)   Hunger Vital Sign    Worried About Running Out of Food in the Last Year: Never true    Ran Out of Food in the Last Year: Never true  Transportation Needs: No Transportation Needs (06/09/2021)   PRAPARE - Administrator, Civil Service (Medical): No    Lack of Transportation (Non-Medical): No  Physical Activity: Sufficiently Active (06/09/2021)   Exercise Vital Sign    Days of Exercise per Week: 7 days    Minutes of Exercise per Session: 40 min  Stress: No Stress Concern Present (06/09/2021)   [employer] of Occupational Health - Occupational Stress Questionnaire    Feeling of Stress : Not at all  Social Connections: Moderately Integrated (06/09/2021)   Social Connection and Isolation Panel [NHANES]    Frequency of Communication with Friends and Family: More than three times a week    Frequency of Social Gatherings with Friends and Family: More than three times a week    Attends Religious Services: More than 4 times per year    Active Member of [employer] or Organizations: No    Attends Banker Meetings: Never    Marital Status: Married    Allergies:  Allergies  Allergen Reactions   Januvia [Sitagliptin] Nausea And Vomiting   Poison Oak Extract [Poison Oak Extract] Dermatitis    Metabolic Disorder Labs: Lab Results  Component Value Date   HGBA1C 8.4 (A) 05/12/2023    MPG 114.02 06/10/2019   MPG 140 02/26/2019   No results found for: "PROLACTIN" Lab Results  Component Value Date   CHOL 255 (H) 08/09/2022   TRIG 155 (H) 08/09/2022   HDL 42 08/09/2022   CHOLHDL 6.1 (H) 08/09/2022   VLDL 64 (H) 12/30/2016   LDLCALC 184 (H) 08/09/2022   LDLCALC 146 (H) 04/07/2022   Lab Results  Component Value Date   TSH 1.830 11/04/2021   TSH 1.850 07/03/2020    Therapeutic Level  Labs: No results found for: "LITHIUM" No results found for: "VALPROATE" No results found for: "CBMZ"  Current Medications: Current Outpatient Medications  Medication Sig Dispense Refill   Accu-Chek FastClix Lancets MISC Use to monitor glucose twice daily 102 each 6   ALPRAZolam (XANAX) 1 MG tablet Take 1 tablet (1 mg total) by mouth 2 (two) times daily. 60 tablet 2   Blood Glucose Monitoring Suppl (ACCU-CHEK GUIDE ME) w/Device KIT 1 Device by Does not apply route 4 (four) times daily. 1 kit 0   carvedilol (COREG) 3.125 MG tablet Take 1 tablet (3.125 mg total) by mouth 2 (two) times daily with a meal. 60 tablet 3   FLUoxetine (PROZAC) 40 MG capsule Take 1 capsule (40 mg total) by mouth daily. 30 capsule 2   glucose blood (ACCU-CHEK GUIDE) test strip Use as instructed 100 each 12   insulin glargine (LANTUS SOLOSTAR) 100 UNIT/ML Solostar Pen Inject 40 Units into the skin at bedtime. 36 mL 0   Insulin Pen Needle (B-D ULTRAFINE III SHORT PEN) 31G X 8 MM MISC Use to inject insulin once daily 100 each 3   lisinopril-hydrochlorothiazide (ZESTORETIC) 20-25 MG tablet Take 1 tablet by mouth once daily 90 tablet 0   metFORMIN (GLUCOPHAGE) 500 MG tablet Take 1 tablet (500 mg total) by mouth 2 (two) times daily with a meal. 180 tablet 3   rosuvastatin (CRESTOR) 40 MG tablet Take 1 tablet (40 mg total) by mouth daily. 90 tablet 1   traZODone (DESYREL) 150 MG tablet Take 1 tablet (150 mg total) by mouth at bedtime. 30 tablet 2   No current facility-administered medications for this visit.      Musculoskeletal: Strength & Muscle Tone: na Gait & Station: na Patient leans: N/A  Psychiatric Specialty Exam: Review of Systems  All other systems reviewed and are negative.   There were no vitals taken for this visit.There is no height or weight on file to calculate BMI.  General Appearance: NA  Eye Contact:  NA  Speech:  Clear and Coherent  Volume:  Normal  Mood:  Euthymic  Affect:  NA  Thought Process:  Goal Directed  Orientation:  Full (Time, Place, and Person)  Thought Content: WDL   Suicidal Thoughts:  No  Homicidal Thoughts:  No  Memory:  Immediate;   Good Recent;   Good Remote;   NA  Judgement:  Fair  Insight:  Shallow  Psychomotor Activity:  Normal  Concentration:  Concentration: Fair and Attention Span: Fair  Recall:  [employer] of Knowledge: Fair  Language: Good  Akathisia:  No  Handed:  Right  AIMS (if indicated): not done  Assets:  Communication Skills Desire for Improvement Resilience Social Support  ADL's:  Intact  Cognition: Impaired,  Mild  Sleep:  Good   Screenings: GAD-7    Flowsheet Row Office Visit from 12/28/2022 in [facility] Office Visit from 10/25/2022 in [facility] Office Visit from 08/09/2022 in [facility]  Total GAD-7 Score 17 0 0      PHQ2-9    Flowsheet Row Video Visit from 01/11/2023 in [facility] at [place] Office Visit from 12/28/2022 in [facility] Office Visit from 10/25/2022 in [facility] Office Visit from 10/04/2022 in [facility] Office Visit from 09/27/2022 in [facility] Primary Care  PHQ-2 Total Score 4 6 0 6 1  PHQ-9 Total Score  15 21 0 -- 3      Flowsheet Row ED from 04/03/2023 in [facility] Emergency Department at [facility] ED from 10/25/2022 in [facility] Department at [facility] Video Visit from  03/15/2022 in [facility] at [place]  C-SSRS RISK CATEGORY No Risk No Risk No Risk        Assessment and Plan: This patient is a 62 year old male with a history of depression and anxiety and mild cognitive impairment.  He is doing better on the increased Prozac to 40 mg daily for depression.  He will continue this as well as Xanax 1 mg twice daily as needed for anxiety and trazodone 150 mg at bedtime for sleep.  He will return to see me in 3 months  Collaboration of Care: Collaboration of Care: Primary Care Provider AEB notes are shared on the epic system with PCP  Patient/Guardian was advised Release of Information must be obtained prior to any record release in order to collaborate their care with an outside provider. Patient/Guardian was advised if they have not already done so to contact the registration department to sign all necessary forms in order for [place] to release information regarding their care.   Consent: Patient/Guardian gives verbal consent for treatment and assignment of benefits for services provided during this visit. Patient/Guardian expressed understanding and agreed to proceed.    Diannia Ruder, MD 05/25/2023, 9:31 AM

## 2023-06-09 ENCOUNTER — Encounter: Payer: Medicare Other | Admitting: Family Medicine

## 2023-06-30 ENCOUNTER — Other Ambulatory Visit: Payer: Self-pay | Admitting: Family Medicine

## 2023-07-12 ENCOUNTER — Other Ambulatory Visit: Payer: Self-pay | Admitting: Family Medicine

## 2023-07-26 ENCOUNTER — Encounter: Payer: Medicare Other | Admitting: Family Medicine

## 2023-08-03 DIAGNOSIS — E1165 Type 2 diabetes mellitus with hyperglycemia: Secondary | ICD-10-CM | POA: Diagnosis not present

## 2023-08-03 DIAGNOSIS — E08649 Diabetes mellitus due to underlying condition with hypoglycemia without coma: Secondary | ICD-10-CM | POA: Diagnosis not present

## 2023-08-08 ENCOUNTER — Encounter: Payer: Self-pay | Admitting: Family Medicine

## 2023-08-23 ENCOUNTER — Telehealth (HOSPITAL_COMMUNITY): Payer: Medicare Other | Admitting: Psychiatry

## 2023-08-24 ENCOUNTER — Telehealth (HOSPITAL_COMMUNITY): Payer: Medicare Other | Admitting: Psychiatry

## 2023-08-24 ENCOUNTER — Encounter (HOSPITAL_COMMUNITY): Payer: Self-pay | Admitting: Psychiatry

## 2023-08-24 DIAGNOSIS — F322 Major depressive disorder, single episode, severe without psychotic features: Secondary | ICD-10-CM | POA: Diagnosis not present

## 2023-08-24 MED ORDER — ARIPIPRAZOLE 2 MG PO TABS: 2.0000 mg | ORAL_TABLET | Freq: Every day | ORAL | 2 refills | Status: DC

## 2023-08-24 MED ORDER — TRAZODONE HCL 150 MG PO TABS: 150.0000 mg | ORAL_TABLET | Freq: Every day | ORAL | 2 refills | Status: DC

## 2023-08-24 MED ORDER — ALPRAZOLAM 1 MG PO TABS
1.0000 mg | ORAL_TABLET | Freq: Two times a day (BID) | ORAL | 2 refills | Status: DC
Start: 2023-08-24 — End: 2023-10-05

## 2023-08-24 MED ORDER — FLUOXETINE HCL 40 MG PO CAPS: 40.0000 mg | ORAL_CAPSULE | Freq: Every day | ORAL | 2 refills | Status: DC

## 2023-08-24 NOTE — Progress Notes (Signed)
 Virtual Visit via Telephone Note  I connected with John [name] on 08/24/23 at  9:00 AM EST by telephone and verified that I am speaking with the correct person using two identifiers.  Location: Patient: home Provider: office   I discussed the limitations, risks, security and privacy concerns of performing an evaluation and management service by telephone and the availability of in person appointments. I also discussed with the patient that there may be a patient responsible charge related to this service. The patient expressed understanding and agreed to proceed.       I discussed the assessment and treatment plan with the patient. The patient was provided an opportunity to ask questions and all were answered. The patient agreed with the plan and demonstrated an understanding of the instructions.   The patient was advised to call back or seek an in-person evaluation if the symptoms worsen or if the condition fails to improve as anticipated.  I provided 20 minutes of non-face-to-face time during this encounter.   Diannia Ruder, MD  [facility] MD/PA/NP OP Progress Note  08/24/2023 9:07 AM John [name]  MRN:  [id number]  Chief Complaint:  Chief Complaint  Patient presents with   Depression   Anxiety   Follow-up   HPI: : This patient is a 63 year old black male who is living with his wife in [place]. He has 1 son who died at age 87 in 9. He is on disability.   The patient returns for follow-up after 3 months regarding his depression and anxiety.  Last time he told me he was feeling somewhat better and he was getting out and going to the gym etc.  However today he states that he is still somewhat depressed.  His energy is flag.  He does not feel like doing as much.  He denies being suicidal or having any thoughts of self-harm.  He denies significant anxiety and he is sleeping fairly well.  He is eating well.  I suggested we add Abilify for augmentation of his Prozac and he is in  agreement the Visit Diagnosis:    ICD-10-CM   1. Major depressive disorder, single episode, severe without psychotic features (HCC)  F32.2 ALPRAZolam (XANAX) 1 MG tablet      Past Psychiatric History: none  Past Medical History:  Past Medical History:  Diagnosis Date   Anxiety    Depression 2007   Diabetes mellitus without complication (HCC)    Helicobacter pylori ab+ 01/21/2019   Dx in 12/2018, will treat   Hyperlipidemia    Hypertension    Hypogonadism male    Obesity (BMI 30.0-34.9) 03/01/2017    Past Surgical History:  Procedure Laterality Date   CIRCUMCISION N/A 06/10/2019   Procedure: CIRCUMCISION ADULT;  Surgeon: Malen Gauze, MD;  Location: [facility];  Service: Urology;  Laterality: N/A;  30 MINS   COLONOSCOPY  01/20/2011   Dr. Darrick Penna: large internal hemorrhoids   COLONOSCOPY WITH PROPOFOL N/A 09/21/2020   Procedure: COLONOSCOPY WITH PROPOFOL;  Surgeon: Lanelle Bal, DO;  [facility];  Service: Endoscopy;  Laterality: N/A;  AM-diabetic   HERNIA REPAIR     ventral hernia repair    VASECTOMY      Family Psychiatric History: See below  Family History:  Family History  Problem Relation Age of Onset   Heart failure Mother        living    Hypertension Mother    Hyperlipidemia Mother    Heart disease Mother  Hypertension Sister    Hypertension Brother    Colon cancer Neg Hx     Social History:  Social History   Socioeconomic History   Marital status: Married    Spouse name: Not on file   Number of children: 1   Years of education: Not on file   Highest education level: Not on file  Occupational History   Occupation: Dispensing optician  Tobacco Use   Smoking status: Never   Smokeless tobacco: Never  Vaping Use   Vaping status: Never Used  Substance and Sexual Activity   Alcohol use: Yes    Alcohol/week: 1.0 standard drink of alcohol    Types: 1 Cans of beer per week    Comment: occasional beer socially.    Drug use: No   Sexual  activity: Yes  Other Topics Concern   Not on file  Social History Narrative   Not on file   Social Drivers of Health   Financial Resource Strain: Low Risk  (06/09/2021)   Overall Financial Resource Strain (CARDIA)    Difficulty of Paying Living Expenses: Not hard at all  Food Insecurity: No Food Insecurity (06/09/2021)   Hunger Vital Sign    Worried About Running Out of Food in the Last Year: Never true    Ran Out of Food in the Last Year: Never true  Transportation Needs: No Transportation Needs (06/09/2021)   PRAPARE - Administrator, Civil Service (Medical): No    Lack of Transportation (Non-Medical): No  Physical Activity: Sufficiently Active (06/09/2021)   Exercise Vital Sign    Days of Exercise per Week: 7 days    Minutes of Exercise per Session: 40 min  Stress: No Stress Concern Present (06/09/2021)   [employer] of Occupational Health - Occupational Stress Questionnaire    Feeling of Stress : Not at all  Social Connections: Moderately Integrated (06/09/2021)   Social Connection and Isolation Panel [NHANES]    Frequency of Communication with Friends and Family: More than three times a week    Frequency of Social Gatherings with Friends and Family: More than three times a week    Attends Religious Services: More than 4 times per year    Active Member of [employer] or Organizations: No    Attends Banker Meetings: Never    Marital Status: Married    Allergies:  Allergies  Allergen Reactions   Januvia [Sitagliptin] Nausea And Vomiting   Poison Oak Extract [Poison Oak Extract] Dermatitis    Metabolic Disorder Labs: Lab Results  Component Value Date   HGBA1C 8.4 (A) 05/12/2023   MPG 114.02 06/10/2019   MPG 140 02/26/2019   No results found for: "PROLACTIN" Lab Results  Component Value Date   CHOL 255 (H) 08/09/2022   TRIG 155 (H) 08/09/2022   HDL 42 08/09/2022   CHOLHDL 6.1 (H) 08/09/2022   VLDL 64 (H) 12/30/2016   LDLCALC 184 (H)  08/09/2022   LDLCALC 146 (H) 04/07/2022   Lab Results  Component Value Date   TSH 1.830 11/04/2021   TSH 1.850 07/03/2020    Therapeutic Level Labs: No results found for: "LITHIUM" No results found for: "VALPROATE" No results found for: "CBMZ"  Current Medications: Current Outpatient Medications  Medication Sig Dispense Refill   ARIPiprazole (ABILIFY) 2 MG tablet Take 1 tablet (2 mg total) by mouth daily. 30 tablet 2   Accu-Chek FastClix Lancets MISC Use to monitor glucose twice daily 102 each 6   ALPRAZolam (XANAX) 1  MG tablet Take 1 tablet (1 mg total) by mouth 2 (two) times daily. 60 tablet 2   Blood Glucose Monitoring Suppl (ACCU-CHEK GUIDE ME) w/Device KIT 1 Device by Does not apply route 4 (four) times daily. 1 kit 0   carvedilol (COREG) 3.125 MG tablet TAKE 1 TABLET BY MOUTH TWICE DAILY WITH A MEAL 60 tablet 0   FLUoxetine (PROZAC) 40 MG capsule Take 1 capsule (40 mg total) by mouth daily. 30 capsule 2   glucose blood (ACCU-CHEK GUIDE) test strip Use as instructed 100 each 12   insulin glargine (LANTUS SOLOSTAR) 100 UNIT/ML Solostar Pen Inject 40 Units into the skin at bedtime. 36 mL 0   Insulin Pen Needle (B-D ULTRAFINE III SHORT PEN) 31G X 8 MM MISC Use to inject insulin once daily 100 each 3   lisinopril-hydrochlorothiazide (ZESTORETIC) 20-25 MG tablet Take 1 tablet by mouth once daily 90 tablet 0   metFORMIN (GLUCOPHAGE) 500 MG tablet Take 1 tablet (500 mg total) by mouth 2 (two) times daily with a meal. 180 tablet 3   rosuvastatin (CRESTOR) 40 MG tablet Take 1 tablet by mouth once daily 90 tablet 0   traZODone (DESYREL) 150 MG tablet Take 1 tablet (150 mg total) by mouth at bedtime. 30 tablet 2   No current facility-administered medications for this visit.     Musculoskeletal: Strength & Muscle Tone: na Gait & Station: na Patient leans: N/A  Psychiatric Specialty Exam: Review of Systems  Psychiatric/Behavioral:  Positive for dysphoric mood.   All other systems  reviewed and are negative.   There were no vitals taken for this visit.There is no height or weight on file to calculate BMI.  General Appearance: na  Eye Contact:  na  Speech:  Clear and Coherent  Volume:  Decreased  Mood:  Dysphoric  Affect:  NA  Thought Process:  Goal Directed  Orientation:  Full (Time, Place, and Person)  Thought Content: Rumination   Suicidal Thoughts:  No  Homicidal Thoughts:  No  Memory:  Immediate;   Good Recent;   Fair Remote;   NA  Judgement:  Fair  Insight:  Fair  Psychomotor Activity:  Decreased  Concentration:  Concentration: Good and Attention Span: Good  Recall:  Good  Fund of Knowledge: Fair  Language: Good  Akathisia:  No  Handed:  Right  AIMS (if indicated): not done  Assets:  Communication Skills Desire for Improvement Resilience Social Support  ADL's:  Intact  Cognition: Impaired,  Mild  Sleep:  Good   Screenings: GAD-7    Flowsheet Row Office Visit from 12/28/2022 in [facility] Office Visit from 10/25/2022 in [facility] Office Visit from 08/09/2022 in [facility] Care  Total GAD-7 Score 17 0 0      PHQ2-9    Flowsheet Row Video Visit from 01/11/2023 in [facility] at [place] Office Visit from 12/28/2022 in [facility] Office Visit from 10/25/2022 in [facility] Office Visit from 10/04/2022 in [facility] Care Office Visit from 09/27/2022 in [facility] [place] [facility]  PHQ-2 Total Score 4 6 0 6 1  PHQ-9 Total Score 15 21 0 -- 3      Flowsheet Row ED from 04/03/2023 in [facility] Emergency Department at [facility] ED from 10/25/2022 in [facility] Emergency Department at [facility] Video Visit from 03/15/2022 in [facility] Outpatient Behavioral Health  at [facility] RISK CATEGORY No Risk No Risk No Risk        Assessment and Plan: This  patient is a 63 year old male with a history of depression anxiety and mild cognitive impairment.  He is not doing as well on the Prozac 40 mg for depression so we will add Abilify 2 mg daily for augmentation.  He will continue Xanax 1 mg twice daily as needed for anxiety and trazodone 150 mg at bedtime for sleep.  He will return to see me in 6 weeks  Collaboration of Care: Collaboration of Care: Primary Care Provider AEB notes are shared with PCP on the epic system  Patient/Guardian was advised Release of Information must be obtained prior to any record release in order to collaborate their care with an outside provider. Patient/Guardian was advised if they have not already done so to contact the registration department to sign all necessary forms in order for [place] to release information regarding their care.   Consent: Patient/Guardian gives verbal consent for treatment and assignment of benefits for services provided during this visit. Patient/Guardian expressed understanding and agreed to proceed.    Diannia Ruder, MD 08/24/2023, 9:07 AM

## 2023-09-06 ENCOUNTER — Telehealth: Payer: Self-pay

## 2023-09-06 NOTE — Telephone Encounter (Signed)
 Patient was identified as falling into the [place] Measure - Diabetes.   Patient was: Appointment scheduled with primary care provider in the next 30 days.

## 2023-09-12 ENCOUNTER — Ambulatory Visit: Payer: Medicare Other | Admitting: Nurse Practitioner

## 2023-09-12 DIAGNOSIS — Z7984 Long term (current) use of oral hypoglycemic drugs: Secondary | ICD-10-CM

## 2023-09-12 DIAGNOSIS — E782 Mixed hyperlipidemia: Secondary | ICD-10-CM

## 2023-09-12 DIAGNOSIS — Z794 Long term (current) use of insulin: Secondary | ICD-10-CM

## 2023-09-20 ENCOUNTER — Ambulatory Visit (INDEPENDENT_AMBULATORY_CARE_PROVIDER_SITE_OTHER): Payer: Medicare Other | Admitting: Family Medicine

## 2023-09-20 ENCOUNTER — Encounter: Payer: Self-pay | Admitting: Family Medicine

## 2023-09-20 VITALS — BP 168/90 | HR 82 | Resp 16 | Ht 68.0 in | Wt 194.6 lb

## 2023-09-20 DIAGNOSIS — F322 Major depressive disorder, single episode, severe without psychotic features: Secondary | ICD-10-CM

## 2023-09-20 DIAGNOSIS — Z794 Long term (current) use of insulin: Secondary | ICD-10-CM | POA: Diagnosis not present

## 2023-09-20 DIAGNOSIS — E1165 Type 2 diabetes mellitus with hyperglycemia: Secondary | ICD-10-CM | POA: Diagnosis not present

## 2023-09-20 DIAGNOSIS — E782 Mixed hyperlipidemia: Secondary | ICD-10-CM

## 2023-09-20 DIAGNOSIS — Z0001 Encounter for general adult medical examination with abnormal findings: Secondary | ICD-10-CM | POA: Diagnosis not present

## 2023-09-20 DIAGNOSIS — Z0000 Encounter for general adult medical examination without abnormal findings: Secondary | ICD-10-CM | POA: Diagnosis not present

## 2023-09-20 DIAGNOSIS — I10 Essential (primary) hypertension: Secondary | ICD-10-CM

## 2023-09-20 DIAGNOSIS — M79673 Pain in unspecified foot: Secondary | ICD-10-CM | POA: Diagnosis not present

## 2023-09-20 DIAGNOSIS — Z125 Encounter for screening for malignant neoplasm of prostate: Secondary | ICD-10-CM

## 2023-09-20 DIAGNOSIS — E663 Overweight: Secondary | ICD-10-CM

## 2023-09-20 MED ORDER — CARVEDILOL 3.125 MG PO TABS: 3.1250 mg | ORAL_TABLET | Freq: Two times a day (BID) | ORAL | 3 refills | Status: DC

## 2023-09-20 MED ORDER — LISINOPRIL-HYDROCHLOROTHIAZIDE 20-25 MG PO TABS: 1.0000 | ORAL_TABLET | Freq: Every day | ORAL | 0 refills | Status: DC

## 2023-09-20 NOTE — Assessment & Plan Note (Signed)
  Patient re-educated about  the importance of commitment to a  minimum of 150 minutes of exercise per week as able.  The importance of healthy food choices with portion control discussed, as well as eating regularly and within a 12 hour window most days. The need to choose "clean , green" food 50 to 75% of the time is discussed, as well as to make water the primary drink and set a goal of 64 ounces water daily.       09/20/2023    1:13 PM 05/12/2023   11:34 AM 04/03/2023    5:40 PM  Weight /BMI  Weight 194 lb 9.6 oz 193 lb 6.4 oz 180 lb  Height 5\' 8"  (1.727 m) 5\' 8"  (1.727 m) 5\' 8"  (1.727 m)  BMI 29.59 kg/m2 29.41 kg/m2 27.37 kg/m2    Unchanged

## 2023-09-20 NOTE — Progress Notes (Signed)
 Amb pod   John [name]     MRN: [id number]      DOB: January 02, 1961  Chief Complaint  Patient presents with   Annual Exam    Annual Exam    HPI: Patient is in for annual physical exam. No other health concerns are expressed or addressed at the visit. Recent labs, if available are reviewed. Immunization is reviewed , and  updated if needed.    PE; BP (!) 168/90   Pulse 82   Resp 16   Ht 5\' 8"  (1.727 m)   Wt 194 lb 9.6 oz (88.3 kg)   SpO2 96%   BMI 29.59 kg/m   Pleasant male, alert and oriented x 3, in no cardio-pulmonary distress. Afebrile. HEENT No facial trauma or asymetry. Sinuses non tender. EOMI External ears normal,  Neck: supple, no adenopathy,JVD or thyromegaly.No bruits.  Chest: Clear to ascultation bilaterally.No crackles or wheezes. Non tender to palpation  Cardiovascular system; Heart sounds normal,  S1 and  S2 ,no S3.  No murmur, or thrill. Apical beat not displaced Peripheral pulses normal.  Abdomen: Soft, non tender, no organomegaly or masses. No bruits. Bowel sounds normal. No guarding, tenderness or rebound.    Musculoskeletal exam: Full ROM of spine, hips , shoulders and knees. No deformity ,swelling or crepitus noted. No muscle wasting or atrophy.   Neurologic: Cranial nerves 2 to 12 intact. Power, tone ,sensation and reflexes normal throughout. No disturbance in gait. No tremor.  Skin: Intact, no ulceration, erythema , scaling or rash noted. Pigmentation normal throughout  Psych; Normal mood and affect. Judgement and concentration normal   Assessment & Plan:  Encounter for Medicare annual examination with abnormal findings Annual exam as documented. Counseling done  re healthy lifestyle involving commitment to 150 minutes exercise per week, heart healthy diet, and attaining healthy weight.The importance of adequate sleep also discussed. Regular seat belt use and home safety, is also discussed. Changes in health habits are  decided on by the patient with goals and time frames  set for achieving them. Immunization and cancer screening needs are specifically addressed at this visit.   Essential hypertension Uncontrolled not taking med as prescribed DASH diet and commitment to daily physical activity for a minimum of 30 minutes discussed and encouraged, as a part of hypertension management. The importance of attaining a healthy weight is also discussed.     09/20/2023    1:38 PM 09/20/2023    1:13 PM 05/12/2023   11:34 AM 04/03/2023    5:44 PM 04/03/2023    5:40 PM 01/17/2023    8:16 AM 12/28/2022    9:48 AM  BP/Weight  Systolic BP 168 152 117 143  130 130  Diastolic BP 90 89 73 83  72 81  Wt. (Lbs)  194.6 193.4  180 187.6 185.12  BMI  29.59 kg/m2 29.41 kg/m2  27.37 kg/m2 28.52 kg/m2 28.15 kg/m2       Mixed hyperlipidemia Hyperlipidemia:Low fat diet discussed and encouraged.   Lipid Panel  Lab Results  Component Value Date   CHOL 255 (H) 08/09/2022   HDL 42 08/09/2022   LDLCALC 184 (H) 08/09/2022   TRIG 155 (H) 08/09/2022   CHOLHDL 6.1 (H) 08/09/2022     Updated lab needed at/ before next visit.   Severe major depression without psychotic features (HCC) Imprived on current mregime and managed by psych  Overweight (BMI 25.0-29.9)  Patient re-educated about  the importance of commitment to a  minimum of 150 minutes of  exercise per week as able.  The importance of healthy food choices with portion control discussed, as well as eating regularly and within a 12 hour window most days. The need to choose "clean , green" food 50 to 75% of the time is discussed, as well as to make water the primary drink and set a goal of 64 ounces water daily.       09/20/2023    1:13 PM 05/12/2023   11:34 AM 04/03/2023    5:40 PM  Weight /BMI  Weight 194 lb 9.6 oz 193 lb 6.4 oz 180 lb  Height 5\' 8"  (1.727 m) 5\' 8"  (1.727 m) 5\' 8"  (1.727 m)  BMI 29.59 kg/m2 29.41 kg/m2 27.37 kg/m2    Unchanged

## 2023-09-20 NOTE — Assessment & Plan Note (Signed)
 Hyperlipidemia:Low fat diet discussed and encouraged.   Lipid Panel  Lab Results  Component Value Date   CHOL 255 (H) 08/09/2022   HDL 42 08/09/2022   LDLCALC 184 (H) 08/09/2022   TRIG 155 (H) 08/09/2022   CHOLHDL 6.1 (H) 08/09/2022     Updated lab needed at/ before next visit.

## 2023-09-20 NOTE — Patient Instructions (Addendum)
 F/U in 8 weeks, call if you need me sooner, pls bring all meds to visit  NEED to take BOTH BLOOD Pressure pills, carvedilol and lisinopril/hydrochlorothiazide every day as directed, blood pressure is high  You are referred to Podiatry re foot pain  Please schedule diabetic retinal screening at checkout  Nurse pls verify immunizations needed and let pt know  Nurse pls order to be done today HBA1C, lipid, cmp and eGFR and PSA  Thanks for choosing [facility], we consider it a privelige to serve you.

## 2023-09-20 NOTE — Assessment & Plan Note (Signed)
 Uncontrolled not taking med as prescribed DASH diet and commitment to daily physical activity for a minimum of 30 minutes discussed and encouraged, as a part of hypertension management. The importance of attaining a healthy weight is also discussed.     09/20/2023    1:38 PM 09/20/2023    1:13 PM 05/12/2023   11:34 AM 04/03/2023    5:44 PM 04/03/2023    5:40 PM 01/17/2023    8:16 AM 12/28/2022    9:48 AM  BP/Weight  Systolic BP 168 152 117 143  130 130  Diastolic BP 90 89 73 83  72 81  Wt. (Lbs)  194.6 193.4  180 187.6 185.12  BMI  29.59 kg/m2 29.41 kg/m2  27.37 kg/m2 28.52 kg/m2 28.15 kg/m2

## 2023-09-20 NOTE — Assessment & Plan Note (Signed)

## 2023-09-20 NOTE — Assessment & Plan Note (Signed)
 Imprived on current mregime and managed by psych

## 2023-09-20 NOTE — Assessment & Plan Note (Signed)
 3 monht h/o incrasingly disab;leing plantar foot pain, limiting ability to walk, refer podiatry

## 2023-09-21 LAB — LIPID PANEL
Chol/HDL Ratio: 3.5 ratio (ref 0.0–5.0)
Cholesterol, Total: 123 mg/dL (ref 100–199)
HDL: 35 mg/dL — ABNORMAL LOW (ref >39)
LDL Chol Calc (NIH): 68 mg/dL (ref 0–99)
Triglycerides: 105 mg/dL (ref 0–149)
VLDL Cholesterol Cal: 20 mg/dL (ref 5–40)

## 2023-09-21 LAB — CMP14+EGFR
ALT: 23 IU/L (ref 0–44)
AST: 23 IU/L (ref 0–40)
Albumin: 4.3 g/dL (ref 3.9–4.9)
Alkaline Phosphatase: 113 IU/L (ref 44–121)
BUN/Creatinine Ratio: 6 — ABNORMAL LOW (ref 10–24)
BUN: 8 mg/dL (ref 8–27)
Bilirubin Total: 0.5 mg/dL (ref 0.0–1.2)
CO2: 23 mmol/L (ref 20–29)
Calcium: 9.7 mg/dL (ref 8.6–10.2)
Chloride: 99 mmol/L (ref 96–106)
Creatinine, Ser: 1.35 mg/dL — ABNORMAL HIGH (ref 0.76–1.27)
Globulin, Total: 2.7 g/dL (ref 1.5–4.5)
Glucose: 186 mg/dL — ABNORMAL HIGH (ref 70–99)
Potassium: 4.5 mmol/L (ref 3.5–5.2)
Sodium: 137 mmol/L (ref 134–144)
Total Protein: 7 g/dL (ref 6.0–8.5)
eGFR: 59 mL/min/{1.73_m2} — ABNORMAL LOW (ref >59)

## 2023-09-21 LAB — HEMOGLOBIN A1C
Est. average glucose Bld gHb Est-mCnc: 266 mg/dL
Hgb A1c MFr Bld: 10.9 % — ABNORMAL HIGH (ref 4.8–5.6)

## 2023-09-21 LAB — PSA TOTAL (REFLEX TO FREE): Prostate Specific Ag, Serum: 1 ng/mL (ref 0.0–4.0)

## 2023-09-29 ENCOUNTER — Other Ambulatory Visit: Payer: Self-pay | Admitting: Family Medicine

## 2023-10-05 ENCOUNTER — Telehealth (HOSPITAL_COMMUNITY): Admitting: Psychiatry

## 2023-10-05 ENCOUNTER — Encounter (HOSPITAL_COMMUNITY): Payer: Self-pay | Admitting: Psychiatry

## 2023-10-05 DIAGNOSIS — F322 Major depressive disorder, single episode, severe without psychotic features: Secondary | ICD-10-CM | POA: Diagnosis not present

## 2023-10-05 MED ORDER — TRAZODONE HCL 150 MG PO TABS: 150.0000 mg | ORAL_TABLET | Freq: Every day | ORAL | 2 refills | Status: DC

## 2023-10-05 MED ORDER — ALPRAZOLAM 1 MG PO TABS
1.0000 mg | ORAL_TABLET | Freq: Two times a day (BID) | ORAL | 2 refills | Status: DC
Start: 2023-10-05 — End: 2024-02-29

## 2023-10-05 MED ORDER — FLUOXETINE HCL 40 MG PO CAPS: 40.0000 mg | ORAL_CAPSULE | Freq: Every day | ORAL | 2 refills | Status: DC

## 2023-10-05 MED ORDER — ARIPIPRAZOLE 2 MG PO TABS: 2.0000 mg | ORAL_TABLET | Freq: Every day | ORAL | 2 refills | Status: DC

## 2023-10-05 NOTE — Progress Notes (Signed)
 Virtual Visit via Telephone Note  I connected with [name] on 10/05/23 at  9:40 AM EDT by telephone and verified that I am speaking with the correct person using two identifiers.  Location: Patient: home Provider: office   I discussed the limitations, risks, security and privacy concerns of performing an evaluation and management service by telephone and the availability of in person appointments. I also discussed with the patient that there may be a patient responsible charge related to this service. The patient expressed understanding and agreed to proceed.      I discussed the assessment and treatment plan with the patient. The patient was provided an opportunity to ask questions and all were answered. The patient agreed with the plan and demonstrated an understanding of the instructions.   The patient was advised to call back or seek an in-person evaluation if the symptoms worsen or if the condition fails to improve as anticipated.  I provided 20 minutes of non-face-to-face time during this encounter.   Diannia Ruder, MD  [facility] MD/PA/NP OP Progress Note  10/05/2023 10:03 AM [name]  MRN:  [id number]  Chief Complaint:  Chief Complaint  Patient presents with   Anxiety   Depression   Follow-up   HPI: This patient is a 63 year old black male who is living with his wife in [place]. He has 1 son who died at age 40 in 42. He is on disability.   The patient returns for follow-up after 2 months regarding his depression and anxiety.  Last time he told me that he was not feeling that great had low energy low mood and was somewhat sad.  I did add Abilify for augmentation of Prozac.  He states that he did not understand that he was supposed to take both but has been taking both for about a week.  He does state that his energy is somewhat better and he is getting out and doing more things.  He denies any thoughts of self-harm or suicide.  The patient recently saw his PCP  Dr. Lodema Hong.  His blood sugar and A1c are very elevated and he is having some deterioration of kidney function.  Her office is called him to set up various appointments including endocrinology.  He did not seem to have much understanding of any of this.  I went over this again with him and also explained that he cannot continue to eat carbohydrates the way he is doing.  He obviously needs more education regarding diet. Visit Diagnosis:    ICD-10-CM   1. Major depressive disorder, single episode, severe without psychotic features (HCC)  F32.2 ALPRAZolam (XANAX) 1 MG tablet      Past Psychiatric History: none  Past Medical History:  Past Medical History:  Diagnosis Date   Anxiety    Depression 2007   Diabetes mellitus without complication (HCC)    Helicobacter pylori ab+ 01/21/2019   Dx in 12/2018, will treat   Hyperlipidemia    Hypertension    Hypogonadism male    Obesity (BMI 30.0-34.9) 03/01/2017    Past Surgical History:  Procedure Laterality Date   CIRCUMCISION N/A 06/10/2019   Procedure: CIRCUMCISION ADULT;  Surgeon: Malen Gauze, MD;  Location: [facility];  Service: Urology;  Laterality: N/A;  30 MINS   COLONOSCOPY  01/20/2011   Dr. Darrick Penna: large internal hemorrhoids   COLONOSCOPY WITH PROPOFOL N/A 09/21/2020   Procedure: COLONOSCOPY WITH PROPOFOL;  Surgeon: Lanelle Bal, DO;  Location: [facility] SUITE;  Service:  Endoscopy;  Laterality: N/A;  AM-diabetic   HERNIA REPAIR     ventral hernia repair    VASECTOMY      Family Psychiatric History: none  Family History:  Family History  Problem Relation Age of Onset   Heart failure Mother        living    Hypertension Mother    Hyperlipidemia Mother    Heart disease Mother    Hypertension Sister    Hypertension Brother    Colon cancer Neg Hx     Social History:  Social History   Socioeconomic History   Marital status: Married    Spouse name: Not on file   Number of children: 1   Years of education: Not on file    Highest education level: Not on file  Occupational History   Occupation: Dispensing optician  Tobacco Use   Smoking status: Never   Smokeless tobacco: Never  Vaping Use   Vaping status: Never Used  Substance and Sexual Activity   Alcohol use: Yes    Alcohol/week: 1.0 standard drink of alcohol    Types: 1 Cans of beer per week    Comment: occasional beer socially.    Drug use: No   Sexual activity: Yes  Other Topics Concern   Not on file  Social History Narrative   Not on file   Social Drivers of Health   Financial Resource Strain: Low Risk  (06/09/2021)   Overall Financial Resource Strain (CARDIA)    Difficulty of Paying Living Expenses: Not hard at all  Food Insecurity: No Food Insecurity (06/09/2021)   Hunger Vital Sign    Worried About Running Out of Food in the Last Year: Never true    Ran Out of Food in the Last Year: Never true  Transportation Needs: No Transportation Needs (06/09/2021)   PRAPARE - Administrator, Civil Service (Medical): No    Lack of Transportation (Non-Medical): No  Physical Activity: Sufficiently Active (06/09/2021)   Exercise Vital Sign    Days of Exercise per Week: 7 days    Minutes of Exercise per Session: 40 min  Stress: No Stress Concern Present (06/09/2021)   [employer] of Occupational Health - Occupational Stress Questionnaire    Feeling of Stress : Not at all  Social Connections: Moderately Integrated (06/09/2021)   Social Connection and Isolation Panel [NHANES]    Frequency of Communication with Friends and Family: More than three times a week    Frequency of Social Gatherings with Friends and Family: More than three times a week    Attends Religious Services: More than 4 times per year    Active Member of [employer] or Organizations: No    Attends Banker Meetings: Never    Marital Status: Married    Allergies:  Allergies  Allergen Reactions   Januvia [Sitagliptin] Nausea And Vomiting   Poison Oak Extract  [Poison Oak Extract] Dermatitis    Metabolic Disorder Labs: Lab Results  Component Value Date   HGBA1C 10.9 (H) 09/20/2023   MPG 114.02 06/10/2019   MPG 140 02/26/2019   No results found for: "PROLACTIN" Lab Results  Component Value Date   CHOL 123 09/20/2023   TRIG 105 09/20/2023   HDL 35 (L) 09/20/2023   CHOLHDL 3.5 09/20/2023   VLDL 64 (H) 12/30/2016   LDLCALC 68 09/20/2023   LDLCALC 184 (H) 08/09/2022   Lab Results  Component Value Date   TSH 1.830 11/04/2021   TSH 1.850 07/03/2020  Therapeutic Level Labs: No results found for: "LITHIUM" No results found for: "VALPROATE" No results found for: "CBMZ"  Current Medications: Current Outpatient Medications  Medication Sig Dispense Refill   Accu-Chek FastClix Lancets MISC Use to monitor glucose twice daily 102 each 6   ALPRAZolam (XANAX) 1 MG tablet Take 1 tablet (1 mg total) by mouth 2 (two) times daily. 60 tablet 2   ARIPiprazole (ABILIFY) 2 MG tablet Take 1 tablet (2 mg total) by mouth daily. 30 tablet 2   aspirin EC 81 MG tablet Take 81 mg by mouth daily as needed. Swallow whole.     Blood Glucose Monitoring Suppl (ACCU-CHEK GUIDE ME) w/Device KIT 1 Device by Does not apply route 4 (four) times daily. 1 kit 0   carvedilol (COREG) 3.125 MG tablet Take 1 tablet (3.125 mg total) by mouth 2 (two) times daily with a meal. 60 tablet 3   Cholecalciferol (VITAMIN D3) 1.25 MG (50000 UT) CAPS Take by mouth.     FLUoxetine (PROZAC) 40 MG capsule Take 1 capsule (40 mg total) by mouth daily. 30 capsule 2   glucose blood (ACCU-CHEK GUIDE) test strip Use as instructed 100 each 12   insulin glargine (LANTUS SOLOSTAR) 100 UNIT/ML Solostar Pen Inject 40 Units into the skin at bedtime. 36 mL 0   Insulin Pen Needle (B-D ULTRAFINE III SHORT PEN) 31G X 8 MM MISC Use to inject insulin once daily 100 each 3   lisinopril-hydrochlorothiazide (ZESTORETIC) 20-25 MG tablet Take 1 tablet by mouth daily. 90 tablet 0   metFORMIN (GLUCOPHAGE) 500  MG tablet Take 1 tablet (500 mg total) by mouth 2 (two) times daily with a meal. 180 tablet 3   rosuvastatin (CRESTOR) 40 MG tablet Take 1 tablet by mouth once daily 90 tablet 0   traZODone (DESYREL) 150 MG tablet Take 1 tablet (150 mg total) by mouth at bedtime. 30 tablet 2   No current facility-administered medications for this visit.     Musculoskeletal: Strength & Muscle Tone: na Gait & Station: na Patient leans: N/A  Psychiatric Specialty Exam: Review of Systems  All other systems reviewed and are negative.   There were no vitals taken for this visit.There is no height or weight on file to calculate BMI.  General Appearance: NA  Eye Contact:  NA  Speech:  Clear and Coherent  Volume:  Normal  Mood:  Euthymic  Affect:  NA  Thought Process:  Goal Directed  Orientation:  Full (Time, Place, and Person)  Thought Content: WDL   Suicidal Thoughts:  No  Homicidal Thoughts:  No  Memory:  Immediate;   Good Recent;   Fair Remote;   NA  Judgement:  Fair  Insight:  Shallow  Psychomotor Activity:  Normal  Concentration:  Concentration: Fair and Attention Span: Fair  Recall:  [employer] of Knowledge: Fair  Language: Good  Akathisia:  No  Handed:  Right  AIMS (if indicated): not done  Assets:  Communication Skills Desire for Improvement Resilience Social Support  ADL's:  Intact  Cognition: Impaired,  Mild  Sleep:  Fair   Screenings: GAD-7    Flowsheet Row Office Visit from 09/20/2023 in [facility] Office Visit from 12/28/2022 in [facility] Office Visit from 10/25/2022 in [facility] Office Visit from 08/09/2022 in [facility]  Total GAD-7 Score 7 17 0 0      PHQ2-9    Flowsheet Row Office Visit  from 09/20/2023 in [facility] Video Visit from 01/11/2023 in [facility] [place] Office Visit from 12/28/2022 in [facility] Office Visit from [facility] Office Visit from 10/04/2022 in [facility]  PHQ-2 Total Score 1 4 6  0 6  PHQ-9 Total Score -- 15 21 0 --      Flowsheet Row ED from 04/03/2023 in [facility] Emergency Department at [facility] ED from 10/25/2022 in [facility] Emergency Department at [facility] Video Visit from 03/15/2022 in [facility] [place]  C-SSRS RISK CATEGORY No Risk No Risk No Risk        Assessment and Plan: This patient is a 63 year old male with a history of depression anxiety and cognitive impairment.  He seems to be doing somewhat better in terms of mood.  He will continue Prozac 40 mg daily for depression, Abilify 2 mg daily for augmentation, Xanax 1 mg twice daily as needed for anxiety and trazodone 150 mg at bedtime for sleep.  He will return to see me in 3 months  Collaboration of Care: Collaboration of Care: Primary Care Provider AEB notes are shared with PCP on the epic system  Patient/Guardian was advised Release of Information must be obtained prior to any record release in order to collaborate their care with an outside provider. Patient/Guardian was advised if they have not already done so to contact the registration department to sign all necessary forms in order for us  to release information regarding their care.   Consent: Patient/Guardian gives verbal consent for treatment and assignment of benefits for services provided during this visit. Patient/Guardian expressed understanding and agreed to proceed.    Alfredia Annas, MD 10/05/2023, 10:03 AM

## 2023-10-09 ENCOUNTER — Ambulatory Visit (INDEPENDENT_AMBULATORY_CARE_PROVIDER_SITE_OTHER): Payer: Medicare Other

## 2023-10-09 VITALS — Ht 68.0 in | Wt 185.0 lb

## 2023-10-09 DIAGNOSIS — H579 Unspecified disorder of eye and adnexa: Secondary | ICD-10-CM

## 2023-10-09 DIAGNOSIS — Z0001 Encounter for general adult medical examination with abnormal findings: Secondary | ICD-10-CM | POA: Diagnosis not present

## 2023-10-09 DIAGNOSIS — Z794 Long term (current) use of insulin: Secondary | ICD-10-CM

## 2023-10-09 DIAGNOSIS — E119 Type 2 diabetes mellitus without complications: Secondary | ICD-10-CM

## 2023-10-09 DIAGNOSIS — Z0100 Encounter for examination of eyes and vision without abnormal findings: Secondary | ICD-10-CM

## 2023-10-09 DIAGNOSIS — Z0000 Encounter for general adult medical examination without abnormal findings: Secondary | ICD-10-CM

## 2023-10-09 NOTE — Progress Notes (Signed)
 Please attest and cosign this visit due to patients primary care provider not being in the office at the time the visit was completed.  Because this visit was a virtual/telehealth visit,  certain criteria was not obtained, such a blood pressure, CBG if applicable, and timed get up and go. Any medications not marked as "taking" were not mentioned during the medication reconciliation part of the visit. Any vitals not documented were not able to be obtained due to this being a telehealth visit or patient was unable to self-report a recent blood pressure reading due to a lack of equipment at home via telehealth. Vitals that have been documented are verbally provided by the patient.   Subjective:   John [name] is a 63 y.o. who presents for a Medicare Wellness preventive visit.  Visit Complete: Virtual I connected with  John [name] on 10/09/23 by a audio enabled telemedicine application and verified that I am speaking with the correct person using two identifiers.  Patient Location: Other:  home  Provider Location: Home Office  I discussed the limitations of evaluation and management by telemedicine. The patient expressed understanding and agreed to proceed.  Vital Signs: Because this visit was a virtual/telehealth visit, some criteria may be missing or patient reported. Any vitals not documented were not able to be obtained and vitals that have been documented are patient reported.  VideoDeclined- This patient declined Librarian, academic. Therefore the visit was completed with audio only.  Persons Participating in Visit: Patient.  AWV Questionnaire: No: Patient Medicare AWV questionnaire was not completed prior to this visit.  Cardiac Risk Factors include: advanced age (>80men, >30 women);diabetes mellitus;dyslipidemia;hypertension;male gender;sedentary lifestyle     Objective:    Today's Vitals   10/09/23 0847  Weight: 185 lb (83.9 kg)  Height: 5\' 8"   (1.727 m)   Body mass index is 28.13 kg/m.     10/09/2023    8:50 AM 04/03/2023    5:41 PM 10/25/2022    5:59 PM 10/04/2022    9:39 AM 10/12/2021    9:19 AM 06/09/2021    8:20 AM 09/21/2020    7:52 AM  Advanced Directives  Does Patient Have a Medical Advance Directive? No No No No No No No  Would patient like information on creating a medical advance directive? No - Patient declined   Yes (MAU/Ambulatory/Procedural Areas - Information given)  Yes (MAU/Ambulatory/Procedural Areas - Information given) No - Patient declined    Current Medications (verified) Outpatient Encounter Medications as of 10/09/2023  Medication Sig   Accu-Chek FastClix Lancets MISC Use to monitor glucose twice daily   ALPRAZolam  (XANAX ) 1 MG tablet Take 1 tablet (1 mg total) by mouth 2 (two) times daily.   ARIPiprazole  (ABILIFY ) 2 MG tablet Take 1 tablet (2 mg total) by mouth daily.   aspirin  EC 81 MG tablet Take 81 mg by mouth daily as needed. Swallow whole.   Blood Glucose Monitoring Suppl (ACCU-CHEK GUIDE ME) w/Device KIT 1 Device by Does not apply route 4 (four) times daily.   carvedilol  (COREG ) 3.125 MG tablet Take 1 tablet (3.125 mg total) by mouth 2 (two) times daily with a meal.   Cholecalciferol (VITAMIN D3) 1.25 MG (50000 UT) CAPS Take by mouth.   FLUoxetine  (PROZAC ) 40 MG capsule Take 1 capsule (40 mg total) by mouth daily.   glucose blood (ACCU-CHEK GUIDE) test strip Use as instructed   insulin  glargine (LANTUS  SOLOSTAR) 100 UNIT/ML Solostar Pen Inject 40 Units into the skin  at bedtime.   Insulin  Pen Needle (B-D ULTRAFINE III SHORT PEN) 31G X 8 MM MISC Use to inject insulin  once daily   lisinopril -hydrochlorothiazide  (ZESTORETIC ) 20-25 MG tablet Take 1 tablet by mouth daily.   metFORMIN  (GLUCOPHAGE ) 500 MG tablet Take 1 tablet (500 mg total) by mouth 2 (two) times daily with a meal.   rosuvastatin  (CRESTOR ) 40 MG tablet Take 1 tablet by mouth once daily   traZODone  (DESYREL ) 150 MG tablet Take 1 tablet  (150 mg total) by mouth at bedtime.   No facility-administered encounter medications on file as of 10/09/2023.    Allergies (verified) Januvia  [sitagliptin ] and Poison oak extract [poison oak extract]   History: Past Medical History:  Diagnosis Date   Anxiety    Depression 2007   Diabetes mellitus without complication (HCC)    Helicobacter pylori ab+ 01/21/2019   Dx in 12/2018, will treat   Hyperlipidemia    Hypertension    Hypogonadism male    Obesity (BMI 30.0-34.9) 03/01/2017   Past Surgical History:  Procedure Laterality Date   CIRCUMCISION N/A 06/10/2019   Procedure: CIRCUMCISION ADULT;  Surgeon: Marco Severs, MD;  Location: [facility];  Service: Urology;  Laterality: N/A;  30 MINS   COLONOSCOPY  01/20/2011   Dr. Nolene Baumgarten: large internal hemorrhoids   COLONOSCOPY WITH PROPOFOL  N/A 09/21/2020   Procedure: COLONOSCOPY WITH PROPOFOL ;  Surgeon: Vinetta Greening, DO;  Location: [facility];  Service: Endoscopy;  Laterality: N/A;  AM-diabetic   HERNIA REPAIR     ventral hernia repair    VASECTOMY     Family History  Problem Relation Age of Onset   Heart failure Mother        living    Hypertension Mother    Hyperlipidemia Mother    Heart disease Mother    Hypertension Sister    Hypertension Brother    Colon cancer Neg Hx    Social History   Socioeconomic History   Marital status: Married    Spouse name: Not on file   Number of children: 1   Years of education: Not on file   Highest education level: Not on file  Occupational History   Occupation: Dispensing optician  Tobacco Use   Smoking status: Never   Smokeless tobacco: Never  Vaping Use   Vaping status: Never Used  Substance and Sexual Activity   Alcohol use: Yes    Alcohol/week: 1.0 standard drink of alcohol    Types: 1 Cans of beer per week    Comment: occasional beer socially.    Drug use: No   Sexual activity: Yes  Other Topics Concern   Not on file  Social History Narrative   Not on file   Social  Drivers of Health   Financial Resource Strain: Low Risk  (10/09/2023)   Overall Financial Resource Strain (CARDIA)    Difficulty of Paying Living Expenses: Not hard at all  Food Insecurity: No Food Insecurity (10/09/2023)   Hunger Vital Sign    Worried About Running Out of Food in the Last Year: Never true    Ran Out of Food in the Last Year: Never true  Transportation Needs: No Transportation Needs (10/09/2023)   PRAPARE - Administrator, Civil Service (Medical): No    Lack of Transportation (Non-Medical): No  Physical Activity: Sufficiently Active (10/09/2023)   Exercise Vital Sign    Days of Exercise per Week: 7 days    Minutes of Exercise per Session: 30 min  Stress: No Stress Concern Present (10/09/2023)   [employer] of Occupational Health - Occupational Stress Questionnaire    Feeling of Stress : Only a little  Social Connections: Moderately Isolated (10/09/2023)   Social Connection and Isolation Panel [NHANES]    Frequency of Communication with Friends and Family: Once a week    Frequency of Social Gatherings with Friends and Family: Never    Attends Religious Services: More than 4 times per year    Active Member of [employer] or Organizations: No    Attends Engineer, structural: Never    Marital Status: Married    Tobacco Counseling Counseling given: Yes    Clinical Intake:  Pre-visit preparation completed: Yes  Pain : No/denies pain     BMI - recorded: 28.13 Nutritional Status: BMI 25 -29 Overweight Nutritional Risks: None Diabetes: No  Lab Results  Component Value Date   HGBA1C 10.9 (H) 09/20/2023   HGBA1C 8.4 (A) 05/12/2023   HGBA1C 6.6 (A) 01/17/2023     How often do you need to have someone help you when you read instructions, pamphlets, or other written materials from your doctor or pharmacy?: 5 - Always  Interpreter Needed?: No  Information entered by :: Sally Crazier CMA   Activities of Daily Living     10/09/2023    8:48 AM   In your present state of health, do you have any difficulty performing the following activities:  Hearing? 0  Vision? 0  Difficulty concentrating or making decisions? 0  Walking or climbing stairs? 0  Dressing or bathing? 0  Doing errands, shopping? 0  Preparing Food and eating ? N  Using the Toilet? N  In the past six months, have you accidently leaked urine? N  Do you have problems with loss of bowel control? N  Managing your Medications? N  Managing your Finances? N  Housekeeping or managing your Housekeeping? N    Patient Care Team: Towanda Fret, MD as PCP - General (Family Medicine) Mallipeddi, Kennyth Pean, MD as PCP - Cardiology (Cardiology) Alyce Jubilee, MD (Inactive) (Gastroenterology) Vinetta Greening, DO as Consulting Physician (Internal Medicine)  Indicate any recent Medical Services you may have received from other than [facility] providers in the past year (date may be approximate).     Assessment:   This is a routine wellness examination for Ebin.  Hearing/Vision screen Hearing Screening - Comments:: Patient denies any hearing difficulties.   Vision Screening - Comments:: Patient is not up to date on yearly eye exams.  Referral to ophthalmology placed.     Goals Addressed             This Visit's Progress    Patient Stated       I'd like to be able to go fishing more. I'm currently fishing right now       Depression Screen     10/09/2023    8:53 AM 09/20/2023    1:14 PM 01/11/2023    9:29 AM 12/28/2022   10:01 AM 12/28/2022    9:49 AM 10/25/2022    4:03 PM 10/04/2022    9:41 AM  PHQ 2/9 Scores  PHQ - 2 Score 2 1  6 2  0 6  PHQ- 9 Score 12   21 11  0      Information is confidential and restricted. Go to Review Flowsheets to unlock data.    Fall Risk     10/09/2023    8:50 AM 09/20/2023    1:14  PM 12/28/2022    9:49 AM 10/25/2022    4:03 PM 10/04/2022    9:41 AM  Fall Risk   Falls in the past year? 0 0 0 0 0  Number falls in past yr: 0 0 0 0    Injury with Fall? 0 0 0 0   Risk for fall due to : No Fall Risks  No Fall Risks No Fall Risks   Follow up Falls prevention discussed;Falls evaluation completed  Falls evaluation completed Falls evaluation completed     MEDICARE RISK AT HOME:  Medicare Risk at Home Any stairs in or around the home?: Yes If so, are there any without handrails?: Yes Home free of loose throw rugs in walkways, pet beds, electrical cords, etc?: No Adequate lighting in your home to reduce risk of falls?: No Life alert?: No Use of a cane, walker or w/c?: No Grab bars in the bathroom?: No Shower chair or bench in shower?: No Elevated toilet seat or a handicapped toilet?: No  TIMED UP AND GO:  Was the test performed?  No  Cognitive Function: Declined: Patient declined cognitive screening, but was able to answer questions in an accurate and timely manner. No cognitive impairments observed.    06/09/2021    8:22 AM  MMSE - Mini Mental State Exam  Not completed: Unable to complete        10/09/2023    8:51 AM 10/04/2022    9:41 AM 06/09/2021    8:22 AM 05/28/2020   10:20 AM 05/28/2020   10:17 AM  6CIT Screen  What Year? 0 points 0 points 0 points 0 points 0 points  What month? 0 points 0 points 0 points 0 points 0 points  What time? 0 points 0 points 0 points 0 points   Count back from 20 0 points 0 points 0 points 0 points   Months in reverse 0 points 4 points 4 points    Repeat phrase 0 points 2 points 10 points    Total Score 0 points 6 points 14 points      Immunizations Immunization History  Administered Date(s) Administered   Influenza Whole 03/18/2010, 04/13/2011   Influenza,inj,Quad PF,6+ Mos 05/20/2013, 05/08/2014, 06/11/2015, 04/18/2017, 05/23/2018, 03/18/2019, 02/12/2020, 05/07/2021, 04/07/2022   Janssen (J&J) SARS-COV-2 Vaccination 11/02/2019   PNEUMOCOCCAL CONJUGATE-20 05/07/2021   Pfizer Covid-19 Vaccine Bivalent Booster 19yrs & up 06/21/2021   Pneumococcal Polysaccharide-23  11/26/2013, 03/18/2019   Td 09/15/2009, 01/09/2023   Zoster Recombinant(Shingrix) 06/21/2021, 01/09/2023    Screening Tests Health Maintenance  Topic Date Due   OPHTHALMOLOGY EXAM  06/17/2022   COVID-19 Vaccine (3 - 2024-25 season) 02/19/2023   Diabetic kidney evaluation - Urine ACR  12/28/2023   INFLUENZA VACCINE  01/19/2024   HEMOGLOBIN A1C  03/21/2024   Diabetic kidney evaluation - eGFR measurement  09/19/2024   FOOT EXAM  09/19/2024   Medicare Annual Wellness (AWV)  10/08/2024   Colonoscopy  09/22/2030   DTaP/Tdap/Td (3 - Tdap) 01/08/2033   Pneumococcal Vaccine 70-22 Years old  Completed   Hepatitis C Screening  Completed   HIV Screening  Completed   Zoster Vaccines- Shingrix  Completed   HPV VACCINES  Aged Out   Meningococcal B Vaccine  Aged Out    Health Maintenance  Health Maintenance Due  Topic Date Due   OPHTHALMOLOGY EXAM  06/17/2022   COVID-19 Vaccine (3 - 2024-25 season) 02/19/2023   Health Maintenance Items Addressed: Referral sent to Optometry/Ophthalmology  Additional Screening:  Vision Screening: Recommended  annual ophthalmology exams for early detection of glaucoma and other disorders of the eye.  Dental Screening: Recommended annual dental exams for proper oral hygiene  Community Resource Referral / Chronic Care Management: CRR required this visit?  No   CCM required this visit?  No     Plan:     I have personally reviewed and noted the following in the patient's chart:   Medical and social history Use of alcohol, tobacco or illicit drugs  Current medications and supplements including opioid prescriptions. Patient is not currently taking opioid prescriptions. Functional ability and status Nutritional status Physical activity Advanced directives List of other physicians Hospitalizations, surgeries, and ER visits in previous 12 months Vitals Screenings to include cognitive, depression, and falls Referrals and appointments  In  addition, I have reviewed and discussed with patient certain preventive protocols, quality metrics, and best practice recommendations. A written personalized care plan for preventive services as well as general preventive health recommendations were provided to patient.      Majesty Stehlin, CMA   10/09/2023   After Visit Summary: (Mail) Due to this being a telephonic visit, the after visit summary with patients personalized plan was offered to patient via mail   Notes: Please refer to Routing Comments.

## 2023-10-09 NOTE — Patient Instructions (Signed)
 John [name] , Thank you for taking time to come for your Medicare Wellness Visit. I appreciate your ongoing commitment to your health goals. Please review the following plan we discussed and let me know if I can assist you in the future.   Referrals/Orders/Follow-Ups/Clinician Recommendations:  Next Medicare AWV: October 09, 2024 at 8:40 am TELEPHONE VISIT  John [name] been referred to [facility]. You can call them to schedule your appointment Address: [address], [place], [place] [address] Phone: [phone] Aim for 30 minutes of exercise or brisk walking, 6-8 glasses of water , and 5 servings of fruits and vegetables each day.  This is a list of the screening recommended for you and due dates:  Health Maintenance  Topic Date Due   Eye exam for diabetics  06/17/2022   COVID-19 Vaccine (3 - 2024-25 season) 02/19/2023   Yearly kidney health urinalysis for diabetes  12/28/2023   Flu Shot  01/19/2024   Hemoglobin A1C  03/21/2024   Yearly kidney function blood test for diabetes  09/19/2024   Complete foot exam   09/19/2024   Medicare Annual Wellness Visit  10/08/2024   Colon Cancer Screening  09/22/2030   DTaP/Tdap/Td vaccine (3 - Tdap) 01/08/2033   Pneumococcal Vaccination  Completed   Hepatitis C Screening  Completed   HIV Screening  Completed   Zoster (Shingles) Vaccine  Completed   HPV Vaccine  Aged Out   Meningitis B Vaccine  Aged Out    Advanced directives: (Declined) Advance directive discussed with you today. Even though you declined this today, please call our office should you change your mind, and we can give you the proper paperwork for you to fill out. Advance Care Planning is important because it:  [x]  Makes sure you receive the medical care that is consistent with your values, goals, and preferences  [x]  It provides guidance to your family and loved ones and it also reduces their decisional burden about whether or not they are making the right decisions  based on what you want done  Follow the link provided in your after visit summary or read over the paperwork we have mailed to you to help you started getting your Advance Directives in place. If you need assistance in completing these, please reach out to us  so that we can help you!   Next Medicare Annual Wellness Visit scheduled for next year: yes  Understanding Your Risk for Falls Millions of people have serious injuries from falls each year. It is important to understand your risk of falling. Talk with your health care provider about your risk and what you can do to lower it. If you do have a serious fall, make sure to tell your provider. Falling once raises your risk of falling again. How can falls affect me? Serious injuries from falls are common. These include: Broken bones, such as hip fractures. Head injuries, such as traumatic brain injuries (TBI) or concussions. A fear of falling can cause you to avoid activities and stay at home. This can make your muscles weaker and raise your risk for a fall. What can increase my risk? There are a number of risk factors that increase your risk for falling. The more risk factors you have, the higher your risk of falling. Serious injuries from a fall happen most often to people who are older than 64 years old. Teenagers and young adults ages 24-29 are also at higher risk. Common risk factors include: Weakness in the lower body. Being generally  weak or confused due to long-term (chronic) illness. Dizziness or balance problems. Poor vision. Medicines that cause dizziness or drowsiness. These may include: Medicines for your blood pressure, heart, anxiety, insomnia, or swelling (edema). Pain medicines. Muscle relaxants. Other risk factors include: Drinking alcohol. Having had a fall in the past. Having foot pain or wearing improper footwear. Working at a dangerous job. Having any of the following in your home: Tripping hazards, such as floor  clutter or loose rugs. Poor lighting. Pets. Having dementia or memory loss. What actions can I take to lower my risk of falling?     Physical activity Stay physically fit. Do strength and balance exercises. Consider taking a regular class to build strength and balance. Yoga and tai chi are good options. Vision Have your eyes checked every year and your prescription for glasses or contacts updated as needed. Shoes and walking aids Wear non-skid shoes. Wear shoes that have rubber soles and low heels. Do not wear high heels. Do not walk around the house in socks or slippers. Use a cane or walker as told by your provider. Home safety Attach secure railings on both sides of your stairs. Install grab bars for your bathtub, shower, and toilet. Use a non-skid mat in your bathtub or shower. Attach bath mats securely with double-sided, non-slip rug tape. Use good lighting in all rooms. Keep a flashlight near your bed. Make sure there is a clear path from your bed to the bathroom. Use night-lights. Do not use throw rugs. Make sure all carpeting is taped or tacked down securely. Remove all clutter from walkways and stairways, including extension cords. Repair uneven or broken steps and floors. Avoid walking on icy or slippery surfaces. Walk on the grass instead of on icy or slick sidewalks. Use ice melter to get rid of ice on walkways in the winter. Use a cordless phone. Questions to ask your health care provider Can you help me check my risk for a fall? Do any of my medicines make me more likely to fall? Should I take a vitamin D  supplement? What exercises can I do to improve my strength and balance? Should I make an appointment to have my vision checked? Do I need a bone density test to check for weak bones (osteoporosis)? Would it help to use a cane or a walker? Where to find more information Centers for Disease Control and Prevention, STEADI: TonerPromos.no Community-Based Fall Prevention  Programs: TonerPromos.no [employer] on Aging: BaseRingTones.pl Contact a health care provider if: You fall at home. You are afraid of falling at home. You feel weak, drowsy, or dizzy. This information is not intended to replace advice given to you by your health care provider. Make sure you discuss any questions you have with your health care provider. Document Revised: 02/07/2022 Document Reviewed: 02/07/2022 Elsevier Patient Education  2024 [employer].

## 2023-11-15 ENCOUNTER — Ambulatory Visit

## 2023-11-17 ENCOUNTER — Telehealth: Payer: Self-pay | Admitting: Nurse Practitioner

## 2023-11-17 NOTE — Telephone Encounter (Signed)
 Pt was seeing if sensors had been ordered?  Does he get through [employer]?

## 2023-11-17 NOTE — Telephone Encounter (Signed)
 He does get them through [employer].  I sent in a message to see if they were on the way.

## 2023-11-24 ENCOUNTER — Ambulatory Visit (INDEPENDENT_AMBULATORY_CARE_PROVIDER_SITE_OTHER): Admitting: Family Medicine

## 2023-11-24 ENCOUNTER — Encounter: Payer: Self-pay | Admitting: Family Medicine

## 2023-11-24 VITALS — BP 155/88 | HR 71 | Resp 16 | Ht 68.0 in | Wt 189.1 lb

## 2023-11-24 DIAGNOSIS — E1165 Type 2 diabetes mellitus with hyperglycemia: Secondary | ICD-10-CM | POA: Diagnosis not present

## 2023-11-24 DIAGNOSIS — Z794 Long term (current) use of insulin: Secondary | ICD-10-CM | POA: Diagnosis not present

## 2023-11-24 DIAGNOSIS — E782 Mixed hyperlipidemia: Secondary | ICD-10-CM

## 2023-11-24 DIAGNOSIS — G4700 Insomnia, unspecified: Secondary | ICD-10-CM | POA: Diagnosis not present

## 2023-11-24 DIAGNOSIS — E663 Overweight: Secondary | ICD-10-CM

## 2023-11-24 DIAGNOSIS — I10 Essential (primary) hypertension: Secondary | ICD-10-CM

## 2023-11-24 DIAGNOSIS — F411 Generalized anxiety disorder: Secondary | ICD-10-CM

## 2023-11-24 DIAGNOSIS — F322 Major depressive disorder, single episode, severe without psychotic features: Secondary | ICD-10-CM

## 2023-11-24 MED ORDER — OLMESARTAN MEDOXOMIL-HCTZ 20-12.5 MG PO TABS: 1.0000 | ORAL_TABLET | Freq: Every day | ORAL | 3 refills | Status: DC

## 2023-11-24 NOTE — Patient Instructions (Addendum)
 F/U July 16 or after, call if you need me sooner  New medication for blood pressure olmesartan/hydrochlorothiazide  20/12.5 ONE daily  Urine ACR and non fasting lab work 7/11 please or shortly after  Glad mental health is much better  You are referred for eye exam call and schedule on [address]  Thanks for choosing [facility], we consider it a privelige to serve you.

## 2023-11-24 NOTE — Assessment & Plan Note (Signed)
 Unontrolled, non compliant, new is benicar/hydrochlorothiazide  20/12.5 one daily DASH diet and commitment to daily physical activity for a minimum of 30 minutes discussed and encouraged, as a part of hypertension management. The importance of attaining a healthy weight is also discussed.     11/24/2023    1:21 PM 11/24/2023   12:59 PM 10/09/2023    8:47 AM 09/20/2023    1:38 PM 09/20/2023    1:13 PM 05/12/2023   11:34 AM 04/03/2023    5:44 PM  BP/Weight  Systolic BP 155 161 -- 168 152 117 143  Diastolic BP 88 91 -- 90 89 73 83  Wt. (Lbs)  189.12 185  194.6 193.4   BMI  28.76 kg/m2 28.13 kg/m2  29.59 kg/m2 29.41 kg/m2

## 2023-11-24 NOTE — Assessment & Plan Note (Signed)
 Diabetes associated with hypertension, hyperlipidemia, and depression  Mr. Salois is reminded of the importance of commitment to daily physical activity for 30 minutes or more, as able and the need to limit carbohydrate intake to 30 to 60 grams per meal to help with blood sugar control.   The need to take medication as prescribed, test blood sugar as directed, and to call between visits if there is a concern that blood sugar is uncontrolled is also discussed.   Mr. Stairs is reminded of the importance of daily foot exam, annual eye examination, and good blood sugar, blood pressure and cholesterol control.     Latest Ref Rng & Units 09/20/2023    2:07 PM 05/12/2023   11:42 AM 04/03/2023    6:04 PM 01/17/2023    8:30 AM 12/28/2022   10:40 AM  Diabetic Labs  HbA1c 4.8 - 5.6 % 10.9  8.4   6.6    Micro/Creat Ratio 0 - 29 mg/g creat     161   Chol 100 - 199 mg/dL 161       HDL >09 mg/dL 35       Calc LDL 0 - 99 mg/dL 68       Triglycerides 0 - 149 mg/dL 604       Creatinine 5.40 - 1.27 mg/dL 9.81   1.91         09/24/8293    1:21 PM 11/24/2023   12:59 PM 10/09/2023    8:47 AM 09/20/2023    1:38 PM 09/20/2023    1:13 PM 05/12/2023   11:34 AM 04/03/2023    5:44 PM  BP/Weight  Systolic BP 155 161 -- 168 152 117 143  Diastolic BP 88 91 -- 90 89 73 83  Wt. (Lbs)  189.12 185  194.6 193.4   BMI  28.76 kg/m2 28.13 kg/m2  29.59 kg/m2 29.41 kg/m2       Latest Ref Rng & Units 09/20/2023    1:00 PM 06/17/2021   12:00 AM  Foot/eye exam completion dates  Eye Exam No Retinopathy  No Retinopathy      Foot Form Completion  Done      This result is from an external source.      Mnaaged by endo, uncontrolled

## 2023-11-25 ENCOUNTER — Encounter: Payer: Self-pay | Admitting: Family Medicine

## 2023-11-25 NOTE — Progress Notes (Unsigned)
 John [name]     MRN: [id number]      DOB: Nov 11, 1960  Chief Complaint  Patient presents with   Diabetes    8 week follow up     HPI John [name] is here for follow up and re-evaluation of chronic medical conditions, medication management and review of any available recent lab and radiology data.  Preventive health is updated, specifically  Cancer screening and Immunization.   Seeing Endo for dia diabetes management Has not been taking one of his BP meds and it is high Depression and anxiety much better on new regime by psych, not suicidal or homicidal and better able to function. ROS Denies recent fever or chills. Denies sinus pressure, nasal congestion, ear pain or sore throat. Denies chest congestion, productive cough or wheezing. Denies chest pains, palpitations and leg swelling Denies abdominal pain, nausea, vomiting,diarrhea or constipation.   Denies dysuria, frequency, hesitancy or incontinence. Denies joint pain, swelling and limitation in mobility. Denies headaches, seizures, numbness, or tingling. Denies  uncontrolled depression, anxiety or insomnia. Denies skin break down or rash.   PE  BP (!) 155/88   Pulse 71   Resp 16   Ht 5\' 8"  (1.727 m)   Wt 189 lb 1.9 oz (85.8 kg)   SpO2 96%   BMI 28.76 kg/m   Patient alert and oriented and in no cardiopulmonary distress.  HEENT: No facial asymmetry, EOMI,     Neck supple .  Chest: Clear to auscultation bilaterally.  CVS: S1, S2 no murmurs, no S3.Regular rate.  ABD: Soft non tender.   Ext: No edema  MS: Adequate ROM spine, shoulders, hips and knees.  Skin: Intact, no ulcerations or rash noted.  Psych: Good eye contact, normal affect. Memory intact not anxious or depressed appearing.  CNS: CN 2-12 intact, power,  normal throughout.no focal deficits noted.   Assessment & Plan  Essential hypertension Unontrolled, non compliant, new is benicar /hydrochlorothiazide  20/12.5 one daily DASH diet and  commitment to daily physical activity for a minimum of 30 minutes discussed and encouraged, as a part of hypertension management. The importance of attaining a healthy weight is also discussed.     11/24/2023    1:21 PM 11/24/2023   12:59 PM 10/09/2023    8:47 AM 09/20/2023    1:38 PM 09/20/2023    1:13 PM 05/12/2023   11:34 AM 04/03/2023    5:44 PM  BP/Weight  Systolic BP 155 161 -- 168 152 117 143  Diastolic BP 88 91 -- 90 89 73 83  Wt. (Lbs)  189.12 185  194.6 193.4   BMI  28.76 kg/m2 28.13 kg/m2  29.59 kg/m2 29.41 kg/m2        Type 2 diabetes mellitus with hyperglycemia (HCC) Diabetes associated with hypertension, hyperlipidemia, and depression  John [name] is reminded of the importance of commitment to daily physical activity for 30 minutes or more, as able and the need to limit carbohydrate intake to 30 to 60 grams per meal to help with blood sugar control.   The need to take medication as prescribed, test blood sugar as directed, and to call between visits if there is a concern that blood sugar is uncontrolled is also discussed.   John [name] is reminded of the importance of daily foot exam, annual eye examination, and good blood sugar, blood pressure and cholesterol control.     Latest Ref Rng & Units 09/20/2023    2:07 PM 05/12/2023   11:42 AM 04/03/2023  6:04 PM 01/17/2023    8:30 AM 12/28/2022   10:40 AM  Diabetic Labs  HbA1c 4.8 - 5.6 % 10.9  8.4   6.6    Micro/Creat Ratio 0 - 29 mg/g creat     161   Chol 100 - 199 mg/dL 161       HDL >09 mg/dL 35       Calc LDL 0 - 99 mg/dL 68       Triglycerides 0 - 149 mg/dL 604       Creatinine 5.40 - 1.27 mg/dL 9.81   1.91         09/24/8293    1:21 PM 11/24/2023   12:59 PM 10/09/2023    8:47 AM 09/20/2023    1:38 PM 09/20/2023    1:13 PM 05/12/2023   11:34 AM 04/03/2023    5:44 PM  BP/Weight  Systolic BP 155 161 -- 168 152 117 143  Diastolic BP 88 91 -- 90 89 73 83  Wt. (Lbs)  189.12 185  194.6 193.4   BMI  28.76 kg/m2  28.13 kg/m2  29.59 kg/m2 29.41 kg/m2       Latest Ref Rng & Units 09/20/2023    1:00 PM 06/17/2021   12:00 AM  Foot/eye exam completion dates  Eye Exam No Retinopathy  No Retinopathy      Foot Form Completion  Done      This result is from an external source.      Mnaaged by endo, uncontrolled   Mixed hyperlipidemia Hyperlipidemia:Low fat diet discussed and encouraged.   Lipid Panel  Lab Results  Component Value Date   CHOL 123 09/20/2023   HDL 35 (L) 09/20/2023   LDLCALC 68 09/20/2023   TRIG 105 09/20/2023   CHOLHDL 3.5 09/20/2023     Needs to inc exercise , no med change  Overweight (BMI 25.0-29.9)  Patient re-educated about  the importance of commitment to a  minimum of 150 minutes of exercise per week as able.  The importance of healthy food choices with portion control discussed, as well as eating regularly and within a 12 hour window most days. The need to choose "clean , green" food 50 to 75% of the time is discussed, as well as to make water  the primary drink and set a goal of 64 ounces water  daily.       11/24/2023   12:59 PM 10/09/2023    8:47 AM 09/20/2023    1:13 PM  Weight /BMI  Weight 189 lb 1.9 oz 185 lb 194 lb 9.6 oz  Height 5\' 8"  (1.727 m) 5\' 8"  (1.727 m) 5\' 8"  (1.727 m)  BMI 28.76 kg/m2 28.13 kg/m2 29.59 kg/m2      Insomnia Managed on current med by Psych  GAD (generalized anxiety disorder) Improved, med management by Psych  Severe major depression without psychotic features (HCC) Improved, med management by Psych

## 2023-11-26 NOTE — Assessment & Plan Note (Signed)
 Managed on current med by Psych

## 2023-11-26 NOTE — Assessment & Plan Note (Signed)
 Improved, med management by Psych

## 2023-11-26 NOTE — Assessment & Plan Note (Signed)
 Hyperlipidemia:Low fat diet discussed and encouraged.   Lipid Panel  Lab Results  Component Value Date   CHOL 123 09/20/2023   HDL 35 (L) 09/20/2023   LDLCALC 68 09/20/2023   TRIG 105 09/20/2023   CHOLHDL 3.5 09/20/2023     Needs to inc exercise , no med change

## 2023-11-26 NOTE — Assessment & Plan Note (Signed)
  Patient re-educated about  the importance of commitment to a  minimum of 150 minutes of exercise per week as able.  The importance of healthy food choices with portion control discussed, as well as eating regularly and within a 12 hour window most days. The need to choose "clean , green" food 50 to 75% of the time is discussed, as well as to make water  the primary drink and set a goal of 64 ounces water  daily.       11/24/2023   12:59 PM 10/09/2023    8:47 AM 09/20/2023    1:13 PM  Weight /BMI  Weight 189 lb 1.9 oz 185 lb 194 lb 9.6 oz  Height 5\' 8"  (1.727 m) 5\' 8"  (1.727 m) 5\' 8"  (1.727 m)  BMI 28.76 kg/m2 28.13 kg/m2 29.59 kg/m2

## 2024-01-02 ENCOUNTER — Ambulatory Visit (INDEPENDENT_AMBULATORY_CARE_PROVIDER_SITE_OTHER): Admitting: Nurse Practitioner

## 2024-01-02 ENCOUNTER — Encounter: Payer: Self-pay | Admitting: Nurse Practitioner

## 2024-01-02 VITALS — BP 130/80 | HR 82 | Ht 68.0 in | Wt 190.2 lb

## 2024-01-02 DIAGNOSIS — Z794 Long term (current) use of insulin: Secondary | ICD-10-CM

## 2024-01-02 DIAGNOSIS — E1165 Type 2 diabetes mellitus with hyperglycemia: Secondary | ICD-10-CM | POA: Diagnosis not present

## 2024-01-02 DIAGNOSIS — E782 Mixed hyperlipidemia: Secondary | ICD-10-CM

## 2024-01-02 DIAGNOSIS — Z7984 Long term (current) use of oral hypoglycemic drugs: Secondary | ICD-10-CM | POA: Diagnosis not present

## 2024-01-02 LAB — POCT GLYCOSYLATED HEMOGLOBIN (HGB A1C): Hemoglobin A1C: 7.9 % — AB (ref 4.0–5.6)

## 2024-01-02 MED ORDER — METFORMIN HCL 500 MG PO TABS: 500.0000 mg | ORAL_TABLET | Freq: Two times a day (BID) | ORAL | 3 refills | Status: DC

## 2024-01-02 MED ORDER — LANTUS SOLOSTAR 100 UNIT/ML ~~LOC~~ SOPN: 40.0000 [IU] | PEN_INJECTOR | Freq: Every day | SUBCUTANEOUS | 3 refills | Status: DC

## 2024-01-02 NOTE — Progress Notes (Signed)
 01/02/2024    Endocrinology Follow Up Visit    Subjective:    Patient ID: John [name], male    DOB: May 01, 1961. Patient is being engaged in follow-up for management of chronically uncontrolled type 2 diabetes, hyperlipidemia, hypertension. PMD:   Antonetta Rollene BRAVO, MD  Past Medical History:  Diagnosis Date   Anxiety    Depression 2007   Diabetes mellitus without complication (HCC)    Helicobacter pylori ab+ 01/21/2019   Dx in 12/2018, will treat   Hyperlipidemia    Hypertension    Hypogonadism male    Obesity (BMI 30.0-34.9) 03/01/2017   Past Surgical History:  Procedure Laterality Date   CIRCUMCISION N/A 06/10/2019   Procedure: CIRCUMCISION ADULT;  Surgeon: Sherrilee Belvie CROME, MD;  Location: [facility];  Service: Urology;  Laterality: N/A;  30 MINS   COLONOSCOPY  01/20/2011   Dr. Harvey: large internal hemorrhoids   COLONOSCOPY WITH PROPOFOL  N/A 09/21/2020   Procedure: COLONOSCOPY WITH PROPOFOL ;  Surgeon: Cindie Carlin MARLA, DO;  Location: [facility];  Service: Endoscopy;  Laterality: N/A;  AM-diabetic   HERNIA REPAIR     ventral hernia repair    VASECTOMY     Social History   Socioeconomic History   Marital status: Married    Spouse name: Not on file   Number of children: 1   Years of education: Not on file   Highest education level: Not on file  Occupational History   Occupation: Dispensing optician  Tobacco Use   Smoking status: Never   Smokeless tobacco: Never  Vaping Use   Vaping status: Never Used  Substance and Sexual Activity   Alcohol use: Yes    Alcohol/week: 1.0 standard drink of alcohol    Types: 1 Cans of beer per week    Comment: occasional beer socially.    Drug use: No   Sexual activity: Yes  Other Topics Concern   Not on file  Social History Narrative   Not on file   Social Drivers of Health   Financial Resource Strain: Low Risk  (10/09/2023)   Overall Financial Resource Strain (CARDIA)    Difficulty of Paying Living Expenses: Not hard at all   Food Insecurity: No Food Insecurity (10/09/2023)   Hunger Vital Sign    Worried About Running Out of Food in the Last Year: Never true    Ran Out of Food in the Last Year: Never true  Transportation Needs: No Transportation Needs (10/09/2023)   PRAPARE - Administrator, Civil Service (Medical): No    Lack of Transportation (Non-Medical): No  Physical Activity: Sufficiently Active (10/09/2023)   Exercise Vital Sign    Days of Exercise per Week: 7 days    Minutes of Exercise per Session: 30 min  Stress: No Stress Concern Present (10/09/2023)   [employer] of Occupational Health - Occupational Stress Questionnaire    Feeling of Stress : Only a little  Social Connections: Moderately Isolated (10/09/2023)   Social Connection and Isolation Panel    Frequency of Communication with Friends and Family: Once a week    Frequency of Social Gatherings with Friends and Family: Never    Attends Religious Services: More than 4 times per year    Active Member of [employer] or Organizations: No    Attends Banker Meetings: Never    Marital Status: Married   Outpatient Encounter Medications as of 01/02/2024  Medication Sig   Accu-Chek FastClix Lancets MISC Use to monitor glucose twice  daily   ALPRAZolam  (XANAX ) 1 MG tablet Take 1 tablet (1 mg total) by mouth 2 (two) times daily.   ARIPiprazole  (ABILIFY ) 2 MG tablet Take 1 tablet (2 mg total) by mouth daily.   aspirin  EC 81 MG tablet Take 81 mg by mouth daily as needed. Swallow whole.   Blood Glucose Monitoring Suppl (ACCU-CHEK GUIDE ME) w/Device KIT 1 Device by Does not apply route 4 (four) times daily.   Cholecalciferol (VITAMIN D3) 1.25 MG (50000 UT) CAPS Take by mouth.   FLUoxetine  (PROZAC ) 40 MG capsule Take 1 capsule (40 mg total) by mouth daily.   glucose blood (ACCU-CHEK GUIDE) test strip Use as instructed   Insulin  Pen Needle (B-D ULTRAFINE III SHORT PEN) 31G X 8 MM MISC Use to inject insulin  once daily    olmesartan -hydrochlorothiazide  (BENICAR  HCT) 20-12.5 MG tablet Take 1 tablet by mouth daily.   rosuvastatin  (CRESTOR ) 40 MG tablet Take 1 tablet by mouth once daily   traZODone  (DESYREL ) 150 MG tablet Take 1 tablet (150 mg total) by mouth at bedtime.   [DISCONTINUED] insulin  glargine (LANTUS  SOLOSTAR) 100 UNIT/ML Solostar Pen Inject 40 Units into the skin at bedtime.   [DISCONTINUED] metFORMIN  (GLUCOPHAGE ) 500 MG tablet Take 1 tablet (500 mg total) by mouth 2 (two) times daily with a meal.   insulin  glargine (LANTUS  SOLOSTAR) 100 UNIT/ML Solostar Pen Inject 40 Units into the skin at bedtime.   metFORMIN  (GLUCOPHAGE ) 500 MG tablet Take 1 tablet (500 mg total) by mouth 2 (two) times daily with a meal.   No facility-administered encounter medications on file as of 01/02/2024.   ALLERGIES: Allergies  Allergen Reactions   Januvia  [Sitagliptin ] Nausea And Vomiting   Poison Oak Extract [Poison Oak Extract] Dermatitis   VACCINATION STATUS: Immunization History  Administered Date(s) Administered   Influenza Whole 03/18/2010, 04/13/2011   Influenza,inj,Quad PF,6+ Mos 05/20/2013, 05/08/2014, 06/11/2015, 04/18/2017, 05/23/2018, 03/18/2019, 02/12/2020, 05/07/2021, 04/07/2022   Janssen (J&J) SARS-COV-2 Vaccination 11/02/2019   PNEUMOCOCCAL CONJUGATE-20 05/07/2021   Pfizer Covid-19 Vaccine Bivalent Booster 11yrs & up 06/21/2021   Pneumococcal Polysaccharide-23 11/26/2013, 03/18/2019   Td 09/15/2009, 01/09/2023   Zoster Recombinant(Shingrix) 06/21/2021, 01/09/2023    Diabetes He presents for his follow-up diabetic visit. He has type 2 diabetes mellitus. Onset time: He was diagnosed at age 48. His disease course has been improving. There are no hypoglycemic associated symptoms. Pertinent negatives for hypoglycemia include no headaches or seizures. Associated symptoms include fatigue. Pertinent negatives for diabetes include no blurred vision, no chest pain, no polydipsia, no polyuria and no visual  change. There are no hypoglycemic complications. Symptoms are improving. Diabetic complications include nephropathy. Risk factors for coronary artery disease include dyslipidemia, diabetes mellitus, hypertension, male sex, obesity and sedentary lifestyle. Current diabetic treatment includes insulin  injections and oral agent (monotherapy). He is compliant with treatment most of the time. His weight is fluctuating minimally. He is following a generally healthy (admits to drinking sweet tea at times) diet. When asked about meal planning, he reported none. He has not had a previous visit with a dietitian. He participates in exercise three times a week. His overall blood glucose range is >200 mg/dl. (He presents today with his meter showing inconsistent glucose monitoring.  He has CGM but ran out of sensors, waiting on a refill.  His POCT A1c today is 7.9%, improving from last visit of 10.9%.  He denies any s/s of hypoglycemia.  He admits he has some sweets every now and then.  He never filled out the PAP  form sent home with him last visit.) An ACE inhibitor/angiotensin II receptor blocker is being taken. He does not see a podiatrist.Eye exam is current.  Hyperlipidemia This is a chronic problem. The current episode started more than 1 year ago. The problem is uncontrolled. Recent lipid tests were reviewed and are high. Exacerbating diseases include chronic renal disease, diabetes and obesity. Factors aggravating his hyperlipidemia include fatty foods. Pertinent negatives include no chest pain, myalgias or shortness of breath. Current antihyperlipidemic treatment includes statins. The current treatment provides mild improvement of lipids. Compliance problems include adherence to diet and adherence to exercise.  Risk factors for coronary artery disease include diabetes mellitus, hypertension, male sex, obesity, a sedentary lifestyle and dyslipidemia.  Hypertension This is a chronic problem. The current episode started  more than 1 year ago. The problem has been gradually improving since onset. The problem is controlled. Pertinent negatives include no blurred vision, chest pain, headaches, palpitations or shortness of breath. There are no associated agents to hypertension. Risk factors for coronary artery disease include dyslipidemia, diabetes mellitus, male gender, obesity and sedentary lifestyle. Past treatments include ACE inhibitors, diuretics and calcium  channel blockers. The current treatment provides moderate improvement. Compliance problems include diet and exercise.  Hypertensive end-organ damage includes kidney disease. Identifiable causes of hypertension include chronic renal disease.    Review of systems  Constitutional: + increasing body weight,  current Body mass index is 28.92 kg/m. , no fatigue, no subjective hyperthermia, no subjective hypothermia Eyes: + blurry vision, no xerophthalmia ENT: no sore throat, no nodules palpated in throat, no dysphagia/odynophagia, no hoarseness Cardiovascular: no chest pain, no shortness of breath, no palpitations, no leg swelling Respiratory: no cough, no shortness of breath Gastrointestinal: no nausea/vomiting/diarrhea Musculoskeletal: no muscle/joint aches Skin: no rashes, no hyperemia Neurological: no tremors, no numbness, no tingling, no dizziness Psychiatric: no depression, no anxiety   Objective:    BP 130/80 (BP Location: Right Arm, Patient Position: Sitting, Cuff Size: Large)   Pulse 82   Ht 5' 8 (1.727 m)   Wt 190 lb 3.2 oz (86.3 kg)   BMI 28.92 kg/m   Wt Readings from Last 3 Encounters:  01/02/24 190 lb 3.2 oz (86.3 kg)  11/24/23 189 lb 1.9 oz (85.8 kg)  10/09/23 185 lb (83.9 kg)    BP Readings from Last 3 Encounters:  01/02/24 130/80  11/24/23 (!) 155/88  09/20/23 (!) 168/90     Physical Exam- Limited  Constitutional:  Body mass index is 28.92 kg/m. , not in acute distress, normal state of mind Eyes:  EOMI, no  exophthalmos Musculoskeletal: no gross deformities, strength intact in all four extremities, no gross restriction of joint movements Skin:  no rashes, no hyperemia Neurological: no tremor with outstretched hands   CMP     Component Value Date/Time   NA 137 09/20/2023 1407   K 4.5 09/20/2023 1407   CL 99 09/20/2023 1407   CO2 23 09/20/2023 1407   GLUCOSE 186 (H) 09/20/2023 1407   GLUCOSE 314 (H) 04/03/2023 1804   BUN 8 09/20/2023 1407   CREATININE 1.35 (H) 09/20/2023 1407   CREATININE 1.38 (H) 01/07/2020 0729   CALCIUM  9.7 09/20/2023 1407   PROT 7.0 09/20/2023 1407   ALBUMIN 4.3 09/20/2023 1407   AST 23 09/20/2023 1407   ALT 23 09/20/2023 1407   ALKPHOS 113 09/20/2023 1407   BILITOT 0.5 09/20/2023 1407   GFRNONAA 47 (L) 04/03/2023 1804   GFRNONAA 56 (L) 01/07/2020 0729   GFRAA 73 07/03/2020  1038   GFRAA 64 01/07/2020 0729    Lab Results  Component Value Date   HGBA1C 7.9 (A) 01/02/2024   HGBA1C 10.9 (H) 09/20/2023   HGBA1C 8.4 (A) 05/12/2023   MICROALBUR 13.8 03/18/2019   MICROALBUR 5.3 02/06/2018   MICROALBUR 134.8 (H) 12/30/2016    Lipid Panel     Component Value Date/Time   CHOL 123 09/20/2023 1407   TRIG 105 09/20/2023 1407   HDL 35 (L) 09/20/2023 1407   CHOLHDL 3.5 09/20/2023 1407   CHOLHDL 4.2 01/07/2020 0729   VLDL 64 (H) 12/30/2016 0950   LDLCALC 68 09/20/2023 1407   LDLCALC 99 01/07/2020 0729     Assessment & Plan:   1) Diabetes mellitus type 2 in obese [facility])  - Patient has currently uncontrolled symptomatic type 2 DM since  63 years of age.  He presents today with his CGM showing above target glycemic profile overall.  His POCT A1c today is 8.4%, increasing from last visit of 6.6%.  Analysis of his CGM shows TIR 32%, TAR 68%, TBR 0% with a GMI of 8.8%.  He notes he has been battling depression, is getting treatment for it.  He denies any hypoglycemia.  He did admit to starting drinking sodas once again.   His diabetes is complicated by obesity ,  prior history of noncompliance/nonadherence, and patient remains at a high risk for more acute and chronic complications of diabetes which include CAD, CVA, CKD, retinopathy, and neuropathy. These are all discussed in detail with the patient.  - Nutritional counseling repeated at each appointment due to patients tendency to fall back in to old habits.  - The patient admits there is a room for improvement in their diet and drink choices. -  Suggestion is made for the patient to avoid simple carbohydrates from their diet including Cakes, Sweet Desserts / Pastries, Ice Cream, Soda (diet and regular), Sweet Tea, Candies, Chips, Cookies, Sweet Pastries, Store Bought Juices, Alcohol in Excess of 1-2 drinks a day, Artificial Sweeteners, Coffee Creamer, and Sugar-free Products. This will help patient to have stable blood glucose profile and potentially avoid unintended weight gain.   - I encouraged the patient to switch to unprocessed or minimally processed complex starch and increased protein intake (animal or plant source), fruits, and vegetables.   - Patient is advised to stick to a routine mealtimes to eat 3 meals a day and avoid unnecessary snacks (to snack only to correct hypoglycemia).  - I have approached patient with the following individualized plan to manage diabetes and patient agrees:   Due to lack of logs, for safety purposes, no changes will be made to his medications today.  He can continue Lantus  40 units SQ nightly and continue Metforimin 500 mg po twice daily with meals.     -He is strongly encouraged to monitor glucose at least twice daily (using his CGM), before breakfast and before bed, and call the clinic if he has readings less than 70 or greater than 200 for 3 tests in a row.  He is aware that his insulin  may need further adjustment to avoid hypoglycemia.  He is great candidate for CGM due to hypoglycemia and visual impairment, is advised to continue wearing.  I sent refill request  to [employer] for fulfillment.  - Patient will be considered for incretin therapy as appropriate next visit.  2) BP/HTN: His blood pressure is controlled to target.  He is advised to continue Lisinopril -HCT 20-25 mg po daily.   3) Lipids/HPL:  His most recent lipid panel from 09/20/23 shows controlled LDL at 68.  He is advised to continue Crestor  40 mg po daily at bedtime.  Side effects and precautions discussed with him.  4) Chronic Care/Health Maintenance: -Patient is on ACEI/ARB and Statin medications and encouraged to continue to follow up with Ophthalmology, Podiatrist at least yearly or according to recommendations, and advised to stay away from smoking. I have recommended yearly flu vaccine and pneumonia vaccination at least every 5 years; moderate intensity exercise for up to 150 minutes weekly; and  sleep for at least 7 hours a day.  - I advised patient to maintain close follow up with Antonetta Rollene BRAVO, MD for primary care needs.      I spent  25  minutes in the care of the patient today including review of labs from CMP, Lipids, Thyroid  Function, Hematology (current and previous including abstractions from other facilities); face-to-face time discussing  his blood glucose readings/logs, discussing hypoglycemia and hyperglycemia episodes and symptoms, medications doses, his options of short and long term treatment based on the latest standards of care / guidelines;  discussion about incorporating lifestyle medicine;  and documenting the encounter. Risk reduction counseling performed per USPSTF guidelines to reduce obesity and cardiovascular risk factors.     Please refer to Patient Instructions for Blood Glucose Monitoring and Insulin /Medications Dosing Guide  in media tab for additional information. Please  also refer to  Patient Self Inventory in the Media  tab for reviewed elements of pertinent patient history.  Arley MARLA Gearing participated in the discussions, expressed  understanding, and voiced agreement with the above plans.  All questions were answered to his satisfaction. he is encouraged to contact clinic should he have any questions or concerns prior to his return visit.    Follow up plan: - Return in about 4 months (around 05/04/2024) for Diabetes F/U with A1c in office, No previsit labs, Bring meter and logs.   [place], [facility] [address] [place], [place] [address] Phone: [phone] Fax: [phone]  01/02/2024, 3:11 PM

## 2024-01-05 ENCOUNTER — Encounter: Payer: Self-pay | Admitting: Family Medicine

## 2024-01-05 ENCOUNTER — Ambulatory Visit: Admitting: Family Medicine

## 2024-01-05 VITALS — BP 148/80 | HR 86 | Resp 16 | Ht 68.0 in | Wt 189.1 lb

## 2024-01-05 DIAGNOSIS — I10 Essential (primary) hypertension: Secondary | ICD-10-CM

## 2024-01-05 DIAGNOSIS — F322 Major depressive disorder, single episode, severe without psychotic features: Secondary | ICD-10-CM | POA: Diagnosis not present

## 2024-01-05 DIAGNOSIS — E1165 Type 2 diabetes mellitus with hyperglycemia: Secondary | ICD-10-CM

## 2024-01-05 DIAGNOSIS — E782 Mixed hyperlipidemia: Secondary | ICD-10-CM | POA: Diagnosis not present

## 2024-01-05 DIAGNOSIS — E663 Overweight: Secondary | ICD-10-CM

## 2024-01-05 MED ORDER — OLMESARTAN MEDOXOMIL-HCTZ 40-12.5 MG PO TABS: 1.0000 | ORAL_TABLET | Freq: Every day | ORAL | 3 refills | Status: DC

## 2024-01-05 NOTE — Patient Instructions (Addendum)
 Nurse BP check in 5 weeks,   F/U with MD end November  New higher dose BP medications sent in to start today,   Nurse pls sen message to pharmacy / check states pays $18 per month for BP medication, is there alower cost alternative  Thanks for choosing [facility], we consider it a privelige to serve you. Resume fluoxetine , need that  Make appt with Dr Okey pls  Thanks for choosing [facility], we consider it a privelige to serve you.

## 2024-01-07 NOTE — Assessment & Plan Note (Signed)
 Advise resume fluoxetine  as prescribed and secure f/u with Psych

## 2024-01-07 NOTE — Progress Notes (Signed)
 John [name]     MRN: [id number]      DOB: [birth year]  Chief Complaint  Patient presents with   Medical Management of Chronic Issues    6 week follow up     HPI John [name] is here for follow up and re-evaluation of chronic medical conditions, medication management and review of any available recent lab and radiology data.  Preventive health is updated, specifically  Cancer screening and Immunization.   Questions or concerns regarding consultations or procedures which the PT has had in the interim are  addressed. The PT denies any adverse reactions to current medications since the last visit. Stopped fluoxetine  as was told not good for John [name], increased withdrawal and staying in bed Blood sugar improved  Denies polyuria, polydipsia, blurred vision , or hypoglycemic episodes.  ROS Denies recent fever or chills. Denies sinus pressure, nasal congestion, ear pain or sore throat. Denies chest congestion, productive cough or wheezing. Denies chest pains, palpitations and leg swelling Denies abdominal pain, nausea, vomiting,diarrhea or constipation.   Denies dysuria, frequency, hesitancy or incontinence. Denies joint pain, swelling and limitation in mobility. Denies headaches, seizures, numbness, or tingling.  Denies skin break down or rash.   PE  BP (!) 148/80   Pulse 86   Resp 16   Ht 5' 8 (1.727 m)   Wt 189 lb 1.9 oz (85.8 kg)   SpO2 96%   BMI 28.76 kg/m   Patient alert and oriented and in no cardiopulmonary distress.  HEENT: No facial asymmetry, EOMI,     Neck supple .  Chest: Clear to auscultation bilaterally.  CVS: S1, S2 no murmurs, no S3.Regular rate.  ABD: Soft non tender.   Ext: No edema  MS: Adequate ROM spine, shoulders, hips and knees.  Skin: Intact, no ulcerations or rash noted.  Psych: Good eye contact, normal affect. Memory intact not anxious or depressed appearing.  CNS: CN 2-12 intact, power,  normal throughout.no focal deficits  noted.   Assessment & Plan  Essential hypertension Uncontrolled ,med increase , Nurse BP in 6 weeks DASH diet and commitment to daily physical activity for a minimum of 30 minutes discussed and encouraged, as a part of hypertension management. The importance of attaining a healthy weight is also discussed.     01/05/2024    9:30 AM 01/05/2024    9:08 AM 01/02/2024    2:44 PM 11/24/2023    1:21 PM 11/24/2023   12:59 PM 10/09/2023    8:47 AM 09/20/2023    1:38 PM  BP/Weight  Systolic BP 148 146 130 155 161 -- 168  Diastolic BP 80 81 80 88 91 -- 90  Wt. (Lbs)  189.12 190.2  189.12 185   BMI  28.76 kg/m2 28.92 kg/m2  28.76 kg/m2 28.13 kg/m2        Severe major depression without psychotic features (HCC) Advise resume fluoxetine  as prescribed and secure f/u with Psych  Type 2 diabetes mellitus with hyperglycemia (HCC) Improved,  managed by Endo, still nopt at goal, mental health needs to improve andmay benefit from dose inc in metformin  Diabetes associated with hypertension, hyperlipidemia, and depression  John [name] is reminded of the importance of commitment to daily physical activity for 30 minutes or more, as able and the need to limit carbohydrate intake to 30 to 60 grams per meal to help with blood sugar control.   The need to take medication as prescribed, test blood sugar as directed, and to call between visits if  there is a concern that blood sugar is uncontrolled is also discussed.   John [name] is reminded of the importance of daily foot exam, annual eye examination, and good blood sugar, blood pressure and cholesterol control.     Latest Ref Rng & Units 01/02/2024    3:03 PM 09/20/2023    2:07 PM 05/12/2023   11:42 AM 04/03/2023    6:04 PM 01/17/2023    8:30 AM  Diabetic Labs  HbA1c 4.0 - 5.6 % 7.9  10.9  8.4   6.6   Chol 100 - 199 mg/dL  876      HDL >60 mg/dL  35      Calc LDL 0 - 99 mg/dL  68      Triglycerides 0 - 149 mg/dL  894      Creatinine 9.23 - 1.27 mg/dL   8.64   8.35        2/81/7974    9:30 AM 01/05/2024    9:08 AM 01/02/2024    2:44 PM 11/24/2023    1:21 PM 11/24/2023   12:59 PM 10/09/2023    8:47 AM 09/20/2023    1:38 PM  BP/Weight  Systolic BP 148 146 130 155 161 -- 168  Diastolic BP 80 81 80 88 91 -- 90  Wt. (Lbs)  189.12 190.2  189.12 185   BMI  28.76 kg/m2 28.92 kg/m2  28.76 kg/m2 28.13 kg/m2       Latest Ref Rng & Units 09/20/2023    1:00 PM 06/17/2021   12:00 AM  Foot/eye exam completion dates  Eye Exam No Retinopathy  No Retinopathy      Foot Form Completion  Done      This result is from an external source.        Mixed hyperlipidemia Hyperlipidemia:Low fat diet discussed and encouraged.   Lipid Panel  Lab Results  Component Value Date   CHOL 123 09/20/2023   HDL 35 (L) 09/20/2023   LDLCALC 68 09/20/2023   TRIG 105 09/20/2023   CHOLHDL 3.5 09/20/2023     Controlled, no change in medication   Overweight (BMI 25.0-29.9)  Patient re-educated about  the importance of commitment to a  minimum of 150 minutes of exercise per week as able.  The importance of healthy food choices with portion control discussed, as well as eating regularly and within a 12 hour window most days. The need to choose clean , green food 50 to 75% of the time is discussed, as well as to make water  the primary drink and set a goal of 64 ounces water  daily.       01/05/2024    9:08 AM 01/02/2024    2:44 PM 11/24/2023   12:59 PM  Weight /BMI  Weight 189 lb 1.9 oz 190 lb 3.2 oz 189 lb 1.9 oz  Height 5' 8 (1.727 m) 5' 8 (1.727 m) 5' 8 (1.727 m)  BMI 28.76 kg/m2 28.92 kg/m2 28.76 kg/m2    Unchanged, needs to commit to regular exercise

## 2024-01-07 NOTE — Assessment & Plan Note (Signed)
 Uncontrolled ,med increase , Nurse BP in 6 weeks DASH diet and commitment to daily physical activity for a minimum of 30 minutes discussed and encouraged, as a part of hypertension management. The importance of attaining a healthy weight is also discussed.     01/05/2024    9:30 AM 01/05/2024    9:08 AM 01/02/2024    2:44 PM 11/24/2023    1:21 PM 11/24/2023   12:59 PM 10/09/2023    8:47 AM 09/20/2023    1:38 PM  BP/Weight  Systolic BP 148 146 130 155 161 -- 168  Diastolic BP 80 81 80 88 91 -- 90  Wt. (Lbs)  189.12 190.2  189.12 185   BMI  28.76 kg/m2 28.92 kg/m2  28.76 kg/m2 28.13 kg/m2

## 2024-01-07 NOTE — Assessment & Plan Note (Signed)
 Hyperlipidemia:Low fat diet discussed and encouraged.   Lipid Panel  Lab Results  Component Value Date   CHOL 123 09/20/2023   HDL 35 (L) 09/20/2023   LDLCALC 68 09/20/2023   TRIG 105 09/20/2023   CHOLHDL 3.5 09/20/2023     Controlled, no change in medication

## 2024-01-07 NOTE — Assessment & Plan Note (Addendum)
 Improved,  managed by Endo, still nopt at goal, mental health needs to improve andmay benefit from dose inc in metformin  Diabetes associated with hypertension, hyperlipidemia, and depression  John [name] is reminded of the importance of commitment to daily physical activity for 30 minutes or more, as able and the need to limit carbohydrate intake to 30 to 60 grams per meal to help with blood sugar control.   The need to take medication as prescribed, test blood sugar as directed, and to call between visits if there is a concern that blood sugar is uncontrolled is also discussed.   John [name] is reminded of the importance of daily foot exam, annual eye examination, and good blood sugar, blood pressure and cholesterol control.     Latest Ref Rng & Units 01/02/2024    3:03 PM 09/20/2023    2:07 PM 05/12/2023   11:42 AM 04/03/2023    6:04 PM 01/17/2023    8:30 AM  Diabetic Labs  HbA1c 4.0 - 5.6 % 7.9  10.9  8.4   6.6   Chol 100 - 199 mg/dL  876      HDL >60 mg/dL  35      Calc LDL 0 - 99 mg/dL  68      Triglycerides 0 - 149 mg/dL  894      Creatinine 9.23 - 1.27 mg/dL  8.64   8.35        2/81/7974    9:30 AM 01/05/2024    9:08 AM 01/02/2024    2:44 PM 11/24/2023    1:21 PM 11/24/2023   12:59 PM 10/09/2023    8:47 AM 09/20/2023    1:38 PM  BP/Weight  Systolic BP 148 146 130 155 161 -- 168  Diastolic BP 80 81 80 88 91 -- 90  Wt. (Lbs)  189.12 190.2  189.12 185   BMI  28.76 kg/m2 28.92 kg/m2  28.76 kg/m2 28.13 kg/m2       Latest Ref Rng & Units 09/20/2023    1:00 PM 06/17/2021   12:00 AM  Foot/eye exam completion dates  Eye Exam No Retinopathy  No Retinopathy      Foot Form Completion  Done      This result is from an external source.

## 2024-01-07 NOTE — Assessment & Plan Note (Signed)
  Patient re-educated about  the importance of commitment to a  minimum of 150 minutes of exercise per week as able.  The importance of healthy food choices with portion control discussed, as well as eating regularly and within a 12 hour window most days. The need to choose clean , green food 50 to 75% of the time is discussed, as well as to make water  the primary drink and set a goal of 64 ounces water  daily.       01/05/2024    9:08 AM 01/02/2024    2:44 PM 11/24/2023   12:59 PM  Weight /BMI  Weight 189 lb 1.9 oz 190 lb 3.2 oz 189 lb 1.9 oz  Height 5' 8 (1.727 m) 5' 8 (1.727 m) 5' 8 (1.727 m)  BMI 28.76 kg/m2 28.92 kg/m2 28.76 kg/m2    Unchanged, needs to commit to regular exercise

## 2024-01-08 ENCOUNTER — Other Ambulatory Visit: Payer: Self-pay | Admitting: Family Medicine

## 2024-01-10 DIAGNOSIS — E1165 Type 2 diabetes mellitus with hyperglycemia: Secondary | ICD-10-CM | POA: Diagnosis not present

## 2024-02-09 ENCOUNTER — Ambulatory Visit (INDEPENDENT_AMBULATORY_CARE_PROVIDER_SITE_OTHER)

## 2024-02-09 VITALS — BP 188/85

## 2024-02-09 DIAGNOSIS — I10 Essential (primary) hypertension: Secondary | ICD-10-CM | POA: Diagnosis not present

## 2024-02-09 NOTE — Progress Notes (Signed)
 Patient is in office today for a nurse visit for Blood Pressure Check. Patient blood pressure was 184/83 then 188/85, Patient No chest pain

## 2024-02-09 NOTE — Progress Notes (Signed)
 Blod pressure worse despite recent increaser in bP meds supposedly. Needs in officer visit with any Provider in next 2 to 5 weeks with his medications with him

## 2024-02-29 ENCOUNTER — Other Ambulatory Visit (HOSPITAL_COMMUNITY): Payer: Self-pay | Admitting: Psychiatry

## 2024-02-29 DIAGNOSIS — F322 Major depressive disorder, single episode, severe without psychotic features: Secondary | ICD-10-CM

## 2024-02-29 NOTE — Telephone Encounter (Signed)
 Call for appt

## 2024-03-13 ENCOUNTER — Ambulatory Visit (INDEPENDENT_AMBULATORY_CARE_PROVIDER_SITE_OTHER): Admitting: Internal Medicine

## 2024-03-13 ENCOUNTER — Encounter: Payer: Self-pay | Admitting: Internal Medicine

## 2024-03-13 VITALS — BP 128/74 | HR 74 | Ht 68.0 in | Wt 194.6 lb

## 2024-03-13 DIAGNOSIS — Z7984 Long term (current) use of oral hypoglycemic drugs: Secondary | ICD-10-CM | POA: Diagnosis not present

## 2024-03-13 DIAGNOSIS — I10 Essential (primary) hypertension: Secondary | ICD-10-CM | POA: Diagnosis not present

## 2024-03-13 DIAGNOSIS — E1165 Type 2 diabetes mellitus with hyperglycemia: Secondary | ICD-10-CM | POA: Diagnosis not present

## 2024-03-13 DIAGNOSIS — R7989 Other specified abnormal findings of blood chemistry: Secondary | ICD-10-CM

## 2024-03-13 NOTE — Patient Instructions (Signed)
Please continue to take medications as prescribed.  Please continue to follow low carb diet and perform moderate exercise/walking at least 150 mins/week.  Please get blood tests done before the next visit.

## 2024-03-15 DIAGNOSIS — R7989 Other specified abnormal findings of blood chemistry: Secondary | ICD-10-CM | POA: Insufficient documentation

## 2024-03-15 NOTE — Assessment & Plan Note (Signed)
 Last BMP reviewed, GFR 59 On ARB and HCTZ Advised to maintain adequate hydration Avoid nephrotoxic agents Check CBC and CMP

## 2024-03-15 NOTE — Progress Notes (Signed)
 Acute Office Visit  Subjective:    Patient ID: John [name], male    DOB: [birth year], 63 y.o.   MRN: [id number]  Chief Complaint  Patient presents with   Hypertension    BP check , elevated readings.    HPI Patient is in today for complaint of elevated BP readings at home.  He reports that his BP has been around 140/80 at home on some days.  His BP was 132/76 and later 128/74 during office visit.  He takes Benicar  hydrochlorothiazide  40-12.5 mg QD currently.  Denies any headache, dizziness, chest pain, dyspnea or palpitations.  His last BMP showed GFR of 59.  Denies dysuria, hematuria, urinary hesitancy or resistance.  Past Medical History:  Diagnosis Date   Anxiety    Depression 2007   Diabetes mellitus without complication (HCC)    Helicobacter pylori ab+ 01/21/2019   Dx in 12/2018, will treat   Hyperlipidemia    Hypertension    Hypogonadism male    Obesity (BMI 30.0-34.9) 03/01/2017    Past Surgical History:  Procedure Laterality Date   CIRCUMCISION N/A 06/10/2019   Procedure: CIRCUMCISION ADULT;  Surgeon: Sherrilee Belvie CROME, MD;  Location: [facility];  Service: Urology;  Laterality: N/A;  30 MINS   COLONOSCOPY  01/20/2011   Dr. Harvey: large internal hemorrhoids   COLONOSCOPY WITH PROPOFOL  N/A 09/21/2020   Procedure: COLONOSCOPY WITH PROPOFOL ;  Surgeon: Cindie Carlin MARLA, DO;  Location: [facility];  Service: Endoscopy;  Laterality: N/A;  AM-diabetic   HERNIA REPAIR     ventral hernia repair    VASECTOMY      Family History  Problem Relation Age of Onset   Heart failure Mother        living    Hypertension Mother    Hyperlipidemia Mother    Heart disease Mother    Hypertension Sister    Hypertension Brother    Colon cancer Neg Hx     Social History   Socioeconomic History   Marital status: Married    Spouse name: Not on file   Number of children: 1   Years of education: Not on file   Highest education level: Not on file  Occupational History    Occupation: Dispensing optician  Tobacco Use   Smoking status: Never   Smokeless tobacco: Never  Vaping Use   Vaping status: Never Used  Substance and Sexual Activity   Alcohol use: Yes    Alcohol/week: 1.0 standard drink of alcohol    Types: 1 Cans of beer per week    Comment: occasional beer socially.    Drug use: No   Sexual activity: Yes  Other Topics Concern   Not on file  Social History Narrative   Not on file   Social Drivers of Health   Financial Resource Strain: Low Risk  (10/09/2023)   Overall Financial Resource Strain (CARDIA)    Difficulty of Paying Living Expenses: Not hard at all  Food Insecurity: No Food Insecurity (10/09/2023)   Hunger Vital Sign    Worried About Running Out of Food in the Last Year: Never true    Ran Out of Food in the Last Year: Never true  Transportation Needs: No Transportation Needs (10/09/2023)   PRAPARE - Administrator, Civil Service (Medical): No    Lack of Transportation (Non-Medical): No  Physical Activity: Sufficiently Active (10/09/2023)   Exercise Vital Sign    Days of Exercise per Week: 7 days  Minutes of Exercise per Session: 30 min  Stress: No Stress Concern Present (10/09/2023)   [employer] of Occupational Health - Occupational Stress Questionnaire    Feeling of Stress : Only a little  Social Connections: Moderately Isolated (10/09/2023)   Social Connection and Isolation Panel    Frequency of Communication with Friends and Family: Once a week    Frequency of Social Gatherings with Friends and Family: Never    Attends Religious Services: More than 4 times per year    Active Member of [employer] or Organizations: No    Attends Banker Meetings: Never    Marital Status: Married  Catering manager Violence: Not At Risk (10/09/2023)   Humiliation, Afraid, Rape, and Kick questionnaire    Fear of Current or Ex-Partner: No    Emotionally Abused: No    Physically Abused: No    Sexually Abused: No     Outpatient Medications Prior to Visit  Medication Sig Dispense Refill   Accu-Chek FastClix Lancets MISC Use to monitor glucose twice daily 102 each 6   ALPRAZolam  (XANAX ) 1 MG tablet Take 1 tablet by mouth twice daily 60 tablet 0   ARIPiprazole  (ABILIFY ) 2 MG tablet Take 1 tablet (2 mg total) by mouth daily. 30 tablet 2   aspirin  EC 81 MG tablet Take 81 mg by mouth daily as needed. Swallow whole.     Blood Glucose Monitoring Suppl (ACCU-CHEK GUIDE ME) w/Device KIT 1 Device by Does not apply route 4 (four) times daily. 1 kit 0   Cholecalciferol (VITAMIN D3) 1.25 MG (50000 UT) CAPS Take by mouth.     FLUoxetine  (PROZAC ) 40 MG capsule Take 1 capsule (40 mg total) by mouth daily. 30 capsule 2   glucose blood (ACCU-CHEK GUIDE) test strip Use as instructed 100 each 12   insulin  glargine (LANTUS  SOLOSTAR) 100 UNIT/ML Solostar Pen Inject 40 Units into the skin at bedtime. 36 mL 3   Insulin  Pen Needle (B-D ULTRAFINE III SHORT PEN) 31G X 8 MM MISC Use to inject insulin  once daily 100 each 3   metFORMIN  (GLUCOPHAGE ) 500 MG tablet Take 1 tablet (500 mg total) by mouth 2 (two) times daily with a meal. 180 tablet 3   olmesartan -hydrochlorothiazide  (BENICAR  HCT) 40-12.5 MG tablet Take 1 tablet by mouth daily. 30 tablet 3   rosuvastatin  (CRESTOR ) 40 MG tablet Take 1 tablet by mouth once daily 90 tablet 0   traZODone  (DESYREL ) 150 MG tablet Take 1 tablet (150 mg total) by mouth at bedtime. 30 tablet 2   No facility-administered medications prior to visit.    Allergies  Allergen Reactions   Januvia  [Sitagliptin ] Nausea And Vomiting   Poison Oak Extract [Poison Oak Extract] Dermatitis    Review of Systems  Constitutional:  Negative for chills and fever.  HENT:  Negative for congestion and sore throat.   Eyes:  Negative for pain and discharge.  Respiratory:  Negative for cough and shortness of breath.   Cardiovascular:  Negative for chest pain and palpitations.  Gastrointestinal:  Negative for  diarrhea, nausea and vomiting.  Endocrine: Negative for polydipsia and polyuria.  Genitourinary:  Negative for dysuria and hematuria.  Musculoskeletal:  Negative for neck pain and neck stiffness.  Skin:  Negative for rash.  Neurological:  Negative for dizziness, weakness, numbness and headaches.  Psychiatric/Behavioral:  Negative for agitation and behavioral problems.        Objective:    Physical Exam Vitals reviewed.  Constitutional:      General:  He is not in acute distress.    Appearance: He is not diaphoretic.  HENT:     Head: Normocephalic and atraumatic.     Nose: Nose normal.     Mouth/Throat:     Mouth: Mucous membranes are moist.  Eyes:     General: No scleral icterus.    Extraocular Movements: Extraocular movements intact.  Cardiovascular:     Rate and Rhythm: Normal rate and regular rhythm.     Heart sounds: Normal heart sounds. No murmur heard. Pulmonary:     Breath sounds: Normal breath sounds. No wheezing or rales.  Musculoskeletal:     Cervical back: Neck supple. No tenderness.     Right lower leg: No edema.     Left lower leg: No edema.  Skin:    General: Skin is warm.     Findings: No rash.  Neurological:     General: No focal deficit present.     Mental Status: He is alert and oriented to person, place, and time.  Psychiatric:        Mood and Affect: Mood normal.        Behavior: Behavior normal.     BP 128/74 (BP Location: Left Arm)   Pulse 74   Ht 5' 8 (1.727 m)   Wt 194 lb 9.6 oz (88.3 kg)   SpO2 98%   BMI 29.59 kg/m  Wt Readings from Last 3 Encounters:  03/13/24 194 lb 9.6 oz (88.3 kg)  01/05/24 189 lb 1.9 oz (85.8 kg)  01/02/24 190 lb 3.2 oz (86.3 kg)        Assessment & Plan:   Problem List Items Addressed This Visit       Cardiovascular and Mediastinum   Essential hypertension (Chronic)   BP Readings from Last 1 Encounters:  03/13/24 128/74   Well-controlled with Benicar  HCTZ 40-12.5 mg QD Advised to check BP at home  and bring the log in the next visit He can bring his BP device to the office visit and we can compare it with manual readings for the accuracy Counseled for compliance with the medications Advised DASH diet and moderate exercise/walking, at least 150 mins/week      Relevant Orders   CBC with Differential/Platelet   CMP14+EGFR     Endocrine   Type 2 diabetes mellitus with hyperglycemia (HCC) - Primary (Chronic)   Lab Results  Component Value Date   HGBA1C 7.9 (A) 01/02/2024   Uncontrolled, but improving On metformin  500 mg twice daily Advised to follow diabetic diet On statin and ARB        Other   Elevated serum creatinine   Last BMP reviewed, GFR 59 On ARB and HCTZ Advised to maintain adequate hydration Avoid nephrotoxic agents Check CBC and CMP      Relevant Orders   CMP14+EGFR     No orders of the defined types were placed in this encounter.    Suzzane MARLA Blanch, MD

## 2024-03-15 NOTE — Assessment & Plan Note (Signed)
 BP Readings from Last 1 Encounters:  03/13/24 128/74   Well-controlled with Benicar  HCTZ 40-12.5 mg QD Advised to check BP at home and bring the log in the next visit He can bring his BP device to the office visit and we can compare it with manual readings for the accuracy Counseled for compliance with the medications Advised DASH diet and moderate exercise/walking, at least 150 mins/week

## 2024-03-15 NOTE — Assessment & Plan Note (Signed)
 Lab Results  Component Value Date   HGBA1C 7.9 (A) 01/02/2024   Uncontrolled, but improving On metformin  500 mg twice daily Advised to follow diabetic diet On statin and ARB

## 2024-03-25 ENCOUNTER — Telehealth (INDEPENDENT_AMBULATORY_CARE_PROVIDER_SITE_OTHER): Admitting: Psychiatry

## 2024-03-25 ENCOUNTER — Encounter (HOSPITAL_COMMUNITY): Payer: Self-pay | Admitting: Psychiatry

## 2024-03-25 DIAGNOSIS — F322 Major depressive disorder, single episode, severe without psychotic features: Secondary | ICD-10-CM | POA: Diagnosis not present

## 2024-03-25 MED ORDER — ALPRAZOLAM 1 MG PO TABS: 1.0000 mg | ORAL_TABLET | Freq: Two times a day (BID) | ORAL | 2 refills | Status: DC

## 2024-03-25 MED ORDER — BUPROPION HCL ER (XL) 150 MG PO TB24: 150.0000 mg | ORAL_TABLET | ORAL | 2 refills | Status: DC

## 2024-03-25 MED ORDER — TRAZODONE HCL 150 MG PO TABS: 150.0000 mg | ORAL_TABLET | Freq: Every day | ORAL | 2 refills | Status: DC

## 2024-03-25 NOTE — Progress Notes (Signed)
 Virtual Visit via Telephone Note  I connected with John [name] on 03/25/24 at  9:20 AM EDT by telephone and verified that I am speaking with the correct person using two identifiers.  Location: Patient: home Provider: office   I discussed the limitations, risks, security and privacy concerns of performing an evaluation and management service by telephone and the availability of in person appointments. I also discussed with the patient that there may be a patient responsible charge related to this service. The patient expressed understanding and agreed to proceed.     I discussed the assessment and treatment plan with the patient. The patient was provided an opportunity to ask questions and all were answered. The patient agreed with the plan and demonstrated an understanding of the instructions.   The patient was advised to call back or seek an in-person evaluation if the symptoms worsen or if the condition fails to improve as anticipated.  I provided 20 minutes of non-face-to-face time during this encounter.   Barnie Gull, MD  [facility] MD/PA/NP OP Progress Note  03/25/2024 9:44 AM John [name]  MRN:  [id number]  Chief Complaint:  Chief Complaint  Patient presents with   Depression   Follow-up   Anxiety   HPI: This patient is a 63 year old black male who is living with his wife in [place]. He has 1 son who died at age 63 in 26. He is on disability.   The patient returns for follow-up after about 6 months regarding his depression and anxiety.  He states that his depression has gotten worse in the interim.  He does not feel like doing anything and just lays in bed most of the time.  He cannot give any precipitant for this change.  He states that his wife also has depression and perhaps this has brought him down some.  He is not enjoying much right now and does not feel like picking up his hobbies like fishing or talking to his friends.  He denies any thoughts of self-harm or  suicide.  In the past I had increased his Prozac  as well as added Abilify  and it seemed to help for a while but no longer.  I suggested we stop both of these in favor of Wellbutrin XL and he is willing to try this.  The trazodone  helps him sleep to some degree and the Xanax  helps his anxiety. Visit Diagnosis:    ICD-10-CM   1. Major depressive disorder, single episode, severe without psychotic features (HCC)  F32.2 ALPRAZolam  (XANAX ) 1 MG tablet      Past Psychiatric History: none  Past Medical History:  Past Medical History:  Diagnosis Date   Anxiety    Depression 2007   Diabetes mellitus without complication (HCC)    Helicobacter pylori ab+ 01/21/2019   Dx in 12/2018, will treat   Hyperlipidemia    Hypertension    Hypogonadism male    Obesity (BMI 30.0-34.9) 03/01/2017    Past Surgical History:  Procedure Laterality Date   CIRCUMCISION N/A 06/10/2019   Procedure: CIRCUMCISION ADULT;  Surgeon: Sherrilee Belvie CROME, MD;  Location: [facility];  Service: Urology;  Laterality: N/A;  30 MINS   COLONOSCOPY  01/20/2011   Dr. Harvey: large internal hemorrhoids   COLONOSCOPY WITH PROPOFOL  N/A 09/21/2020   Procedure: COLONOSCOPY WITH PROPOFOL ;  Surgeon: Cindie Carlin MARLA, DO;  Location: [facility];  Service: Endoscopy;  Laterality: N/A;  AM-diabetic   HERNIA REPAIR     ventral hernia repair  VASECTOMY      Family Psychiatric History: See below  Family History:  Family History  Problem Relation Age of Onset   Heart failure Mother        living    Hypertension Mother    Hyperlipidemia Mother    Heart disease Mother    Hypertension Sister    Hypertension Brother    Colon cancer Neg Hx     Social History:  Social History   Socioeconomic History   Marital status: Married    Spouse name: Not on file   Number of children: 1   Years of education: Not on file   Highest education level: Not on file  Occupational History   Occupation: Dispensing optician  Tobacco Use   Smoking status:  Never   Smokeless tobacco: Never  Vaping Use   Vaping status: Never Used  Substance and Sexual Activity   Alcohol use: Yes    Alcohol/week: 1.0 standard drink of alcohol    Types: 1 Cans of beer per week    Comment: occasional beer socially.    Drug use: No   Sexual activity: Yes  Other Topics Concern   Not on file  Social History Narrative   Not on file   Social Drivers of Health   Financial Resource Strain: Low Risk  (10/09/2023)   Overall Financial Resource Strain (CARDIA)    Difficulty of Paying Living Expenses: Not hard at all  Food Insecurity: No Food Insecurity (10/09/2023)   Hunger Vital Sign    Worried About Running Out of Food in the Last Year: Never true    Ran Out of Food in the Last Year: Never true  Transportation Needs: No Transportation Needs (10/09/2023)   PRAPARE - Administrator, Civil Service (Medical): No    Lack of Transportation (Non-Medical): No  Physical Activity: Sufficiently Active (10/09/2023)   Exercise Vital Sign    Days of Exercise per Week: 7 days    Minutes of Exercise per Session: 30 min  Stress: No Stress Concern Present (10/09/2023)   [employer] of Occupational Health - Occupational Stress Questionnaire    Feeling of Stress : Only a little  Social Connections: Moderately Isolated (10/09/2023)   Social Connection and Isolation Panel    Frequency of Communication with Friends and Family: Once a week    Frequency of Social Gatherings with Friends and Family: Never    Attends Religious Services: More than 4 times per year    Active Member of [employer] or Organizations: No    Attends Banker Meetings: Never    Marital Status: Married    Allergies:  Allergies  Allergen Reactions   Januvia  [Sitagliptin ] Nausea And Vomiting   Poison Oak Extract [Poison Oak Extract] Dermatitis    Metabolic Disorder Labs: Lab Results  Component Value Date   HGBA1C 7.9 (A) 01/02/2024   MPG 114.02 06/10/2019   MPG 140 02/26/2019    No results found for: PROLACTIN Lab Results  Component Value Date   CHOL 123 09/20/2023   TRIG 105 09/20/2023   HDL 35 (L) 09/20/2023   CHOLHDL 3.5 09/20/2023   VLDL 64 (H) 12/30/2016   LDLCALC 68 09/20/2023   LDLCALC 184 (H) 08/09/2022   Lab Results  Component Value Date   TSH 1.830 11/04/2021   TSH 1.850 07/03/2020    Therapeutic Level Labs: No results found for: LITHIUM No results found for: VALPROATE No results found for: CBMZ  Current Medications: Current Outpatient Medications  Medication Sig Dispense Refill   buPROPion (WELLBUTRIN XL) 150 MG 24 hr tablet Take 1 tablet (150 mg total) by mouth every morning. 30 tablet 2   Accu-Chek FastClix Lancets MISC Use to monitor glucose twice daily 102 each 6   ALPRAZolam  (XANAX ) 1 MG tablet Take 1 tablet (1 mg total) by mouth 2 (two) times daily. 60 tablet 2   aspirin  EC 81 MG tablet Take 81 mg by mouth daily as needed. Swallow whole.     Blood Glucose Monitoring Suppl (ACCU-CHEK GUIDE ME) w/Device KIT 1 Device by Does not apply route 4 (four) times daily. 1 kit 0   Cholecalciferol (VITAMIN D3) 1.25 MG (50000 UT) CAPS Take by mouth.     glucose blood (ACCU-CHEK GUIDE) test strip Use as instructed 100 each 12   insulin  glargine (LANTUS  SOLOSTAR) 100 UNIT/ML Solostar Pen Inject 40 Units into the skin at bedtime. 36 mL 3   Insulin  Pen Needle (B-D ULTRAFINE III SHORT PEN) 31G X 8 MM MISC Use to inject insulin  once daily 100 each 3   metFORMIN  (GLUCOPHAGE ) 500 MG tablet Take 1 tablet (500 mg total) by mouth 2 (two) times daily with a meal. 180 tablet 3   olmesartan -hydrochlorothiazide  (BENICAR  HCT) 40-12.5 MG tablet Take 1 tablet by mouth daily. 30 tablet 3   rosuvastatin  (CRESTOR ) 40 MG tablet Take 1 tablet by mouth once daily 90 tablet 0   traZODone  (DESYREL ) 150 MG tablet Take 1 tablet (150 mg total) by mouth at bedtime. 30 tablet 2   No current facility-administered medications for this visit.      Musculoskeletal: Strength & Muscle Tone: na Gait & Station: na Patient leans: N/A  Psychiatric Specialty Exam: Review of Systems  Constitutional:  Positive for fatigue.  Psychiatric/Behavioral:  Positive for dysphoric mood. The patient is nervous/anxious.   All other systems reviewed and are negative.   There were no vitals taken for this visit.There is no height or weight on file to calculate BMI.  General Appearance: NA  Eye Contact:  NA  Speech:  Clear and Coherent  Volume:  Normal  Mood:  Anxious and Depressed  Affect:  NA  Thought Process:  Goal Directed  Orientation:  Full (Time, Place, and Person)  Thought Content: Rumination   Suicidal Thoughts:  No  Homicidal Thoughts:  No  Memory:  Immediate;   Good Recent;   Fair Remote;   NA  Judgement:  Good  Insight:  Fair  Psychomotor Activity:  Decreased  Concentration:  Concentration: Fair and Attention Span: Fair  Recall:  [employer] of Knowledge: Fair  Language: Good  Akathisia:  No  Handed:  Right  AIMS (if indicated): not done  Assets:  Communication Skills Desire for Improvement Resilience Social Support  ADL's:  Intact  Cognition: Impaired,  Mild  Sleep:  Fair   Screenings: GAD-7    Flowsheet Row Office Visit from 03/13/2024 in [facility] Primary Care Office Visit from 01/05/2024 in [facility] Office Visit from 11/24/2023 in [facility] Office Visit from 09/20/2023 in [facility] Office Visit from 12/28/2022 in [facility]  Total GAD-7 Score 0 7 7 7 17    PHQ2-9    Flowsheet Row Office Visit from 03/13/2024 in [facility] Primary Care Office Visit from 01/05/2024 in [facility] Primary [facility] Office Visit from 11/24/2023 in [facility] Primary Care Clinical Support from 10/09/2023 in [facility]  Primary Care Office Visit from 09/20/2023 in [facility]  Primary Care  PHQ-2 Total Score 0 2 0 2 1  PHQ-9 Total Score 0 13 2 12  --   Flowsheet Row ED from 04/03/2023 in [facility] Emergency Department at [facility] ED from 10/25/2022 in [facility] Emergency Department at [facility] Video Visit from 03/15/2022 in [facility] [place]  C-SSRS RISK CATEGORY No Risk No Risk No Risk     Assessment and Plan: This patient is a 63 year old male with a history of depression anxiety and mild cognitive impairment.  He is not doing well in terms of his depression.  We will therefore discontinue Prozac  and Abilify  and start Wellbutrin XL 150 mg every morning.  He will continue Xanax  1 mg twice daily as needed for anxiety and trazodone  150 mg at bedtime for sleep.  He will return to see me in 4 weeks  Collaboration of Care: Collaboration of Care: Primary Care Provider AEB notes are shared with PCP on the epic system  Patient/Guardian was advised Release of Information must be obtained prior to any record release in order to collaborate their care with an outside provider. Patient/Guardian was advised if they have not already done so to contact the registration department to sign all necessary forms in order for us  to release information regarding their care.   Consent: Patient/Guardian gives verbal consent for treatment and assignment of benefits for services provided during this visit. Patient/Guardian expressed understanding and agreed to proceed.    Barnie Gull, MD 03/25/2024, 9:44 AM

## 2024-04-03 ENCOUNTER — Ambulatory Visit: Admitting: Nurse Practitioner

## 2024-04-03 DIAGNOSIS — Z794 Long term (current) use of insulin: Secondary | ICD-10-CM

## 2024-04-03 DIAGNOSIS — E782 Mixed hyperlipidemia: Secondary | ICD-10-CM

## 2024-04-03 DIAGNOSIS — Z7984 Long term (current) use of oral hypoglycemic drugs: Secondary | ICD-10-CM

## 2024-04-17 ENCOUNTER — Encounter: Payer: Self-pay | Admitting: Nurse Practitioner

## 2024-04-17 ENCOUNTER — Ambulatory Visit: Admitting: Nurse Practitioner

## 2024-04-17 VITALS — BP 126/76 | HR 73 | Ht 68.0 in | Wt 185.2 lb

## 2024-04-17 DIAGNOSIS — Z7984 Long term (current) use of oral hypoglycemic drugs: Secondary | ICD-10-CM

## 2024-04-17 DIAGNOSIS — E782 Mixed hyperlipidemia: Secondary | ICD-10-CM | POA: Diagnosis not present

## 2024-04-17 DIAGNOSIS — Z794 Long term (current) use of insulin: Secondary | ICD-10-CM

## 2024-04-17 DIAGNOSIS — E1165 Type 2 diabetes mellitus with hyperglycemia: Secondary | ICD-10-CM

## 2024-04-17 LAB — POCT GLYCOSYLATED HEMOGLOBIN (HGB A1C): Hemoglobin A1C: 9.7 % — AB (ref 4.0–5.6)

## 2024-04-17 MED ORDER — LANTUS SOLOSTAR 100 UNIT/ML ~~LOC~~ SOPN: 50.0000 [IU] | PEN_INJECTOR | Freq: Every day | SUBCUTANEOUS | 3 refills | Status: DC

## 2024-04-17 MED ORDER — METFORMIN HCL 500 MG PO TABS: 500.0000 mg | ORAL_TABLET | Freq: Two times a day (BID) | ORAL | 3 refills | Status: DC

## 2024-04-17 NOTE — Progress Notes (Signed)
 04/17/2024    Endocrinology Follow Up Visit    Subjective:    Patient ID: John [name], male    DOB: April 24, 1961. Patient is being engaged in follow-up for management of chronically uncontrolled type 2 diabetes, hyperlipidemia, hypertension. PMD:   Antonetta Rollene BRAVO, MD  Past Medical History:  Diagnosis Date   Anxiety    Depression 2007   Diabetes mellitus without complication (HCC)    Helicobacter pylori ab+ 01/21/2019   Dx in 12/2018, will treat   Hyperlipidemia    Hypertension    Hypogonadism male    Obesity (BMI 30.0-34.9) 03/01/2017   Past Surgical History:  Procedure Laterality Date   CIRCUMCISION N/A 06/10/2019   Procedure: CIRCUMCISION ADULT;  Surgeon: Sherrilee Belvie CROME, MD;  Location: [facility];  Service: Urology;  Laterality: N/A;  30 MINS   COLONOSCOPY  01/20/2011   Dr. Harvey: large internal hemorrhoids   COLONOSCOPY WITH PROPOFOL  N/A 09/21/2020   Procedure: COLONOSCOPY WITH PROPOFOL ;  Surgeon: Cindie Carlin MARLA, DO;  Location: [facility];  Service: Endoscopy;  Laterality: N/A;  AM-diabetic   HERNIA REPAIR     ventral hernia repair    VASECTOMY     Social History   Socioeconomic History   Marital status: Married    Spouse name: Not on file   Number of children: 1   Years of education: Not on file   Highest education level: Not on file  Occupational History   Occupation: dispensing optician  Tobacco Use   Smoking status: Never   Smokeless tobacco: Never  Vaping Use   Vaping status: Never Used  Substance and Sexual Activity   Alcohol use: Yes    Alcohol/week: 1.0 standard drink of alcohol    Types: 1 Cans of beer per week    Comment: occasional beer socially.    Drug use: No   Sexual activity: Yes  Other Topics Concern   Not on file  Social History Narrative   Not on file   Social Drivers of Health   Financial Resource Strain: Low Risk  (10/09/2023)   Overall Financial Resource Strain (CARDIA)    Difficulty of Paying Living Expenses: Not hard at  all  Food Insecurity: No Food Insecurity (10/09/2023)   Hunger Vital Sign    Worried About Running Out of Food in the Last Year: Never true    Ran Out of Food in the Last Year: Never true  Transportation Needs: No Transportation Needs (10/09/2023)   PRAPARE - Administrator, Civil Service (Medical): No    Lack of Transportation (Non-Medical): No  Physical Activity: Sufficiently Active (10/09/2023)   Exercise Vital Sign    Days of Exercise per Week: 7 days    Minutes of Exercise per Session: 30 min  Stress: No Stress Concern Present (10/09/2023)   [employer] of Occupational Health - Occupational Stress Questionnaire    Feeling of Stress : Only a little  Social Connections: Moderately Isolated (10/09/2023)   Social Connection and Isolation Panel    Frequency of Communication with Friends and Family: Once a week    Frequency of Social Gatherings with Friends and Family: Never    Attends Religious Services: More than 4 times per year    Active Member of [employer] or Organizations: No    Attends Banker Meetings: Never    Marital Status: Married   Outpatient Encounter Medications as of 04/17/2024  Medication Sig   Accu-Chek FastClix Lancets MISC Use to monitor glucose twice  daily   ALPRAZolam  (XANAX ) 1 MG tablet Take 1 tablet (1 mg total) by mouth 2 (two) times daily.   aspirin  EC 81 MG tablet Take 81 mg by mouth daily as needed. Swallow whole.   Blood Glucose Monitoring Suppl (ACCU-CHEK GUIDE ME) w/Device KIT 1 Device by Does not apply route 4 (four) times daily.   buPROPion (WELLBUTRIN XL) 150 MG 24 hr tablet Take 1 tablet (150 mg total) by mouth every morning.   Cholecalciferol (VITAMIN D3) 1.25 MG (50000 UT) CAPS Take by mouth.   glucose blood (ACCU-CHEK GUIDE) test strip Use as instructed   Insulin  Pen Needle (B-D ULTRAFINE III SHORT PEN) 31G X 8 MM MISC Use to inject insulin  once daily   olmesartan -hydrochlorothiazide  (BENICAR  HCT) 40-12.5 MG tablet Take 1  tablet by mouth daily.   rosuvastatin  (CRESTOR ) 40 MG tablet Take 1 tablet by mouth once daily   traZODone  (DESYREL ) 150 MG tablet Take 1 tablet (150 mg total) by mouth at bedtime.   [DISCONTINUED] insulin  glargine (LANTUS  SOLOSTAR) 100 UNIT/ML Solostar Pen Inject 40 Units into the skin at bedtime.   [DISCONTINUED] metFORMIN  (GLUCOPHAGE ) 500 MG tablet Take 1 tablet (500 mg total) by mouth 2 (two) times daily with a meal.   insulin  glargine (LANTUS  SOLOSTAR) 100 UNIT/ML Solostar Pen Inject 50 Units into the skin at bedtime.   metFORMIN  (GLUCOPHAGE ) 500 MG tablet Take 1 tablet (500 mg total) by mouth 2 (two) times daily with a meal.   No facility-administered encounter medications on file as of 04/17/2024.   ALLERGIES: Allergies  Allergen Reactions   Januvia  [Sitagliptin ] Nausea And Vomiting   Poison Oak Extract [Poison Oak Extract] Dermatitis   VACCINATION STATUS: Immunization History  Administered Date(s) Administered   Influenza Whole 03/18/2010, 04/13/2011   Influenza,inj,Quad PF,6+ Mos 05/20/2013, 05/08/2014, 06/11/2015, 04/18/2017, 05/23/2018, 03/18/2019, 02/12/2020, 05/07/2021, 04/07/2022   Janssen (J&J) SARS-COV-2 Vaccination 11/02/2019   PNEUMOCOCCAL CONJUGATE-20 05/07/2021   Pfizer Covid-19 Vaccine Bivalent Booster 44yrs & up 06/21/2021   Pneumococcal Polysaccharide-23 11/26/2013, 03/18/2019   Td 09/15/2009, 01/09/2023   Zoster Recombinant(Shingrix) 06/21/2021, 01/09/2023    Diabetes He presents for his follow-up diabetic visit. He has type 2 diabetes mellitus. Onset time: He was diagnosed at age 25. His disease course has been improving. There are no hypoglycemic associated symptoms. Pertinent negatives for hypoglycemia include no seizures. Associated symptoms include fatigue. Pertinent negatives for diabetes include no polydipsia, no polyuria and no visual change. There are no hypoglycemic complications. Symptoms are improving. Diabetic complications include nephropathy. Risk  factors for coronary artery disease include dyslipidemia, diabetes mellitus, hypertension, male sex, obesity and sedentary lifestyle. Current diabetic treatment includes insulin  injections and oral agent (monotherapy). He is compliant with treatment most of the time. His weight is fluctuating minimally. He is following a generally healthy (admits to drinking sweet tea at times) diet. When asked about meal planning, he reported none. He has not had a previous visit with a dietitian. He participates in exercise three times a week. His overall blood glucose range is >200 mg/dl. (He presents today with his CGM showing gross hyperglycemia overall.  His POCT A1c today is 9.7%, increasing from last visit of 7.9%.  He notes his depression is getting the better of him.  He does not have much appetite but knows he needs to eat to survive.  Analysis of his CGM shows TIR 22%, TAR 78%, TBR 0% with a GMI of 8.8%.) An ACE inhibitor/angiotensin II receptor blocker is being taken. He does not see a podiatrist.Eye  exam is current.    Review of systems  Constitutional: + stable body weight,  current Body mass index is 28.16 kg/m. , + fatigue, no subjective hyperthermia, no subjective hypothermia Eyes: + blurry vision, no xerophthalmia ENT: no sore throat, no nodules palpated in throat, no dysphagia/odynophagia, no hoarseness Cardiovascular: no chest pain, no shortness of breath, no palpitations, no leg swelling Respiratory: no cough, no shortness of breath Gastrointestinal: no nausea/vomiting/diarrhea Musculoskeletal: no muscle/joint aches Skin: no rashes, no hyperemia Neurological: no tremors, no numbness, no tingling, no dizziness Psychiatric: + depression- says he has been battling this for a year with no improvement, no anxiety   Objective:    BP 126/76 (BP Location: Right Arm, Patient Position: Sitting, Cuff Size: Large)   Pulse 73   Ht 5' 8 (1.727 m)   Wt 185 lb 3.2 oz (84 kg)   BMI 28.16 kg/m   Wt  Readings from Last 3 Encounters:  04/17/24 185 lb 3.2 oz (84 kg)  03/13/24 194 lb 9.6 oz (88.3 kg)  01/05/24 189 lb 1.9 oz (85.8 kg)    BP Readings from Last 3 Encounters:  04/17/24 126/76  03/13/24 128/74  02/09/24 (!) 188/85     Physical Exam- Limited  Constitutional:  Body mass index is 28.16 kg/m. , not in acute distress, depressed state of mind Eyes:  EOMI, no exophthalmos Musculoskeletal: no gross deformities, strength intact in all four extremities, no gross restriction of joint movements Skin:  no rashes, no hyperemia Neurological: no tremor with outstretched hands   CMP     Component Value Date/Time   NA 137 09/20/2023 1407   K 4.5 09/20/2023 1407   CL 99 09/20/2023 1407   CO2 23 09/20/2023 1407   GLUCOSE 186 (H) 09/20/2023 1407   GLUCOSE 314 (H) 04/03/2023 1804   BUN 8 09/20/2023 1407   CREATININE 1.35 (H) 09/20/2023 1407   CREATININE 1.38 (H) 01/07/2020 0729   CALCIUM  9.7 09/20/2023 1407   PROT 7.0 09/20/2023 1407   ALBUMIN 4.3 09/20/2023 1407   AST 23 09/20/2023 1407   ALT 23 09/20/2023 1407   ALKPHOS 113 09/20/2023 1407   BILITOT 0.5 09/20/2023 1407   GFRNONAA 47 (L) 04/03/2023 1804   GFRNONAA 56 (L) 01/07/2020 0729   GFRAA 73 07/03/2020 1038   GFRAA 64 01/07/2020 0729    Lab Results  Component Value Date   HGBA1C 9.7 (A) 04/17/2024   HGBA1C 7.9 (A) 01/02/2024   HGBA1C 10.9 (H) 09/20/2023   MICROALBUR 13.8 03/18/2019   MICROALBUR 5.3 02/06/2018   MICROALBUR 134.8 (H) 12/30/2016    Lipid Panel     Component Value Date/Time   CHOL 123 09/20/2023 1407   TRIG 105 09/20/2023 1407   HDL 35 (L) 09/20/2023 1407   CHOLHDL 3.5 09/20/2023 1407   CHOLHDL 4.2 01/07/2020 0729   VLDL 64 (H) 12/30/2016 0950   LDLCALC 68 09/20/2023 1407   LDLCALC 99 01/07/2020 0729     Assessment & Plan:   1) Diabetes mellitus type 2 in obese [facility])  - Patient has currently uncontrolled symptomatic type 2 DM since  63 years of age.  He presents today with his CGM  showing gross hyperglycemia overall.  His POCT A1c today is 9.7%, increasing from last visit of 7.9%.  He notes his depression is getting the better of him.  He does not have much appetite but knows he needs to eat to survive.  Analysis of his CGM shows TIR 22%, TAR 78%, TBR 0% with  a GMI of 8.8%.   His diabetes is complicated by obesity , prior history of noncompliance/nonadherence, and patient remains at a high risk for more acute and chronic complications of diabetes which include CAD, CVA, CKD, retinopathy, and neuropathy. These are all discussed in detail with the patient.  - Nutritional counseling repeated/built upon at each appointment.  - The patient admits there is a room for improvement in their diet and drink choices. -  Suggestion is made for the patient to avoid simple carbohydrates from their diet including Cakes, Sweet Desserts / Pastries, Ice Cream, Soda (diet and regular), Sweet Tea, Candies, Chips, Cookies, Sweet Pastries, Store Bought Juices, Alcohol in Excess of 1-2 drinks a day, Artificial Sweeteners, Coffee Creamer, and Sugar-free Products. This will help patient to have stable blood glucose profile and potentially avoid unintended weight gain.   - I encouraged the patient to switch to unprocessed or minimally processed complex starch and increased protein intake (animal or plant source), fruits, and vegetables.   - Patient is advised to stick to a routine mealtimes to eat 3 meals a day and avoid unnecessary snacks (to snack only to correct hypoglycemia).  - I have approached patient with the following individualized plan to manage diabetes and patient agrees:   He is advised to increase his Lantus  to 50 units SQ nightly and continue Metforimin 500 mg po twice daily with meals.   We did talk about potentially needing prandial insulin  in the future if we cannot regain control on current regimen.  I did encourage him to reach out to his PCP to discuss his depression as it no  doubt is playing a role on his chronic disease management.  -He is strongly encouraged to monitor glucose at least twice daily (using his CGM), before breakfast and before bed, and call the clinic if he has readings less than 70 or greater than 200 for 3 tests in a row.  He is aware that his insulin  may need further adjustment to avoid hypoglycemia.  He is great candidate for CGM due to hypoglycemia and visual impairment, is advised to continue wearing.  I sent refill request to [facility] for fulfillment.  - Patient will be considered for incretin therapy as appropriate next visit.  2) BP/HTN: His blood pressure is controlled to target.  He is advised to continue meds as prescribed by PCP.   3) Lipids/HPL: His most recent lipid panel from 09/20/23 shows controlled LDL at 68.  He is advised to continue Crestor  40 mg po daily at bedtime.  Side effects and precautions discussed with him.  4) Chronic Care/Health Maintenance: -Patient is on ACEI/ARB and Statin medications and encouraged to continue to follow up with Ophthalmology, Podiatrist at least yearly or according to recommendations, and advised to stay away from smoking. I have recommended yearly flu vaccine and pneumonia vaccination at least every 5 years; moderate intensity exercise for up to 150 minutes weekly; and  sleep for at least 7 hours a day.  - I advised patient to maintain close follow up with Antonetta Rollene BRAVO, MD for primary care needs.     I spent  31  minutes in the care of the patient today including review of labs from CMP, Lipids, Thyroid  Function, Hematology (current and previous including abstractions from other facilities); face-to-face time discussing  his blood glucose readings/logs, discussing hypoglycemia and hyperglycemia episodes and symptoms, medications doses, his options of short and long term treatment based on the latest standards of care / guidelines;  discussion about incorporating lifestyle medicine;  and  documenting the encounter. Risk reduction counseling performed per USPSTF guidelines to reduce obesity and cardiovascular risk factors.     Please refer to Patient Instructions for Blood Glucose Monitoring and Insulin /Medications Dosing Guide  in media tab for additional information. Please  also refer to  Patient Self Inventory in the Media  tab for reviewed elements of pertinent patient history.  Arley MARLA Gearing participated in the discussions, expressed understanding, and voiced agreement with the above plans.  All questions were answered to his satisfaction. he is encouraged to contact clinic should he have any questions or concerns prior to his return visit.    Follow up plan: - Return in about 3 months (around 07/18/2024) for Diabetes F/U with A1c in office, No previsit labs, Bring meter and logs.   Benton Rio, [facility] [address] [place], [place] [address] Phone: [phone] Fax: [phone]  04/17/2024, 8:24 AM

## 2024-04-18 ENCOUNTER — Encounter (HOSPITAL_COMMUNITY): Payer: Self-pay | Admitting: Psychiatry

## 2024-04-18 ENCOUNTER — Telehealth (HOSPITAL_COMMUNITY): Payer: Self-pay | Admitting: *Deleted

## 2024-04-18 NOTE — Telephone Encounter (Signed)
 completed

## 2024-04-18 NOTE — Telephone Encounter (Signed)
 Patient came into office stated he received a note in the mail stating he have to show up for [employer]. Per pt he is not in any shape to be sitting for jury duty. Per pt he needs a letter from provider explaining why he can not sit for jury duty. Per pt, he would like letter if possible by the 5th of November.

## 2024-04-19 NOTE — Telephone Encounter (Signed)
 Patient aware and will be picking up letter on Monday Nov 3rd.

## 2024-04-22 NOTE — Progress Notes (Signed)
 John [name]                                          MRN: [id number]   04/22/2024   The [facility] Quality Team Specialist reviewed this patient medical record for the purposes of chart review for care gap closure. The following were reviewed: chart review for care gap closure-kidney health evaluation for diabetes:eGFR  and uACR.    [facility]BCI Quality Team

## 2024-04-22 NOTE — Telephone Encounter (Signed)
Patient came into office to pick up letter.

## 2024-04-23 ENCOUNTER — Ambulatory Visit (INDEPENDENT_AMBULATORY_CARE_PROVIDER_SITE_OTHER)

## 2024-04-23 VITALS — BP 99/67 | HR 98 | Resp 16 | Ht 68.0 in | Wt 180.0 lb

## 2024-04-23 DIAGNOSIS — R61 Generalized hyperhidrosis: Secondary | ICD-10-CM | POA: Diagnosis not present

## 2024-04-23 DIAGNOSIS — R112 Nausea with vomiting, unspecified: Secondary | ICD-10-CM

## 2024-04-23 MED ORDER — PROMETHAZINE HCL 25 MG PO TABS: 25.0000 mg | ORAL_TABLET | Freq: Three times a day (TID) | ORAL | 0 refills | Status: AC | PRN

## 2024-04-23 NOTE — Progress Notes (Signed)
 Established Patient Office Visit  Subjective   Patient ID: John [name], male    DOB: 04/03/1961  Age: 63 y.o. MRN: [id number]  Chief Complaint  Patient presents with   Vomiting    X 2 days, cold sweats during the night     HPI Discussed the use of AI scribe software for clinical note transcription with the patient, who gave verbal consent to proceed.  History of Present Illness    John [name] is a 63 year old male who presents with night sweats and insomnia.  Night sweats and insomnia - Night sweats present for the past four days, occurring exclusively at night and disrupting sleep - Difficulty sleeping for the past four days - Uncertain if fever is present - No sick contacts in the household  Gastrointestinal symptoms - Nausea and vomiting for the past two days - Unable to eat today - No recent consumption of food outside the home - Maintaining hydration with electrolyte drinks  Diabetes management - Currently taking metformin  and Lantus  for diabetes     Patient Active Problem List   Diagnosis Date Noted   Night sweats 04/28/2024   Nausea and vomiting 04/28/2024   Elevated serum creatinine 03/15/2024   Foot pain 09/20/2023   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/01/2023   GAD (generalized anxiety disorder) 01/01/2023   Non-cardiac chest pain 10/26/2022   Fatigue 10/26/2022   Encounter for Medicare annual examination with abnormal findings 01/11/2022   Tinea corporis 11/04/2021   Normocytic anemia 08/17/2020   Overweight (BMI 25.0-29.9) 03/18/2019   Insomnia 01/17/2013   HYPOGONADISM 09/15/2009   Type 2 diabetes mellitus with hyperglycemia (HCC) 09/15/2009   Mixed hyperlipidemia 08/18/2009   Essential hypertension 08/18/2009      ROS    Objective:     BP 99/67   Pulse 98   Resp 16   Ht 5' 8 (1.727 m)   Wt 180 lb (81.6 kg)   SpO2 98%   BMI 27.37 kg/m  BP Readings from Last 3 Encounters:  04/23/24 99/67  04/17/24  126/76  03/13/24 128/74   Wt Readings from Last 3 Encounters:  04/23/24 180 lb (81.6 kg)  04/17/24 185 lb 3.2 oz (84 kg)  03/13/24 194 lb 9.6 oz (88.3 kg)      Physical Exam Vitals and nursing note reviewed.  Constitutional:      Appearance: Normal appearance.  HENT:     Head: Normocephalic.  Eyes:     Extraocular Movements: Extraocular movements intact.     Pupils: Pupils are equal, round, and reactive to light.  Cardiovascular:     Rate and Rhythm: Normal rate and regular rhythm.  Pulmonary:     Effort: Pulmonary effort is normal.     Breath sounds: Normal breath sounds.  Musculoskeletal:     Cervical back: Normal range of motion and neck supple.  Neurological:     Mental Status: He is alert and oriented to person, place, and time.  Psychiatric:        Mood and Affect: Mood normal.        Thought Content: Thought content normal.         The ASCVD Risk score (Arnett DK, et al., 2019) failed to calculate for the following reasons:   The valid total cholesterol range is 130 to 320 mg/dL    Assessment & Plan:   Problem List Items Addressed This Visit       Digestive   Nausea and  vomiting   Acute nausea and vomiting, likely viral. - Hold metformin . - Prescribed Phenergan  every 8 hours as needed. - Advised increased fluid intake, including electrolyte drinks. - Recommended BRAT diet until symptoms improve. - Ordered labs for kidney function and CBC.      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 Will check labs for further evaluation.       Relevant Orders   CMP14+EGFR (Completed)   CBC with Differential/Platelet (Completed)        No follow-ups on file.    Leita Longs, FNP

## 2024-04-24 LAB — CMP14+EGFR
ALT: 15 IU/L (ref 0–44)
AST: 13 IU/L (ref 0–40)
Albumin: 4.5 g/dL (ref 3.9–4.9)
Alkaline Phosphatase: 119 IU/L (ref 47–123)
BUN/Creatinine Ratio: 11 (ref 10–24)
BUN: 27 mg/dL (ref 8–27)
Bilirubin Total: 0.9 mg/dL (ref 0.0–1.2)
CO2: 23 mmol/L (ref 20–29)
Calcium: 10.3 mg/dL — ABNORMAL HIGH (ref 8.6–10.2)
Chloride: 93 mmol/L — ABNORMAL LOW (ref 96–106)
Creatinine, Ser: 2.47 mg/dL — ABNORMAL HIGH (ref 0.76–1.27)
Globulin, Total: 2.9 g/dL (ref 1.5–4.5)
Glucose: 180 mg/dL — ABNORMAL HIGH (ref 70–99)
Potassium: 4.1 mmol/L (ref 3.5–5.2)
Sodium: 133 mmol/L — ABNORMAL LOW (ref 134–144)
Total Protein: 7.4 g/dL (ref 6.0–8.5)
eGFR: 28 mL/min/1.73 — ABNORMAL LOW (ref >59)

## 2024-04-24 LAB — CBC WITH DIFFERENTIAL/PLATELET
Basophils Absolute: 0.1 x10E3/uL (ref 0.0–0.2)
Basos: 1 %
EOS (ABSOLUTE): 0.2 x10E3/uL (ref 0.0–0.4)
Eos: 2 %
Hematocrit: 36.8 % — ABNORMAL LOW (ref 37.5–51.0)
Hemoglobin: 11.9 g/dL — ABNORMAL LOW (ref 13.0–17.7)
Immature Grans (Abs): 0 x10E3/uL (ref 0.0–0.1)
Immature Granulocytes: 0 %
Lymphocytes Absolute: 3.1 x10E3/uL (ref 0.7–3.1)
Lymphs: 32 %
MCH: 30.9 pg (ref 26.6–33.0)
MCHC: 32.3 g/dL (ref 31.5–35.7)
MCV: 96 fL (ref 79–97)
Monocytes Absolute: 0.9 x10E3/uL (ref 0.1–0.9)
Monocytes: 10 %
Neutrophils Absolute: 5.4 x10E3/uL (ref 1.4–7.0)
Neutrophils: 55 %
Platelets: 324 x10E3/uL (ref 150–450)
RBC: 3.85 x10E6/uL — ABNORMAL LOW (ref 4.14–5.80)
RDW: 12.2 % (ref 11.6–15.4)
WBC: 9.6 x10E3/uL (ref 3.4–10.8)

## 2024-04-28 ENCOUNTER — Ambulatory Visit: Payer: Self-pay

## 2024-04-28 ENCOUNTER — Other Ambulatory Visit: Payer: Self-pay

## 2024-04-28 DIAGNOSIS — E1165 Type 2 diabetes mellitus with hyperglycemia: Secondary | ICD-10-CM

## 2024-04-28 DIAGNOSIS — R112 Nausea with vomiting, unspecified: Secondary | ICD-10-CM | POA: Insufficient documentation

## 2024-04-28 DIAGNOSIS — N289 Disorder of kidney and ureter, unspecified: Secondary | ICD-10-CM

## 2024-04-28 DIAGNOSIS — R61 Generalized hyperhidrosis: Secondary | ICD-10-CM | POA: Insufficient documentation

## 2024-04-28 NOTE — Assessment & Plan Note (Signed)
 Acute nausea and vomiting, likely viral. - Hold metformin . - Prescribed Phenergan  every 8 hours as needed. - Advised increased fluid intake, including electrolyte drinks. - Recommended BRAT diet until symptoms improve. - Ordered labs for kidney function and CBC.

## 2024-04-28 NOTE — Assessment & Plan Note (Addendum)
 Generalized hyperhidrosis Night sweats without fever, possible viral infection. Will check labs for further evaluation.

## 2024-05-10 ENCOUNTER — Encounter: Payer: Self-pay | Admitting: Family Medicine

## 2024-05-10 ENCOUNTER — Ambulatory Visit: Admitting: Family Medicine

## 2024-05-10 VITALS — BP 117/73 | HR 89 | Resp 16 | Ht 68.0 in | Wt 189.1 lb

## 2024-05-10 DIAGNOSIS — I10 Essential (primary) hypertension: Secondary | ICD-10-CM | POA: Diagnosis not present

## 2024-05-10 DIAGNOSIS — E782 Mixed hyperlipidemia: Secondary | ICD-10-CM | POA: Diagnosis not present

## 2024-05-10 DIAGNOSIS — F322 Major depressive disorder, single episode, severe without psychotic features: Secondary | ICD-10-CM

## 2024-05-10 DIAGNOSIS — E1165 Type 2 diabetes mellitus with hyperglycemia: Secondary | ICD-10-CM | POA: Diagnosis not present

## 2024-05-10 DIAGNOSIS — E559 Vitamin D deficiency, unspecified: Secondary | ICD-10-CM | POA: Diagnosis not present

## 2024-05-10 DIAGNOSIS — Z794 Long term (current) use of insulin: Secondary | ICD-10-CM

## 2024-05-10 DIAGNOSIS — Z23 Encounter for immunization: Secondary | ICD-10-CM | POA: Diagnosis not present

## 2024-05-10 NOTE — Assessment & Plan Note (Signed)
 Uncontrolled and manged by endo Reports light headedness and fatigue Diabetes associated with hypertension, hyperlipidemia, and depression  John [name] is reminded of the importance of commitment to daily physical activity for 30 minutes or more, as able and the need to limit carbohydrate intake to 30 to 60 grams per meal to help with blood sugar control.   The need to take medication as prescribed, test blood sugar as directed, and to call between visits if there is a concern that blood sugar is uncontrolled is also discussed.   John [name] is reminded of the importance of daily foot exam, annual eye examination, and good blood sugar, blood pressure and cholesterol control.     Latest Ref Rng & Units 04/23/2024   10:28 AM 04/17/2024    8:13 AM 01/02/2024    3:03 PM 09/20/2023    2:07 PM 05/12/2023   11:42 AM  Diabetic Labs  HbA1c 4.0 - 5.6 %  9.7  7.9  10.9  8.4   Chol 100 - 199 mg/dL    876    HDL >60 mg/dL    35    Calc LDL 0 - 99 mg/dL    68    Triglycerides 0 - 149 mg/dL    894    Creatinine 9.23 - 1.27 mg/dL 7.52    8.64        88/78/7974    8:58 AM 04/23/2024    9:59 AM 04/17/2024    7:59 AM 03/13/2024   11:45 AM 03/13/2024   11:32 AM 02/09/2024    8:33 AM 02/09/2024    8:32 AM  BP/Weight  Systolic BP 117 99 126 128 132 188 184  Diastolic BP 73 67 76 74 76 85 83  Wt. (Lbs) 189.12 180 185.2  194.6    BMI 28.76 kg/m2 27.37 kg/m2 28.16 kg/m2  29.59 kg/m2        Latest Ref Rng & Units 09/20/2023    1:00 PM 06/17/2021   12:00 AM  Foot/eye exam completion dates  Eye Exam No Retinopathy  No Retinopathy      Foot Form Completion  Done      This result is from an external source.    Rept bmp today advised to stop metformin  pending re eval of renal fuction, will communicate with Endo

## 2024-05-10 NOTE — Progress Notes (Signed)
 John [name]     MRN: [id number]      DOB: May 30, 1961  Chief Complaint  Patient presents with   Medical Management of Chronic Issues    Follow up     HPI John [name] is here for follow up and re-evaluation of chronic medical conditions, medication management and review of any available recent lab and radiology data.  Preventive health is updated, specifically  Cancer screening and Immunization.   Questions or concerns regarding consultations or procedures which the PT has had in the interim are  addressed. The PT denies any adverse reactions to current medications since the last visit.  There are no new concerns.  There are no specific complaints   ROS Denies recent fever or chills. Denies sinus pressure, nasal congestion, ear pain or sore throat. Denies chest congestion, productive cough or wheezing. Denies chest pains, palpitations and leg swelling Denies abdominal pain, nausea, vomiting,diarrhea or constipation.   Denies dysuria, frequency, hesitancy or incontinence. Denies joint pain, swelling and limitation in mobility. Denies headaches, seizures, numbness, or tingling.  Denies skin break down or rash.   PE  BP 117/73   Pulse 89   Resp 16   Ht 5' 8 (1.727 m)   Wt 189 lb 1.9 oz (85.8 kg)   SpO2 96%   BMI 28.76 kg/m   Patient alert and oriented and in no cardiopulmonary distress.  HEENT: No facial asymmetry, EOMI,     Neck supple .  Chest: Clear to auscultation bilaterally.  CVS: S1, S2 no murmurs, no S3.Regular rate.  ABD: Soft non tender.   Ext: No edema  MS: Adequate ROM spine, shoulders, hips and knees.  Skin: Intact, no ulcerations or rash noted.  Psych: Good eye contact, normal affect. Memory intact not anxious or depressed appearing.  CNS: CN 2-12 intact, power,  normal throughout.no focal deficits noted.   Assessment & Plan  Essential hypertension DASH diet and commitment to daily physical activity for a minimum of 30 minutes discussed  and encouraged, as a part of hypertension management. The importance of attaining a healthy weight is also discussed. Controlled, no change in medication      05/10/2024    8:58 AM 04/23/2024    9:59 AM 04/17/2024    7:59 AM 03/13/2024   11:45 AM 03/13/2024   11:32 AM 02/09/2024    8:33 AM 02/09/2024    8:32 AM  BP/Weight  Systolic BP 117 99 126 128 132 188 184  Diastolic BP 73 67 76 74 76 85 83  Wt. (Lbs) 189.12 180 185.2  194.6    BMI 28.76 kg/m2 27.37 kg/m2 28.16 kg/m2  29.59 kg/m2         Severe major depression without psychotic features (HCC) Will message Psych so she is aware of condition, reports med compliance, no known trigger for 2 month decline  Type 2 diabetes mellitus with hyperglycemia (HCC) Uncontrolled and manged by endo Reports light headedness and fatigue Diabetes associated with hypertension, hyperlipidemia, and depression  John [name] is reminded of the importance of commitment to daily physical activity for 30 minutes or more, as able and the need to limit carbohydrate intake to 30 to 60 grams per meal to help with blood sugar control.   The need to take medication as prescribed, test blood sugar as directed, and to call between visits if there is a concern that blood sugar is uncontrolled is also discussed.   John [name] is reminded of the importance of daily foot  exam, annual eye examination, and good blood sugar, blood pressure and cholesterol control.     Latest Ref Rng & Units 04/23/2024   10:28 AM 04/17/2024    8:13 AM 01/02/2024    3:03 PM 09/20/2023    2:07 PM 05/12/2023   11:42 AM  Diabetic Labs  HbA1c 4.0 - 5.6 %  9.7  7.9  10.9  8.4   Chol 100 - 199 mg/dL    876    HDL >60 mg/dL    35    Calc LDL 0 - 99 mg/dL    68    Triglycerides 0 - 149 mg/dL    894    Creatinine 9.23 - 1.27 mg/dL 7.52    8.64        88/78/7974    8:58 AM 04/23/2024    9:59 AM 04/17/2024    7:59 AM 03/13/2024   11:45 AM 03/13/2024   11:32 AM 02/09/2024    8:33 AM  02/09/2024    8:32 AM  BP/Weight  Systolic BP 117 99 126 128 132 188 184  Diastolic BP 73 67 76 74 76 85 83  Wt. (Lbs) 189.12 180 185.2  194.6    BMI 28.76 kg/m2 27.37 kg/m2 28.16 kg/m2  29.59 kg/m2        Latest Ref Rng & Units 09/20/2023    1:00 PM 06/17/2021   12:00 AM  Foot/eye exam completion dates  Eye Exam No Retinopathy  No Retinopathy      Foot Form Completion  Done      This result is from an external source.    Rept bmp today advised to stop metformin  pending re eval of renal fuction, will communicate with Endo    Vitamin D  deficiency Updated lab needed at/ before next visit.   Influenza vaccination administered at current visit After obtaining informed consent, the vaccine is  administered , with no adverse effect noted at the time of administration.

## 2024-05-10 NOTE — Assessment & Plan Note (Signed)
 Updated lab needed at/ before next visit.

## 2024-05-10 NOTE — Patient Instructions (Addendum)
 F/U in mid to end January Annual exam 4/3 or shortly after  Lipid, bmp andEGFr, tSH , Vit D and urine ACR today   You are  being referred to kidney Specialist if kidney function remains  not good, pls listen for call on Monday re labs  STOP metformin  until you hear from [facility]  Flu vaccine today  You will be referred for eye exam for March 2026, need blood sugar controlled  Goal for fasting blood sugar ranges from 100 to 130 and 2 hours after any meal or at bedtime should be between 140 to 180.    Stop phenergan  since you do not have much nausea  It is important that you exercise regularly at least 30 minutes 5 times a week. If you develop chest pain, have severe difficulty breathing, or feel very tired, stop exercising immediately and seek medical attention   Thanks for choosing [facility], we consider it a privelige to serve you.

## 2024-05-10 NOTE — Assessment & Plan Note (Signed)
 After obtaining informed consent, the vaccine is  administered , with no adverse effect noted at the time of administration.

## 2024-05-10 NOTE — Assessment & Plan Note (Signed)
 Will message Psych so she is aware of condition, reports med compliance, no known trigger for 2 month decline

## 2024-05-10 NOTE — Assessment & Plan Note (Signed)
 DASH diet and commitment to daily physical activity for a minimum of 30 minutes discussed and encouraged, as a part of hypertension management. The importance of attaining a healthy weight is also discussed. Controlled, no change in medication      05/10/2024    8:58 AM 04/23/2024    9:59 AM 04/17/2024    7:59 AM 03/13/2024   11:45 AM 03/13/2024   11:32 AM 02/09/2024    8:33 AM 02/09/2024    8:32 AM  BP/Weight  Systolic BP 117 99 126 128 132 188 184  Diastolic BP 73 67 76 74 76 85 83  Wt. (Lbs) 189.12 180 185.2  194.6    BMI 28.76 kg/m2 27.37 kg/m2 28.16 kg/m2  29.59 kg/m2

## 2024-05-12 LAB — BMP8+EGFR
BUN/Creatinine Ratio: 10 (ref 10–24)
BUN: 15 mg/dL (ref 8–27)
CO2: 24 mmol/L (ref 20–29)
Calcium: 10.3 mg/dL — ABNORMAL HIGH (ref 8.6–10.2)
Chloride: 100 mmol/L (ref 96–106)
Creatinine, Ser: 1.57 mg/dL — ABNORMAL HIGH (ref 0.76–1.27)
Glucose: 141 mg/dL — ABNORMAL HIGH (ref 70–99)
Potassium: 4.6 mmol/L (ref 3.5–5.2)
Sodium: 140 mmol/L (ref 134–144)
eGFR: 49 mL/min/1.73 — ABNORMAL LOW (ref >59)

## 2024-05-12 LAB — LIPID PANEL
Chol/HDL Ratio: 3.8 ratio (ref 0.0–5.0)
Cholesterol, Total: 190 mg/dL (ref 100–199)
HDL: 50 mg/dL (ref >39)
LDL Chol Calc (NIH): 122 mg/dL — ABNORMAL HIGH (ref 0–99)
Triglycerides: 97 mg/dL (ref 0–149)
VLDL Cholesterol Cal: 18 mg/dL (ref 5–40)

## 2024-05-12 LAB — MICROALBUMIN / CREATININE URINE RATIO
Creatinine, Urine: 166.9 mg/dL
Microalb/Creat Ratio: 97 mg/g{creat} — AB (ref 0–29)
Microalbumin, Urine: 162.2 ug/mL

## 2024-05-12 LAB — TSH: TSH: 4.48 u[IU]/mL (ref 0.450–4.500)

## 2024-05-12 LAB — VITAMIN D 25 HYDROXY (VIT D DEFICIENCY, FRACTURES): Vit D, 25-Hydroxy: 37.2 ng/mL (ref 30.0–100.0)

## 2024-05-14 ENCOUNTER — Telehealth: Payer: Self-pay

## 2024-05-14 NOTE — Telephone Encounter (Signed)
 Copied from CRM #[id number]. Topic: Clinical - Prescription Issue >> May 14, 2024  1:13 PM Berwyn MATSU wrote: Reason for CRM:  Patients wife Gaetana [phone] called in to advise that on visit 05/10/24 MD advised that patient needed cholesterol medication but none were called in. Patients wife is calling to confirm if medication will be sent or not.   May you please advise.

## 2024-05-17 MED ORDER — ROSUVASTATIN CALCIUM 40 MG PO TABS: 40.0000 mg | ORAL_TABLET | Freq: Every day | ORAL | 1 refills | Status: AC

## 2024-05-17 NOTE — Addendum Note (Signed)
 Addended by: ANTONETTA ROLLENE BRAVO on: 05/17/2024 02:36 PM   Modules accepted: Orders

## 2024-05-17 NOTE — Telephone Encounter (Signed)
 I am prescribing rosuvastatin  40 mg again, when he came he had none with him, but had a 90 day  supply up to mid October called in and his lDL is still high Pls let her know, I am sending in 6 months total

## 2024-05-20 ENCOUNTER — Ambulatory Visit: Payer: Self-pay | Admitting: Family Medicine

## 2024-05-20 DIAGNOSIS — N1831 Chronic kidney disease, stage 3a: Secondary | ICD-10-CM

## 2024-05-20 NOTE — Telephone Encounter (Signed)
 Call could not be completed.

## 2024-05-22 ENCOUNTER — Encounter (HOSPITAL_COMMUNITY): Payer: Self-pay | Admitting: Psychiatry

## 2024-05-22 ENCOUNTER — Telehealth (INDEPENDENT_AMBULATORY_CARE_PROVIDER_SITE_OTHER): Admitting: Psychiatry

## 2024-05-22 DIAGNOSIS — F322 Major depressive disorder, single episode, severe without psychotic features: Secondary | ICD-10-CM

## 2024-05-22 MED ORDER — ALPRAZOLAM 1 MG PO TABS: 1.0000 mg | ORAL_TABLET | Freq: Two times a day (BID) | ORAL | 2 refills | Status: AC

## 2024-05-22 MED ORDER — TRAZODONE HCL 100 MG PO TABS: 200.0000 mg | ORAL_TABLET | Freq: Every day | ORAL | 2 refills | Status: AC

## 2024-05-22 MED ORDER — DESVENLAFAXINE SUCCINATE ER 50 MG PO TB24: 50.0000 mg | ORAL_TABLET | Freq: Every day | ORAL | 3 refills | Status: AC

## 2024-05-22 NOTE — Progress Notes (Signed)
 Virtual Visit via Telephone Note  I connected with John [name] on 05/22/24 at  9:20 AM EST by telephone and verified that I am speaking with the correct person using two identifiers.  Location: Patient: home Provider: office   I discussed the limitations, risks, security and privacy concerns of performing an evaluation and management service by telephone and the availability of in person appointments. I also discussed with the patient that there may be a patient responsible charge related to this service. The patient expressed understanding and agreed to proceed.      I discussed the assessment and treatment plan with the patient. The patient was provided an opportunity to ask questions and all were answered. The patient agreed with the plan and demonstrated an understanding of the instructions.   The patient was advised to call back or seek an in-person evaluation if the symptoms worsen or if the condition fails to improve as anticipated.  I provided 20 minutes of non-face-to-face time during this encounter.   Barnie Gull, MD  [facility] MD/PA/NP OP Progress Note  05/22/2024 9:44 AM John [name]  MRN:  [id number]  Chief Complaint:  Chief Complaint  Patient presents with   Depression   Anxiety   Follow-up   HPI: This patient is a 63 year old black male who is living with his wife in [place]. He has 1 son who died at age 47 in 32. He is on disability.   The patient returns for follow-up after 2 months regarding his major depression and generalized anxiety.  He again states that his depression is worse.  Last time we had changed his Prozac  to Wellbutrin .  He states that this is not helping.  He feels sad all day and does not have any energy or motivation.  He takes trazodone  150 mg at bedtime but is still waking up in the middle of the night and cannot go back to sleep.  He denies any thoughts of self-harm or suicide.  He states that he has bad thoughts but cannot describe  what they are.  He denies any hallucinations.  I suggested that we try something else such as Pristiq which is an SNRI and he is willing to try this.  We can also increase the trazodone . Visit Diagnosis:    ICD-10-CM   1. Major depressive disorder, single episode, severe without psychotic features (HCC)  F32.2 ALPRAZolam  (XANAX ) 1 MG tablet      Past Psychiatric History: none  Past Medical History:  Past Medical History:  Diagnosis Date   Anxiety    Depression 2007   Diabetes mellitus without complication (HCC)    Helicobacter pylori ab+ 01/21/2019   Dx in 12/2018, will treat   Hyperlipidemia    Hypertension    Hypogonadism male    Nausea and vomiting 04/28/2024   Obesity (BMI 30.0-34.9) 03/01/2017    Past Surgical History:  Procedure Laterality Date   CIRCUMCISION N/A 06/10/2019   Procedure: CIRCUMCISION ADULT;  Surgeon: Sherrilee Belvie CROME, MD;  Location: [facility]S;  Service: Urology;  Laterality: N/A;  30 MINS   COLONOSCOPY  01/20/2011   Dr. Harvey: large internal hemorrhoids   COLONOSCOPY WITH PROPOFOL  N/A 09/21/2020   Procedure: COLONOSCOPY WITH PROPOFOL ;  Surgeon: Cindie Carlin MARLA, DO;  Location: [facility] END[facility] SUITE;  Service: Endoscopy;  Laterality: N/A;  AM-diabetic   HERNIA REPAIR     ventral hernia repair    VASECTOMY      Family Psychiatric History: See below  Family History:  Family  History  Problem Relation Age of Onset   Heart failure Mother        living    Hypertension Mother    Hyperlipidemia Mother    Heart disease Mother    Hypertension Sister    Hypertension Brother    Colon cancer Neg Hx     Social History:  Social History   Socioeconomic History   Marital status: Married    Spouse name: Not on file   Number of children: 1   Years of education: Not on file   Highest education level: Not on file  Occupational History   Occupation: dispensing optician  Tobacco Use   Smoking status: Never   Smokeless tobacco: Never  Vaping Use   Vaping status: Never  Used  Substance and Sexual Activity   Alcohol use: Yes    Alcohol/week: 1.0 standard drink of alcohol    Types: 1 Cans of beer per week    Comment: occasional beer socially.    Drug use: No   Sexual activity: Yes  Other Topics Concern   Not on file  Social History Narrative   Not on file   Social Drivers of Health   Financial Resource Strain: Low Risk  (10/09/2023)   Overall Financial Resource Strain (CARDIA)    Difficulty of Paying Living Expenses: Not hard at all  Food Insecurity: No Food Insecurity (10/09/2023)   Hunger Vital Sign    Worried About Running Out of Food in the Last Year: Never true    Ran Out of Food in the Last Year: Never true  Transportation Needs: No Transportation Needs (10/09/2023)   PRAPARE - Administrator, Civil Service (Medical): No    Lack of Transportation (Non-Medical): No  Physical Activity: Sufficiently Active (10/09/2023)   Exercise Vital Sign    Days of Exercise per Week: 7 days    Minutes of Exercise per Session: 30 min  Stress: No Stress Concern Present (10/09/2023)   [employer] of Occupational Health - Occupational Stress Questionnaire    Feeling of Stress : Only a little  Social Connections: Moderately Isolated (10/09/2023)   Social Connection and Isolation Panel    Frequency of Communication with Friends and Family: Once a week    Frequency of Social Gatherings with Friends and Family: Never    Attends Religious Services: More than 4 times per year    Active Member of [employer] or Organizations: No    Attends Banker Meetings: Never    Marital Status: Married    Allergies:  Allergies  Allergen Reactions   Januvia  [Sitagliptin ] Nausea And Vomiting   Poison Oak Extract [Poison Oak Extract] Dermatitis    Metabolic Disorder Labs: Lab Results  Component Value Date   HGBA1C 9.7 (A) 04/17/2024   MPG 114.02 06/10/2019   MPG 140 02/26/2019   No results found for: PROLACTIN Lab Results  Component Value Date    CHOL 190 05/10/2024   TRIG 97 05/10/2024   HDL 50 05/10/2024   CHOLHDL 3.8 05/10/2024   VLDL 64 (H) 12/30/2016   LDLCALC 122 (H) 05/10/2024   LDLCALC 68 09/20/2023   Lab Results  Component Value Date   TSH 4.480 05/10/2024   TSH 1.830 11/04/2021    Therapeutic Level Labs: No results found for: LITHIUM No results found for: VALPROATE No results found for: CBMZ  Current Medications: Current Outpatient Medications  Medication Sig Dispense Refill   desvenlafaxine (PRISTIQ) 50 MG 24 hr tablet Take 1 tablet (50  mg total) by mouth daily. 30 tablet 3   traZODone  (DESYREL ) 100 MG tablet Take 2 tablets (200 mg total) by mouth at bedtime. 60 tablet 2   Accu-Chek FastClix Lancets MISC Use to monitor glucose twice daily 102 each 6   ALPRAZolam  (XANAX ) 1 MG tablet Take 1 tablet (1 mg total) by mouth 2 (two) times daily. 60 tablet 2   aspirin  EC 81 MG tablet Take 81 mg by mouth daily as needed. Swallow whole.     Blood Glucose Monitoring Suppl (ACCU-CHEK GUIDE ME) w/Device KIT 1 Device by Does not apply route 4 (four) times daily. 1 kit 0   glucose blood (ACCU-CHEK GUIDE) test strip Use as instructed 100 each 12   insulin  glargine (LANTUS  SOLOSTAR) 100 UNIT/ML Solostar Pen Inject 50 Units into the skin at bedtime. 45 mL 3   Insulin  Pen Needle (B-D ULTRAFINE III SHORT PEN) 31G X 8 MM MISC Use to inject insulin  once daily 100 each 3   metFORMIN  (GLUCOPHAGE ) 500 MG tablet Take 1 tablet (500 mg total) by mouth 2 (two) times daily with a meal. 180 tablet 3   olmesartan -hydrochlorothiazide  (BENICAR  HCT) 40-12.5 MG tablet Take 1 tablet by mouth daily. 30 tablet 3   promethazine  (PHENERGAN ) 25 MG tablet Take 1 tablet (25 mg total) by mouth every 8 (eight) hours as needed for nausea or vomiting. 20 tablet 0   rosuvastatin  (CRESTOR ) 40 MG tablet Take 1 tablet (40 mg total) by mouth daily. 90 tablet 1   No current facility-administered medications for this visit.     Musculoskeletal: Strength  & Muscle Tone: na Gait & Station: na Patient leans: N/A  Psychiatric Specialty Exam: Review of Systems  Psychiatric/Behavioral:  Positive for dysphoric mood and sleep disturbance.   All other systems reviewed and are negative.   There were no vitals taken for this visit.There is no height or weight on file to calculate BMI.  General Appearance: NA  Eye Contact:  NA  Speech:  Clear and Coherent  Volume:  Normal  Mood:  Depressed  Affect:  NA  Thought Process:  Goal Directed  Orientation:  Full (Time, Place, and Person)  Thought Content: Rumination   Suicidal Thoughts:  No  Homicidal Thoughts:  No  Memory:  Immediate;   Good Recent;   Fair Remote;   NA  Judgement:  Fair  Insight:  Shallow  Psychomotor Activity:  Decreased  Concentration:  Concentration: Fair and Attention Span: Fair  Recall:  [employer] of Knowledge: Fair  Language: Good  Akathisia:  No  Handed:  Right  AIMS (if indicated): not done  Assets:  Communication Skills Desire for Improvement Resilience Social Support  ADL's:  Intact  Cognition: Impaired,  Mild  Sleep:  Poor   Screenings: GAD-7    Flowsheet Row Office Visit from 05/10/2024 in [facility] Office Visit from 03/13/2024 in [facility] Office Visit from 01/05/2024 in [facility] Office Visit from 11/24/2023 in [facility] Office Visit from 09/20/2023 in [facility]  Total GAD-7 Score 0 0 7 7 7    PHQ2-9    Flowsheet Row Office Visit from 05/10/2024 in [facility] Office Visit from 03/13/2024 in [facility] Office Visit from 01/05/2024 in [facility] Office Visit from 11/24/2023 in [facility] Clinical Support from 10/09/2023 in [facility]  PHQ-2 Total Score 6 0 2 0 2  PHQ-9 Total Score 20 0 13 2 12    Flowsheet Row ED  from 04/03/2023 in [facility] Emergency Department at [facility] ED from 10/25/2022 in [facility] Emergency Department at [facility] Video Visit from 03/15/2022 in [facility] [place]  C-SSRS RISK CATEGORY No Risk No Risk No Risk     Assessment and Plan: This patient is a 63 year old male with a history of major depression generalized anxiety disorder and mild cognitive impairment.  He is still not doing well in terms of the depression so we will discontinue Wellbutrin  in favor of Pristiq 50 mg every morning.  We will also increase trazodone  to 200 mg at bedtime for sleep.  He will continue Xanax  1 mg twice daily for anxiety.  He will return to see me in 4 weeks  Collaboration of Care: Collaboration of Care: Primary Care Provider AEB notes are shared with PCP on the epic system  Patient/Guardian was advised Release of Information must be obtained prior to any record release in order to collaborate their care with an outside provider. Patient/Guardian was advised if they have not already done so to contact the registration department to sign all necessary forms in order for us  to release information regarding their care.   Consent: Patient/Guardian gives verbal consent for treatment and assignment of benefits for services provided during this visit. Patient/Guardian expressed understanding and agreed to proceed.    Barnie Gull, MD 05/22/2024, 9:44 AM

## 2024-06-03 NOTE — Progress Notes (Addendum)
 EDMAN LIPSEY                                          MRN: [id number]   06/03/2024   The [facility] Quality Team Specialist reviewed this patient medical record for the purposes of chart review for care gap closure. The following were reviewed: chart review for care gap closure-glycemic status assessment. Rechecked A1C labs.    [facility]BCI Quality Team

## 2024-06-11 ENCOUNTER — Telehealth: Payer: Self-pay | Admitting: *Deleted

## 2024-06-11 NOTE — Telephone Encounter (Signed)
 Patient presented to the office. He shared that he has no Lantus , cannot afford it right now. Patient was given application to complete for assistance (Lantus ). He was also given a sample of the Toujeo  , he was advised to inject 50 units at bedtime, same as the Lantus . He was also given appointment for February and encouraged to keep that appointment. Patient will being the application back when he has completed his portion and [facility] will complete provider part and fax it for the patient.

## 2024-06-24 ENCOUNTER — Other Ambulatory Visit: Payer: Self-pay | Admitting: Family Medicine

## 2024-07-03 NOTE — Telephone Encounter (Signed)
 PAP for Lantus  placed in providers room to be reviewed and signed.

## 2024-07-03 NOTE — Telephone Encounter (Signed)
 Pt brought his ppw by. Anton is working on that.

## 2024-07-03 NOTE — Telephone Encounter (Signed)
 Talked with [facility] and she gave permission to give the patient samples of the Lantus . Patient was made aware that the PA would be completed ans once we heard anything we  would let him know. Patient was encouraged to keep his upcoming appointment.

## 2024-07-10 ENCOUNTER — Other Ambulatory Visit (HOSPITAL_COMMUNITY): Payer: Self-pay | Admitting: Nephrology

## 2024-07-10 ENCOUNTER — Encounter: Payer: Self-pay | Admitting: *Deleted

## 2024-07-10 DIAGNOSIS — I129 Hypertensive chronic kidney disease with stage 1 through stage 4 chronic kidney disease, or unspecified chronic kidney disease: Secondary | ICD-10-CM

## 2024-07-10 DIAGNOSIS — D638 Anemia in other chronic diseases classified elsewhere: Secondary | ICD-10-CM

## 2024-07-10 DIAGNOSIS — R809 Proteinuria, unspecified: Secondary | ICD-10-CM

## 2024-07-10 DIAGNOSIS — N1831 Chronic kidney disease, stage 3a: Secondary | ICD-10-CM

## 2024-07-16 ENCOUNTER — Other Ambulatory Visit: Payer: Self-pay | Admitting: *Deleted

## 2024-07-16 DIAGNOSIS — Z794 Long term (current) use of insulin: Secondary | ICD-10-CM

## 2024-07-16 DIAGNOSIS — Z7984 Long term (current) use of oral hypoglycemic drugs: Secondary | ICD-10-CM

## 2024-07-16 DIAGNOSIS — E782 Mixed hyperlipidemia: Secondary | ICD-10-CM

## 2024-07-16 DIAGNOSIS — E1165 Type 2 diabetes mellitus with hyperglycemia: Secondary | ICD-10-CM

## 2024-07-16 MED ORDER — LANTUS SOLOSTAR 100 UNIT/ML ~~LOC~~ SOPN: 50.0000 [IU] | PEN_INJECTOR | Freq: Every day | SUBCUTANEOUS | 3 refills | Status: DC

## 2024-07-17 ENCOUNTER — Ambulatory Visit (HOSPITAL_COMMUNITY)
Admission: RE | Admit: 2024-07-17 | Discharge: 2024-07-17 | Disposition: A | Discharge status: Home / Self Care | Source: Ambulatory Visit | Attending: Nephrology | Admitting: Nephrology

## 2024-07-17 DIAGNOSIS — R809 Proteinuria, unspecified: Secondary | ICD-10-CM | POA: Insufficient documentation

## 2024-07-17 DIAGNOSIS — N1831 Chronic kidney disease, stage 3a: Secondary | ICD-10-CM | POA: Insufficient documentation

## 2024-07-17 DIAGNOSIS — D638 Anemia in other chronic diseases classified elsewhere: Secondary | ICD-10-CM | POA: Insufficient documentation

## 2024-07-17 DIAGNOSIS — I129 Hypertensive chronic kidney disease with stage 1 through stage 4 chronic kidney disease, or unspecified chronic kidney disease: Secondary | ICD-10-CM | POA: Diagnosis present

## 2024-07-19 ENCOUNTER — Encounter: Payer: Self-pay | Admitting: *Deleted

## 2024-07-19 NOTE — Progress Notes (Signed)
 John [name]                                          MRN: [id number]   07/19/2024   The [facility]BCI Quality Team Specialist reviewed this patient medical record for the purposes of chart review for care gap closure. The following were reviewed: chart review for care gap closure-glycemic status assessment.    [facility]BCI Quality Team

## 2024-07-23 ENCOUNTER — Ambulatory Visit: Admitting: Nurse Practitioner

## 2024-07-23 ENCOUNTER — Encounter: Payer: Self-pay | Admitting: Nurse Practitioner

## 2024-07-23 VITALS — BP 128/82 | HR 89 | Ht 68.0 in | Wt 196.4 lb

## 2024-07-23 DIAGNOSIS — Z7984 Long term (current) use of oral hypoglycemic drugs: Secondary | ICD-10-CM

## 2024-07-23 DIAGNOSIS — E782 Mixed hyperlipidemia: Secondary | ICD-10-CM | POA: Diagnosis not present

## 2024-07-23 DIAGNOSIS — E1165 Type 2 diabetes mellitus with hyperglycemia: Secondary | ICD-10-CM | POA: Diagnosis not present

## 2024-07-23 DIAGNOSIS — Z794 Long term (current) use of insulin: Secondary | ICD-10-CM

## 2024-07-23 LAB — POCT GLYCOSYLATED HEMOGLOBIN (HGB A1C): Hemoglobin A1C: 10 % — AB (ref 4.0–5.6)

## 2024-07-23 MED ORDER — PEN NEEDLES 31G X 8 MM MISC: 3 refills | Status: AC

## 2024-07-23 MED ORDER — NOVOLOG FLEXPEN 100 UNIT/ML ~~LOC~~ SOPN: 6.0000 [IU] | PEN_INJECTOR | Freq: Three times a day (TID) | SUBCUTANEOUS | 3 refills | Status: AC

## 2024-07-23 MED ORDER — LANTUS SOLOSTAR 100 UNIT/ML ~~LOC~~ SOPN: 50.0000 [IU] | PEN_INJECTOR | Freq: Every day | SUBCUTANEOUS | 3 refills | Status: AC

## 2024-07-25 ENCOUNTER — Telehealth: Payer: Self-pay | Admitting: *Deleted

## 2024-07-25 NOTE — Telephone Encounter (Signed)
 Did he say how much it was?  It should be capped at $35 via Medicare.

## 2024-07-25 NOTE — Telephone Encounter (Signed)
 Called [facility] to inquire, I was told that his copay is $35. When I ask about the $35 cap. Majestic, the young lady I talked to said that patient would need to call his insurance company. Patient was told that it would be $104.  Will call him back and ask that he reach out to his insurance.

## 2024-07-25 NOTE — Telephone Encounter (Signed)
 Patient was called and made aware. He plans to call then share with us  what he finds out.

## 2024-07-25 NOTE — Telephone Encounter (Signed)
 Ok let me know what he says.  We certainly need some prandial insulin .

## 2024-07-25 NOTE — Telephone Encounter (Signed)
 Patient called and states that he wanted to make Whitney aware that he will not be able to afford the insulin  prescription that was sent to [facility].

## 2024-09-20 ENCOUNTER — Encounter: Admitting: Family Medicine

## 2024-10-09 ENCOUNTER — Ambulatory Visit

## 2024-10-23 ENCOUNTER — Ambulatory Visit: Admitting: Nurse Practitioner
